# Patient Record
Sex: Female | Born: 1980
Health system: Southern US, Community
[De-identification: ages and names within clinical notes are randomized; demographics above are authoritative.]

## PROBLEM LIST (undated history)

## (undated) ENCOUNTER — Inpatient Hospital Stay (HOSPITAL_COMMUNITY): Payer: Self-pay

## (undated) DIAGNOSIS — J302 Other seasonal allergic rhinitis: Secondary | ICD-10-CM

## (undated) DIAGNOSIS — K219 Gastro-esophageal reflux disease without esophagitis: Secondary | ICD-10-CM

## (undated) HISTORY — PX: FRACTURE SURGERY: SHX138

## (undated) HISTORY — DX: Other seasonal allergic rhinitis: J30.2

---
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
## 2001-02-16 ENCOUNTER — Emergency Department (HOSPITAL_COMMUNITY): Admission: EM | Admit: 2001-02-16 | Discharge: 2001-02-16 | Payer: Self-pay | Admitting: *Deleted

## 2001-07-12 ENCOUNTER — Emergency Department (HOSPITAL_COMMUNITY): Admission: EM | Admit: 2001-07-12 | Discharge: 2001-07-12 | Payer: Self-pay | Admitting: Emergency Medicine

## 2001-09-29 ENCOUNTER — Emergency Department (HOSPITAL_COMMUNITY): Admission: EM | Admit: 2001-09-29 | Discharge: 2001-09-29 | Payer: Self-pay | Admitting: Emergency Medicine

## 2001-10-02 ENCOUNTER — Other Ambulatory Visit: Admission: RE | Admit: 2001-10-02 | Discharge: 2001-10-02 | Payer: Self-pay | Admitting: Family Medicine

## 2001-10-03 ENCOUNTER — Encounter: Admission: RE | Admit: 2001-10-03 | Discharge: 2001-10-03 | Payer: Self-pay | Admitting: Family Medicine

## 2001-11-11 ENCOUNTER — Emergency Department (HOSPITAL_COMMUNITY): Admission: EM | Admit: 2001-11-11 | Discharge: 2001-11-11 | Payer: Self-pay | Admitting: *Deleted

## 2001-11-19 ENCOUNTER — Emergency Department (HOSPITAL_COMMUNITY): Admission: EM | Admit: 2001-11-19 | Discharge: 2001-11-19 | Payer: Self-pay | Admitting: *Deleted

## 2001-11-23 ENCOUNTER — Observation Stay: Admission: EM | Admit: 2001-11-23 | Discharge: 2001-11-24 | Payer: Self-pay | Admitting: Emergency Medicine

## 2001-11-24 ENCOUNTER — Encounter: Payer: Self-pay | Admitting: Emergency Medicine

## 2001-11-27 ENCOUNTER — Encounter: Admission: RE | Admit: 2001-11-27 | Discharge: 2001-11-27 | Payer: Self-pay | Admitting: Family Medicine

## 2002-01-04 ENCOUNTER — Emergency Department (HOSPITAL_COMMUNITY): Admission: EM | Admit: 2002-01-04 | Discharge: 2002-01-04 | Payer: Self-pay | Admitting: Emergency Medicine

## 2002-01-06 ENCOUNTER — Emergency Department (HOSPITAL_COMMUNITY): Admission: EM | Admit: 2002-01-06 | Discharge: 2002-01-06 | Payer: Self-pay | Admitting: Emergency Medicine

## 2002-01-07 ENCOUNTER — Encounter: Payer: Self-pay | Admitting: Emergency Medicine

## 2002-01-07 ENCOUNTER — Observation Stay (HOSPITAL_COMMUNITY): Admission: EM | Admit: 2002-01-07 | Discharge: 2002-01-07 | Payer: Self-pay | Admitting: Emergency Medicine

## 2002-01-29 ENCOUNTER — Encounter: Admission: RE | Admit: 2002-01-29 | Discharge: 2002-01-29 | Payer: Self-pay | Admitting: Family Medicine

## 2002-02-20 ENCOUNTER — Encounter: Admission: RE | Admit: 2002-02-20 | Discharge: 2002-02-20 | Payer: Self-pay | Admitting: Family Medicine

## 2002-04-10 ENCOUNTER — Encounter: Payer: Self-pay | Admitting: Emergency Medicine

## 2002-04-10 ENCOUNTER — Emergency Department (HOSPITAL_COMMUNITY): Admission: EM | Admit: 2002-04-10 | Discharge: 2002-04-11 | Payer: Self-pay | Admitting: Emergency Medicine

## 2002-05-18 ENCOUNTER — Emergency Department (HOSPITAL_COMMUNITY): Admission: EM | Admit: 2002-05-18 | Discharge: 2002-05-18 | Payer: Self-pay | Admitting: Emergency Medicine

## 2002-06-07 ENCOUNTER — Emergency Department (HOSPITAL_COMMUNITY): Admission: EM | Admit: 2002-06-07 | Discharge: 2002-06-08 | Payer: Self-pay | Admitting: Emergency Medicine

## 2002-06-11 ENCOUNTER — Emergency Department (HOSPITAL_COMMUNITY): Admission: EM | Admit: 2002-06-11 | Discharge: 2002-06-11 | Payer: Self-pay | Admitting: Emergency Medicine

## 2002-06-15 ENCOUNTER — Inpatient Hospital Stay (HOSPITAL_COMMUNITY): Admission: EM | Admit: 2002-06-15 | Discharge: 2002-06-16 | Payer: Self-pay | Admitting: Emergency Medicine

## 2002-06-15 ENCOUNTER — Encounter: Payer: Self-pay | Admitting: Emergency Medicine

## 2002-06-22 ENCOUNTER — Encounter: Admission: RE | Admit: 2002-06-22 | Discharge: 2002-06-22 | Payer: Self-pay | Admitting: Sports Medicine

## 2002-07-23 ENCOUNTER — Emergency Department (HOSPITAL_COMMUNITY): Admission: AD | Admit: 2002-07-23 | Discharge: 2002-07-23 | Payer: Self-pay | Admitting: Emergency Medicine

## 2002-08-17 ENCOUNTER — Ambulatory Visit (HOSPITAL_COMMUNITY): Admission: RE | Admit: 2002-08-17 | Discharge: 2002-08-17 | Payer: Self-pay | Admitting: *Deleted

## 2002-08-24 ENCOUNTER — Encounter: Admission: RE | Admit: 2002-08-24 | Discharge: 2002-08-24 | Payer: Self-pay | Admitting: Sports Medicine

## 2002-09-19 ENCOUNTER — Encounter: Payer: Self-pay | Admitting: Emergency Medicine

## 2002-09-19 ENCOUNTER — Inpatient Hospital Stay (HOSPITAL_COMMUNITY): Admission: EM | Admit: 2002-09-19 | Discharge: 2002-09-22 | Payer: Self-pay | Admitting: Emergency Medicine

## 2002-09-20 ENCOUNTER — Encounter: Payer: Self-pay | Admitting: Family Medicine

## 2002-10-06 ENCOUNTER — Encounter: Admission: RE | Admit: 2002-10-06 | Discharge: 2002-10-06 | Payer: Self-pay | Admitting: Family Medicine

## 2002-10-12 ENCOUNTER — Encounter: Admission: RE | Admit: 2002-10-12 | Discharge: 2002-10-12 | Payer: Self-pay | Admitting: Family Medicine

## 2002-10-19 ENCOUNTER — Encounter: Admission: RE | Admit: 2002-10-19 | Discharge: 2002-10-19 | Payer: Self-pay | Admitting: Family Medicine

## 2002-11-07 ENCOUNTER — Inpatient Hospital Stay (HOSPITAL_COMMUNITY): Admission: AD | Admit: 2002-11-07 | Discharge: 2002-11-07 | Payer: Self-pay | Admitting: *Deleted

## 2002-11-16 ENCOUNTER — Encounter (INDEPENDENT_AMBULATORY_CARE_PROVIDER_SITE_OTHER): Payer: Self-pay | Admitting: *Deleted

## 2002-11-16 LAB — CONVERTED CEMR LAB

## 2002-11-18 ENCOUNTER — Encounter: Admission: RE | Admit: 2002-11-18 | Discharge: 2002-11-18 | Payer: Self-pay | Admitting: Family Medicine

## 2002-11-18 ENCOUNTER — Other Ambulatory Visit: Admission: RE | Admit: 2002-11-18 | Discharge: 2002-11-18 | Payer: Self-pay | Admitting: Family Medicine

## 2002-11-20 ENCOUNTER — Encounter: Admission: RE | Admit: 2002-11-20 | Discharge: 2002-11-20 | Payer: Self-pay | Admitting: Sports Medicine

## 2002-12-02 ENCOUNTER — Encounter: Admission: RE | Admit: 2002-12-02 | Discharge: 2002-12-02 | Payer: Self-pay | Admitting: Family Medicine

## 2002-12-16 ENCOUNTER — Encounter: Admission: RE | Admit: 2002-12-16 | Discharge: 2002-12-16 | Payer: Self-pay | Admitting: Family Medicine

## 2002-12-21 ENCOUNTER — Inpatient Hospital Stay (HOSPITAL_COMMUNITY): Admission: AD | Admit: 2002-12-21 | Discharge: 2002-12-22 | Payer: Self-pay | Admitting: Obstetrics and Gynecology

## 2002-12-25 ENCOUNTER — Encounter: Admission: RE | Admit: 2002-12-25 | Discharge: 2002-12-25 | Payer: Self-pay | Admitting: Sports Medicine

## 2003-01-01 ENCOUNTER — Encounter: Admission: RE | Admit: 2003-01-01 | Discharge: 2003-01-01 | Payer: Self-pay | Admitting: Sports Medicine

## 2003-01-05 ENCOUNTER — Encounter: Admission: RE | Admit: 2003-01-05 | Discharge: 2003-01-05 | Payer: Self-pay | Admitting: Sports Medicine

## 2003-01-13 ENCOUNTER — Encounter: Admission: RE | Admit: 2003-01-13 | Discharge: 2003-01-13 | Payer: Self-pay | Admitting: Family Medicine

## 2003-01-14 ENCOUNTER — Inpatient Hospital Stay (HOSPITAL_COMMUNITY): Admission: AD | Admit: 2003-01-14 | Discharge: 2003-01-17 | Payer: Self-pay | Admitting: Obstetrics & Gynecology

## 2003-01-14 ENCOUNTER — Inpatient Hospital Stay (HOSPITAL_COMMUNITY): Admission: AD | Admit: 2003-01-14 | Discharge: 2003-01-14 | Payer: Self-pay | Admitting: Obstetrics and Gynecology

## 2003-01-14 ENCOUNTER — Encounter (INDEPENDENT_AMBULATORY_CARE_PROVIDER_SITE_OTHER): Payer: Self-pay | Admitting: Specialist

## 2003-02-22 ENCOUNTER — Encounter: Admission: RE | Admit: 2003-02-22 | Discharge: 2003-02-22 | Payer: Self-pay | Admitting: Family Medicine

## 2003-04-08 ENCOUNTER — Emergency Department (HOSPITAL_COMMUNITY): Admission: EM | Admit: 2003-04-08 | Discharge: 2003-04-08 | Payer: Self-pay | Admitting: *Deleted

## 2003-05-13 ENCOUNTER — Emergency Department (HOSPITAL_COMMUNITY): Admission: EM | Admit: 2003-05-13 | Discharge: 2003-05-13 | Payer: Self-pay | Admitting: Emergency Medicine

## 2003-05-31 ENCOUNTER — Emergency Department (HOSPITAL_COMMUNITY): Admission: EM | Admit: 2003-05-31 | Discharge: 2003-05-31 | Payer: Self-pay | Admitting: Emergency Medicine

## 2003-06-04 ENCOUNTER — Encounter: Admission: RE | Admit: 2003-06-04 | Discharge: 2003-06-04 | Payer: Self-pay | Admitting: Sports Medicine

## 2003-06-18 ENCOUNTER — Encounter: Admission: RE | Admit: 2003-06-18 | Discharge: 2003-06-18 | Payer: Self-pay | Admitting: Family Medicine

## 2003-08-15 ENCOUNTER — Emergency Department (HOSPITAL_COMMUNITY): Admission: EM | Admit: 2003-08-15 | Discharge: 2003-08-15 | Payer: Self-pay | Admitting: Emergency Medicine

## 2003-08-23 ENCOUNTER — Inpatient Hospital Stay (HOSPITAL_COMMUNITY): Admission: EM | Admit: 2003-08-23 | Discharge: 2003-08-25 | Payer: Self-pay | Admitting: Family Medicine

## 2003-09-06 ENCOUNTER — Emergency Department (HOSPITAL_COMMUNITY): Admission: EM | Admit: 2003-09-06 | Discharge: 2003-09-07 | Payer: Self-pay | Admitting: Emergency Medicine

## 2003-10-11 ENCOUNTER — Ambulatory Visit: Payer: Self-pay | Admitting: Family Medicine

## 2003-10-27 ENCOUNTER — Ambulatory Visit: Payer: Self-pay | Admitting: Family Medicine

## 2003-10-29 ENCOUNTER — Emergency Department (HOSPITAL_COMMUNITY): Admission: EM | Admit: 2003-10-29 | Discharge: 2003-10-30 | Payer: Self-pay

## 2003-10-30 ENCOUNTER — Emergency Department (HOSPITAL_COMMUNITY): Admission: EM | Admit: 2003-10-30 | Discharge: 2003-10-30 | Payer: Self-pay | Admitting: Emergency Medicine

## 2003-12-21 ENCOUNTER — Emergency Department (HOSPITAL_COMMUNITY): Admission: EM | Admit: 2003-12-21 | Discharge: 2003-12-21 | Payer: Self-pay | Admitting: Emergency Medicine

## 2003-12-21 ENCOUNTER — Ambulatory Visit: Payer: Self-pay | Admitting: Family Medicine

## 2003-12-24 ENCOUNTER — Encounter: Payer: Self-pay | Admitting: Emergency Medicine

## 2003-12-25 ENCOUNTER — Inpatient Hospital Stay (HOSPITAL_COMMUNITY): Admission: EM | Admit: 2003-12-25 | Discharge: 2003-12-26 | Payer: Self-pay | Admitting: Family Medicine

## 2004-01-27 ENCOUNTER — Emergency Department (HOSPITAL_COMMUNITY): Admission: EM | Admit: 2004-01-27 | Discharge: 2004-01-27 | Payer: Self-pay | Admitting: Emergency Medicine

## 2004-01-28 ENCOUNTER — Ambulatory Visit: Payer: Self-pay | Admitting: Sports Medicine

## 2004-01-28 ENCOUNTER — Inpatient Hospital Stay (HOSPITAL_COMMUNITY): Admission: AD | Admit: 2004-01-28 | Discharge: 2004-01-30 | Payer: Self-pay | Admitting: Sports Medicine

## 2004-01-28 ENCOUNTER — Ambulatory Visit: Payer: Self-pay | Admitting: Family Medicine

## 2004-02-18 ENCOUNTER — Ambulatory Visit: Payer: Self-pay | Admitting: Sports Medicine

## 2004-03-07 ENCOUNTER — Emergency Department (HOSPITAL_COMMUNITY): Admission: EM | Admit: 2004-03-07 | Discharge: 2004-03-07 | Payer: Self-pay | Admitting: Emergency Medicine

## 2004-03-12 ENCOUNTER — Inpatient Hospital Stay (HOSPITAL_COMMUNITY): Admission: EM | Admit: 2004-03-12 | Discharge: 2004-03-13 | Payer: Self-pay | Admitting: Emergency Medicine

## 2004-04-15 ENCOUNTER — Emergency Department (HOSPITAL_COMMUNITY): Admission: EM | Admit: 2004-04-15 | Discharge: 2004-04-16 | Payer: Self-pay | Admitting: Emergency Medicine

## 2004-05-05 ENCOUNTER — Inpatient Hospital Stay (HOSPITAL_COMMUNITY): Admission: EM | Admit: 2004-05-05 | Discharge: 2004-05-08 | Payer: Self-pay | Admitting: *Deleted

## 2004-05-05 ENCOUNTER — Ambulatory Visit: Payer: Self-pay | Admitting: Family Medicine

## 2004-06-05 ENCOUNTER — Inpatient Hospital Stay (HOSPITAL_COMMUNITY): Admission: EM | Admit: 2004-06-05 | Discharge: 2004-06-06 | Payer: Self-pay | Admitting: Emergency Medicine

## 2004-07-05 ENCOUNTER — Emergency Department (HOSPITAL_COMMUNITY): Admission: EM | Admit: 2004-07-05 | Discharge: 2004-07-05 | Payer: Self-pay | Admitting: Emergency Medicine

## 2004-07-09 ENCOUNTER — Emergency Department (HOSPITAL_COMMUNITY): Admission: EM | Admit: 2004-07-09 | Discharge: 2004-07-09 | Payer: Self-pay | Admitting: Emergency Medicine

## 2004-07-13 ENCOUNTER — Inpatient Hospital Stay (HOSPITAL_COMMUNITY): Admission: EM | Admit: 2004-07-13 | Discharge: 2004-07-15 | Payer: Self-pay | Admitting: Emergency Medicine

## 2004-07-13 ENCOUNTER — Ambulatory Visit: Payer: Self-pay | Admitting: Family Medicine

## 2004-07-28 ENCOUNTER — Ambulatory Visit: Payer: Self-pay | Admitting: Family Medicine

## 2004-08-22 ENCOUNTER — Emergency Department (HOSPITAL_COMMUNITY): Admission: EM | Admit: 2004-08-22 | Discharge: 2004-08-22 | Payer: Self-pay | Admitting: Emergency Medicine

## 2004-08-29 ENCOUNTER — Emergency Department (HOSPITAL_COMMUNITY): Admission: EM | Admit: 2004-08-29 | Discharge: 2004-08-29 | Payer: Self-pay | Admitting: Emergency Medicine

## 2004-08-31 ENCOUNTER — Ambulatory Visit: Payer: Self-pay | Admitting: Family Medicine

## 2004-09-20 ENCOUNTER — Emergency Department (HOSPITAL_COMMUNITY): Admission: EM | Admit: 2004-09-20 | Discharge: 2004-09-20 | Payer: Self-pay | Admitting: Emergency Medicine

## 2004-10-20 ENCOUNTER — Emergency Department (HOSPITAL_COMMUNITY): Admission: EM | Admit: 2004-10-20 | Discharge: 2004-10-21 | Payer: Self-pay | Admitting: Emergency Medicine

## 2004-11-08 ENCOUNTER — Inpatient Hospital Stay (HOSPITAL_COMMUNITY): Admission: EM | Admit: 2004-11-08 | Discharge: 2004-11-11 | Payer: Self-pay | Admitting: Emergency Medicine

## 2005-01-25 ENCOUNTER — Ambulatory Visit: Payer: Self-pay | Admitting: Family Medicine

## 2005-02-04 ENCOUNTER — Emergency Department (HOSPITAL_COMMUNITY): Admission: EM | Admit: 2005-02-04 | Discharge: 2005-02-04 | Payer: Self-pay | Admitting: Emergency Medicine

## 2005-03-21 ENCOUNTER — Emergency Department (HOSPITAL_COMMUNITY): Admission: EM | Admit: 2005-03-21 | Discharge: 2005-03-21 | Payer: Self-pay | Admitting: Emergency Medicine

## 2005-03-27 ENCOUNTER — Emergency Department (HOSPITAL_COMMUNITY): Admission: EM | Admit: 2005-03-27 | Discharge: 2005-03-27 | Payer: Self-pay | Admitting: Emergency Medicine

## 2005-04-28 ENCOUNTER — Inpatient Hospital Stay (HOSPITAL_COMMUNITY): Admission: EM | Admit: 2005-04-28 | Discharge: 2005-04-30 | Payer: Self-pay | Admitting: Internal Medicine

## 2005-04-28 ENCOUNTER — Ambulatory Visit: Payer: Self-pay | Admitting: Family Medicine

## 2005-05-09 ENCOUNTER — Ambulatory Visit: Payer: Self-pay | Admitting: Family Medicine

## 2005-11-02 ENCOUNTER — Ambulatory Visit: Payer: Self-pay | Admitting: Family Medicine

## 2005-12-16 ENCOUNTER — Emergency Department (HOSPITAL_COMMUNITY): Admission: EM | Admit: 2005-12-16 | Discharge: 2005-12-16 | Payer: Self-pay | Admitting: Family Medicine

## 2006-03-14 DIAGNOSIS — K219 Gastro-esophageal reflux disease without esophagitis: Secondary | ICD-10-CM | POA: Insufficient documentation

## 2006-03-14 DIAGNOSIS — J309 Allergic rhinitis, unspecified: Secondary | ICD-10-CM | POA: Insufficient documentation

## 2006-03-15 ENCOUNTER — Encounter (INDEPENDENT_AMBULATORY_CARE_PROVIDER_SITE_OTHER): Payer: Self-pay | Admitting: *Deleted

## 2006-03-17 ENCOUNTER — Emergency Department (HOSPITAL_COMMUNITY): Admission: EM | Admit: 2006-03-17 | Discharge: 2006-03-17 | Payer: Self-pay | Admitting: Emergency Medicine

## 2006-03-19 ENCOUNTER — Encounter (INDEPENDENT_AMBULATORY_CARE_PROVIDER_SITE_OTHER): Payer: Self-pay | Admitting: Family Medicine

## 2006-04-30 ENCOUNTER — Emergency Department (HOSPITAL_COMMUNITY): Admission: EM | Admit: 2006-04-30 | Discharge: 2006-04-30 | Payer: Self-pay | Admitting: Emergency Medicine

## 2006-05-24 ENCOUNTER — Ambulatory Visit: Payer: Self-pay | Admitting: Family Medicine

## 2006-05-24 ENCOUNTER — Encounter: Payer: Self-pay | Admitting: Family Medicine

## 2006-05-24 ENCOUNTER — Other Ambulatory Visit: Admission: RE | Admit: 2006-05-24 | Discharge: 2006-05-24 | Payer: Self-pay | Admitting: Family Medicine

## 2006-05-24 LAB — CONVERTED CEMR LAB: GC Probe Amp, Genital: NEGATIVE

## 2006-06-14 ENCOUNTER — Telehealth: Payer: Self-pay | Admitting: *Deleted

## 2006-10-01 ENCOUNTER — Telehealth: Payer: Self-pay | Admitting: *Deleted

## 2006-10-07 ENCOUNTER — Ambulatory Visit: Payer: Self-pay | Admitting: Family Medicine

## 2006-10-07 ENCOUNTER — Encounter (INDEPENDENT_AMBULATORY_CARE_PROVIDER_SITE_OTHER): Payer: Self-pay | Admitting: Family Medicine

## 2006-10-07 LAB — CONVERTED CEMR LAB
BUN: 9 mg/dL (ref 6–23)
CO2: 22 meq/L (ref 19–32)
Calcium: 9.6 mg/dL (ref 8.4–10.5)
Chloride: 104 meq/L (ref 96–112)
Creatinine, Ser: 0.84 mg/dL (ref 0.40–1.20)
Glucose, Bld: 87 mg/dL (ref 70–99)
HCT: 39 % (ref 36.0–46.0)
Hemoglobin: 12.6 g/dL (ref 12.0–15.0)
RBC: 4.71 M/uL (ref 3.87–5.11)
RDW: 13.4 % (ref 11.5–14.0)
TSH: 1.325 microintl units/mL (ref 0.350–5.50)
WBC: 8.1 10*3/uL (ref 4.0–10.5)

## 2007-01-31 ENCOUNTER — Emergency Department (HOSPITAL_COMMUNITY): Admission: EM | Admit: 2007-01-31 | Discharge: 2007-01-31 | Payer: Self-pay | Admitting: Emergency Medicine

## 2007-02-05 ENCOUNTER — Telehealth: Payer: Self-pay | Admitting: *Deleted

## 2007-02-10 ENCOUNTER — Ambulatory Visit: Payer: Self-pay | Admitting: Family Medicine

## 2007-02-10 LAB — CONVERTED CEMR LAB
Cholesterol, target level: 200 mg/dL
HDL goal, serum: 40 mg/dL
LDL Goal: 160 mg/dL

## 2007-02-25 ENCOUNTER — Encounter (INDEPENDENT_AMBULATORY_CARE_PROVIDER_SITE_OTHER): Payer: Self-pay | Admitting: Family Medicine

## 2007-02-25 ENCOUNTER — Ambulatory Visit: Payer: Self-pay | Admitting: Family Medicine

## 2007-03-20 ENCOUNTER — Ambulatory Visit: Payer: Self-pay | Admitting: Family Medicine

## 2007-03-20 DIAGNOSIS — G43009 Migraine without aura, not intractable, without status migrainosus: Secondary | ICD-10-CM | POA: Insufficient documentation

## 2007-04-21 ENCOUNTER — Ambulatory Visit: Payer: Self-pay | Admitting: Family Medicine

## 2007-05-07 ENCOUNTER — Ambulatory Visit: Payer: Self-pay | Admitting: Internal Medicine

## 2007-05-16 ENCOUNTER — Emergency Department (HOSPITAL_COMMUNITY): Admission: EM | Admit: 2007-05-16 | Discharge: 2007-05-16 | Payer: Self-pay | Admitting: Emergency Medicine

## 2007-05-16 ENCOUNTER — Encounter (INDEPENDENT_AMBULATORY_CARE_PROVIDER_SITE_OTHER): Payer: Self-pay | Admitting: *Deleted

## 2007-05-17 ENCOUNTER — Emergency Department (HOSPITAL_COMMUNITY): Admission: EM | Admit: 2007-05-17 | Discharge: 2007-05-17 | Payer: Self-pay | Admitting: Family Medicine

## 2007-05-28 ENCOUNTER — Ambulatory Visit: Payer: Self-pay | Admitting: Internal Medicine

## 2007-06-03 ENCOUNTER — Ambulatory Visit: Payer: Self-pay | Admitting: Family Medicine

## 2007-07-19 ENCOUNTER — Emergency Department (HOSPITAL_COMMUNITY): Admission: EM | Admit: 2007-07-19 | Discharge: 2007-07-19 | Payer: Self-pay | Admitting: Emergency Medicine

## 2007-07-27 ENCOUNTER — Encounter: Payer: Self-pay | Admitting: Family Medicine

## 2007-07-27 ENCOUNTER — Inpatient Hospital Stay (HOSPITAL_COMMUNITY): Admission: EM | Admit: 2007-07-27 | Discharge: 2007-07-28 | Payer: Self-pay | Admitting: Family Medicine

## 2007-07-27 ENCOUNTER — Ambulatory Visit: Payer: Self-pay | Admitting: Family Medicine

## 2007-08-01 ENCOUNTER — Encounter: Payer: Self-pay | Admitting: *Deleted

## 2007-08-01 ENCOUNTER — Ambulatory Visit: Payer: Self-pay | Admitting: Family Medicine

## 2007-08-27 ENCOUNTER — Encounter: Payer: Self-pay | Admitting: Family Medicine

## 2007-08-27 ENCOUNTER — Encounter: Payer: Self-pay | Admitting: *Deleted

## 2007-08-27 ENCOUNTER — Inpatient Hospital Stay (HOSPITAL_COMMUNITY): Admission: EM | Admit: 2007-08-27 | Discharge: 2007-08-28 | Payer: Self-pay | Admitting: Family Medicine

## 2007-08-27 ENCOUNTER — Ambulatory Visit: Payer: Self-pay | Admitting: Family Medicine

## 2007-08-29 ENCOUNTER — Telehealth: Payer: Self-pay | Admitting: *Deleted

## 2007-09-10 ENCOUNTER — Encounter: Payer: Self-pay | Admitting: Family Medicine

## 2007-09-10 ENCOUNTER — Ambulatory Visit: Payer: Self-pay | Admitting: Family Medicine

## 2007-09-10 LAB — CONVERTED CEMR LAB

## 2007-09-29 ENCOUNTER — Encounter: Payer: Self-pay | Admitting: *Deleted

## 2007-09-29 ENCOUNTER — Ambulatory Visit: Payer: Self-pay | Admitting: Family Medicine

## 2007-10-08 ENCOUNTER — Ambulatory Visit: Payer: Self-pay | Admitting: Family Medicine

## 2007-10-08 ENCOUNTER — Encounter: Admission: RE | Admit: 2007-10-08 | Discharge: 2007-10-08 | Payer: Self-pay | Admitting: Family Medicine

## 2007-10-10 ENCOUNTER — Encounter: Payer: Self-pay | Admitting: *Deleted

## 2008-01-21 ENCOUNTER — Ambulatory Visit: Payer: Self-pay | Admitting: Family Medicine

## 2008-03-08 ENCOUNTER — Telehealth: Payer: Self-pay | Admitting: Family Medicine

## 2008-03-12 ENCOUNTER — Ambulatory Visit: Payer: Self-pay | Admitting: Family Medicine

## 2008-03-12 ENCOUNTER — Encounter: Payer: Self-pay | Admitting: Family Medicine

## 2008-03-12 LAB — CONVERTED CEMR LAB
Antibody Screen: NEGATIVE
Eosinophils Absolute: 0.3 10*3/uL (ref 0.0–0.7)
Eosinophils Relative: 3 % (ref 0–5)
HCT: 36.8 % (ref 36.0–46.0)
Lymphocytes Relative: 27 % (ref 12–46)
Lymphs Abs: 2.4 10*3/uL (ref 0.7–4.0)
MCV: 83.4 fL (ref 78.0–100.0)
Monocytes Relative: 9 % (ref 3–12)
Neutrophils Relative %: 61 % (ref 43–77)
RBC: 4.41 M/uL (ref 3.87–5.11)
Rh Type: POSITIVE
WBC: 9.1 10*3/uL (ref 4.0–10.5)

## 2008-03-13 ENCOUNTER — Encounter: Payer: Self-pay | Admitting: Family Medicine

## 2008-03-19 ENCOUNTER — Encounter: Payer: Self-pay | Admitting: Family Medicine

## 2008-03-19 ENCOUNTER — Ambulatory Visit: Payer: Self-pay | Admitting: Family Medicine

## 2008-03-19 LAB — CONVERTED CEMR LAB

## 2008-03-23 ENCOUNTER — Telehealth: Payer: Self-pay | Admitting: Family Medicine

## 2008-05-04 ENCOUNTER — Telehealth: Payer: Self-pay | Admitting: Family Medicine

## 2008-05-05 ENCOUNTER — Ambulatory Visit: Payer: Self-pay | Admitting: Family Medicine

## 2008-05-06 ENCOUNTER — Telehealth: Payer: Self-pay | Admitting: *Deleted

## 2008-07-21 ENCOUNTER — Encounter: Payer: Self-pay | Admitting: *Deleted

## 2008-07-21 ENCOUNTER — Emergency Department (HOSPITAL_COMMUNITY): Admission: EM | Admit: 2008-07-21 | Discharge: 2008-07-21 | Payer: Self-pay | Admitting: Family Medicine

## 2008-09-09 ENCOUNTER — Telehealth: Payer: Self-pay | Admitting: *Deleted

## 2008-09-10 ENCOUNTER — Ambulatory Visit: Payer: Self-pay | Admitting: Family Medicine

## 2008-09-10 DIAGNOSIS — K5909 Other constipation: Secondary | ICD-10-CM | POA: Insufficient documentation

## 2008-10-15 ENCOUNTER — Emergency Department (HOSPITAL_COMMUNITY): Admission: EM | Admit: 2008-10-15 | Discharge: 2008-10-15 | Payer: Self-pay | Admitting: Emergency Medicine

## 2008-12-07 ENCOUNTER — Encounter: Payer: Self-pay | Admitting: Family Medicine

## 2008-12-07 ENCOUNTER — Ambulatory Visit: Payer: Self-pay | Admitting: Family Medicine

## 2008-12-07 LAB — CONVERTED CEMR LAB
GC Probe Amp, Genital: NEGATIVE
Whiff Test: POSITIVE

## 2008-12-08 ENCOUNTER — Encounter: Payer: Self-pay | Admitting: Family Medicine

## 2009-02-16 ENCOUNTER — Ambulatory Visit: Payer: Self-pay | Admitting: Family Medicine

## 2009-04-21 ENCOUNTER — Encounter: Payer: Self-pay | Admitting: Family Medicine

## 2009-04-25 ENCOUNTER — Telehealth: Payer: Self-pay | Admitting: *Deleted

## 2009-05-24 ENCOUNTER — Ambulatory Visit: Payer: Self-pay | Admitting: Family Medicine

## 2009-05-24 LAB — CONVERTED CEMR LAB
Chlamydia, DNA Probe: POSITIVE — AB
Whiff Test: POSITIVE

## 2009-05-25 ENCOUNTER — Telehealth: Payer: Self-pay | Admitting: Family Medicine

## 2009-05-26 ENCOUNTER — Ambulatory Visit: Payer: Self-pay | Admitting: Family Medicine

## 2009-05-26 LAB — CONVERTED CEMR LAB

## 2009-05-27 ENCOUNTER — Telehealth: Payer: Self-pay | Admitting: Family Medicine

## 2009-06-15 ENCOUNTER — Telehealth: Payer: Self-pay | Admitting: *Deleted

## 2009-08-22 ENCOUNTER — Telehealth: Payer: Self-pay | Admitting: *Deleted

## 2009-08-22 ENCOUNTER — Encounter: Payer: Self-pay | Admitting: Family Medicine

## 2009-08-22 ENCOUNTER — Ambulatory Visit: Payer: Self-pay | Admitting: Family Medicine

## 2009-08-22 LAB — CONVERTED CEMR LAB
Chlamydia, DNA Probe: NEGATIVE
GC Probe Amp, Genital: NEGATIVE

## 2009-08-23 ENCOUNTER — Telehealth: Payer: Self-pay | Admitting: *Deleted

## 2009-10-26 ENCOUNTER — Encounter: Payer: Self-pay | Admitting: Family Medicine

## 2009-11-21 ENCOUNTER — Ambulatory Visit: Payer: Self-pay | Admitting: Family Medicine

## 2009-12-19 ENCOUNTER — Telehealth: Payer: Self-pay | Admitting: *Deleted

## 2009-12-28 ENCOUNTER — Ambulatory Visit: Payer: Self-pay | Admitting: Family Medicine

## 2009-12-28 DIAGNOSIS — J4540 Moderate persistent asthma, uncomplicated: Secondary | ICD-10-CM | POA: Insufficient documentation

## 2010-01-05 ENCOUNTER — Encounter: Payer: Self-pay | Admitting: Family Medicine

## 2010-01-27 ENCOUNTER — Ambulatory Visit: Admission: RE | Admit: 2010-01-27 | Discharge: 2010-01-27 | Payer: Self-pay | Source: Home / Self Care

## 2010-01-27 DIAGNOSIS — B370 Candidal stomatitis: Secondary | ICD-10-CM | POA: Insufficient documentation

## 2010-02-14 NOTE — Progress Notes (Signed)
   Pt got cut off with Dr. Mauricio Po earlier and she wants to let him know that she wants to come in tomorrow to be tested for HIV and RPR. She also wants to go ahead and get the antibiotic injection tomorrow. I told her that would be fine. I will pass this info along to [facility] staff and lab staff. Also would suggest working her into nurse clinic so she can get her injection. Pt relieved.

## 2010-02-14 NOTE — Assessment & Plan Note (Signed)
Summary: numbness in left hand,tcb   Vital Signs:  Patient profile:   30 year old female Height:      65.5 inches Weight:      219 pounds BMI:     36.02 Temp:     97.7 degrees F oral Pulse rate:   60 / minute BP sitting:   109 / 74  (right arm) Cuff size:   large  Vitals Entered By: Tessie Fass CMA (February 16, 2009 11:17 AM) CC: numbness in left wrist and hand x 2 days Is Patient Diabetic? No Pain Assessment Patient in pain? yes     Location: shoulder Intensity: 7   Primary Care Provider:  Eustaquio Boyden  MD  CC:  numbness in left wrist and hand x 2 days.  History of Present Illness: CC: left arm and shoulder/neck pain  5d history of left neck burning pain, worse with movement.  No inciting injury or trauma.  Then 2d ago started having left hand weakness and numbness/ paresthesias.  Tried percocet which helped but numbness remained.  Shoulder pain worse with activity and at night.  never had neck issues in past.  numbness described as dorsal hand from 1st MCP to mid forearm  Denies h/o cancer, fever, chills.  Habits & Providers  Alcohol-Tobacco-Diet     Tobacco Status: never  Current Medications (verified): 1)  Pulmicort 1 Mg/88ml Susp (Budesonide) .... One Neb Two Times A Day 2)  Proair Hfa 108 (90 Base) Mcg/act  Aers (Albuterol Sulfate) .Marland Kitchen.. 1-2 Puff Q 4 With Spacer As Needed For Wheeze 3)  Loestrin 24 Fe 1-20 Mg-Mcg Tabs (Norethin Ace-Eth Estrad-Fe) .... Take One Daily As Directed 4)  Miralax  Powd (Polyethylene Glycol 3350) .Marland KitchenMarland KitchenMarland Kitchen 17 G Dissolved in 8 Ozs of Water.  Take 4 Times A Day Until Large Bm; Then Start Taking Two Times A Day 5)  Metronidazole 500 Mg Tabs (Metronidazole) .... 4 Pills At One Time 6)  Prednisone 50 Mg Tabs (Prednisone) .... One By Mouth Daily X 7 Days 7)  Naprosyn 500 Mg Tabs (Naproxen) .... Take One By Mouth Two Times A Day X 10 Days Then As Needed Pain  Allergies (verified): 1)  ! * Avelox 2)  * Shellfish 3)  * Cat Dog Dustmiite  Cockroach  Past History:  Past medical, surgical, family and social histories (including risk factors) reviewed for relevance to current acute and chronic problems.  Past Medical History: Reviewed history from 05/05/2008 and no changes required. Asthma - Has had > 10 exacerbations with some hospital admissions. Allergic rhinitis GERD [id number] (EAB March 2010) hx abn pap, repeat wnl  Past Surgical History: Reviewed history from 08/01/2007 and no changes required. LTCS for NRFHT at term - 12/16/2002,  norplant placed in [place]--4/99 - 07/27/1999, norplant removal 10/02 - 10/29/2000,  Peak flow--best 420  Family History: Reviewed history from 08/27/2007 and no changes required. Asthma in her father, aunt, and grandmother.  Eczema in her son.  Multiple relatives with hypertension and diabetes mellitus. Family history is negative for coronary artery disease, but positive for gynecologic cancer.   Social History: Reviewed history from 01/21/2008 and no changes required. Unemployed.  Lives with 55 year old son.  No tobacco, etoh, drug use. Dad rarely involved No smoking in house--central air and electric heat--carpet.;  only smoked 2 years, 2002-2004 occasional passive smoke exposure  Physical Exam  General:  alert and overweight-appearing.   Neck:  + spurling bilaterally Msk:  Neck - tender to palpation midline cervical  neck. Left arm - decreased ROM at shoulder, to 90 degrees flexion and abduction, about 70 degrees extension posteriorly.  + pain with forced movement.  pain reproduced characterized as burning pain that starts in mid neck.  Sensation diminished to cold, sharp/dull perception, and pain left hand from 2nd MCP dorsally to mid forearm laterally.  + brisk L brachioradialis DTR, o/w normal reflexes.  + slight decrease in strenght (4+/5) forced extension of wrist and triceps strength (C6-7) Right arm - WNL. Pulses:  R and L carotid,radial,femoral,dorsalis pedis and posterior  tibial pulses are full and equal bilaterally   Impression & Recommendations:  Problem # 1:  CERVICALGIA (ICD-723.1)  neurological exam consistent with mild cervical impingement.  Treat as such with course of steroids and then NSAIDs.  Disucssed red flags to return, but how most of these issues are transient and improve with time.  RTC 2 wks for recheck.  Precepted with Dr. Deirdre Priest.  Pt asthmatic, has taken steroids in past.  Also gave cervical collar script to fill for further neck support while acute exac improves.  Her updated medication list for this problem includes:    Naprosyn 500 Mg Tabs (Naproxen) .Marland Kitchen... Take one by mouth two times a day x 10 days then as needed pain  Orders: [facility]- Est Level  3 ([address]109)  Complete Medication List: 1)  Pulmicort 1 Mg/68ml Susp (Budesonide) .... One neb two times a day 2)  Proair Hfa 108 (90 Base) Mcg/act Aers (Albuterol sulfate) .Marland Kitchen.. 1-2 puff q 4 with spacer as needed for wheeze 3)  Loestrin 24 Fe 1-20 Mg-mcg Tabs (Norethin ace-eth estrad-fe) .... Take one daily as directed 4)  Miralax Powd (Polyethylene glycol 3350) .Marland KitchenMarland KitchenMarland Kitchen 17 g dissolved in 8 ozs of water.  take 4 times a day until large bm; then start taking two times a day 5)  Metronidazole 500 Mg Tabs (Metronidazole) .... 4 pills at one time 6)  Prednisone 50 Mg Tabs (Prednisone) .... One by mouth daily x 7 days 7)  Naprosyn 500 Mg Tabs (Naproxen) .... Take one by mouth two times a day x 10 days then as needed pain  Patient Instructions: 1)  Please return in 7-10 days. 2)  Sounds like you have some cervical nerve root impingement - this usually gets better with time.  We will treat you with steroids and anit inflammatory meds and a cervical collar. 3)  Return sooner if you have fevers or loss of function of one arm or spreading pain/numbness. Prescriptions: NAPROSYN 500 MG TABS (NAPROXEN) take one by mouth two times a day x 10 days then as needed pain  #30 x 0   Entered and Authorized by:   Eustaquio Boyden  MD   Signed by:   Eustaquio Boyden  MD on 02/16/2009   Method used:   Electronically to        [facility] [address] (retail)       [phone]       [place], [place]  [address]       Ph: [phone]       Fax: [phone]   RxID:   [id number] PREDNISONE 50 MG TABS (PREDNISONE) one by mouth daily x 7 days  #7 x 0   Entered and Authorized by:   Eustaquio Boyden  MD   Signed by:   Eustaquio Boyden  MD on 02/16/2009   Method used:   Electronically to  [facility] [address] (retail)       [phone]       [place], [place]  [address]       Ph: [phone]       Fax: [phone]   RxID:   [id number]
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
## 2010-02-14 NOTE — Miscellaneous (Signed)
Summary: budesonide  Clinical Lists Changes [facility] [phone]) called to tell us that the budesonide is not on their formulary. pt got it from [place]. They will be getting it 0.25 in the future. fyi to [facility] RN  April 21, 2009 12:29 PM  [facility] to give scripts. Eustaquio Boyden  MD  April 21, 2009 5:00 PM   Medications: Changed medication from PULMICORT 1 MG/2ML SUSP (BUDESONIDE) one neb two times a day to PULMICORT 0.25 MG/2ML SUSP (BUDESONIDE) one inh two times a day - Signed Rx of PULMICORT 0.25 MG/2ML SUSP (BUDESONIDE) one inh two times a day;  #60 x 3;  Signed;  Entered by: Eustaquio Boyden  MD;  Authorized by: Eustaquio Boyden  MD;  Method used: Historical    Prescriptions: PULMICORT 0.25 MG/2ML SUSP (BUDESONIDE) one inh two times a day  #60 x 3   Entered and Authorized by:   Eustaquio Boyden  MD   Signed by:   Eustaquio Boyden  MD on 04/21/2009   Method used:   Historical   RxID:   [id number]

## 2010-02-14 NOTE — Progress Notes (Signed)
Summary: Form  Phone Note Call from Patient Call back at Home Phone [phone]   Reason for Call: Talk to Nurse Summary of Call: pt has a form for [place] to fill out for school, it just needs MD signature stating that she is disabled, need to know if pt needs appt before dropping off.  Initial call taken by: Knox Royalty,  December 19, 2009 9:21 AM  Follow-up for Phone Call        called pt told her to schedule appt to have form filled out. Follow-up by: Tessie Fass CMA,  December 19, 2009 2:25 PM

## 2010-02-14 NOTE — Progress Notes (Signed)
Summary: test results  Phone Note Call from Patient Call back at Home Phone [phone]   Caller: Patient Summary of Call: wants to know results of test from yesterday Initial call taken by: De Nurse,  August 23, 2009 11:08 AM  Follow-up for Phone Call        told her pap is not back yet. to call back on friday Follow-up by: Golden Circle RN,  August 23, 2009 11:13 AM

## 2010-02-14 NOTE — Progress Notes (Signed)
Summary: meds prb  Phone Note Call from Patient Call back at [phone]   Caller: Patient Summary of Call: having trouble getting her Pulmacort meds and needs to talk to nurse Initial call taken by: De Nurse,  April 25, 2009 10:32 AM  Follow-up for Phone Call        [facility]. she had been getting it free from them. went to [facility] & it was almost $200. could not pay for it. states she is out & has been sick all weekend. asked if there is something less costly that she can be prescribed Follow-up by: Golden Circle RN,  April 25, 2009 10:36 AM  Additional Follow-up for Phone Call Additional follow up Details #1::        no pulmicort 1mg  nebs anymore, but i filled out authorization to give pulmicort 0.25mg  nebs, faxed to [facility] 04/21/2009 (see below document).  [facility] said they did have this available.   Additional Follow-up by: Eustaquio Boyden  MD,  April 25, 2009 11:15 AM    Additional Follow-up for Phone Call Additional follow up Details #2::    she last spoke with them Saturday. she will call them now & see if the rx went thru  Follow-up by: Golden Circle RN,  April 25, 2009 11:22 AM  Additional Follow-up for Phone Call Additional follow up Details #3:: Details for Additional Follow-up Action Taken: Pt called today saying she was able to get medication from [facility]. Additional Follow-up by: Clydell Hakim,  April 25, 2009 11:35 AM

## 2010-02-14 NOTE — Progress Notes (Signed)
  Phone Note Outgoing Call   Call placed by: Paula Compton MD,  May 25, 2009 5:03 PM Call placed to: Patient Summary of Call: Called patient to discuss cervical culture positive for Chlamydia, negative for gonorrhea.  She is counseled to come into [facility] for an observed-administration of azithromycin 1gram by mouth x1.  She is counseled that her partner needs to be treated as well.  She agrees to come in tomorrow for the azithromycin 1g.  Additionally, I recommended to her to have HIV and RPR screening performed.  Upon delivering this recommendation, the phone line went dead and return call was not connected.  Initial call taken by: Paula Compton MD,  May 25, 2009 5:05 PM  New Problems: CHLAMYDTRACHOMATIS INFECTION LOWER GU SITES (ICD-099.53)   New Problems: CHLAMYDTRACHOMATIS INFECTION LOWER GU SITES (ICD-099.53)
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
## 2010-02-14 NOTE — Progress Notes (Signed)
  Phone Note Outgoing Call   Call placed by: Paula Compton MD,  May 27, 2009 8:32 AM Summary of Call: Called at [phone] to report negative HIV and RPR results.  Left voice message to call for lab results.  I wish to inform her that both the HIV and RPR are negative. Initial call taken by: Paula Compton MD,  May 27, 2009 8:32 AM  Follow-up for Phone Call        pt returned call Follow-up by: De Nurse,  May 27, 2009 8:55 AM  Additional Follow-up for Phone Call Additional follow up Details #1::        gave her the results & urged consistant condom use Additional Follow-up by: Golden Circle RN,  May 27, 2009 8:56 AM

## 2010-02-14 NOTE — Assessment & Plan Note (Signed)
Summary: asthma,df   Vital Signs:  Patient profile:   30 year old female Weight:      228.8 pounds O2 Sat:      98 % on Room air Temp:     98.8 degrees F oral Pulse rate:   74 / minute BP sitting:   118 / 68  (right arm)  Vitals Entered By: Renato Battles slade,cma  O2 Flow:  Room air CC: asthma flare up x 1 week.  Is Patient Diabetic? No Pain Assessment Patient in pain? no        Primary Care Provider:  . [facility]  CC:  asthma flare up x 1 week. Marland Kitchen  History of Present Illness: 4 YOF w/ PMHx/o asthma here for evalaution of recent hospitalization for asthma flare. Pt was hospitalized in [place] for reported asthma flare. Was in hospitalized 2-3 days per pt. Was given rx for albuterol and prednisone. Pt has not filled prednisone rx. Pt reports persistent cough. Intermittently productive per pt.  No fevers, increased WOB. Still with persistent cough and intermittent dyspnea.   Habits & Providers  Alcohol-Tobacco-Diet     Tobacco Status: never  Allergies: 1)  ! * Avelox 2)  * Shellfish 3)  * Cat Dog Dustmiite Cockroach  Social History: Smoking Status:  never  Physical Exam  General:  alert and overweight-appearing.  NAD Head:  normocephalic and atraumatic.   Nose:  no external deformity, no rhinorrhea  Mouth:  good dentition.   Neck:  supple and full ROM.   Lungs:  Diffuse end expiratory wheezes; most prominent in upper apices Heart:  RRR, no rubs, gallops, murmurs Abdomen:  S/NT/ND/+ bowel sounds    Impression & Recommendations:  Problem # 1:  ASTHMA, PERSISTENT (ICD-493.90) Pt with likley partially treated asthma flare. Will prescribe 5 day course of prednisone as well as albutrol HFA. Will also obtian CXR to rule out PNA as pt reports not recieving CXR while hospitialized? Pt instructed to followup at end of week for improvement in sxs. Also rx'd advair for long term management. Instructed to followup with pulmonologist in next 3-6 months as she hasnt been seen in >2  years.  Resp red flags reviiewed. Pt agreeable to plan.  Her updated medication list for this problem includes:    Pulmicort 0.25 Mg/43ml Susp (Budesonide) ..... One inh two times a day, qs    Proair Hfa 108 (90 Base) Mcg/act Aers (Albuterol sulfate) .Marland Kitchen... 1-2 puff q 4 with spacer as needed for wheeze    Proventil Hfa 108 (90 Base) Mcg/act Aers (Albuterol sulfate) .Marland Kitchen... 1-2 puffs inhaled every 4-6 hours as needed for shortness of breath    Prednisone 50 Mg Tabs (Prednisone) .Marland Kitchen... 1 tab daily x 5 days    Advair Diskus 250-50 Mcg/dose Aepb (Fluticasone-salmeterol) .Marland Kitchen... 1 puff twice daily  Orders: CXR- 2view (CXR)  Complete Medication List: 1)  Pulmicort 0.25 Mg/78ml Susp (Budesonide) .... One inh two times a day, qs 2)  Proair Hfa 108 (90 Base) Mcg/act Aers (Albuterol sulfate) .Marland Kitchen.. 1-2 puff q 4 with spacer as needed for wheeze 3)  Loestrin 24 Fe 1-20 Mg-mcg Tabs (Norethin ace-eth estrad-fe) .... Take one daily as directed 4)  Miralax Powd (Polyethylene glycol 3350) .Marland KitchenMarland KitchenMarland Kitchen 17 g dissolved in 8 ozs of water.  take 4 times a day until large bm; then start taking two times a day 5)  Plan B One-step 1.5 Mg Tabs (Levonorgestrel) .... Disp 1 pk and sig as directed 6)  Proventil Hfa 108 (90  Base) Mcg/act Aers (Albuterol sulfate) .Marland Kitchen.. 1-2 puffs inhaled every 4-6 hours as needed for shortness of breath 7)  Prednisone 50 Mg Tabs (Prednisone) .Marland Kitchen.. 1 tab daily x 5 days 8)  Advair Diskus 250-50 Mcg/dose Aepb (Fluticasone-salmeterol) .Marland Kitchen.. 1 puff twice daily  Patient Instructions: 1)  It was good to meet you today 2)  I will prescibe you albuterol and prednisone for your asthma 3)  I will also send you for a chest x-ray to rule out pneumonia 4)  Use the medication as prescribed  5)  Come back on 11/25/09 for reevlauation of your symptoms 6)  Otherwise call for any questions, 7)  God Bless,  8)  Doree Albee MD  Prescriptions: ADVAIR DISKUS 250-50 MCG/DOSE AEPB (FLUTICASONE-SALMETEROL) 1 puff twice daily   #1 diskus x 6   Entered and Authorized by:   Doree Albee MD   Signed by:   Doree Albee MD on 11/21/2009   Method used:   Electronically to        [facility] [address] (retail)       [phone]       [place], [place]  [address]       Ph: [phone]       Fax: [phone]   RxID:   [id number] PREDNISONE 50 MG TABS (PREDNISONE) 1 tab daily x 5 days  #5 x 0   Entered and Authorized by:   Doree Albee MD   Signed by:   Doree Albee MD on 11/21/2009   Method used:   Electronically to        [facility] [address] (retail)       [phone]       [place], [place]  [address]       Ph: [phone]       Fax: [phone]   RxID:   [id number] PROVENTIL HFA 108 (90 BASE) MCG/ACT AERS (ALBUTEROL SULFATE) 1-2 puffs inhaled every 4-6 hours as needed for shortness of breath  #1 inhaler x 6   Entered and Authorized by:   Doree Albee MD   Signed by:   Doree Albee MD on 11/21/2009   Method used:   Electronically to        [facility] [address] (retail)       [phone]       [place], [place]  [address]       Ph: [phone]       Fax: [phone]   RxID:   [id number]    Orders Added: 1)  CXR- 2view [CXR]  Appended Document: asthma,df    Clinical Lists Changes  Medications: Rx of PROAIR HFA 108 (90 BASE) MCG/ACT  AERS (ALBUTEROL SULFATE) 1-2 puff q 4 with spacer as needed for wheeze;  #1 x 1;  Signed;  Entered by: Doree Albee MD;  Authorized by: Doree Albee MD;  Method used: Electronically to [facility] [phone] [place], [place], [place]  [address], Ph: [phone], Fax: [phone] Orders: Added new Test order of [facility]- Est Level  3 ([address]213) - Signed    Prescriptions: PROAIR HFA 108 (90 BASE) MCG/ACT  AERS (ALBUTEROL SULFATE) 1-2 puff q 4 with spacer as needed for wheeze  #1 x 1   Entered and Authorized by:    Doree Albee MD   Signed by:  Doree Albee MD on 11/24/2009   Method used:   Electronically to        [facility] [address] (retail)       [phone]       [place], [place]  [address]       Ph: [phone]       Fax: [phone]   RxID:   [id number]

## 2010-02-14 NOTE — Miscellaneous (Signed)
  Clinical Lists Changes  Problems: Changed problem from ASTHMA, PERSISTENT, SEVERE (ICD-493.90) to ASTHMA, PERSISTENT (ICD-493.90) 

## 2010-02-14 NOTE — Progress Notes (Signed)
Summary: phn msg  Phone Note Call from Patient Call back at Home Phone [phone]   Caller: Patient Summary of Call: wants to know if she needs to come in for OV for the morning after pills Initial call taken by: De Nurse,  June 15, 2009 10:21 AM  Follow-up for Phone Call        uses [facility] [address]. wants A script sent so medicaid will pay for it. will call her when done Follow-up by: Golden Circle RN,  June 15, 2009 10:54 AM  Additional Follow-up for Phone Call Additional follow up Details #1::        DEAR [facility] it has been called in Thanks!  Denny Levy MD  June 15, 2009 4:28 PM     Additional Follow-up for Phone Call Additional follow up Details #2::    lm that rx was at pharmacy Follow-up by: Golden Circle RN,  June 15, 2009 4:41 PM  New/Updated Medications: PLAN B ONE-STEP 1.5 MG TABS (LEVONORGESTREL) disp 1 pk and sig as directed Prescriptions: PLAN B ONE-STEP 1.5 MG TABS (LEVONORGESTREL) disp 1 pk and sig as directed  #1pk x 0   Entered and Authorized by:   Denny Levy MD   Signed by:   Denny Levy MD on 06/15/2009   Method used:   Electronically to        [facility] [address] (retail)       [phone]       [place], [place]  [address]       Ph: [phone]       Fax: [phone]   RxID:   [id number]

## 2010-02-14 NOTE — Assessment & Plan Note (Signed)
Summary: female problem,tcb   Vital Signs:  Patient profile:   30 year old female Height:      65.5 inches Weight:      225 pounds BMI:     37.01 O2 Sat:      98 % on Room air Temp:     98.6 degrees F oral Pulse rate:   83 / minute Resp:     16 per minute BP sitting:   110 / 80  (left arm)  Vitals Entered By: Arlyss Repress CMA, (May 24, 2009 11:13 AM)  O2 Flow:  Room air CC: mid chest pain with breathing x 2-3d. sore throat x 2 day.vag d/c x 1 week. no itching or smell to it. LMP 05-03-09 Is Patient Diabetic? Yes Pain Assessment Patient in pain? no        Primary Care Provider:  Eustaquio Boyden  MD  CC:  mid chest pain with breathing x 2-3d. sore throat x 2 day.vag d/c x 1 week. no itching or smell to it. LMP 05-03-09.  History of Present Illness: Patient presents for acute visit, to discuss two items: 1) Development of vaginal discharge which is not accompanied by pruritus, pain, or foul odor.  Similar in character to prior epsiode in Nov 2010, when she was diagnosed with trich and BV and treated wtih one-time flagyl and resolved clinically.  She reports taht she has resumed sexual activity with a former boyfriend, who was also her partner at the time of the prior episode.  She initially declines recommendation for cervical cultlure for other STIs, then acquiesces to this recommendation today.   LMP May 03, 2009.  2) Reports that she has had a flare of her asthma, for which she uses Pulmicort via nebulizer, and rescue inhaler Albuterol HFA.  Usually she does not need her HFA, but has been using 2 to 3 times daily for the past week.  Denies fevers or chills.  Has had rhinorrhea and cold sxs, which frequently trigger her asthma.  Also, took on a cleaning project that exposed her to dog and cat dander, dust, and noxious cleaning supplies, all of which trigger her asthma.  In the past she has been hospitalized for her asthma but never intubated.    Habits &  Providers  Alcohol-Tobacco-Diet     Tobacco Status: quit  Current Medications (verified): 1)  Pulmicort 0.25 Mg/70ml Susp (Budesonide) .... One Inh Two Times A Day 2)  Proair Hfa 108 (90 Base) Mcg/act  Aers (Albuterol Sulfate) .Marland Kitchen.. 1-2 Puff Q 4 With Spacer As Needed For Wheeze 3)  Loestrin 24 Fe 1-20 Mg-Mcg Tabs (Norethin Ace-Eth Estrad-Fe) .... Take One Daily As Directed 4)  Miralax  Powd (Polyethylene Glycol 3350) .Marland KitchenMarland KitchenMarland Kitchen 17 G Dissolved in 8 Ozs of Water.  Take 4 Times A Day Until Large Bm; Then Start Taking Two Times A Day 5)  Metronidazole 500 Mg Tabs (Metronidazole) .... 4 Pills At One Time 6)  Prednisone 50 Mg Tabs (Prednisone) .... One By Mouth Daily X 7 Days 7)  Naprosyn 500 Mg Tabs (Naproxen) .... Take One By Mouth Two Times A Day X 10 Days Then As Needed Pain 8)  Prednisone 20 Mg Tabs (Prednisone) .... 2 Tabs By Mouth Once Daily For 10 Days 9)  Mucinex 600 Mg Xr12h-Tab (Guaifenesin) .Marland Kitchen.. 1 By Mouth Two Times A Day  Allergies (verified): 1)  ! * Avelox 2)  * Shellfish 3)  * Cat Dog Dustmiite Cockroach  Social History: Smoking Status:  quit  Physical Exam  General:  Generally well appeaing, no visible respiratory distress Mouth:  moist mucus  membranes, clear oropharynx Neck:  Neck supple. No anterior cervical adenopathy. Lungs:  No rales audible.  Diffuse scattered wheezes throughout lung fields, heard mostly on exhalation Heart:  Regular S1S2 Abdomen:  Soft, nontender, nondistended.  Genitalia:  Vaginal mucosa pink and moist, no visible lesions.  Cervical os closed, with copious wihite discharge. No punctate hemorrhagic lesions, not friable.  No adnexal masses or tenderness on bimanual exam; no CMT.   Impression & Recommendations:  Problem # 1:  TRICHOMONAL VAGINITIS (ICD-131.01)  Patient with another case of trichomonas.  Is with a sexual partner, whom she had been with formerly, then was separated from, and now has resumed sex with him in the past three weeks.  She had  a previous case of trichomonas and was treated and cured clinically.  She does not know if her current sex partner has a doctor, she is counseled that he must be treated and she agrees to tell him the importance of this.  He may come to our office for care, or to [facility] for treatment at STI clinic.  She is to refrain from sexual activity with ANYONE until this is cleared up.  Other STI screening (such as HIV and RPR) discussed.  Cervical cultures sent today. Risk of unintended pregnancy addressed.  Orders: [facility]- Est  Level 4 (84132)  Problem # 2:  VAGINITIS, BACTERIAL (ICD-616.10)  Found on wet mount.  Treatment same as for trich (above). Her updated medication list for this problem includes:    Metronidazole 500 Mg Tabs (Metronidazole) .Marland KitchenMarland KitchenMarland KitchenMarland Kitchen 4 pills at one time  Orders: [facility]- Est  Level 4 (44010)  Problem # 3:  ASTHMA, PERSISTENT, SEVERE (ICD-493.90) Has reported some increase in need for rescue albulterol HFA to 2-3x/day for the past week, recent exposure to cleaning supplies and animal dander, smoke, and has recently had a URI as well.  Is not in visible distress but does have some wheeze on exam.  She expects to return to her former (higher) dose of Pulmicort via neb later this month.  I am loathe to change this at this time.  Will instead give a short-course of oral prednisone (last dose systemic steroid in February).   Discussed reasons to seek emergent attention.  Her updated medication list for this problem includes:    Pulmicort 0.25 Mg/71ml Susp (Budesonide) ..... One inh two times a day    Proair Hfa 108 (90 Base) Mcg/act Aers (Albuterol sulfate) .Marland Kitchen... 1-2 puff q 4 with spacer as needed for wheeze    Prednisone 50 Mg Tabs (Prednisone) ..... One by mouth daily x 7 days    Prednisone 20 Mg Tabs (Prednisone) .Marland Kitchen... 2 tabs by mouth once daily for 10 days  Orders: Pulse Oximetry- [facility] (94760) [facility]- Est  Level 4 (99214)  Complete Medication List: 1)  Pulmicort 0.25 Mg/76ml  Susp (Budesonide) .... One inh two times a day 2)  Proair Hfa 108 (90 Base) Mcg/act Aers (Albuterol sulfate) .Marland Kitchen.. 1-2 puff q 4 with spacer as needed for wheeze 3)  Loestrin 24 Fe 1-20 Mg-mcg Tabs (Norethin ace-eth estrad-fe) .... Take one daily as directed 4)  Miralax Powd (Polyethylene glycol 3350) .Marland KitchenMarland KitchenMarland Kitchen 17 g dissolved in 8 ozs of water.  take 4 times a day until large bm; then start taking two times a day 5)  Metronidazole 500 Mg Tabs (Metronidazole) .... 4 pills at one time 6)  Prednisone  50 Mg Tabs (Prednisone) .... One by mouth daily x 7 days 7)  Naprosyn 500 Mg Tabs (Naproxen) .... Take one by mouth two times a day x 10 days then as needed pain 8)  Prednisone 20 Mg Tabs (Prednisone) .... 2 tabs by mouth once daily for 10 days 9)  Mucinex 600 Mg Xr12h-tab (Guaifenesin) .Marland Kitchen.. 1 by mouth two times a day  [id number] GC/Chlamydia-[facility] (87591/87491)  Patient Instructions: 1)  It was a pleasure to see you today.  Your tests in the office show that you have bacterial vaginosis as well as trichomonas.  They are both treated with the same antibiotic.  2)  IT IS VERY IMPORTANT THAT ANY SEXUAL CONTACTS OF YOURS BE TREATED FOR TRICHOMONAS>  IF A PATIENT HERE, I WOULD BE HAPPY TO TREAT.  IF NOT, MAY SEE PERSONAL PHYSICIAN OR GO TO THE HEALTH DEPARTMENT STD CLINIC. 3)  As with other STDs, it is important to screen or others such as HIV and syphilis, which are blood tests.  4)  I will call you at [phone] with the rsults of the culture. Prescriptions: MUCINEX 600 MG XR12H-TAB (GUAIFENESIN) 1 by mouth two times a day  #20 x 1   Entered and Authorized by:   Paula Compton MD   Signed by:   Paula Compton MD on 05/24/2009   Method used:   Electronically to        [facility] [address] (retail)       [phone]       [place], [place]  [address]       Ph: [phone]       Fax: [phone]   RxID:   [id number] PREDNISONE 20 MG TABS  (PREDNISONE) 2 tabs by mouth once daily for 10 days  #20 x 0   Entered and Authorized by:   Paula Compton MD   Signed by:   Paula Compton MD on 05/24/2009   Method used:   Electronically to        [facility] [address] (retail)       [phone]       [place], [place]  [address]       Ph: [phone]       Fax: [phone]   RxID:   [id number] METRONIDAZOLE 500 MG TABS (METRONIDAZOLE) 4 pills at one time  #4 x 1   Entered and Authorized by:   Paula Compton MD   Signed by:   Paula Compton MD on 05/24/2009   Method used:   Electronically to        [facility] [address] (retail)       [phone]       [place], [place]  [address]       Ph: [phone]       Fax: [phone]   RxID:   [id number]   Laboratory Results  Date/Time Received: May 24, 2009 11:23 AM  Date/Time Reported: May 24, 2009 11:59 AM   [employer] Source: vag WBC/hpf: >20 Bacteria/hpf: 3+ Clue cells/hpf: moderate  Positive whiff Yeast/hpf: none Trichomonas/hpf: many Comments: ...............test performed by......Marland KitchenBonnie A. [place], MLS (ASCP)cm

## 2010-02-16 NOTE — Assessment & Plan Note (Signed)
Summary: Asthma Followup    Vital Signs:  Patient profile:   30 year old female Height:      65.5 inches Weight:      239 pounds BMI:     39.31 Temp:     98.2 degrees F oral Pulse rate:   72 / minute BP sitting:   105 / 71  (right arm) Cuff size:   large  Vitals Entered By: Tessie Fass CMA (January 27, 2010 10:42 AM)  Serial Vital Signs/Assessments:                                PEF    PreRx  PostRx Time      O2 Sat  O2 Type     L/min  L/min  L/min   By 11:11 AM                      380                   Doree Albee MD 11:11 AM                      370                   Doree Albee MD 11:11 AM                      350                   Doree Albee MD  CC: F/U Asthma Is Patient Diabetic? No Pain Assessment Patient in pain? no        CC:  F/U Asthma.  Asthma History    Asthma Control Assessment:    Age range: 12+ years    Symptoms: 0-2 days/week    Nighttime Awakenings: 0-2/month    Interferes w/ normal activity: some limitations    SABA use (not for EIB): 0-2 days/week    FEV1: 1.67 liters (today)    FEV1 Pred: 3.45 liters (today)    PEF: 380 liters/minute (today)    Asthma Control Assessment: Well Controlled    Reason for alternate asthma control assessment: Pt now on stable regimen with significant improvement in resp sxs since starting advair 500/50 two times a day, singuair,  and zyrtec. Only using albuterol once per week.    Habits & Providers  Alcohol-Tobacco-Diet     Tobacco Status: never  Allergies: 1)  ! * Avelox 2)  * Shellfish 3)  * Cat Dog Dustmiite Cockroach  Social History: Smoking Status:  never  Physical Exam  General:  alert and overweight-appearing.   Mouth:  good dentition.  oral white plaques, non removable on post tongue.  Lungs:  diffuse expiratory wheeze most prominent in apices-->: improved from previous exam Heart:  RRR, no rubs, gallops, murmurs    Impression & Recommendations:  Problem # 1:  ASTHMA, PERSISTENT,  SEVERE (ICD-493.90) Assessment Improved Overall much improved control from previous clinical assessment. Will continue with advair 550-50 two times a day, singulair, and zyrtec. Now using albuterol 1qweek vs. twice per day from previous visit 12/28/09. Will refer to AI as pt reports remote hx/o allergy shots as a child. This may help overll respiratoy status if environmental allergies are playing a major role? Will f/u resp status in 1-2 months. Will contineu current regimen. Discussed resp red flags.  Pt agreeable to plan.  Her updated medication list for this problem includes:    Proair Hfa 108 (90 Base) Mcg/act Aers (Albuterol sulfate) .Marland Kitchen... 1-2 puff q 4 with spacer as needed for wheeze    Advair Diskus 500-50 Mcg/dose Aepb (Fluticasone-salmeterol) .Marland Kitchen... 1 puff two times a day    Singulair 10 Mg Tabs (Montelukast sodium) .Marland Kitchen... 1 tablet daily  Orders: Allergy Referral  (Allergy)  Problem # 2:  CANDIDIASIS, ORAL (ICD-112.0) Likley secondary to high dose ICS. Asymptomatic.  Will treat with oral nystatin. Plan to followup in 1-2 months at next visit.   Complete Medication List: 1)  Proair Hfa 108 (90 Base) Mcg/act Aers (Albuterol sulfate) .Marland Kitchen.. 1-2 puff q 4 with spacer as needed for wheeze 2)  Loestrin 24 Fe 1-20 Mg-mcg Tabs (Norethin ace-eth estrad-fe) .... Take one daily as directed 3)  Miralax Powd (Polyethylene glycol 3350) .Marland KitchenMarland KitchenMarland Kitchen 17 g dissolved in 8 ozs of water.  take 4 times a day until large bm; then start taking two times a day 4)  Plan B One-step 1.5 Mg Tabs (Levonorgestrel) .... Disp 1 pk and sig as directed 5)  Advair Diskus 500-50 Mcg/dose Aepb (Fluticasone-salmeterol) .Marland Kitchen.. 1 puff two times a day 6)  Singulair 10 Mg Tabs (Montelukast sodium) .Marland Kitchen.. 1 tablet daily 7)  Zyrtec Allergy 10 Mg Tabs (Cetirizine hcl) .Marland Kitchen.. 1 tablet daily 8)  Nystatin 100000 Unit/ml Susp (Nystatin) .... 4-6 mls by mouth 4 times a day for 2 days (retain in mouth as long as possible)  Other Orders: Pap Smear-[facility]  ([id number]) Flu Vaccine 61yrs + (09811) Admin 1st Vaccine (91478)  Patient Instructions: 1)  It was good to see you today 2)  Your asthma is doing much better  3)  I am referring you to an allergist for allergy testing 4)  I am also giving you a prescription for oral nystatin for your mouth-use as directed 5)  If you develop any worsening in your breathing or any other concerns, please give [place] a call 6)  Otherwise come back to see me in march for your pap smear 7)  God Bless, 8)  Doree Albee MD  Prescriptions: NYSTATIN 100000 UNIT/ML SUSP (NYSTATIN) 4-6 mLs by mouth 4 times a day for 2 days (retain in mouth as long as possible)  #1 bottle qs x 0   Entered and Authorized by:   Doree Albee MD   Signed by:   Doree Albee MD on 01/27/2010   Method used:   Electronically to        [facility] [address] (retail)       [phone]       [place], [place]  [address]       Ph: [phone]       Fax: [phone]   RxID:   [id number]    Orders Added: 1)  Pap Smear-[facility] [[id number]] 2)  Flu Vaccine 12yrs + [43329] 3)  Admin 1st Vaccine [51884] 4)  Allergy Referral  [Allergy]   Flu Vaccine Consent Questions:    Do you have a history of severe allergic reactions to this vaccine? no    Any prior history of allergic reactions to egg and/or gelatin? no    Do you have a sensitivity to the preservative Thimersol? no    Do you have a past history of Guillan-Barre Syndrome? no    Do you currently have an acute febrile illness? no    Have you ever  had a severe reaction to latex? no    Vaccine information given and explained to patient? yes    Are you currently pregnant? no   Influenza Vaccine (to be given today)   Prevention & Chronic Care Immunizations   Influenza vaccine: Fluvax 3+  (01/27/2010)    Tetanus booster: Not documented    Pneumococcal vaccine: Done.  (03/10/1998)   Pneumococcal vaccine due: None  Other Screening   Pap  smear: NEGATIVE FOR INTRAEPITHELIAL LESIONS OR MALIGNANCY.  (03/19/2008)   Pap smear action/deferral: Ordered  (01/27/2010)   Pap smear due: 03/19/2009   Smoking status: never  (01/27/2010)   Nursing Instructions: Give Flu vaccine today No Pap smear today    Appended Document: Asthma Followup     Clinical Lists Changes  Orders: Added new Test order of [facility]- Est Level  3 ([address]40) - Signed

## 2010-02-16 NOTE — Assessment & Plan Note (Signed)
Summary: Form and Asthma Followup    Vital Signs:  Patient profile:   30 year old female Height:      65.5 inches Weight:      233 pounds BMI:     38.32 O2 Sat:      97 % on Room air Temp:     98.2 degrees F oral Pulse rate:   69 / minute BP sitting:   108 / 71  (right arm)  Vitals Entered By: Terese Door (December 28, 2009 1:59 PM)  O2 Flow:  Room air CC: Form to be completed Is Patient Diabetic? No Pain Assessment Patient in pain? no        Primary Care Provider:  . [facility]  CC:  Form to be completed.  History of Present Illness: Pt reports improvement in resp sxs since last clinical visit. Did not get CXR because she forgot per pt. Still having nighttime dyspnea 3-5 times/wk-responsive to 1 puff of albuterol. Compliant w/ two times a day advair. Still using two times a day pulmicort. No fevers. increased sputum production.  Form: Pt was given form by Leafy Half about her student loans  to detail her disability given her severe asthma. Has to be filled out by MD.   Asthma History    Initial Asthma Severity Rating:    Age range: 12+ years    Nighttime Awakenings: >1/week but not nightly    Interferes w/ normal activity: no limitations    SABA use (not for EIB): >2 days/week but not >1X/day    Asthma Severity Assessment: Moderate Persistent   Habits & Providers  Alcohol-Tobacco-Diet     Tobacco Status: quit > 6 months  Allergies: 1)  ! * Avelox 2)  * Shellfish 3)  * Cat Dog Dustmiite Cockroach  Social History: Smoking Status:  quit > 6 months  Physical Exam  General:  alert and overweight-appearing.   Head:  normocephalic and atraumatic.   Eyes:  vision grossly intact.   Mouth:  good dentition.   Neck:  supple and full ROM.   Lungs:  diffuse end expiratory wheezes most prominent in apices bilaterally  Heart:  RRR, no rubs, gallops, murmurs    Impression & Recommendations:  Problem # 1:  ASTHMA, PERSISTENT, SEVERE (ICD-493.90) Assessment  Improved Pt still with nighttime dyspnea with advair 250/50 though overall respiratory status improved. Pt still using pulmicort two times a day. Will increase advair to 500/50 and d/c pulmicort. Plan to also start pt on zyrtec and singulair. . May consider re-referral to allergy/pulmonology given pt's hx/o alergic disease is if sxs still persistent on current regimen vs. addition of spiriva. Plan for peak flows at next visit. Respiratory red flags discussed with pt. Will reassess sxs in 4 weeks. Form: Filled out. 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  The following medications were removed from the medication list:    Pulmicort 0.25 Mg/83ml Susp (Budesonide) ..... One inh two times a day, qs Her updated medication list for this problem includes:    Proair Hfa 108 (90 Base) Mcg/act Aers (Albuterol sulfate) .Marland Kitchen... 1-2 puff q 4 with spacer as needed for wheeze    Advair Diskus 500-50 Mcg/dose Aepb (Fluticasone-salmeterol) .Marland Kitchen... 1 puff two times a day    Singulair 10 Mg Tabs (Montelukast sodium) .Marland Kitchen... 1 tablet daily  Orders: Pulse Oximetry- [facility] (94760)  Complete Medication List: 1)  Proair Hfa 108 (  90 Base) Mcg/act Aers (Albuterol sulfate) .Marland Kitchen.. 1-2 puff q 4 with spacer as needed for wheeze 2)  Loestrin 24 Fe 1-20 Mg-mcg Tabs (Norethin ace-eth estrad-fe) .... Take one daily as directed 3)  Miralax Powd (Polyethylene glycol 3350) .Marland KitchenMarland KitchenMarland Kitchen 17 g dissolved in 8 ozs of water.  take 4 times a day until large bm; then start taking two times a day 4)  Plan B One-step 1.5 Mg Tabs (Levonorgestrel) .... Disp 1 pk and sig as directed 5)  Advair Diskus 500-50 Mcg/dose Aepb (Fluticasone-salmeterol) .Marland Kitchen.. 1 puff two times a day 6)  Singulair 10 Mg Tabs (Montelukast sodium) .Marland Kitchen.. 1 tablet daily 7)  Zyrtec Allergy 10 Mg Tabs (Cetirizine hcl) .Marland Kitchen.. 1 tablet daily  Other Orders: [facility]- Est Level  3 (16109)  Patient  Instructions: 1)  It was good to see you today 2)  STOP taking the pulmicort  3)  I will consolidate the pulmicort into the advair at a higher dose  4)  I will also place you on singulair and zyrtec  5)  Come back in 1 month so that your breathing can be reevaluated  6)  If you develop any severe shortness of breath, or other concerning symptoms, please give [place] a call 7)  Otherwise, call with any questions 8)  God Bless and Merry Christmas 9)  Doree Albee MD  Prescriptions: Harless Nakayama ALLERGY 10 MG TABS (CETIRIZINE HCL) 1 tablet daily  #30 x 6   Entered and Authorized by:   Doree Albee MD   Signed by:   Doree Albee MD on 12/28/2009   Method used:   Electronically to        [facility] [address] (retail)       [phone]       [place], [place]  [address]       Ph: [phone]       Fax: [phone]   RxID:   [id number] SINGULAIR 10 MG TABS (MONTELUKAST SODIUM) 1 tablet daily  #30 x 6   Entered and Authorized by:   Doree Albee MD   Signed by:   Doree Albee MD on 12/28/2009   Method used:   Electronically to        [facility] [address] (retail)       [phone]       [place], [place]  [address]       Ph: [phone]       Fax: [phone]   RxID:   [id number] ADVAIR DISKUS 500-50 MCG/DOSE AEPB (FLUTICASONE-SALMETEROL) 1 puff two times a day  #1 diskus x 6   Entered and Authorized by:   Doree Albee MD   Signed by:   Doree Albee MD on 12/28/2009   Method used:   Electronically to        [facility] [address] (retail)       [phone]       [place], [place]  [address]       Ph: [phone]       Fax: [phone]   RxID:   [id number]    Orders Added: 1)  Pulse Oximetry- [facility] [94760] 2)  [facility]- Est Level  3 [85462]

## 2010-02-16 NOTE — Miscellaneous (Signed)
  Clinical Lists Changes  Problems: Removed problem of VAGINITIS (ICD-616.10) Removed problem of CHLAMYDIAL INFECTION (ICD-099.41) Removed problem of CHLAMYDTRACHOMATIS INFECTION LOWER GU SITES (ICD-099.53) Removed problem of VAGINAL DISCHARGE (ICD-623.5) Removed problem of CERVICALGIA (ICD-723.1) Removed problem of VAGINITIS, BACTERIAL (ICD-616.10) Removed problem of TRICHOMONAL VAGINITIS (ICD-131.01) Removed problem of CONTACT OR EXPOSURE TO OTHER VIRAL DISEASES (ICD-V01.79) Removed problem of CANDIDIASIS, ORAL (ICD-112.0) Removed problem of PREV C/S DELIV DELIV W/WO MENTION ANTPRTM COND (ZOX-096.04) Removed problem of PREGNANCY, NORMAL (ICD-V22.2) Removed problem of DIABETES MELLITUS, TYPE II, FAMILY HX (ICD-V18.0) Removed problem of ASTHMA, PERSISTENT (ICD-493.90) Removed problem of Question of  DYSMETABOLIC SYNDROME X (ICD-277.7) Removed problem of ORAL CONTRACEPTION (ICD-V25.41) Removed problem of SCREENING FOR MALIGNANT NEOPLASM, CERVIX (ICD-V76.2)

## 2010-03-24 ENCOUNTER — Ambulatory Visit (INDEPENDENT_AMBULATORY_CARE_PROVIDER_SITE_OTHER): Payer: Medicaid Other

## 2010-03-24 ENCOUNTER — Inpatient Hospital Stay (INDEPENDENT_AMBULATORY_CARE_PROVIDER_SITE_OTHER)
Admission: RE | Admit: 2010-03-24 | Discharge: 2010-03-24 | Disposition: A | Discharge status: Home / Self Care | Payer: Medicaid Other | Source: Ambulatory Visit | Attending: Family Medicine | Admitting: Family Medicine

## 2010-03-24 ENCOUNTER — Encounter (HOSPITAL_COMMUNITY): Payer: Self-pay

## 2010-03-24 DIAGNOSIS — J45909 Unspecified asthma, uncomplicated: Secondary | ICD-10-CM

## 2010-03-31 ENCOUNTER — Ambulatory Visit: Payer: Self-pay | Admitting: Family Medicine

## 2010-04-24 LAB — URINE CULTURE: Colony Count: 35000

## 2010-04-24 LAB — POCT URINALYSIS DIP (DEVICE)
Glucose, UA: NEGATIVE mg/dL
Ketones, ur: NEGATIVE mg/dL
Protein, ur: 30 mg/dL — AB
Specific Gravity, Urine: >=1.03 (ref 1.005–1.030)

## 2010-04-24 LAB — POCT PREGNANCY, URINE: Preg Test, Ur: NEGATIVE

## 2010-05-08 ENCOUNTER — Ambulatory Visit (INDEPENDENT_AMBULATORY_CARE_PROVIDER_SITE_OTHER): Payer: Medicaid Other | Admitting: Family Medicine

## 2010-05-08 ENCOUNTER — Encounter: Payer: Self-pay | Admitting: Family Medicine

## 2010-05-08 VITALS — BP 124/82 | HR 62 | Temp 99.4°F | Ht 65.5 in | Wt 240.0 lb

## 2010-05-08 DIAGNOSIS — J45909 Unspecified asthma, uncomplicated: Secondary | ICD-10-CM

## 2010-05-08 DIAGNOSIS — Z23 Encounter for immunization: Secondary | ICD-10-CM

## 2010-05-08 MED ORDER — MONTELUKAST SODIUM 10 MG PO TABS: 10.0000 mg | ORAL_TABLET | Freq: Every day | ORAL | Status: DC

## 2010-05-08 MED ORDER — CETIRIZINE HCL 10 MG PO TABS: 10.0000 mg | ORAL_TABLET | Freq: Every day | ORAL | Status: DC

## 2010-05-08 MED ORDER — ALBUTEROL SULFATE HFA 108 (90 BASE) MCG/ACT IN AERS: 2.0000 | INHALATION_SPRAY | Freq: Four times a day (QID) | RESPIRATORY_TRACT | Status: DC | PRN

## 2010-05-08 MED ORDER — BECLOMETHASONE DIPROPIONATE 80 MCG/ACT IN AERS: 2.0000 | INHALATION_SPRAY | RESPIRATORY_TRACT | Status: DC | PRN

## 2010-05-08 NOTE — Patient Instructions (Signed)
Asthma, Adult Asthma is caused by narrowing of the air passages in the lungs. It may be triggered by pollen, dust, animal dander, molds, some foods, respiratory infections, exposure to smoke, exercise, emotional stress or other allergens (things that cause allergic reactions or allergies). Repeat attacks are common. HOME CARE INSTRUCTIONS  Use prescription medications as ordered by your caregiver.   Avoid pollen, dust, animal dander, molds, smoke and other things that cause attacks at home and at work.   You may have fewer attacks if you decrease dust in your home. Electrostatic air cleaners may help.   It may help to replace your pillows or mattress with materials less likely to cause allergies.   Talk to your caregiver about an action plan for managing asthma attacks at home, including, the use of a peak flow meter which measures the severity of your asthma attack. An action plan can help minimize or stop the attack without having to seek medical care.   If you are not on a fluid restriction, drink 8 to 10 glasses of water each day.   Always have a plan prepared for seeking medical attention, including, calling your physician, accessing local emergency care, and calling 911 (in the [place] for a severe attack.   Discuss possible exercise routines with your caregiver.   If animal dander is the cause of asthma, you may need to get rid of pets.  SEEK MEDICAL CARE IF:  You have wheezing and shortness of breath even if taking medicine to prevent attacks.   An oral temperature above 100.5 develops.   You have muscle aches, chest pain or thickening of sputum.   Your sputum changes from clear or white to yellow, green, gray or bloody.   You have any problems that may be related to the medicine you are taking (such as a rash, itching, swelling or trouble breathing).  SEEK IMMEDIATE MEDICAL CARE IF:  Your usual medicines do not stop your wheezing or there is increased coughing and/or  shortness of breath.   You have increased difficulty breathing.   You have an oral temperature above 100.5, not controlled by medicine.  MAKE SURE YOU:  Understand these instructions.   Will watch your condition.   Will get help right away if you are not doing well or get worse.  Document Released: 01/01/2005 Document Re-Released: 01/23/2009 Exit[employer] Patient Information 2011 [employer]. 

## 2010-05-09 NOTE — Progress Notes (Signed)
  Subjective:    Patient ID: [name], female    DOB: 03-21-1980, 30 y.o.   MRN: [id number]  HPI Pt here for follow up asthma- Asthma- Overall stable per pt. Had follow up with allergy/asthma specialist. Placed on qvar, zyrtec, singulair, prn albuterol. Resp status overall much improved per pt. Currently using albuterol 1-2 times per week. Otherwise functioning/daily activity improved since improvement in pulmonary symptoms. No recent URIs.    Review of Systems See HPI     Objective:   Physical Exam Gen- up in bed, NAD, obese CV- RRR PULM- Overall good air movement. Faint end expiratory wheezes       Assessment & Plan:  Asthma- Overall well controlled on current regimen establish by asthma/allergist. Will defer management to asthma/allergy mangement. Pt unsure of name of allergist. Will follow up prn. Will otherwise follow up in 1 month for preventative care.

## 2010-05-11 NOTE — Assessment & Plan Note (Signed)
Overall well controlled on current regimen established by asthma/allergist. Will defer management to asthma/allergy mangement. Pt unsure of name/practice of allergist. Will follow up prn. Will otherwise follow up in 1 month for preventative care.

## 2010-05-30 NOTE — Discharge Summary (Signed)
[name], [name] NO.:  [id number]   MEDICAL RECORD NO.:  [id number]          PATIENT TYPE:  INP   LOCATION:  3310                         FACILITY:  [facility]   PHYSICIAN:  Leighton Roach McDiarmid, M.D.DATE OF BIRTH:  July 09, 1980   DATE OF ADMISSION:  07/26/2007  DATE OF DISCHARGE:  07/28/2007                               DISCHARGE SUMMARY   PRIMARY PHYSICIAN:  Eustaquio Boyden, MD, at [facility].   DISCHARGE DIAGNOSES:  1. Asthma exacerbation.  2. Seasonal allergies.  3. Gastroesophageal reflux disease.   DISCHARGE MEDICATIONS:  1. Symbicort 160/4.5 two puffs every 12 hours.  2. Accolate 20 mg 1 tablet twice a day.  3. Albuterol HFA 2 puffs every 4-6 hours as needed.  4. Cetirizine 10 mg 1 tablet p.o. daily.  5. Omeprazole 20 mg 1 tablet p.o. daily.  6. Prednisone 60 mg daily x5 days for a total 7-day course.   IMAGING:  Chest x-ray on July 26, 2007, shows no signs of pneumonia,  findings compatible with bronchitis and mild pulmonary hyperaeration.   LABORATORIES:  CBC on admission showed white blood count 17.1,  hemoglobin 12.9, hematocrit 38.5, platelets 324, and neutrophils 92.  Blood gas on admission:  pH 7.408, pCO2 32.5, PO2 68, bicarbonate 20.6.  Repeat ABG on July 27, 2007, at 3 a.m. showed blood gas pH 7.397, CO2 of  29, O2 of 102, bicarbonate 17.6.  CBC, July 27, 2007:  WBCs 11.7,  hemoglobin 12.2, hematocrit 37.0, and platelets 314. Sodium 138,  potassium 3.6, chloride 110, glucose 192, BUN 5, creatinine 0.89.  ABG  on July 27, 2007, at 7 a.m., pH 7.403, pCO2 of 31.8, pO2 of 94.9, and  bicarbonate 19.5.   BRIEF HOSPITAL COURSE:  Please see dictated H&P for full admission  details.  In brief, this is a 30 year old female with past medical  history significant for asthma presenting with asthma exacerbation.  1. Asthma exacerbation.  The patient was admitted to the step-down      unit with continuous albuterol nebulizers and O2 to keep  sats      greater than 92%.  By the next morning, the patient was tolerating      albuterol treatments q.4 h.  The patient was also treated with      prednisone 60 mg.  The patient's white count decreased by the time      of discharge.  No signs and symptoms of infection, WBC likely      demargination.  The patient stated inability to afford medications      as a contributing factor to her admission.  The patient was allowed      to take home leftover Symbicort and albuterol MDIs from her      hospital stay.  Social Work was contacted to speak to her regarding      obtaining assistance and Clare Gandy was also contacted.  At the time      of discharge, the patient's SpO2 was 96% on room air and was      tolerating Albuterol q4 hours..  2. Seasonal allergies.  The patient is stable on cetirizine 10 mg      daily.  3. Gastroesophageal reflux disease, asymptomatic, patient discharged      on omeprazole.   FOLLOW-UP APPOINTMENTS:  Dr. Eustaquio Boyden, August 03, 2007, at 9:30  a.m.   ISSUES FOR FOLLOWUP:  Medication compliance.      Delbert Harness, MD  Electronically Signed      Leighton Roach McDiarmid, M.D.  Electronically Signed    KB/MEDQ  D:  07/28/2007  T:  07/29/2007  Job:  [id number]   cc:   Eustaquio Boyden, MD

## 2010-05-30 NOTE — H&P (Signed)
NAMELACONDA, BASICH               ACCOUNT NO.:  [id number]   MEDICAL RECORD NO.:  [id number]          PATIENT TYPE:  INP   LOCATION:  5118                         FACILITY:  [facility]   PHYSICIAN:  Santiago Bumpers. Hensel, M.D.DATE OF BIRTH:  Jul 07, 1980   DATE OF ADMISSION:  08/27/2007  DATE OF DISCHARGE:  08/28/2007                              HISTORY & PHYSICAL   PRIMARY CARE Shandi Godfrey:  Eustaquio Boyden, MD, at [facility].   CHIEF COMPLAINT:  Asthma exacerbation.   HISTORY OF PRESENT ILLNESS:  The patient is a 30 year old female with  wheezing and shortness of breath.  The patient states that she has been  out of and unable to afford her asthma medication, so she has not been  taking them for the past week.  Yesterday, she noticed she was getting  more short of breath, worse with activity.  Was having wheezing as well.  She went to the [facility].  There, she was given  a continuous nebulizer, steroids, and was feeling better.  However, when  she was walking around the unit, her shortness of breath suddenly  returned.  She denies any fevers, she has had a dry cough over the past  day.  Has had some pleuritic chest pain, worse with cough.  Denies any  known triggers but was outside over the past couple of days and visiting  her mother in [place].  She is also in the process of moving.   MEDICATIONS:  1. Symbicort 160-4.5 two puffs every 12 hours.  2. Prilosec OTC 20 mg 1 tab 30-60 minutes before meal.  3. ProAir HFA 108 mcg-act 1-2 puffs every 4 hours with spacer as      needed for wheezing.  4. Accolate 20 mg take 1 tab by mouth 2 times a day.   Of note, the patient has not been taking her Symbicort and Accolate  because she has been unable to afford them.   ALLERGIES:  1. AVELOX.  2. SHELLFISH.  3. CAT, DOG, DUST MITE, and COCKROACH.   PAST MEDICAL HISTORY:  1. Asthma, last exacerbation 1 month ago.  Has a greater than 10  exacerbations.  2. Allergic rhinitis.  3. GERD.  4. G1 P1.  5. History of abnormal Pap with a repeat within normal limits.   FAMILY HISTORY:  Asthma in her father and grandmother.  Eczema in her  son.  Multiple relatives with hypertension and diabetes mellitus.  Family history is negative for coronary artery disease, but positive for  gynecologic cancer.   SOCIAL HISTORY:  Unemployed.  Lives with 13-year-old son.  No tobacco,  alcohol, or drug use.  Dad rarely involved.  No smoking in house,  central air and electric heat, and carpet.  Only smoked 2 years in 2002  and 2004.  Occasional passive smoke exposure.   REVIEW OF SYSTEMS:  See HPI for details, otherwise negative.   PHYSICAL EXAMINATION:  VITAL SIGNS:  Afebrile.  Vital signs stable.  The  patient is saturating greater than 90% on 2 L nasal cannula.  GENERAL:  A well  developed, well nourished, in no acute distress, alert,  appropriate, and cooperative throughout examination.  Able to speak in  full sentences.  HEAD:  Normocephalic and atraumatic without obvious abnormalities.  No  apparent alopecia or balding.  NOSE:  No nasal discharge.  MOUTH:  Mucous membranes moist.  NECK:  No deformities, masses, or tenderness noted.  No lymphadenopathy.  LUNGS:  Inspiratory and expiratory wheezes, decreased air movement  throughout.  No rales or rhonchi.  Able to speak in full sentences.  Last one was approximately 3 to 4 hours ago.  HEART:  Normal rate and regular rhythm.  S1 and S2 normal without  gallop, murmur, click, rub, or other extra sounds.  ABDOMEN:  Obese, soft, nontender, and normal bowel sounds.  EXTREMITIES:  No clubbing, cyanosis, edema, or deformity noted.   ASSESSMENT AND PLAN:  The patient is a 30 year old female with known  asthma and multiple asthma exacerbations who comes in with a repeat  asthma exacerbation.   PROBLEM:  1. Asthma with acute exacerbation.  This is likely due to her      nonadherence with her  medications.  She is now saturating well on      room air, though she has been on 2 L nasal cannula.  We will      discontinue her oxygen and see how her saturations remain.      Continue albuterol nebulizers q.4 h. scheduled q.2 h. as needed.      Start prednisone 40 mg by mouth daily x5-7 days.  Asthma teaching.      We will order social work consult to evaluate options for the      patient's meds and if she cannot afford them and cannot tolerate      alternatives to Symbicort.  She may need to be established via      [facility] or [facility] for medications.  Chest x-ray without      infiltrate and the patient afebrile.  CT angio negative for PE.  2. Allergic rhinitis, unchanged.  Will continue home Zyrtec.  3. Gastroesophageal reflux disease.  This is unchanged.  Will continue      her home medication of Prilosec.      Angelena Sole, MD  Electronically Signed      Santiago Bumpers. Leveda Anna, M.D.  Electronically Signed    WS/MEDQ  D:  08/27/2007  T:  08/29/2007  Job:  [id number]

## 2010-05-30 NOTE — Discharge Summary (Signed)
NAMEJACQUE, [name]               ACCOUNT NO.:  [id number]   MEDICAL RECORD NO.:  [id number]          PATIENT TYPE:  INP   LOCATION:  5118                         FACILITY:  [facility]   PHYSICIAN:  Santiago Bumpers. Hensel, M.D.DATE OF BIRTH:  1980/11/30   DATE OF ADMISSION:  08/27/2007  DATE OF DISCHARGE:  08/28/2007                               DISCHARGE SUMMARY   PRIMARY CARE Devone Bonilla:  Dr. Sharen Hones at [facility].   DISCHARGE DIAGNOSES:  1. Asthma.  2. Allergic rhinitis.  3. Gastroesophageal reflux disease.   DISCHARGE MEDICATIONS:  1. Albuterol 2.5 mg nebulized every 4 hours as needed for wheezing.  2. Pulmicort 1 mcg/2 mL nebulized 2 times a day.  3. Nebulizer machine with tubing.   PROCEDURES:  1. CT angiography of the chest.  Impression, negative for pulmonary      embolus or acute cardiopulmonary disease.  2. Chest x-ray.  Impression, no acute infiltrate or pleural effusion.   LABORATORY DATA:  1. Cardiac enzymes, negative x2.  2. D-dimer 0.50.   BRIEF HOSPITAL COURSE:  The patient is a 30 year old female with asthma,  allergic rhinitis, and GERD who has been hospitalized many times in the  past for asthma exacerbations who was admitted again for treatment of an  asthma exacerbation.   Asthma exacerbation.  This episode is brought on just like prior  hospitalizations because the patient was not taking her medications.  The patient states that she is unable afford her medication, and that is  why she was not taking them.  The patient lost her Medicare Part D and  her insurance [employer]ived[employer] Mirabella because she was not paying her premium.  On the  day of admission, the patient was complaining of increased wheezing and  shortness of breath.  The patient went to [facility] Emergency  Department where she was given continuous nebulizers, steroids, and was  feeling better, but when the patient attempted to ambulate and walk  around the room, her shortness of breath  quickly returned.   The patient had a slightly elevated D-dimer.  So, a CT of the chest was  obtained, and this was negative for PE or any acute pulmonary process.  The patient was admitted for observation.  She was placed on q.4 h.  albuterol nebulizers, prednisone, and 2 L of O2 nasal cannula.  The  patient's shortness of breath clearly improved while in the hospital.  On the day of discharge, the patient's breathing had returned to  baseline.  The patient was seen and evaluated by the [facility] Service Team, and was deemed stable, and ready for discharge.  The patient will be sent home on albuterol and Pulmicort nebulizers  because Medicare will cover those.  Medicare is supposed to contact the  patient and come to the patient's home and set up the nebulizer machine  and bring the prescribed medications.   DISCHARGE INSTRUCTIONS:  The patient instructed to resume regular  activity.  The patient is to return to hospital for any increased  wheezing or shortness of breath.   FOLLOWUP APPOINTMENTS:  The patient is to return to Dr. Sharen Hones at  [facility], phone number [phone] on September 29, 2007, at 9 o'clock a.m.   DISCHARGE CONDITION:  The patient was discharged home in stable medical  condition.      Angelena Sole, MD  Electronically Signed      Santiago Bumpers. Leveda Anna, M.D.  Electronically Signed    WS/MEDQ  D:  08/29/2007  T:  08/30/2007  Job:  [id number]

## 2010-05-30 NOTE — H&P (Signed)
[name], [name] NO.:  [id number]   MEDICAL RECORD NO.:  [id number]          PATIENT TYPE:  INP   LOCATION:  3310                         FACILITY:  [facility]   PHYSICIAN:  Nestor Ramp, MD        DATE OF BIRTH:  [birth year]   DATE OF ADMISSION:  07/26/2007  DATE OF DISCHARGE:                              HISTORY & PHYSICAL   PRIMARY CARE PHYSICIAN:  Eustaquio Boyden, MD at the [facility].   CHIEF COMPLAINT:  Asthma exacerbation.   HISTORY OF PRESENT ILLNESS:  This is a 30 year old female with past  medical history significant for asthma presenting with asthma  exacerbation.  The patient has had increasing cough especially at night  for the past week.  The day prior to admission, she presented to Urgent  Care with cough and wheeze and was given a prescription for  corticosteroids and albuterol nebs, but was unable to fill prescription  secondary to finances.  The patient has also run out of her other asthma  medications including Accolate and Symbicort.  She has been relying  solely on her albuterol HFA, which she has been using approximately  every 2 hours for the past week.  She woke this morning with increasing  wheezing and shortness of breath and was out of her albuterol HFA as  well.  Late this afternoon, she again presented to Urgent Care, where  she was given epinephrine IM, corticosteroids IM, and albuterol nebs x2.  The patient did not respond to these treatments, so she was sent to the  [facility] ED.   PAST MEDICAL HISTORY:  1. Asthma.  Last exacerbation approximately 6 months ago.  She has had      in excess of 10 hospitalizations for asthma exacerbations.  Her      triggers are hot weather, cigarette smoke, and pets.  2. Gastroesophageal reflux disease.  3. Seasonal allergies.  4. History of urinary tract infection approximately 4 years ago.   FAMILY HISTORY:  Asthma in her father, aunt, and grandmother.  Eczema in  her  son.  Multiple relatives with hypertension and diabetes mellitus.  Family history is negative for coronary artery disease, but positive for  gynecologic cancer.   SOCIAL HISTORY:  The patient is unemployed and lives with her 49-year-old  son in [place].  She denies tobacco, alcohol, or illicit drug use.  She does have occasional passive smoke exposure.   REVIEW OF SYSTEMS:  As per HPI, otherwise negative.   ALLERGIES:  SHELLFISH, MOXIFLOXACIN, DOGS, and PETS.   MEDICATIONS:  1. Symbicort 160/4.5 two puffs q.12 h.  2. Zafirlukast (Accolate) 20 mg p.o. b.i.d.  3. Albuterol HFA as needed.  4. Cetirizine 10 mg p.o. daily.  5. Omeprazole 20 mg p.o. daily.   PHYSICAL EXAMINATION:  VITAL SIGNS:  Temperature 98.0, heart rate 106-  119, respirations 20-28, blood pressure 113-153/77-95, and saturations  92-100% on O2 face mask.  GENERAL:  Alert and oriented, in respiratory distress, and cooperative.  CARDIAC:  Tachycardiac.  Regular rate and rhythm without murmurs, rubs,  or gallops.  Normal precordium.  PULMONARY:  Bilateral wheezes.  Increased work of breathing.  No  tripoding.  No retractions.  ABDOMEN:  Obese, soft, and nontender.  Normoactive bowel sounds with no  masses or hepatosplenomegaly.  EXTREMITIES:  No edema or tenderness in bilateral lower extremities.   LABS/RADIOLOGY:  ABG showed a pH of 7.408, PCO2 of 32.5, PO2 of 68.0,  and bicarb of 20.6.  White blood count is 17.1.  Creatinine is 0.9.  Chest x-ray shows bronchitis with mild hyperaeration.   ASSESSMENT AND PLAN:  This is a 30 year old female with:  1. Asthma exacerbation.  ABG shows mild respiratory alkalosis, and the      patient is requiring continuous albuterol nebulizers in the ED      after epinephrine; albuterol nebs and steroids at Urgent Care.  We      will admit the patient to step-down unit with oxygen to maintain      saturations greater than 92% as well as continuous albuterol      nebulizers.  Recheck  her ABG in the morning.  We will also start a      prednisone burst at this time.  No signs or symptoms of infection      at this time, the elevated white blood count is likely margination      from corticosteroids.  2. Left flank pain.  The patient has a distant history of urinary      tract infection, but is afebrile at this point.  We will check      urinalysis.  This is related to her lower back strain, and we will      treat with acetaminophen for now.  We will initiate antibiotics,if      urinalysis show signs of infection.  3. Seasonal allergies.  Continue the patient's home cetirizine 10 mg      p.o. daily.  4. Gastroesophageal reflux disease.  The patient is asymptomatic.  We      will treat with pantoprazole during this hospitalization.  5. Fluids, electrolytes, nutrition/gastrointestinal.  The patient      appears well hydrated, so we will Hep-Lock IV fluids and provide a      regular adult diet.  Prophylaxis, Lovenox.   DISPOSITION:  Pending resolution of asthma exacerbation.   DISCHARGE REQUIREMENTS:  Saturations greater than 92% on room air,  nebulizer treatments no more frequently than every 4 hours, and  tolerating p.o. intake.      Romero Belling, MD  Electronically Signed      Nestor Ramp, MD  Electronically Signed    MO/MEDQ  D:  07/27/2007  T:  07/27/2007  Job:  [id number]

## 2010-06-02 NOTE — H&P (Signed)
NAMEKHAMIL, [name]                         ACCOUNT NO.:  [id number]   MEDICAL RECORD NO.:  [id number]                   PATIENT TYPE:  INP   LOCATION:  1845                                 FACILITY:  [facility]   PHYSICIAN:  Lorne Skeens, D.O.                   DATE OF BIRTH:  06-24-1980   DATE OF ADMISSION:  06/15/2002  DATE OF DISCHARGE:                                HISTORY & PHYSICAL   CHIEF COMPLAINT:  Shortness of breath.   HISTORY OF PRESENT ILLNESS:  This is a 30 year old, obese, African-American  female with past medical history of asthma who presented for her third time  in one week for an asthma exacerbation.  She apparently stopped taking her  Advair about three weeks ago when she ran out and could not afford any more.  She also ran out of her Zyrtec one week ago and has been using increased  albuterol MDIs.  She had increased shortness of breath over the last two to  three weeks, especially with exertion.  Could not catch my breath today.  Increased dry cough.  She has not been to the [facility] since 12/03.  Transportation has been a problem.  She is approximately three months  pregnant, currently going to [facility].  She also believes that  pregnancy was a contraindication to her current asthma medications because  it would hurt the baby.  She was in the emergency department approximately  four days ago and sent home with prescription for a Prednisone taper,  however she did not fill this due to financial problems.   REVIEW OF SYSTEMS:  CONSTITUTIONAL: She is feeling hot and cold, increased  fatigue and generalized weakness. CARDIOVASCULAR: Midsternal chest pain that  is pleuritic.  GI: Positive for nausea, no vomiting or diarrhea, positive  for constipation, no abdominal pain.  NEURO: She is tremulous.  ENT:  Productive cough with clear sputum.  RESPIRATORY: Increased shortness of  breath with exertion, decreased exercise tolerance, positive wheezing.  SKIN: Without  rash.   PAST MEDICAL HISTORY:  1. Allergic rhinitis.  2. Asthma.  3. Paresthesias.  4. She was hospitalized several months ago for asthma exacerbation and no     history of intubation.   GYN HISTORY:  She is G1, P0, approximately three months pregnant with an  last menstrual period of 04/12/02, currently going to [facility] for  prenatal care.   PAST SURGICAL HISTORY:  Negative.   MEDICATIONS:  1. Advair Diskus 500 mg two puffs b.i.d., ran out three weeks ago.  2. Proventil MDI using p.r.n.  3. Singulair 10 mg p.o. daily, continues to take.  4. Zyrtec 10 mg p.o. daily, ran out one week ago.   ALLERGIES:  No known drug allergies.   SOCIAL HISTORY:  She lives with her Mom and two sisters. Her dad is rarely  involved.  There is no smoking in the  house.  She denies smoking or alcohol  use, however her boyfriend does smoke.  She has applied for Medicaid.  Denies any sick contacts.   FAMILY HISTORY:  A maternal grandmother and an aunt have asthma.   PHYSICAL EXAMINATION:  VITAL SIGNS: Pulse of 124, temperature currently  pending, respirations 28, blood pressure 127/81, SAO2 is 98% on eight  liters.  GENERAL: She is alert and oriented x3, appearing in no acute distress at the  time of exam.  HEENT: Head is normocephalic, atraumatic.  Pupils are equal, round and  reactive to light and accommodation.  Extraocular muscles are intact  bilaterally.  Nose is without rhinorrhea or epistaxis.  Oropharynx is pink  and moist, mildly injected without exudate.  There are moist mucous  membranes.  NECK: Without lymphadenopathy, neck is supple without thyromegaly.  LUNGS: Bilateral diffuse loud expiratory wheezing without crackles or  rhonchi, no accessory muscle use.  Respiratory rate is currently 22 and is  non labored.  CARDIOVASCULAR: Regular rate and rhythm without murmurs.  EXTREMITIES: No clubbing, cyanosis or edema.  ABDOMEN: Soft, obese, nontender, nondistended, normal active  bowel sounds,  no rebound, guarding or rigidity.  Hepatosplenomegaly not able to be  palpated secondary to body habitus.  SKIN: Warm and dry without rashes.  NEURO EXAM: She is not tremulous.  Cranial nerves II-XII are grossly intact  bilaterally.   LABORATORY DATA:  Blood gas of 7.30, PCO2 46.1 and a bicarb of 23.  Her  sodium was 137, potassium 3.9, chloride 104, BUN 4, bicarb 23, BUN 4,  creatinine 0.7, glucose 112.  Her CBC shows a white count of 15,900 with 63%  neutrophils, and an elevated ANC of 10 and 22% lymphocytes, her hemoglobin  is 13.1 with an MCV of 82.2, hematocrit is 39.5, platelet count 336,000.  Her chest x-ray shows mild hyperinflation, otherwise no acute distress.   ASSESSMENT AND PLAN:  30 year old African-American female with asthma  exacerbation secondary to abruptly stopping her Advair three weeks ago.  1. Asthma exacerbation status post continuous albuterol nebulizations in the     ED, change to one an hour p.r.n., Atrovent nebulization x2, Brethine     nebulization with saline times one and subcutaneous Brethine times one,     Solu-Medrol 125 mg IV times one, Mag-sulfate 2 grams IV times one, all     performed in the emergency department and during EMS.  Exacerbation     secondary to discontinuation of chronic asthma medications including     Advair due to 1) cost; 2) misunderstanding, thinking that pregnancy is a     contraindication to her asthma medicines; and 3) transportation problems.     Multiple emergency department visits without follow up with primary     doctor is also a big problem.  Admit to the hospital for asthma     exacerbation with supplemental O2, cover with __________ 500 mg p.o. x3     days.  Given her symptoms of cough with an elevated ANC, albuterol     nebulizations as above, IV Solu-Medrol, change to Prednisone on 06/16/02.     She needs asthma education and teaching of peak flows.  I advised her to    use peak flows to monitor the  status of her asthma.  Monitor serial lung     exams and look for improvement in her wheezing.  She will need outpatient     follow up and financial assistance in getting her asthma medications.  2.  Pregnancy currently followed at [facility] for prenatal care.  Would     like her to be changed to [facility]     prenatal care.  3. Social.  Social work consult re financial need.  Medicaid application in     process.  Medical  problems are centered around her social problems.                                               Lorne Skeens, D.O.    KL/MEDQ  D:  06/15/2002  T:  06/15/2002  Job:  [id number]   cc:   Jeoffrey Massed, M.D.  [facility]  [place], [place] [address]  Fax: [phone]

## 2010-06-02 NOTE — H&P (Signed)
[name], [name]               ACCOUNT NO.:  [id number]   MEDICAL RECORD NO.:  [id number]          PATIENT TYPE:  INP   LOCATION:  0344                         FACILITY:  [facility]   PHYSICIAN:  Deirdre Peer. Polite, M.D. DATE OF BIRTH:  April 08, 1980   DATE OF ADMISSION:  03/11/2004  DATE OF DISCHARGE:                                HISTORY & PHYSICAL   CHIEF COMPLAINT:  Coughing spells.   HISTORY OF PRESENT ILLNESS:  A 30 year old female with frequent  exacerbations of asthma, who presents to [facility] with complaints of productive  cough, shortness of breath, and expiratory wheeze.  The patient denies any  fever, chills, nausea, vomiting, however, does admit to above chief  complaint.  The patient thinks that the trigger for her asthma exacerbation  this time is her son getting a cold and thinks that she is picking up what  he has had.  The patient stated that she is compliant with all her  medicines.  There is no smoke.  Typical triggers for her asthma are a change  in weather.  Again, the patient stated that she is compliant with her  medications and cannot identify any other trigger other than her son getting  sick.  She stated that she is up to date with her flu shot and her pneumonia  vaccine.  In the ED, the patient was evaluated.  She continued wheezing  despite multiple nebulizer treatments.  The patient is saturating 91% in the  ED.  Chest x-ray without infiltrate, however, ABG shows significant hypoxia  with pO2 of 60 on room air.  Admission is deemed necessary for further  evaluation and treatment.   PAST MEDICAL HISTORY:  1.  As stated above.  2.  Significant for moderate to severe asthma.  3.  Questionable allergic rhinitis.  4.  Questionable GERD.  5.  Obesity.   MEDICATIONS ON ADMISSION:  Advair, Singulair, Zyrtec, albuterol, and  Prilosec.   SOCIAL HISTORY:  Negative for tobacco, alcohol, or drugs.   PAST SURGICAL HISTORY:  The patient denies.   ALLERGIES:   None.   FAMILY HISTORY:  Noncontributory.   REVIEW OF SYSTEMS:  As stated in the HPI, otherwise negative.   PHYSICAL EXAMINATION:  GENERAL:  The patient is alert and oriented x 3.  No  apparent distress.  VITAL SIGNS:  Temp 98.2, BP 132/92, pulse 98, respiratory rate was 20,  saturating 91%.  HEENT:  Within normal limits.  CHEST:  Diffuse expiratory wheeze bilaterally, noted accessory muscle use.  CARDIOVASCULAR:  Regular.  No S3.  ABDOMEN:  Soft, nontender, no hepatosplenomegaly.  EXTREMITIES:  No cyanosis, clubbing, or edema.  NEUROLOGIC:  Nonfocal.   DATA:  Chest x-ray without infiltrate.  ABG on room air:  PH 7.4, pCO2 35,  pO2 of 60.  BMET:  Sodium 141, potassium 3.4, chloride 110, carbon dioxide  27, BUN 9, creatinine 0.8, calcium 9.3, total protein 6.9.  AST and ALT 15  and 14, respectively.   ASSESSMENT:  1.  Asthma exacerbation.  2.  History of gastroesophageal reflux disease.  3.  Obesity.   Recommend  the patient be admitted to a medicine floor bed.  The patient will  be treated in a customary fashion with oxygen, nebulizers, antibiotics, and  steroids.  We will check peak flows pre and post nebulizer treatments.  We  will obtain sputum cultures.  Make further recommendations after review of  the above.      RDP/MEDQ  D:  03/12/2004  T:  03/12/2004  Job:  [id number]

## 2010-06-02 NOTE — H&P (Signed)
NAMEMARIBELL, Cynthia               ACCOUNT NO.:  [id number]   MEDICAL RECORD NO.:  [id number]          PATIENT TYPE:  INP   LOCATION:  3707                         FACILITY:  [facility]   PHYSICIAN:  Wayne A. Sheffield Slider, M.D.    DATE OF BIRTH:  13-Dec-1980   DATE OF ADMISSION:  12/25/2003  DATE OF DISCHARGE:  12/26/2003                                HISTORY & PHYSICAL   HISTORY OF PRESENT ILLNESS:  The patient is a 30 year old female with a past  medical history of asthma that presented to [facility]  two to three days ago with shortness of breath and weakness, given albuterol  nebulizers x2 and then albuterol Atrovent nebulizer treatment.  The patient  stabilized and was sent home.  The patient returned to [facility] emergency department today with repeat of shortness of breath,  weakness worsening over the last two days.  The patient has developed chest  pressure and dyspnea on exertion.  She has also had associated rhinorrhea x3  days and cough productive of clear phlegm.  She denies fevers, chills,  abdominal pain, acute diarrhea, or constipation.  The patient was last  admitted for asthma exacerbation in August of 2005.  She reports that she is  compliant with her medications.  She denies sick contacts, no pets, or new  aggravating environmental irritants.  The patient has received two albuterol  5 mg nebulizer treatments, methylprednisolone 125 mg, Xopenex nebulizer x3,  and has remained tachypneic in the mid-20's with an O2 saturation in the low  90's.   PAST MEDICAL HISTORY:  Asthma, allergic rhinitis, paresthesias.   MEDICATIONS:  1.  Zyrtec 10 mg p.o. daily.  2.  Singulair 10 mg p.o. daily.  3.  Advair 500 two puffs b.i.d.   ALLERGIES:  No known drug allergies.   SOCIAL HISTORY:  The patient lives with mother, two sisters, and son.  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  She denies tobacco, alcohol, and illicit drug  use.  She does not report  any smoking in the home, no pets in the home.  The  patient has central air and electric heat.  Denies sick contacts.  She is  sexually active.   FAMILY HISTORY:  Mother obesity, sister obesity, grandmother coronary artery  disease, son ADHD.   REVIEW OF SYSTEMS:  As stated in the HPI, positive for shortness of breath,  chest pressure, increased shortness of breath with exertion or conversation,  occasional constipation/diarrhea, positive urinary frequency and urgency.  She denies burning or itching at urethra or vaginal area.   PHYSICAL EXAMINATION:  VITAL SIGNS:  Temperature 99.1, heart rate 98,  respiratory rate 24, blood pressure 120/76, O2 saturation 93% on 2.5 liters  of oxygen.  GENERAL:  Alert and oriented, pleasant, shortness of breath with  conversation.  HEENT:  Normocephalic and atraumatic.  PERRL.  EOMI.  Oropharynx clear.  Nasal passages patent, wet, nonerythematous.  No lymphadenopathy.  No  thyromegaly.  Trachea midline.  HEART:  Mild tachycardia, no murmurs, rubs, or gallops.  LUNGS:  Diffuse bilateral wheezing,  poor aeration, use of accessory muscles  to breathe.  ABDOMEN:  Soft, nontender, and nondistended.  No hepatosplenomegaly.  EXTREMITIES:  No extremity edema.  Brachial and pedal pulses 2+ bilaterally.  No cyanosis.  2+ capillary refill.  NEUROLOGY:  Cranial nerves II-XII grossly intact. 2+ reflexes.   LABORATORY DATA:  Chest x-ray taken at [facility] shows  minimal peribronchial thickening without infiltrate or pneumothorax.   ASSESSMENT:  A 30 year old female with acute asthma exacerbation.   Problem 1.  Exacerbation, unknown trigger.  Frequent admissions this year,  possibly secondary to URI.  This admission will continue nebulizer treatment  with Atrovent q.6h, albuterol q.4h and two hours p.r.n.  Will add prednisone  60 mg p.o. daily.  May want to repeat chest x-ray in one to two days to  reevaluate for pneumonia.  Will continue to  monitor O2 saturations  overnight.   Problem 2.  UTI.  Will order urinalysis and culture for signs and symptoms  of UTI and treat appropriately.       VRE/MEDQ  D:  12/26/2003  T:  12/27/2003  Job:  [id number]

## 2010-06-02 NOTE — Discharge Summary (Signed)
[name], [name]                         ACCOUNT NO.:  [id number]   MEDICAL RECORD NO.:  [id number]                   PATIENT TYPE:  INP   LOCATION:  5039                                 FACILITY:  [facility]   PHYSICIAN:  Asencion Partridge, M.D.                  DATE OF BIRTH:  1981-01-12   DATE OF ADMISSION:  09/19/2002  DATE OF DISCHARGE:  09/22/2002                                 DISCHARGE SUMMARY   PRIMARY CARE PHYSICIAN:  Douglass Rivers, M.D.   DISCHARGE DIAGNOSES:  1. Acute asthma exacerbation.  2. Intrauterine pregnancy at 24 weeks.  3. Infiltrate versus atelectasis on chest x-ray.   DISCHARGE MEDICATIONS:  1. Zithromax 250 mg 1 p.o. daily x3 days to finish a 5-day course.  2. Prednisone 60 mg daily x3 days to finish a 5-day course.  3. Albuterol metered dose inhaler 2 puffs q.6-8h. x 2 days, then 2 puffs     p.r.n.  4. Singulair 10 mg 1 p.o. daily.  5. Advair 500/50, 1 puff b.i.d.  6. Zyrtec 10 mg 1 p.o. daily.   ACTIVITY:  Patient is encouraged to increase activity and increase time out  of sitting position or out of bed.   FOLLOW UP:  With regular scheduled appointment on October 06, 2002, with  the [facility].   BRIEF HISTORY:  [name] is a 30 year old black female, at 24 weeks  intrauterine pregnancy, who presented to the ER with sudden onset chest  tightness and shortness of breath similar to previous asthma exacerbations  and had not been taking her asthma medications in the past few days.  Her  chest x-ray showed mild right lower lobe atelectasis versus infiltrate, and  she is admitted for asthma exacerbation.   HOSPITAL COURSE:  1. Asthma exacerbation:  The patient was placed on p.o. steroids and     albuterol nebulizer treatments q.4h., then spaced to q.6h.  She was     placed on her regular home asthma medications as well as empiric     Zithromax for community-acquired pneumonia.  Upon discharge, patient's     signs and symptoms had  improved as well as her peak flow had increased     from average of 280 to average of 400.   1. Intrauterine pregnancy:  Patient experienced no complications during this     hospital admission.  No vaginal-bleeding.  No abdominal pain or     contractions.  Fetal heart tones remained detectable and reassuring, and     Mom reported positive fetal movement.   1. Right lower lobe atelectasis versus infiltrate:  Patient placed on     empiric treatment of Zithromax and encouraged to increase ambulation for     possible atelectasis.    PROCEDURE:  Spiral CT ruled out pulmonary embolus.   CONSULTS:  None.        Ace Gins, MD  Asencion Partridge, M.D.    JS/MEDQ  D:  09/22/2002  T:  09/23/2002  Job:  [id number]   cc:   Douglass Rivers, M.D.  [facility].  [facility] Resident  [place], [place] [address]  Fax: [phone]

## 2010-06-02 NOTE — H&P (Signed)
[name], [name]                         ACCOUNT NO.:  [id number]   MEDICAL RECORD NO.:  [id number]                   PATIENT TYPE:  INP   LOCATION:  5010                                 FACILITY:  [facility]   PHYSICIAN:  Wayne A. Sheffield [name], M.D.                 DATE OF BIRTH:  [birth year]   DATE OF ADMISSION:  08/23/2003  DATE OF DISCHARGE:                                HISTORY & PHYSICAL   PRIMARY CARE PHYSICIAN:  Dr. Douglass Rivers at [facility].   CHIEF COMPLAINT:  Asthma exacerbation.   HISTORY OF PRESENT ILLNESS:  The patient is a 30 year old female with a past  medical history of multiple admissions and emergency department visits for  asthma exacerbation who presented to the [facility] ED with worsening of  her asthma exacerbation.  She was seen last week and treated with Prednisone  and Tussionex in the emergency department and today got considerably worse.  She does complain of a cough productive of yellow-green sputum.  She used  her albuterol MDI inhaler today approximately x5 without much improvement.  Her shortness of breath worsens with movement and is better with sitting  still.   REVIEW OF SYSTEMS:  She denies fevers, chills, or sick contacts.  Complains  of pleuritic chest pain, productive cough, shortness of breath, no wheezing.  Denies nausea, vomiting, constipation, diarrhea, abdominal pain.  Denies  rash.  Denies focal weakness, numbness or tingling.  She does complain of right upper extremity pain, stabbing in nature.  She  complains of rhinorrhea but no sore throat, no dysuria, no urinary  frequency.  Her last menstrual period was August 16, 2003.   PAST MEDICAL HISTORY:  1. Asthma.  2. Rhinitis.  3. Paresthesias.   GYN HISTORY:  She is G1, P1, 0, 0, 1, status post C-section.   PAST SURGICAL HISTORY:  Low transverse cesarean section in December, 2004.   Of note:  Her best peak flow is 420.   HOME MEDICATIONS:  1. Advair Diskus 500 mg 2  puffs b.i.d.  2. Proventil MDI p.r.n.  3. Singulair 10 mg p.o. daily.  4. Zyrtec 10 mg p.o. daily.   ALLERGIES:  She has no known drug allergies.   FAMILY HISTORY:  Significant for allergic rhinitis and asthma in her sister  and father.  Coronary artery disease in grandmother.  Obesity in mother and  sister.   SOCIAL HISTORY:  She lives with her mother and 2 sisters.  She had a son who  was born in September of 2004.  Her father is rarely involved.  There is no  smoking in the home; she has central air and electric heat, there is  carpeting in the home.  Denies smoking or alcohol and is sexually active.   PHYSICAL EXAMINATION:  VITAL SIGNS:  Temperature 97.8, blood pressure  115/60, heart rate 96, saturating 95% on 2 liters nasal cannula.  GENERAL:  She is in no acute distress and does appear fairly comfortable,  speaking in full sentences.  HEENT:  Pupils are equal, round and reactive to light.  Extraocular motions  intact. Oropharynx is without erythema or exudate.  She has nasal cannula  oxygen in place.  NECK:  She has no thyromegaly, no lymphadenopathy.  RESPIRATORY:  She has scattered wheezing throughout both lung fields and  anteriorly, and fair air movement.  CARDIOVASCULAR:  She is mildly tachycardiac with a regular rhythm, 2+ distal  pulses, she has no extremity edema.  EXTREMITIES:  No clubbing, cyanosis or edema.  ABDOMEN:  Soft, nontender, nondistended with normal active bowel sounds.  NEUROLOGIC:  She has no focal signs of weakness.  Cranial nerves II-XII  grossly intact.  She has 1+ and symmetrically equal deep tendon reflexes.   No labs were ordered in the emergency department.  She has a CBC with  differential, BMET and a chest x-ray pending.   ASSESSMENT/PLAN:  For this 30 year old female with asthma exacerbation.  She  received continuous nebulizations and Solu-Medrol in the emergency  department without much improvement so she was transferred here.  The   patient cannot identify any triggers except possibly the heat, but I will  check a white blood count for evidence of infection although the patient is  afebrile.  It is important to note that she does have a productive cough and  it seems possible or even likely that a viral infectio9n may have triggered  this particular flare up.  Will give albuterol, Atrovent nebulizer  treatments q.4h, q.2h p.r.n. and switch her to p.o. Prednisone.  Will check  a chest x-ray to rule out an infectious trigger such as pneumonia and will  continue her Singular.  She will have a regular diet.      Cynthia Beath, MD                     [name], M.D.    JT/MEDQ  D:  08/23/2003  T:  08/23/2003  Job:  [id number]   cc:   Douglass Rivers, M.D.  [facility].  [address]. Resident  [place], [place] [address]  Fax: [phone]

## 2010-06-02 NOTE — H&P (Signed)
[place]. [facility]  Patient:    [name], [name]                      MRN: [id number] Adm. Date:  [id number] Attending:  Armanda Heritage Dictator:   Guadalupe Dawn, M.D. CC:         Talmage Nap, M.D., [facility]   History and Physical  PROBLEM LIST:  Acute exacerbation of asthma.  HISTORY OF PRESENT ILLNESS:  This 30 year old African-American female presents to the [facility] Emergency Department on the day of admission with three-day history of shortness of breath and increased work of breathing, especially with exertion.  She reports dry cough began approximately five days ago.  She then states that three days ago, she began having shortness of breath and increased work of breathing while in [place] visiting some family members.  She was seen at a hospital in [place] and refused admission, to return home for treatment here at [facility]. She continues to have nonproductive cough and persistent chest tightness.  She has done albuterol inhaler treatments throughout the day without significant relief.  She denies fever, chills, nausea, vomiting or diarrhea.  She states that the change of season can trigger an attack and it was warm and humid in [place] prior to her departure and when she arrived in [place], it was cold and dry over the weekend.  She has not taken a peak flow reading today. She feels that this is a severe asthma exacerbation for her.  She states her last exacerbation was one month ago and required hospital admission to the [facility].  PAST MEDICAL HISTORY 1. Asthma. 2. Allergic rhinitis.  MEDICATIONS 1. Advair Diskus 50/500 one puff b.i.d. 2. Albuterol MDI p.r.n. 3. Allegra 60 mg b.i.d. p.r.n. 4. Nasonex as needed. 5. Singulair 10 mg p.o. q.d.  ALLERGIES:  No known drug allergies.  SOCIAL HISTORY:  She lives with her mother and her two sisters.  She  denies smoking in the house; she does, however, state that while she was visiting her grandmother in [place], her grandmother smoked in another room or outside the house.  She denies tobacco abuse herself; also denies alcohol and drug abuse. As noted in the HPI, her triggers include change of season and cold weather.  FAMILY HISTORY:  Her sister and her father have allergic rhinitis and asthma. History of coronary artery disease in a grandmother.  Mother and sister both suffer from obesity.  REVIEW OF SYSTEMS:  Negative except for HPI.  PHYSICAL EXAMINATION  VITAL SIGNS:  Temperature 98.9, blood pressure 107/56, respiratory rate 24, pulse 124, oxygen saturation 93% on room air.  GENERAL:  She is a well-appearing, obese African-American female in mild respiratory distress, speaking in complete sentences, alert and oriented x 3.  HEENT:  [place]/AT.  PERRL.  EOMI.  Oropharynx normal.  Tympanic membranes normal bilaterally.  Sclerae anicteric.  Conjunctivae not injected.  NECK:  Supple.  No lymphadenopathy.  No JVD.  No thyromegaly.  No accessory muscle use.  CHEST:  Significantly decreased air movement bilaterally with marked diffuse expiratory wheezes.  There is mild increase in work of breathing without retractions.  CARDIOVASCULAR:  Tachycardic with regular rhythm.  No murmur.  ABDOMEN:  Soft, nontender, nondistended.  Normoactive bowel sounds.  No hepatosplenomegaly.  No mass.  EXTREMITIES:  No edema.  No cyanosis.  NEUROLOGIC:  Grossly intact.  RECTAL:  Deferred.  LABORATORY AND X-RAY FINDINGS:  Sodium 142, potassium 3.9, chloride 103, bicarb 30, BUN 12, glucose 91.  Hemoglobin 15.  Chest x-ray shows a questionable right lower lobe pneumonia.  ASSESSMENT AND PLAN:  Acute asthma exacerbation.  This appears to be a moderate-to-severe exacerbation for this otherwise healthy patient.  She has had multiple admissions previously without history of intubation.  We  will continue corticosteroid and beta agonist therapy aggressively overnight, in addition to her home medications.  She has no other clinical signs of pneumonia so we will hold antibiotic therapy for now and reassess in the morning. DD:  12/11/99 TD:  12/12/99 Job: [id number]

## 2010-06-02 NOTE — Discharge Summary (Signed)
[name], [name]               ACCOUNT NO.:  [id number]   MEDICAL RECORD NO.:  [id number]          PATIENT TYPE:  INP   LOCATION:  5715                         FACILITY:  [facility]   PHYSICIAN:  Henri Medal, MDDATE OF BIRTH:  04-23-1980   DATE OF ADMISSION:  05/05/2004  DATE OF DISCHARGE:  05/08/2004                                 DISCHARGE SUMMARY   DISCHARGE DIAGNOSES:  1.  Asthma exacerbation.  2.  Renal stones.   DISCHARGE MEDICATIONS:  1.  Advair Diskus  Dictation ended here.      FIM/MEDQ  D:  05/08/2004  T:  05/08/2004  Job:  [id number]

## 2010-06-02 NOTE — H&P (Signed)
[name], [name]                         ACCOUNT NO.:  [id number]   MEDICAL RECORD NO.:  [id number]                   PATIENT TYPE:  INP   LOCATION:  5027                                 FACILITY:  [facility]   PHYSICIAN:  Douglass Rivers, M.D.                DATE OF BIRTH:  July 02, 1980   DATE OF ADMISSION:  01/06/2002  DATE OF DISCHARGE:                                HISTORY & PHYSICAL   CHIEF COMPLAINT:  Shortness of breath and right arm pain.   HISTORY OF PRESENT ILLNESS:  [name] is a 30 year old female with a  history of asthma since age 52 who presented to the [facility] Emergency  Department with a history of increased shortness of breath the previous  night.  She initially was lying in bed and said that she felt like she was  hot and then began feeling short of breath.  Shortness of breath has  increasingly worsened since then to the point where she is short of breath  when she walks to the bathroom.  She had an emergency room visit earlier  today in which she was seen and evaluated and according to the ED, she was  recommended to stay, but declined, stating that she needed to work.  Since  then, he shortness of breath has gotten worse.  The patient has been using  Albuterol as frequent as every five minutes without any relief.  She has  failed to receive any relief from the nebulizer treatment she has received  this evening in the emergency room.  She said she has not taken any asthma  medicines in the last two days because she ran out.  She has a cough  productive of clear sputum.  She has some exposure to cigarette smoke but it  is outside.  She says that her last peak flow was in the resident of 160 two  days ago.   REVIEW OF SYSTEMS:  General:  Denies fevers.  Respiratory:  See HPI.  Cardiovascular:  Noncontributory.  GI:  No constipation or diarrhea.  Musculoskeletal:  Complains of back and right arm pain that is worse with  movement and touch, says it has occurred  in the past in association with her  asthma exacerbations, but this generally is symmetric in her back.  She  denies any associations, denies any trauma.  HEENT:  Denies any congestion.  GU:  No dysuria or urinary frequency.  Other review of systems is  noncontributory.   PAST MEDICAL HISTORY:  1. Asthma with history of approximately two ER visits a year and has three     hospitalizations in the last few years.  No intubation.  2. Allergic rhinitis.  3. Paresthesias with numbness in her left antecubital fossa secondary to     complication from Norplant removal.   MEDICATIONS:  1. Advair Diskus 500 two puffs b.i.d.  2. Proventil p.r.n.  3.  Singulair 2 mg q.d.  4. Zyrtec 2 mg q.d.  5. Ortho-Novum patch one q. week for three weeks.   ALLERGIES:  No known drug allergies.   SOCIAL HISTORY:  The patient denies smoking or alcohol.  Says there is no  smoking in the house, but smoking does occur outside the house.  She works  at a [employer].   FAMILY HISTORY:  There is allergic rhinitis and asthma in a sister and her  mother.   OBJECTIVE:  VITAL SIGNS:  Afebrile at 99.2, pulse 110, respiratory rate 46,  blood pressure 126/56 and saturating 99% on room air.  GENERAL: She is awake, alert and in some mild distress secondary to arm pain  and shortness of breath.  HEENT:  Normal.  CARDIOVASCULAR:  She is tachycardic with regular rate and rhythm, no  murmurs, rubs or gallops.  She has normal capillary refill.  RESPIRATORY:  She has positive inspiratory and expiratory wheeze with  expiration greater than inspiratory phase for air movement without crackles.  She has some accessory muscle use.  GI:  She is obese, but otherwise benign abdomen.  EXTREMITIES:  There is normal strength, sensation and deep tendon reflexes  in the bilateral upper extremities.  SKIN:  Warm with good color.   LABORATORY DATA:  White blood cell count 15.0 with an ANC of 11.1,  hemoglobin 12.7, electrolytes within  normal limits.  Chest x-ray showed  slightly increased perihilar markings, questions of asthma versus bronchitis  without any significant infiltrate.   This is a 30 year old female with a history of asthma, now with an asthma  exacerbation, not responsive to Albuterol treatment.  No evidence for  pneumonia so will not treat with antibiotics.  No history suggestive of flu.  Arm pain appears musculoskeletal in origin and likely secondary to muscle  use with increased work of breathing.  Plan to admit, continue pulse  oximetry monitoring, will treat with steroids (IV and p.o.).  Continue  Albuterol nebulizers and Atrovent if appears to help, will wean oxygen if  tolerated.  Will measure peak flows. Plan to reiterate asthma medication and  importance of making sure adequate supply of medicines and proper use of  Albuterol.  Suspect that the increased white blood cell count may be  secondary to a viral bronchitis which may have contributed to asthma  exacerbation.  Will hold antibiotics until fever does not improve or if she  has purulent sputum.                                               Douglass Rivers, M.D.    CH/MEDQ  D:  01/07/2002  T:  01/07/2002  Job:  [id number]   cc:   [name], M.D.  [address]  [place]  [place] [address]  Fax: [phone]

## 2010-06-02 NOTE — H&P (Signed)
NAMEEUNICE, WINECOFF NO.:  [id number]   MEDICAL RECORD NO.:  [id number]          PATIENT TYPE:  INP   LOCATION:  5004                         FACILITY:  [facility]   PHYSICIAN:  Melina Fiddler, MD DATE OF BIRTH:  12/04/1980   DATE OF ADMISSION:  01/28/2004  DATE OF DISCHARGE:  01/30/2004                                HISTORY & PHYSICAL   CHIEF COMPLAINT:  Asthma exacerbation.   HISTORY OF PRESENT ILLNESS:  Ms. Steffler is a 30 year old patient with  severe, persistent asthma who presented to the clinic today for follow-up of  asthma.  She was seen the prior night in the emergency room.  At that point  she was given nebulized treatment and a prescription dose of prednisone and  a prescription which she has not filled yet.  In the clinic she was treated  with Solu-Medrol 125 mg IM x1 and serial Atrovent/albuterol nebulized  treatments.  Following these she continued to have significant symptoms of  shortness of breath and wheezing.  She has been taking her medicines as  prescribed.  It was not exactly clear whether or not she actually had an  albuterol MDI with her.  She did admit that her toddler broke her spacer.  Denies any fevers or cough.  She has had URI symptoms the preceding day and  currently.   PAST MEDICAL HISTORY:  1.  Asthma, moderate to severe, persistent.  Best peak flow 420.  2.  Allergic rhinitis.  3.  History of abnormal Pap.  4.  Status post cesarean section for NRFHT at term.   MEDICATIONS:  1.  Albuterol p.r.n.  2.  Zyrtec 10 mg daily.  3.  Singulair 10 mg daily.  4.  Advair Diskus 500/50 two puffs b.i.d.  5.  Currently on prednisone, but did not know the dose.   ALLERGIES:  No known drug allergies.   FAMILY HISTORY:  Significant for allergic rhinitis and asthma in her father  and sister.   SOCIAL HISTORY:  She is not smoking.  Does not drink alcohol.   REVIEW OF SYSTEMS:  As per HPI, otherwise normal or noncontributory.   PHYSICAL EXAMINATION:  VITAL SIGNS:  Temperature 98.3, pulse 130, oxygen  saturation 92-93% following multiple albuterol treatments.  GENERAL:  She was well-appearing, though moderately short of breath at rest.  Was able to speak in short sentences.  CARDIOVASCULAR:  Tachycardic.  Heart sounds obscured by wheezing.  HEENT:  Conjunctivae were clear.  Extraocular movements are intact.  Oropharynx without exudate or erythema.  NECK:  Supple.  RESPIRATORY:  There were diffuse inspiratory and expiratory wheezes  throughout.  No crackles or rhonchi by my examination.  Dr. Cleophas Dunker notes  she felt there was a slightly decreased amount of breath sounds in the right  middle lobe.  There is poor air movement.  EXTREMITIES:  No clubbing, cyanosis, edema.   ASSESSMENT/PLAN:  1.  Asthma exacerbation with failed outpatient treatment.  Trigger likely      upper respiratory infection.  Will admit for steroids, nebulizer      treatment, and oxygen as  needed.  Will check a chest x-ray due to      abnormality on examination.  Will not treat with antibiotics unless      there is concern for pneumonia.  Patient will need further maximization      of her outpatient management for asthma as for the last few months she      has had a severe exacerbation requiring ER visits or hospitalizations      approximately once a month.  One thing she is not on which may be      considered is a PPI as a possible reflux-causing trigger symptom.       CH/MEDQ  D:  01/31/2004  T:  01/31/2004  Job:  [id number]

## 2010-06-02 NOTE — Discharge Summary (Signed)
[place]. [facility]  Patient:    [name], [name]                      MRN: [id number] Adm. Date:  [id number] Disc. Date: [id number] Attending:  Cecil Cranker Dictator:   Maryelizabeth Rowan, M.D.                           Discharge Summary  DATE OF BIRTH:  [birth year]  PRIMARY DIAGNOSIS:  Asthma exacerbation.  SECONDARY DIAGNOSIS:  Allergic rhinitis.  HISTORY OF PRESENT ILLNESS:  This 30 year old African-American female with known history of asthma presents with shortness of breath.  She states she had run out of her inhaler medications two days ago and also states that she is unsure of the date of expiration of the albuterol nebulizers she has been trying at home.  She presented to the ER and was admitted.  HOSPITAL COURSE:  The patient was admitted to the pediatric unit after she was given albuterol nebulizers continuously x 1 hour in the ER.  She received the nebulizers q.2h. for the first six hours of her hospitalization and was then spread to q.4h.  In the late afternoon on the date of discharge the patient was doing well, not requiring albuterol treatment q.4h. and on physical exam had mild inspiratory wheezes at the bases only.  DISPOSITION:  We discharged the patient home.  CONDITION ON DISCHARGE:  Stable.  DISCHARGE MEDICATIONS: 1. Albuterol nebulizer every six hours to be continued this evening and    tomorrow at home.  Given prescription for these albuterol nebulizers. 2. Changed her regimen to include Advair viscus 500/50 mg 1 puff b.i.d. 3. Nasonex b.i.d. 4. Allegra 60 mg q.d. 5. Singulair 10 mg q.d. 6. Prednisone taper 60 mg x 2 days, 50 x 2 days, 40 x 1, 30 x 1, 20 x 1, and    10 x 1 day.  FOLLOW-UP:  Appointment made with Dr. Jolinda Croak on November 14, 1999, at 3 p.m. and also a regularly scheduled appointment on November 20, 1999.  The patient will also receive the flu shot before she is discharged from the hospital.  LABORATORY  DATA:  No significant laboratories obtained.  CONSULTATIONS:  No significant consults obtained. DD:  11/08/99 TD:  11/09/99 Job: [id number]

## 2010-06-02 NOTE — Discharge Summary (Signed)
. [facility]  Patient:    [name], [name]                      MRN: [id number] Adm. Date:  [id number] Disc. Date: 12/12/99 Attending:  Doneta Public Dictator:   Melba Coon, M.D. CC:         Talmage Nap, M.D.   Discharge Summary  PRIMARY PHYSICIAN:  Talmage Nap, M.D.  DISCHARGE DIAGNOSIS:  Asthma exacerbation.  DISCHARGE MEDICATIONS: 1. Advair Diskus 500 mg one puff b.i.d. 2. Albuterol inhaler two puffs q.4h. p.r.n. 3. Allegra 180 mg one tablet p.o. q.d. 4. Nasonex two sprays to each nostril q.d. 5. Singulair 10 mg one tablet p.o. q.d. 6. Prednisone 60 mg one tablet p.o. q.d. x 5 days. 7. Motrin 800 mg one tablet p.o. q.6-8h. p.r.n. arm pain.  SUMMARY:  Please refer to the admission H&P for a more detailed history and physical.  Briefly, the patient is an 30 year old African-American female who reported having a dry cough beginning approximately five days prior to admission.  Three days ago, she started having shortness of breath with increased work of breathing while she was in [place] visiting her family. She was seen at a hospital in [place] and refused admission at that time and wanted to return home for treatment at [facility]. [facility].  The patient denied fever, chills, nausea, vomiting, and diarrhea.  She states that the change in season can often trigger an attack and every time she travels to [place] she feels that the seems to happen.  She felt that this was a severe exacerbation for her and she had not done peak flows in several days.  LABORATORY DATA:  An ABG on admission showed a pH of 7.42, a pCO2 of 45.7, a and bicarbonate of 30.0.  CBC with white blood cell count of 10.1, hemoglobin 12.2, hematocrit 37.5, and platelets 380.  Sodium 142, potassium 3.9, chloride 103, BUN 12, glucose 91.  The chest x-ray revealed the heart size to be normal.  Markings were accentuated in the  lung fields, particularly in the right lower lung field. There was a questionable area of patchy infiltrate.  HOSPITAL COURSE: #1 - ASTHMA EXACERBATION:  On admission, the patient was saturating 93% on room air.  The patient was started on low levels of O2, as well as Solu-Medrol 125 mg IV q.8h.  She was continued on her albuterol and Atrovent nebulizers, Singulair, and Advair Diskus.  She was also continued on her Nasonex and Allegra.  The morning after admission, the patient was found to have a pretreatment peak flow of 100 and a post treatment peak flow of 160.  These greatly improved throughout the course of the day.  She was able to be discontinued from her O2 and was saturating 96-97% on room air.  On discharge, her peak flows were 290.  She stated that she felt remarkably better and felt that her breathing was essentially effortless now compared to on admission. She was switched to p.o. prednisone on the morning of December 12, 1999.  She has been able to tolerate that without problems.  She will be discharged home with prednisone 60 mg x 5 days.  She will also be continued on her other asthma and allergy medications.  Of note, the Allegra was increased to 180 mg one tablet p.o. q.d.  DISCHARGE CONDITION:  Good.  DISPOSITION:  Discharged home with family.  ACTIVITY:  The patient was told to limit her physical activity until her peak flows return to normal.  DIET:  No restrictions.  SPECIAL INSTRUCTIONS:  She is to continue following her peak flows twice a day.  We established an action plan with her normal peak flow being around 400.  This will be considered her green zone and she is to continue her current medicines.  The yellow zone is less than 320 and she is to be started on oral prednisone at that time.  The red zone is less than 200 and she is go to the emergency department.  FOLLOW-UP:  The patient will be followed up by Talmage Nap, M.D., on Thursday,  December 14, 1999, as previously scheduled.  The patient voiced agreement and understanding of the above discharge plans and had no further questions. DD:  12/12/99 TD:  12/12/99 Job: [id number]

## 2010-06-02 NOTE — H&P (Signed)
[place]. [facility]  Patient:    [name], [name]                        MRN: [id number] Dictator:   Lyndee Leo. Janey Greaser, M.D.                         History and Physical  HISTORY OF PRESENT ILLNESS:  This is a 30 year old black female who comes in today on February 21, 1999 with a chief complaint of wheezing and shortness of breath 2 days.  Patient gives a history of cough and runny nose with progressive shortness of breath over the last two days.  Denies any fever.  No nausea, vomiting or diarrhea.  Patient states that she has asthma attacks about once to twice a month and was hospitalized last year for asthma exacerbation.  States triggers are change in season and cold temperatures.  REVIEW OF SYSTEMS:  Negative for fever.  No muscle aches.  PROBLEM LIST:  Allergic rhinitis and asthma.  MEDICATIONS: 1. Albuterol MDI p.r.n. 2. Allegra 60 mg b.i.d. p.r.n. 3. Flovent 220 mcg two puffs b.i.d. 4. Nasonex one puff to each nostril b.i.d.  ALLERGIES:  No known drug allergies.  SOCIAL HISTORY:  Lives in [place] with her mother and two sisters. Patient does not smoke.  No smokers in the house.  Has carpet.  PHYSICAL EXAMINATION:  VITAL SIGNS:  O2 saturations 96% on room air.  Vitals reviewed.  HEENT:  TMs are clear bilaterally.  Extraocular movements are intact.  Pupils are equal, round and reactive to light.  Oropharynx is clear with no erythema or exudate.  NECK:  Supple with no lymphadenopathy.  CARDIOVASCULAR:  Regular rate and rhythm.  No murmurs, rubs, or gallops.  LUNGS:  She has bilateral wheezes, expiratory greater than inspiratory.  She has increased work of breathing and is using accessory muscles.  Has coarse breath sounds bilaterally.  Air movement is heard in all lung bases.  Patient does not  have any nasal flaring.  Capillary refill is less than 2 seconds.  ABDOMEN:  Soft, nontender, nondistended, with positive bowel  sounds.  EXTREMITIES:  No clubbing, cyanosis, or edema.  LABORATORY AND X-RAY FINDINGS:  ABG on room air revealed a pH of 7.43, CO2 of 38, PO2 of 87, bicarb of 25.  ASSESSMENT:  [age]-year-old black female with asthma exacerbation triggered by viral upper respiratory tract infection.  No history of intubation.  Patient will be placed on albuterol nebulizers q.2h. schedule with q.1h. p.r.n., started on steroids as well as Atrovent nebulizers.  Will admit for 23-hour observation at  step-down unit.  Will monitor ABG and give oxygen to keep saturations greater than 92%.  Chest x-ray is pending as well as a CBC also pending. DD:  02/21/99 TD:  02/21/99 Job: [id number]

## 2010-06-02 NOTE — H&P (Signed)
[name], [name]               ACCOUNT NO.:  [id number]   MEDICAL RECORD NO.:  [id number]          PATIENT TYPE:  INP   LOCATION:  1827                         FACILITY:  [facility]   PHYSICIAN:  Penni Bombard, MD       DATE OF BIRTH:  February 21, 1980   DATE OF ADMISSION:  07/13/2004  DATE OF DISCHARGE:                                HISTORY & PHYSICAL   CHIEF COMPLAINT:  Asthma exacerbation/shortness of breath.   HISTORY OF PRESENT ILLNESS:  This is a 30 year old African-American female  who has had worsening of her asthma over this past week with acute worsening  today. She endorses having an upper respiratory tract infections and URI  symptoms recently. She is noncompliant with her medications. She went to her  friend's house today and her friend has a dog. She states that this has  caused her asthma to become much worse. The patient has never been intubated  in the past for asthma excerebrations. Further history was difficult to  obtain secondary to the patient's excessive increased work of breathing.   PAST MEDICAL HISTORY:  1. Significant for moderate persistent asthma.  2. Allergic rhinitis.  3. History of abnormal Pap.   MEDICATIONS:  1. Advair Diskus 500 mg 2 puffs b.i.d.  2. Singulair 10 mg p.o. daily.  3. Zyrtec 10 mg p.o. daily.  4. Prilosec 20 mg p.o. daily.  5. Albuterol MDI p.r.n.   ALLERGIES:  No known drug allergies.   SOCIAL HISTORY:  She lives with her Mom, has 2 sisters. Denies smoking.  Denies alcohol. She has one son.   FAMILY HISTORY:  Family history of allergic rhinitis and asthma in her  sister and her Dad. Coronary artery disease in her grandmother, and obesity  in her Mom and her sister.   PHYSICAL EXAMINATION:  VITAL SIGNS:  Heart rate 120's, O2's were in the  80's, blood pressure 160/81, temperature had not yet been obtained.  GENERAL:  Increased work of breathing and fatigue.  MENTAL STATUS:  She was alert and oriented x3.  HEENT:  Head was  atraumatic and normal in appearance. Eyes were PERRLA and  EOMI. Mouth and throat non erythematous with no exudates and mucous  membranes were slightly dry in appearance.  CHEST EXAM:  Reveals diffuse wheezes with scattered rhonchi and a very short  inspiratory phase and long expiratory phase.  CARDIOVASCULAR: Tachycardia, no murmurs, rubs, or gallops.  ABDOMEN:  Positive bowel sounds, nontender, nondistended and soft.  LOWER EXTREMITIES:  There is no clubbing, cyanosis or edema. There are 2+  pulses in all extremities.  RECTAL:  Exam was deferred.  NEUROLOGIC:  Within normal limits. Cranial nerves II-XII grossly intact. The  patient is alert and oriented x3. Deep tendon reflexes are 2+ in all  extremities. Balance and gait not evaluated secondary to patient's increased  work of breathing.  SKIN:  There are no rashes, adenopathy. There is no submandibular or  supraclavicular lymphadenopathy.   LABORATORY VALUES:  ABG: 7.1284/69.9/111/26.3/97%. Chest x-ray reveals  hyperexpansion but otherwise clear.   ASSESSMENT/AND PLAN:  1. Asthma exacerbation. I will  start the patient on BiPap and continue IV      steroids. The patient has already received magnesium 2 mg IV. I will      continue albuterol nebulizations q.2h with Atrovent nebulizations q.6h.      May need albuterol nebulizations q.1h p.r.n. I will continue Protonix      IV. I will recheck an ABG 1 hours post starting BiPAP. Given the      patient's decreased pH, it suggests that she is getting very fatigued      and she may need intubation. However, the patient is doing much better      now on BiPAP. I will continue to follow her closely. I will notify her      family members of her current situation. Her chest x-ray is clear so I      will not start antibiotics as I doubt a bacterial process is playing a      role here. This is likely secondary to environmental exposures and      viral etiology.   1. Deep venous thrombosis  prophylaxis. Will give STDs to lower      extremities.   1. Diet: n.p.o. for now until work of breathing decreases.   1. Tachycardia secondary to 1 hour continuous nebulizations and increased      work of breathing. Will follow.       SJ/MEDQ  D:  07/13/2004  T:  07/13/2004  Job:  [id number]

## 2010-06-02 NOTE — Discharge Summary (Signed)
[name], [name]               ACCOUNT NO.:  [id number]   MEDICAL RECORD NO.:  [id number]          PATIENT TYPE:  INP   LOCATION:  3033                         FACILITY:  [facility]   PHYSICIAN:  Wayne A. Sheffield Slider, M.D.    DATE OF BIRTH:  July 29, 1980   DATE OF ADMISSION:  04/28/2005  DATE OF DISCHARGE:  04/30/2005                                 DISCHARGE SUMMARY   DISCHARGE DIAGNOSES:  1.  Asthma exacerbation.  2.  Acute bronchitis.  3.  Right sternal pain.  4.  Obesity.  5.  Gastroesophageal reflux disease.  6.  Seasonal allergic rhinitis.   DISCHARGE MEDICATIONS:  1.  Advair 50/500 one puff twice daily.  2.  Albuterol nebulizers 2.5 mg to 5 mg every 6 hours x3 days, then as      needed.  3.  Spiriva inhaler, inhale one capsule daily.  4.  Doxycycline 100 mg twice a day x6 days.  5.  Guaifenesin 600 mg twice daily to loosen mucus.  6.  Prednisone 40 mg take once daily x8 days.  7.  Percocet 5/325 mg take one every 6 hours for pain.  8.  Prilosec 20 mg p.o. daily.  9.  Singulair 10 mg p.o. daily.  10. Zyrtec 10 mg p.o. daily.   FOLLOWUP:  The patient is to follow up with Dr. Dellis Anes at [facility], [phone], on Thursday, May 03, 2005, at 10:30 a.m.   BRIEF HISTORY OF PRESENT ILLNESS:  The patient is a 30 year old female with  known asthma, who states she was feeling quite well until April 26, 2005,  when she went to visit a friend who smoked.  She spent all day at her  friend's house and by the evening of the same day, started feeling chest  tightness.  She went home the next morning with increasing chest tightness  and mild shortness of breath.  When she got to her home, she smells natural  gas and it appears she did have a gas leak in her home.  She has had this  taken care of, but later decided to shower, and then came into the shower  and she became very short of breath and wheezing, therefore called EMS.  On  further questioning, she had a cough  productive of greenish-brown phlegm for  the past 2 days, but denied any fever or chest pain.   Please see the dictated H&P on April 27, 2005.   HOSPITAL COURSE:  The patient was admitted and given albuterol nebulizers,  steroids, as well as antihistamines.  The patient was also treated with  Avelox x1, but developed itching, and was then switched to doxycycline for  treatment of acute bronchitis.  The patient did have an elevated white blood  cell count and increase in sputum production.  The patient was changed to 5-  mg nebulizers q.4 h. to q.2 h. p.r.n. and did well.  The patient was also  started on Spiriva during this hospitalization.  The patient was having peak  flows of 300, which are best at home at 450.  On date of discharge, the  patient's SATS were 97% on room air.  The patient states she was breathing  better.  She did have positive wheezing, however, good air movement on date  of discharge.  The patient was afebrile.  The patient was treated with  doxycycline 100 mg b.i.d. and will complete a course for a total of 7 days.  The patient will be sent home with albuterol nebulizers 5 mg every 6 hours  for the next 3 days, then as needed.  The patient will be continued on her  Advair 50/500 as well as Spiriva.  The patient will also be sent home with  guaifenesin and prednisone 40 mg to take once a day to complete a 10-day  course.  The patient did complain of some mild right sternal pain.  It was  reproducible and especially when she coughs; she says this happens every  time she has an asthma exacerbation, so we will send her home with some  Percocet 5/325 mg, 10 tablets to continue.  If her pain continues in her  sternum, she should go back to [facility] and have this  further evaluated with a chest x-ray.  The patient had received maximum  benefit of this hospitalization and is now ready for discharge home.  The  patient was given an asthma albuterol treatment  prior to discharge.  The  patient will be sent home with her nebulizer treatments as well as be given  prescriptions for all of her medicines at discharge.   LABORATORY DATA:  Chest x-ray on admission showed small right pleural  effusion versus pleural thickening.   BMET on day of discharge shows sodium 139, potassium 3.4, chloride 103,  bicarb of 27, glucose of 97, BUN of 13, creatinine of 0.8 and calcium 8.7.  The patient was supplemented with potassium prior to being discharged.  White blood cell count was 13.9, hemoglobin 12.8, hematocrit 38.2, platelet  count of 362,000 with 92% neutrophils.      Barth Kirks, M.D.    ______________________________  Arnette Norris. Sheffield Slider, M.D.    MB/MEDQ  D:  04/30/2005  T:  05/01/2005  Job:  [id number]

## 2010-06-02 NOTE — Discharge Summary (Signed)
[name], [name]               ACCOUNT NO.:  [id number]   MEDICAL RECORD NO.:  [id number]          PATIENT TYPE:  INP   LOCATION:  1428                         FACILITY:  [facility]   PHYSICIAN:  Hillery Aldo, M.D.   DATE OF BIRTH:  25-Dec-1980   DATE OF ADMISSION:  11/08/2004  DATE OF DISCHARGE:  11/11/2004                                 DISCHARGE SUMMARY   PRIMARY CARE PHYSICIAN:  The patient is unassigned.   DISCHARGE DIAGNOSES:  1.  Acute asthma exacerbation.  2.  Gastroesophageal reflux disease.  3.  Seasonal allergic rhinitis.  4.  Leukocytosis.  5.  Hypokalemia.  6.  Constipation.   DISCHARGE MEDICATIONS:  1.  Advair 500/50 1 puff b.i.d.  2.  Singulair 10 mg daily.  3.  Zyrtec 10 mg daily.  4.  Prilosec 20 mg daily.  5.  Prednisone taper 60 mg to off over 6 days.   CONSULTATIONS:  None.   PROCEDURES AND DIAGNOSTIC STUDIES:  Chest x-ray on November 08, 2004 showed  mild peribronchial thickening without focal air space disease.   DISCHARGE LABORATORY VALUES:  White blood cell count was 12.0, hemoglobin  12.9, hematocrit 38, platelets 309. Sodium was 141, potassium 4.5, chloride  103, bicarb 28, glucose 145, BUN 10, creatinine 0.9.   HISTORY OF PRESENT ILLNESS:  The patient is a 30 year old female with a  known history of moderate persistent bronchial asthma who presented to the  emergency department with acute onset of tightness in her chest, progressive  dyspnea and increased wheezing. She attempted to use her home nebulizer  treatments to reverse this and after several hours, presented to the  emergency department with ongoing complaints. The patient also reports  reported that the trigger for the acute onset of the tightness in her chest  and wheezing was being exposed to excessive heat in a laundromat where  people were smoking.   HOSPITAL COURSE BY PROBLEM.:  1.  ACUTE ASTHMA EXACERBATION:  The patient was admitted after a trial of      continuous  bronchodilator therapy in the emergency department failed to      reverse her bronchospasm. She was empirically treated with steroids,      broad-spectrum antibiotics, bronchodilator therapy, Singulair and      Zyrtec. She began to improve on hospital day #3. Her steroids were      gradually tapered and she was discharged on a further p.o. taper.  2.  GASTROESOPHAGEAL REFLUX DISEASE:  Remained stable with proton pump      inhibitor therapy.  3.  HYPOKALEMIA:  The patient was slightly hypokalemic on admission and this      was repleted.  4.  CONSTIPATION:  The patient responded to mild stool softener therapy.   DISPOSITION:  The patient is discharged home with home health nursing  services to help her care for her small children. She is to return to Dr.  Andrey Campanile in 1-2 weeks for further medical follow-up. She was advised to get a  referral for allergy testing as well. She was also advised to check her peak  flow  several times a day and to return to the emergency department for any  new onset of shortness of breath, increased wheezing, increased sputum  production or decrease in peak flows.   CONDITION ON DISCHARGE:  Improved.           ______________________________  Hillery Aldo, M.D.     CR/MEDQ  D:  12/21/2004  T:  12/22/2004  Job:  [id number]

## 2010-06-02 NOTE — Discharge Summary (Signed)
[name], [name]               ACCOUNT NO.:  [id number]   MEDICAL RECORD NO.:  [id number]          PATIENT TYPE:  INP   LOCATION:  5715                         FACILITY:  [facility]   PHYSICIAN:  Leighton Roach McDiarmid, M.D.DATE OF BIRTH:  10/07/1980   DATE OF ADMISSION:  05/05/2004  DATE OF DISCHARGE:  05/08/2004                                 DISCHARGE SUMMARY   DISCHARGE DIAGNOSES:  1.  Asthma exacerbation.  2.  Kidney stones.   DISCHARGE MEDICATIONS:  1.  Advair Diskus, 500/50 mcg) two puffs b.i.d.  2.  Singulair (10 mg) one tab p.o. daily.  3.  Zyrtec (10 mg)  one tab p.o. daily.  4.  Albuterol p.r.n.  5.  Prednisone (50 mg) one tab p.o. on April 25 to complete a course of five      days.  6.  Percocet (5/325) one tab p.o. q.4-6h. p.r.n. pain.   HISTORY/HOSPITAL COURSE:  This is a 30 year old African-American female with  history of moderate persistent asthma and multiple ED admissions for asthma  exacerbation secondary to URI, presented [facility] ER [facility] with two-  day history of runny nose and unproductive cough.  She denied fever.  On the  morning of admission the patient developed shortness of breath and dyspnea.  The patient had twice albuterol nebulizers at home.  The patient received by  EMS team IV Solu-Medrol and two more treatments with albuterol nebulizers.  At the emergency room the patient received Atrovent and albuterol/Xopenex  nebulizers, followed by continuous albuterol nebulizations.   PHYSICAL EXAMINATION:  VITAL SIGNS:  On admission temperature 98.6, pulse  112, respiratory rate 20, O2 saturations 99% on 2 L of oxygen.  Blood  pressure was 122/67.  GENERAL:  In general the patient was awake, not speaking in full sentences,  in moderate respiratory distress.  RESPIRATORY:  Exam showed increased work of breathing with prolonged  expiratory phase.  Decreased air entry with diffuse wheezes.  No crackles.   PROBLEM LIST:  1.  Asthma exacerbation.  The  patient was admitted to telemetry bed.  The      patient received prednisone 50 mg p.o. daily.  Xopenex nebulizations      every six hours and albuterol nebulizers q.4h., scheduled q.2h. p.r.n.      The patient's response to the treatment was favorable.  O2 saturations      went up to 91% on room air.  Work of breathing and shortness of breath      improved.  Nebulizations were changed to q.6h. of albuterol and q.2h.      p.r.n.  On day of discharge the patient was stable.  O2 saturations were      94% on room air.  The patient by date of discharge completed a four-day      course of prednisone.  The patient is discharged home on home medication      regimen and one more day of prednisone p.o.  The patient has a follow-up      with Dr. Michae Kava in one week at [facility].  2.  Kidney stones.  On April 23 the patient complained of left back pain      that irradiated to the pelvic and left labia minora.  Urinalysis showed      oxylate calcium crystals.  An abdominal pelvic CT scan showed one or two      tiny renal stones on the left kidney.  No ureteral stones or other acute      findings.  The patient is advised to increase fluid intake.  This issue      is to be followed up by primary physician.  The patient is sent home on      Percocet (5/225) one tab p.o. q.4-6h. p.r.n. pain.   CONDITION ON DISCHARGE:  Stable.   FOLLOW UP:  The patient has a follow-up appointment with Dr. Michae Kava at  [facility] on May 2 at 8:30 a.m. for follow-up  asthma and kidney stone.   PROCEDURES:  Abdominal CT scan that showed 1-2 tiny renal stones on the left  kidney.  No ureteral stones or other acute findings.      FIM/MEDQ  D:  05/08/2004  T:  05/08/2004  Job:  [id number]

## 2010-06-02 NOTE — H&P (Signed)
[place]  Patient:    [name], [name]                      MRN: [id number] Adm. Date:  [id number] Attending:  Cecil Cranker Dictator:   Solon Palm, M.D.                         History and Physical  CHIEF COMPLAINT:  Shortness of breath.  HISTORY OF PRESENT ILLNESS:  The patient is an 30 year old African-American female who presents with an acute asthma exacerbation.  The patient has a long history of asthma beginning when she was 30 years old.  She states that over the past several days has had an increasing need for her inhalers and she ran out yesterday and has been using Primatene Mist without improvement.  The patient states she has multiple ED visits in the past, her last admission for an asthma exacerbation was back in February.  Upon arrival to the ER, Dr. Colon Branch evaluated the patient.  She was given a dose of p.o. prednisone, several albuterol nebulizer treatments without improvement.  O2 saturations on arrival were 90%.  After continuous nebulizer treatment over the course of an hour, her oxygen saturations only rose to 92% and per Dr. Colon Branch still wheezing, therefore, call to assess the patient for admission.  The patient states she does feel better.  She still feels wheezy, relates the above story. States that she has had increasing allergy symptoms over the past week or so. Also relates that she has had increasing physical activity lateley with beginning a weight training class and she has been running which has worsened her asthma and she has had an increased requirement for her albuterol MDI inhaler.  Otherwise, the patient has no other medical complaints.  REVIEW OF SYSTEMS:  CONSTITUTION: No recent weight change.  CARDIOVASCULAR: No chest pain.  RESPIRATORY: As above.  SKIN: No rashes.  GASTROINTESTINAL: Normal BMs.  No problems.  MUSCULOSKELETAL: Normal.  Occasional back pain associated with asthma exacerbations.   GENITOURINARY: Positive nocturia which is a new finding for her over the two weeks.  No dysuria.  Positive frequency increasing over the course of the night.  HEENT: Positive rhinitis which is chronic, increased recently.  PAST MEDICAL HISTORY:  Allergic rhinitis and asthma.  MEDICATIONS: 1. Albuterol MDI p.r.n. 2. Allegra 60 mg b.i.d. p.r.n. 3. Flovent 20 mcg two puffs b.i.d. 4. Nasonex p.r.n. 5. Serevent two puffs b.i.d. 6. Singulair 10 mg q.d.  ALLERGIES:  No known drug allergies.  SOCIAL HISTORY:  Lives with mother and two sisters.  Dad rarely involved.  No smoking in house.  Central air, electric heat, and carpet.  Denies smoking, alcohol, or sexual activity.  FAMILY HISTORY:  Allergic rhinitis in sister and dad and grandmother.  Obesity in mother and sister.  Diabetes in both grandparents.  PAST SURGICAL HISTORY:  Peak flow was 420.  Also had Norplant placed in [place] in April of 1999.  PHYSICAL EXAMINATION:  VITAL SIGNS:  O2 saturations 94%.  GENERAL:  The patient appears comfortable, is pleasant, in no acute distress. Insight and judgment are intact.  HEENT:  Sclerae are nonicteric.  Pupils are equal, round and reactive to light.  Nares shows some mild erythema of the nares with clear rhinorrhea appreciated.  Oropharynx; dentition is well.  Mucous membranes are moist.  NECK:  Shows no masses and no thyromegaly.  LUNGS: Bilateral inspiratory expiratory  wheezes with fair air movement diffusely.  HEART:  Regular rate and rhythm with no murmurs appreciated.  MUSCULOSKELETAL:  No joint pain on palpation.  Full range of motion.  No cyanosis and no edema.  ABDOMEN:  Soft and nontender and nondistended.  SKIN:  Shows no rash.  LYMPH:  No lymphadenopathy.  NEUROLOGICAL:  Cranial nerves II-XII intact.  LABORATORY DATA:  BMET is pending and CBC is pending.  ASSESSMENT: 1. An 30 year old African-American female with asthma exacerbation. [name] admit    to a  regular floor for continued observation and respiratory treatments.    Anticipate this to be a 24-hour admission.  [name] continue the patients    prednisone.  [name] also adjust the patients medications to increase    compliance.  [name] discontinue Flovent and Serevent and add Advair 500/50.    [name] also check a chest x-ray for possible etiology for this exacerbation.    [name] also encourage the patients compliance go over some asthma education    while inpatient here. 2. Nocturia - urinary frequency.  [name] check a UA and also follow a BMET    for abnormal glucose.  DISPOSITION:  Anticipate discharge home tomorrow. DD:  11/08/99 TD:  11/08/99 Job: [id number]

## 2010-06-02 NOTE — Discharge Summary (Signed)
[name], WORTH                         ACCOUNT NO.:  [id number]   MEDICAL RECORD NO.:  [id number]                   PATIENT TYPE:  INP   LOCATION:  5027                                 FACILITY:  [facility]   PHYSICIAN:  Dalbert Mayotte, M.D.                 DATE OF BIRTH:  09-16-1980   DATE OF ADMISSION:  01/06/2002  DATE OF DISCHARGE:  01/07/2002                                 DISCHARGE SUMMARY   DISCHARGE DIAGNOSES:  1. Asthma exacerbation.  2. Medical non-compliance secondary to financial constraints and poor social     situation.  3. Allergic rhinitis.   DISCHARGE MEDICATIONS:  1. Advair Diskus 500/50 one puff p.o. b.i.d.  2. Singulair 10 mg one p.o. q.d.  3. Proventil MDI one to two puffs q.4-6h. p.r.n. shortness of breath.  4. Zyrtec 10 mg p.o. q.d.  5. Prednisone 60 mg p.o. q.d. x 2 days, then 40 mg q.d. x 2 days, then 20 mg     q.d. x 2 days, then 10 mg q.d. x 2 days, then none.   HISTORY OF PRESENT ILLNESS:  Please see the admission history and physical  for complete details.  In brief, this is a 30 year old African-American  female with a known history of asthma who presented to [facility] emergency  room with history of increasing shortness of breath.  She had been seen  initially in the emergency room the night prior but felt as though she was  feeling a little bit better, left the emergency room and then had evidently  several exacerbations back-to-back and was taken to the emergency room by  her co-workers.  She had been using the albuterol as frequently as every  five minutes without any relief and did not get much relief in the emergency  room after a nebulizer treatment so was therefore asked to be admitted.  Her  last peak flow was 160 two days ago.   LABORATORY DATA ON ADMISSION:  White count 15 with ANC of 11.1, hemoglobin  12.7.  Chest x-ray showed slightly increased perihilar markings,  questionable asthma versus bronchitis without any significant  infiltrate.   HOSPITAL COURSE:  1. Asthma exacerbation.  Given she was not very responsive to the albuterol,     we did admit her, placed her on continuous pulse oximetry, did start her     on IV Decadron, continued her on albuterol nebulizers q.2h. and q.1h.     p.r.n. along with one treatment of Atrovent.  She was placed on oxygen     and monitored her peak flows.  She continued to do very well with much     improvement in her shortness of breath.  She maintained her oxygen     saturation in the mid 90's on 2 L and then on the day of admission was     weaned off onto room air and was saturating anywhere  from 95% to 97% on     room air.  She did report she was feeling much less short of breath.  Her     steroids were changed over to p.o. on the day of discharge, and she was     felt stable for discharge home.  Given her financial constraints and     recently being taken off of Medicaid, she was given samples of Advair     Diskus, Singulair and Zyrtec.  She did have her Proventil inhaler with     her and was instructed on the importance of taking these medications.  2. Allergic rhinitis.  As mentioned above she was sent out on Zyrtec 10 mg a     day and Singulair samples were given.  3. Medical noncompliance secondary to financial constraints.  We did talk at     length about the importance of keeping her appointment with the [facility]     reappointment office as she is still looking into doing this but     evidently failed an appointment.  We will pass this along to her primary     care physician, Dr. Milinda Cave, so that he can assure she does get hooked     into some type of medication assistance program if Medicaid is denied     again.   DISCHARGE INSTRUCTIONS:  1. Activity:  She is to increase her activity as tolerated.  2. Follow-up:  She is scheduled for follow-up at [facility]     with Dr. Santiago Bumpers on January 5 at 3 o'clock p.m.  She is to return or     call our  office sooner should she develop any increasing shortness of     breath or wheezing.                                               Dalbert Mayotte, M.D.    AS/MEDQ  D:  01/07/2002  T:  01/09/2002  Job:  [id number]   cc:   Jeoffrey Massed, M.D.  [facility]  [place], [place] [address]  Fax: [phone]

## 2010-06-02 NOTE — Discharge Summary (Signed)
NAMEJILLEEN, [name]                         ACCOUNT NO.:  [id number]   MEDICAL RECORD NO.:  [id number]                   PATIENT TYPE:  INP   LOCATION:  5010                                 FACILITY:  [facility]   PHYSICIAN:  Wayne A. Sheffield Slider, M.D.                 DATE OF BIRTH:  [birth year]   DATE OF ADMISSION:  08/23/2003  DATE OF DISCHARGE:  08/25/2003                                 DISCHARGE SUMMARY   ADMISSION DIAGNOSIS:  Asthma exacerbation.   DISCHARGE DIAGNOSES:  1. Asthma.  2. Rhinitis.  3. Paresthesias.   DISCHARGE MEDICATIONS:  1. Advair 500 mcg 2 puffs 2 times a day.  2. Proventil MDI p.r.n. as needed for wheezing.  3. Singulair 10 mg p.o. daily.  4. Zyrtec 10 mg p.o. daily.  5. Prednisone taper with the following regimen:  Day 1 through 3 -- 60 mg     p.o.; the following 3 days, August 26, 2003 through August 28, 2003 -- 40     mg p.o. daily; the next 3 days, August 29, 2003 through August 31, 2003 -     - 20 mg p.o. daily.   HISTORY OF PRESENT ILLNESS:  The patient is a 30 year old African American  female with history of multiple admissions for asthma exacerbations, unknown  triggers, possibly heat.  She was seen last week at [facility] ED and was  treated with prednisone and Tussionex, but had gotten worse.  She does have  a productive cough and yellowish-green sputum.  She was using her albuterol  MDI 5 times without improvement.  She denies sick contacts or upper  respiratory symptoms, except for a cough which was productive of yellowish-  green and positive shortness of breath, and positive wheezing, but denies  fevers, chills or sick contacts.  She does complain of pleuritic chest pain,  but denies focal weakness, numbness or tingling, constipation, diarrhea,  nausea or vomiting.   HOSPITAL COURSE:  She was admitted and put on continuous nebulizers with  Solu-Medrol in emergency department without improvement and was transferred  to the hospital floor, where  she was given Atrovent and albuterol nebulizers  q.4 h. with q.2 h. p.r.n. and was put on p.o. prednisone.  A chest x-ray was  ordered which ruled out infectious cause.  Respiratory therapy continued to  treat the patient and measure peak flows, which initially on  August 23, 2003, pre peak flow of 130, post treatment 180, that was at 7:30; at 11:30,  pre was 140, post was 160; and at 1530, pre was 190, post was 200.  The  patient continued to have wheezes, though she improved; respiratory-wise,  she had decreased work of breathing and saturations improved.  August 24, 2003, pre-treatment was 250, post-treatment was 300, and on August 25, 2003,  pre-treatment was 250, post-treatment was 310 and 300 peak flow is her  baseline, thus the  patient was discharged with followup.  Discharge  medications as above.   DISCHARGE CONDITION:  Stable.   DISCHARGE INSTRUCTIONS:  Take medications regularly and finish prednisone  taper.   FOLLOWUP:  Follow up with Dr. Douglass Rivers at [facility],  Tuesday, August 31, 2003, at 9:15 a.m.      Tawnya Crook Erenest Rasher, M.D.               Wayne A. Sheffield Slider, M.D.    MBV/MEDQ  D:  08/25/2003  T:  08/26/2003  Job:  [id number]   cc:   Douglass Rivers, M.D.  [facility].  Family [facility] Resident  [place], [place] [address]  Fax: [phone]

## 2010-06-02 NOTE — H&P (Signed)
[name], [name]               ACCOUNT NO.:  [id number]   MEDICAL RECORD NO.:  [id number]          PATIENT TYPE:  INP   LOCATION:  0101                         FACILITY:  [facility]   PHYSICIAN:  Melissa L. Ladona Ridgel, MD  DATE OF BIRTH:  [birth year]   DATE OF ADMISSION:  06/04/2004  DATE OF DISCHARGE:                                HISTORY & PHYSICAL   CHIEF COMPLAINT:  Shortness of breath.   PRIMARY CARE PHYSICIAN:  Douglass Rivers, M.D., at the [facility]  at [facility].   HISTORY OF PRESENT ILLNESS:  The patient is a 30 year old African-American  female with a known history of asthma. She states that she went to the park  today with her son, and when she came home, she noticed that she was short  of breath. The patient states that she used her albuterol nebulizer which  made her feel better for a little while, but then she started to feel sick  to her stomach and short of breath again. The patient states that she felt  the same way she felt last month when she needed to be in the hospital, and  so she came to the emergency room for further evaluation. In the ED, the  patient received magnesium sulfate, nebulizers, and prednisone, but she  continued to wheeze. It was therefore deemed necessary that she be admitted  to the hospital for further treatment.   REVIEW OF SYSTEMS:  She denies fevers, chills, vomiting, or diarrhea but had  some nausea. She denies any dysuria. She states her last menstrual period  was completed yesterday. She denies any sick contact including her child.   PAST MEDICAL HISTORY:  1.  Asthma.  2.  Acid reflux.   She was last admitted sometime last month. She has never been intubated for  her asthma.   PAST SURGICAL HISTORY:  Initially, she stated none; however, she is gravida  1, para 1 with a Cesarean section.   ALLERGIES:  No known drug allergies.   MEDICATIONS:  1.  Albuterol nebulizers.  2.  Albuterol metered dose inhaler.  3.   Singulair.  4.  Prilosec unknown amount once daily.  5.  Advair 500/50 b.i.d.   SOCIAL HISTORY:  She denies tobacco, ethanol, or illicit drug use.   FAMILY HISTORY:  Mom and dad are both living with no health problems that  she is aware of.   PHYSICAL EXAMINATION:  VITAL SIGNS:  Temperature 98.7, blood pressure  128/66, pulse 92, respirations 18, saturation 96%.  GENERAL:  She is no acute distress and is resting comfortably.  HEENT:  She is normocephalic, atraumatic. Pupils are equal, round, and  reactive to light. Extraocular muscles are intact. Mucous membranes are  moist. She has no pharyngitis.  NECK:  Supple. There is JVD. No lymph nodes and no carotid bruits.  CHEST:  Her chest reveals inspiratory and expiratory wheezes. Decreased air  entry at the bases.  CARDIOVASCULAR:  Regular rate and rhythm. Positive S1 and S2. No S3 or S4.  No murmurs, rubs, or gallops.  ABDOMEN:  Soft, nontender, nondistended  with positive bowel sounds.  EXTREMITIES:  Show no clubbing, cyanosis, or edema.  NEUROLOGICAL:  She is awake, alert, oriented x3. Cranial nerves II-XII are  intact. Power is 5/5.   LABORATORY DATA:  Current laboratories reveal white count of 11.7,  hemoglobin of 12.4, hematocrit 37.6, platelets are 347, neutrophils are 95%.  Her blood gas reveals a pH of 7.36, pCO2 of 38.4, pO2 of 92.1, bicarb of  21.5. Chest x-ray reveals no acute disease. She does have 95% neutrophils.  Her BMET is pending.   ASSESSMENT:  This is a 30 year old African-American female with asthma  exacerbation, minimal responsive to nebulizers, magnesium sulfate, and  prednisone. She does have an elevated white count and increased neutrophils.   1.  Pulmonary. Asthma exacerbation, moderate. The patient is stable but slow      to respond to treatment. We will therefore admit her to telemetry for      q.2h. nebulizers. Steroids can remain at oral prednisone at this time,      and I would like to start her  ceftriaxone azithromycin as well as      treatment with flutter valve.  2.  Cardiovascular. Stable heart rate. No past medical history of concern.  3.  Gastrointestinal. Gastroesophageal reflux disease. Will start on      Protonix b.i.d. p.o.  4.  Genitourinary. Will check a UA, C&S, and check a urine pregnancy even      though her last menstrual period was reported as being yesterday.  5.  Infectious disease. As stated, start her on ceftriaxone azithromycin.      Will reevaluate her in the morning and consider whether or not she needs      to have IV steroids versus oral steroids at this time. She is stable.      MLT/MEDQ  D:  06/05/2004  T:  06/05/2004  Job:  [id number]

## 2010-06-02 NOTE — Discharge Summary (Signed)
[name], [name]                         ACCOUNT NO.:  [id number]   MEDICAL RECORD NO.:  [id number]                   PATIENT TYPE:  INP   LOCATION:  5022                                 FACILITY:  [facility]   PHYSICIAN:  Leighton Roach McDiarmid, M.D.             DATE OF BIRTH:  [birth year]   DATE OF ADMISSION:  11/24/2001  DATE OF DISCHARGE:  11/24/2001                                 DISCHARGE SUMMARY   DISCHARGE DIAGNOSES:  1. Asthma exacerbation.  2. Tobacco abuse.  3. Allergies.   DISCHARGE MEDICATIONS:  1. Albuterol inhaler with spacer two puffs q.4h. p.r.n. for shortness of     breath.  2. Advair Diskus 100/50 mcg inhaled b.i.d.  3. Singulair 10 mg one p.o. every day.  4. Claritin one p.o. every day.   SPECIAL DISCHARGE INSTRUCTIONS:  The patient was sent home with a peak flow  meter and instructed to use this daily to determine her best peak flow and  she is to record this for Dr. Jeoffrey Massed, who she will follow up with  on Thursday at 11:40 to discuss financial concerns with obtaining her asthma  medications.   HOSPITAL COURSE:  The patient is a 30 year old African American with a  history of asthma since the age of 5, who says that she has been out of her  medications for approximately two weeks.  The patient says that she did have  Medicaid at one time which covered her medicines but no longer has Medicaid.  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  The  patient said that her asthma has been getting worse over the past couple of  weeks and she has been to the emergency department three times in the past  two and a half weeks.  During this admission, the patient was given a one-  hour albuterol nebulizer with minimal improvement and thus, the patient was  admitted.  The patient was also started on an Atrovent nebulizer x3 and  given a steroid dose of 60 mg p.o. every day.  Social work was asked to see  the patient regarding financial  concerns.  The patient was also given a peak  flow meter and reinstructed on how to use the meter to determine her best  peak flow record.  We also discussed her green, yellow and red zones.  This  should be further discussed and her ranges determined by Dr. Milinda Cave on  Thursday after determining her best peak flow after she improves from this  acute exacerbation.  It was also discussed the importance of changing her  lifestyle if necessary to be able to afford her asthma medication, since  asthma can be extremely life-threatening in an acute event.  The patient  agreed about the importance of making these changes to be able to afford her  medications.  The patient will follow up with Dr. Milinda Cave  for further  evaluation and assistance.  The patient was also given samples of the  Singulair and Claritin.     [name], M.D.                  [name], M.D.    CM/MEDQ  D:  11/24/2001  T:  11/25/2001  Job:  [id number]

## 2010-06-02 NOTE — Discharge Summary (Signed)
[name], [name]                         ACCOUNT NO.:  [id number]   MEDICAL RECORD NO.:  [id number]                   PATIENT TYPE:  INP   LOCATION:  9102                                 FACILITY:  [facility]   PHYSICIAN:  Emmit Alexanders, M.D.                      DATE OF BIRTH:  [birth year]   DATE OF ADMISSION:  01/14/2003  DATE OF DISCHARGE:  01/17/2003                                 DISCHARGE SUMMARY   DISCHARGE DIAGNOSES:  A 30 year old gravida 1, now para 1-0-0-1.   PROCEDURE:  Primary low transverse cesarean section secondary to  nonreassuring fetal heart tracings.   HOSPITAL COURSE:  This is a 30 year old G1, now P1 who presented to [facility] in active labor.  She was found on vaginal  exam to  have 4 cm dilation with contractions every three to four minutes.  She did  have bloody show by history.  However, she was found to have nonreassuring  fetal heart tracings during her labor.  There was weight component  variables.  An IUPC and fetal scalp electrode was placed.  Her membranes  were ruptured with clear fluid.  However, the patient progressed to have  continued late decelerations to fetal heart tracings of 60s.  She was  therefore taken to the operating room for primary low transverse cesarean  section. This was performed by Dr. Penne Lash and assisted by Dr. Okey Dupre.  There  were no complications.  Estimated blood loss was 800 mL.  Placenta was sent  for pathology.  This was productive for a viable female infant with Apgars of  8 and 8 at one and five minutes respectively.  The patient had normal  uterus, fallopian tubes, ovaries.  The NICU Team was at delivery.  The  patient did have a tight nuchal cord x1.  Postoperatively, the patient did  quite well.  Her vitals remained stable and she was afebrile.  She  progressed to tolerating a regular diet without vomiting.  She was passing  gas at time of discharge.  She had not had a bowel movement  yet.  She was  ambulating.  Her incision was clean, dry and intact.  Staples were removed  at discharge.  These were replaced with Steri-Strips.  The patient had  minimal lochia at discharge.  Her physical exam remained normal.  The  patient is bottle feeding her baby.  Circumcision was done on the baby prior  to discharge.   Discharge CBC showed a white blood cell count of 13.3, hemoglobin 10.5,  hematocrit 31.2, antiplatelet count of 183.  The patient was RPR negative.  The patient was GBS positive and treated with antibiotics during delivery.   MEDICATIONS:  1. Percocet 5/325 mg one or two tablets every six hours as needed for pain.     #20, no refills.  2. Ortho Evra patch to use as  directed.  3. Ibuprofen 600 mg every six hours as needed.   WOUND CARE:  Per instructions booklet.   DIET:  Regular diet as tolerated.   ACTIVITY:  Per instruction booklet.  This includes sexual activity with  nothing in vagina for the next six weeks.   FOLLOW UP:  The patient is to call the [facility] to  set up an appointment for her baby to see Dr. Michae Kava in one week as well as  to make an appointment for herself in six weeks.  She is to call sooner if  any problems arise.                                               Emmit Alexanders, M.D.    DV/MEDQ  D:  01/17/2003  T:  01/18/2003  Job:  [id number]   cc:   Douglass Rivers, M.D.  [facility].  Family Prac. Resident  [place], [place] [address]  Fax: [phone]

## 2010-06-02 NOTE — H&P (Signed)
[name], [name] NO.:  [id number]   MEDICAL RECORD NO.:  [id number]          PATIENT TYPE:  INP   LOCATION:  0102                         FACILITY:  [facility]   PHYSICIAN:  Isidor Holts, M.D.  DATE OF BIRTH:  [birth year]   DATE OF ADMISSION:  04/27/2005  DATE OF DISCHARGE:                                HISTORY & PHYSICAL   PMD:  Dr. Dellis Anes of [facility].   CHIEF COMPLAINT:  Shortness of breath for two days.  Also wheeze and cough  productive of greenish phlegm.   HISTORY OF PRESENT ILLNESS:  This is a 30 year old female, known asthmatic,  who states that she was feeling quite well until April 26, 2005, when she  went to visit a friend who smokes.  She spent all day at her friend's house  and by the evening of the same day, had started feeling chest tightness.  She went home the next morning with increasing chest tightness and mild  shortness of breath.  When she got to her home, she smelled natural gas, and  it appears that she did have a gas leak in her home.  She has had this taken  care of, but later in the day, she had a shower, and when she came out of  the shower, she had become very short of breath and wheezy.  She therefore  called EMS.   On further questioning, it appears that the patient has had a cough  productive of greenish-brownish phlegm for the past two days but denies  fever or chest pain.   PAST MEDICAL HISTORY:  1.  Moderately persistent bronchial asthma.  2.  Seasonal allergic rhinitis.  3.  GERD.   MEDICATIONS:  1.  Advair discus (500/50) 1 puff b.i.d.  2.  Albuterol MDI/Atrovent MDI p.r.n.  3.  Albuterol neb p.r.n.  4.  Singulair 10 mg p.o. daily.  5.  Prilosec 20 mg p.o. daily.  6.  Zyrtec 10 mg p.o. daily.   ALLERGIES:  No known drug allergies.   REVIEW OF SYSTEMS:  Essentially as per HPI and chief complaint, otherwise  negative.   FAMILY HISTORY/SOCIAL HISTORY:  Patient is currently on  disability.  She is  single.  Has one son who has eczema.  She is a nonsmoker.  Drinks alcohol  only occasionally.  Denies drug abuse.  She has two sisters who are alive  and well.  Both parents are alive and well.   PHYSICAL EXAMINATION:  VITALS:  Temperature 98.9, pulse 96 per minute and  regular, respiratory rate 23, BP 134/71 mmHg.  Pulse oximetry 96% on room  air.  GENERAL:  Patient appears in no obvious acute respiratory distress.  Is able  to communicate in complete sentences.  Alert.  Coughs intermittently.  HEENT:  No pallor.  No jaundice.  No conjunctival injection.  Throat is  clear.  NECK:  Supple.  JVP not seen.  No palpable lymphadenopathy.  No palpable  goiter.  CHEST:  Bilateral polyphonic expiratory wheezes.  No crackles.  HEART:  Heart sounds are heard, normal,  regular, no murmurs.  ABDOMEN:  Moderately obese.  Soft and nontender.  No palpable organomegaly.  No palpable masses.  Normal bowel sounds.  EXTREMITIES:  Lower extremity examination:  No pitting edema.  Palpable  peripheral pulses.  MUSCULOSKELETAL:  Quite unremarkable.  CENTRAL NERVOUS SYSTEM:  No focal neurologic deficits on gross examination.  SKIN:  Unremarkable.   INVESTIGATIONS:  CBC:  WBC 13.9, neutrophils 92%, hemoglobin 12.8,  hematocrit 38.2, platelets 262.  Electrolytes:  Sodium 139, potassium 3.4,  chloride 107, CO2 27, BUN 7, creatinine 0.7, glucose 120.   Chest x-ray dated April 27, 2005 is normal, apart from very small pleural  effusion in the lateral view.   ASSESSMENT/PLAN:  1.  Flare of bronchial asthma:  We shall admit patient.  Treat with      bronchodilator nebulizers, oxygen supplementation, parenteral steroids,      antihistamines, and continue Singulair.   1.  Acute bronchitis:  Likely contributory to #1 above.  This is supported      by history of a cough productive of greenish phlegm as well as      leukocytosis.  We shall therefore add Avelox treatment and also Mucinex.    Further management will depend on clinical course.      Isidor Holts, M.D.  Electronically Signed     CO/MEDQ  D:  04/27/2005  T:  04/27/2005  Job:  [id number]   cc:   [facility]

## 2010-06-02 NOTE — Discharge Summary (Signed)
[name], [name] NO.:  [id number]   MEDICAL RECORD NO.:  [id number]          PATIENT TYPE:  INP   LOCATION:  0344                         FACILITY:  [facility]   PHYSICIAN:  Sherin Quarry, MD      DATE OF BIRTH:  [birth year]   DATE OF ADMISSION:  03/11/2004  DATE OF DISCHARGE:  03/13/2004                                 DISCHARGE SUMMARY   HISTORY OF PRESENT ILLNESS:  [name] is a 30 year old woman who  initially presented to [facility] on February 25 with complaints  of productive cough, shortness of breath, and expiratory wheezing.  She had  a past history of asthma.  In the emergency room the patient continued to  wheeze in spite of receiving multiple nebulizer treatments.  She was noted  to have an O2 saturation of 91%.  Chest x-ray was normal but the patient's  arterial blood gases showed a PO2 of 60 on room air, and therefore it was  felt prudent to admit her to the hospital.   PHYSICAL EXAMINATION:  VITAL SIGNS:  Her temperature was 98.2, blood  pressure was 132/92, pulse 98, respirations 20, O2 saturation was 91%.  HEENT:  Within normal limits.  CHEST:  Examination of the chest revealed diffuse expiratory wheezing.  CARDIOVASCULAR:  Revealed normal S1 and S2.  There were no rubs, murmurs, or  gallops.  ABDOMEN:  Benign.  There were normal bowel sounds without masses or  tenderness.  NEUROLOGIC/EXTREMITIES:  Neurologic testing and examination of extremities  was normal.   LABORATORY DATA:  Relevant studies obtained included a CBC which revealed a  white count of 10,700, hemoglobin 12.6.  Sodium was 141, potassium 3.4,  chloride 110, CO2 27.  Creatinine was 0.8, BUN was 9, glucose was 131.   HOSPITAL COURSE:  On admission Dr. Nehemiah Settle placed the patient on azithromycin  500 mg IV daily, Solu-Medrol 60 mg IV every eight hours, oxygen at 2 L by  nasal prongs, Singulair 10 mg daily, and incentive spirometry.  She also  received albuterol  nebulization 2.5 mg every six hours.  A Lovenox  prophylaxis was instituted.  On February 26 I subsequently saw the patient.  At that time she was feeling considerably better.  My plan was going to be  to monitor her in the hospital; however, on February 27 at 8 p.m. the  patient signed out of the hospital against medical advice.  Because of this  the patient did not receive any discharge instructions.   MEDICATIONS:  1.  She did not receive any discharge medications and I presume that      therefore she will continue her usual medications which consist of an      Advair inhaler one puff b.i.d.  2.  Singulair 10 mg daily.  3.  Albuterol metered dose inhaler three puffs q.i.d.  4.  Zyrtec 10 mg daily.  5.  Prilosec p.r.n.   DISCHARGE DIAGNOSES:  1.  Acute asthma exacerbation.  2.  Chronic asthma.  3.  History of gastroesophageal reflux.  4.  Obesity.   CONDITION ON  DISCHARGE:  Fair.      SY/MEDQ  D:  04/05/2004  T:  04/05/2004  Job:  [id number]

## 2010-06-02 NOTE — Discharge Summary (Signed)
[name], [name]                         ACCOUNT NO.:  [id number]   MEDICAL RECORD NO.:  [id number]                   PATIENT TYPE:  INP   LOCATION:  5017                                 FACILITY:  [facility]   PHYSICIAN:  Barney Drain, M.D.                 DATE OF BIRTH:  October 25, 1980   DATE OF ADMISSION:  06/15/2002  DATE OF DISCHARGE:  06/16/2002                                 DISCHARGE SUMMARY   DISCHARGE DIAGNOSES:  1. Acute asthma exacerbation.  2. Intrauterine pregnancy at 9-1/7 weeks dated by last menstrual period.   DISCHARGE MEDICATIONS:  1. Advair 500/50 one puff b.i.d., rinse mouth after use.  2. Albuterol MDI with spacer 1-2 puffs q.2-4h. p.r.n.  3. Prednisone 20 mg 1 tablet p.o. daily x3 more days.  4. Prenatal vitamins 1 tablet p.o. daily with food.   SPECIAL INSTRUCTIONS:  The patient is to avoid alcohol and smoking.  Check  peak flows before and after each albuterol use.  Check peak flows each  morning.   DISPOSITION:  The patient was discharged home in stable condition.   FOLLOW UP:  1. June 22, 2002, at 1:55 p.m. with Dr. Milinda Cave for asthma checkup.  2. [facility] will call the patient with an appointment for her     prenatal care.   HISTORY AND PHYSICAL:  [name] is a 30 year old obese female with history  of asthma who presented for the third time in the past week for an asthma  exacerbation.  She reportedly stopped taking her Advair three weeks prior to  presentation when she ran out of medication, could not afford more, and was  concerned about the medication's affect on her pregnancy.  She has noted  increasing use of her albuterol, increasing shortness of breath with  exertion, and increasing dry cough.  She is often followed by Dr. Milinda Cave,  her primary care physician, since December 2003.   PHYSICAL EXAMINATION:  Pertinent findings on physical examination.  VITAL SIGNS:  Temperature of 100.0, heart rate of 124, respiratory rate  28,  blood pressure 127/81.  O2 saturations 98% on 8 L of O2.  LUNGS:  Showed bilateral diffuse loud expiratory wheeze, no crackles or  rhonchi after emergency room treatment.   LABORATORY DATA:  Pertinent laboratory findings included an I-stat ABG  showing pH of 7.3, PaO2 of 46.1, bicarb of 23, white count 15.9, hemoglobin  13.1, platelets 336.  Her ANC was 10.  Sodium 137, potassium 3.9, chloride  104, bicarb 23, BUN 4, creatinine 0.7, glucose 112.  Chest x-ray shows mild  hyperinfiltration.  Otherwise, no apparent disease.   HOSPITAL COURSE:  1. Acute asthma exacerbation.  The patient was given continuous albuterol     nebulizer treatments in the emergency department, was slowly weaned back     to q.1h. p.r.n., albuterol nebulizer given x2, Brethine nebulizer with     saline  x1, and subcutaneous injection of Brethine x1.  Solu-Medrol course     was begun.  The patient also received 2 g of magnesium sulfate IV x1.     Supplemental oxygen was provided until the patient's symptoms had     resolved and she was able to maintain adequate saturation on room air.     At the time of discharge, she was saturating 98% on room air.  The     patient was given a two-day course of azithromycin.  As the patient's     respiratory status improved, her nebulizer treatments were spaced out and     we were able to successfully wean her oxygen.  She is to be discharged     home back on her Advair, albuterol with spacer as needed; completion of     five-day prednisone first.  The patient was instructed once again on the     use of peak flow monitoring.  2. Intrauterine pregnancy at 9-1/7 weeks.  The patient had previously been     seen at [facility].  Would like to transfer care back to [facility].  Were unable to secure an appointment for the patient     prior to discharge.  [facility] will contact the patient     with that time.  3. Medical noncompliance.  The social  worker was consulted for financial     need and medicaid application.                                               Barney Drain, M.D.    SS/MEDQ  D:  06/16/2002  T:  06/16/2002  Job:  [id number]   cc:   Jeoffrey Massed, M.D.  [facility]  [place], [place] [address]  Fax: [phone]

## 2010-06-02 NOTE — Op Note (Signed)
[name], [name]                         ACCOUNT NO.:  [id number]   MEDICAL RECORD NO.:  [id number]                   PATIENT TYPE:  INP   LOCATION:  9160                                 FACILITY:  [facility]   PHYSICIAN:  Lesly Dukes, M.D.              DATE OF BIRTH:  August 21, 1980   DATE OF PROCEDURE:  01/14/2003  DATE OF DISCHARGE:                                 OPERATIVE REPORT   PREOPERATIVE DIAGNOSIS:  This is a 30 year old G1, P0, with a nonreassuring  fetal heart tracing, remote from delivery.   POSTOPERATIVE DIAGNOSES:  1. This is a 30 year old G1, P0, with a nonreassuring fetal heart tracing,     remote from delivery.  2. Tight nuchal cord.   PROCEDURE:  Primary low flap transverse cesarean section.   SURGEON:  Lesly Dukes, M.D.   ASSISTANTMichele Mcalpine D. Okey Dupre, M.D.   ESTIMATED BLOOD LOSS:  800.   COMPLICATIONS:  None.   PATHOLOGY:  Placenta.   FINDINGS:  A viable female infant, Apgars 8 at one and 8 at five.  Clear  fluid.  Vertex presentation.  Pediatrics at delivery.  Normal uterus,  fallopian tubes, and ovaries.   DESCRIPTION OF PROCEDURE:  After informed consent was obtained, the patient  was taken to the operating room, where epidural anesthesia was found to be  adequate.  The patient had a Foley already in place, and the patient prepare  and draped in a sterile fashion.  A Pfannenstiel skin incision was made with  the scalpel and carried down to the underlying layer of fascia with the  Bovie.  The Bovie was used to incise the fascia in the midline.  This  incision was extended bilaterally.  The superior and inferior aspect of the  fascial incision were grasped with Kocher clamps and tented up and dissected  off both sharply and bluntly from the underlying layers of rectus muscle.  The rectus muscles were separated in the midline, and the peritoneum was  identified and entered bluntly.  This incision was extended with good  visualization of the bladder.   The bladder blade was inserted and the  bladder flap was created using the Metzenbaum scissors, and the bladder  blade was reinserted.  The uterine incision was incised in a transverse  fashion in the lower uterine segment.  This incision was extended  bilaterally bluntly.  The head was delivered without difficulty and the nose  and mouth were suctioned.  The rest of the body of the baby delivered  easily, and the cord was clamped and cut and handed off to the awaiting  pediatrician.  The cord blood was sent for type and screen and a cord gas  was also sent.  The placenta delivered spontaneously with three-vessel cord  and the uterus was cleared of all clots and debris.  The uterine incision  was closed with 0 Vicryl in a running locked  fashion.  Excellent hemostasis  was noted.  The gutters were cleared of all clots and debris and the uterine  incision was noted to be hemostatic one last time.  The fascia was then  closed with 0 Vicryl in a running fashion and good hemostasis  was noted.  The subcutaneous tissue was copiously irrigated and found to be  hemostatic, and the skin was closed with staples.  The patient tolerated the  procedure well.  The sponge, lap, instrument, and needle counts were correct  x2.  The patient went to the recovery room in stable condition.                                               Lesly Dukes, M.D.    Lora Paula  D:  01/14/2003  T:  01/14/2003  Job:  [id number]

## 2010-06-02 NOTE — Discharge Summary (Signed)
NAMESAMIHA, DENAPOLI NO.:  [id number]   MEDICAL RECORD NO.:  [id number]          PATIENT TYPE:  INP   LOCATION:  5004                         FACILITY:  [facility]   PHYSICIAN:  Ace Gins, MD     DATE OF BIRTH:  [birth year]   DATE OF ADMISSION:  01/28/2004  DATE OF DISCHARGE:  01/30/2004                                 DISCHARGE SUMMARY   PRIMARY CARE PHYSICIAN:  Dr. Douglass Rivers   DISCHARGE DIAGNOSES:  1.  Acute asthma exacerbation.  2.  Hypoxia.  3.  Gastroesophageal reflux disease.  4.  Obesity.  5.  Allergic rhinitis.   DISCHARGE INSTRUCTIONS:   MEDICATIONS:  1.  Prilosec OTC 20 mg daily 30 minutes before first meal.  Trial of three      to four weeks.  Question if it will improve asthma exacerbations and      reflux symptoms.  2.  Advair 500/50 one puff b.i.d.  3.  Singulair 10 mg p.o. daily.  4.  Zyrtec 10 mg p.o. daily.  5.  Albuterol two puffs q.4h. x2-3 days, then q.6h. until feeling well, and      then p.r.n.  Note, question whether this patient may need a nebulizer      machine at home for her extended numbers of exacerbations.  This was      discussed by patient and Dr. Ellen Henri.  She is to discuss it in her next      visit with Dr. Michae Kava.   FOLLOWUP:  As scheduled in two weeks with Dr. Michae Kava.   HOSPITAL COURSE:  In brief, Ms. Fano is a 30 year old African-American  female with history of severe asthma with multiple exacerbations as well as  allergic rhinitis, obesity, and reflux.  She was seen in clinic on January 28, 2004 with cough, wheezing, and an O2 saturation of 92% on room air.  She  was admitted to the hospital for two days of steroid injections and  breathing treatments with oxygen.  She improved and on discharge January 15  she was with an O2 saturation of 95-96% on room air, feeling well, much more  active.  Continued to have wheezes  diffusely in bilateral lungs but with her knowledge of her disease and  compliance the  team felt comfortable discharging her to home with close  follow-up.  There were no consults nor studies during hospitalization except  chest x-ray which showed no pneumonia.       JS/MEDQ  D:  01/31/2004  T:  01/31/2004  Job:  [id number]   cc:   Douglass Rivers, M.D.  [facility].  Family Prac[facility] Resident  [place], [place] [address]  Fax: [phone]

## 2010-06-02 NOTE — H&P (Signed)
NAMEJULIAUNA, STUEVE NO.:  [id number]   MEDICAL RECORD NO.:  [id number]          PATIENT TYPE:  INP   LOCATION:  5715                         FACILITY:  [facility]   PHYSICIAN:  Leighton Roach McDiarmid, M.D.DATE OF BIRTH:  January 12, 1981   DATE OF ADMISSION:  05/05/2004  DATE OF DISCHARGE:                                HISTORY & PHYSICAL   HISTORY OF PRESENT ILLNESS:  This is a 30 year old female with history of  asthma with multiple admissions and ED physicians for asthma exacerbation  who presents with a 2-D history of runny nose and unproductive cough. No  history of fever. On the morning of admission, she developed shortness of  breath, dyspnea. She had albuterol nebulizations twice at home. EMS was  called, and she was given IV Solu-Medrol and two more albuterol  nebulizations. She was then brought to the emergency room where she was  given Atrovent and albuterol nebulizers and Xopenex nebulizers followed by  continuous albuterol nebulizations.   PAST MEDICAL HISTORY:  1.  Asthma, moderate, persistent, multiple ED visits approximately four      times a year, and multiple admissions four times a day for asthma      exacerbations. She has also had multiple steroid courses but no history      of intubation.  2.  Allergic rhinitis.   MEDICATIONS:  1.  Advair Diskus 500 mg 2 puffs b.i.d.  2.  Proventil p.r.n.  3.  Singulair 10 mg p.o. daily.  4.  Zyrtec 10 mg p.o. daily.   ALLERGIES:  No known drug allergies.   FAMILY HISTORY:  Asthma, allergic rhinitis in father and sister. CAD in a  grandmother.   PERSONAL SOCIAL HISTORY:  The patient has one child. No smoking at home.  House has central air and electric heating with carpet. She does not smoke  or drink alcohol.   REVIEW OF SYSTEMS:  Twelve-system review negative except for shortness of  breath, dyspnea, runny nose, and unproductive cough.   PHYSICAL EXAMINATION:  VITAL SIGNS:  Blood pressure 148/76 becoming  122/67,  heart rate of 112 becoming 85, respiratory rate of 20 becoming 16,  temperature of 98.6, O2 saturation 99% on 2 liters of oxygen.  GENERAL:  The patient is awake, coherent, not speaking in full sentences, in  moderate respiratory distress.  HEENT:  Pupils equally round and reactive to light bilaterally. Anicteric  sclerae. Tympanic membranes with normal landmarks. Positive malar flare. No  tonsillar or pharyngeal congestion.  NECK:  No cervical lymphadenopathy. No thyromegaly.  CARDIOVASCULAR:  Adynamic precordium, tachycardic, regular rhythm, no  murmurs.  RESPIRATORY:  Increased work of breathing with prolonged expiratory phase.  There are note of retractions. _________________ air entry with diffuse  wheezes. No crackles.  ABDOMEN:  Normal bowel sounds, soft, no masses, no tenderness.  EXTREMITIES:  No cyanosis. No edema. Peripheral pulses 2+.   LABORATORY DATA:  Chest x-ray normal.   ASSESSMENT:  A 30 year old female admitted for asthma exacerbation.   1.  Asthma exacerbation. Will admit to telemetry bed. Will start prednisone      60  mg p.o. daily. Start Xopenex nebulizations every six hours and      albuterol nebulizers every two to four hours as necessary. Will check      peak flows, pre and post nebulizations, maintain oxygen by nasal cannula      to keep O2 saturations about 92%. Possible precipitating factor includes      a viral upper respiratory tract infection. The patient is compliant with      present asthma medications. Will need to modify asthma medications prior      to discharge so achieve better control.  2.  Fluids, electrolytes, and nutrition. IV fluids 75 cc per hour. Clear      liquids. Advance diet as tolerated.  3.  Social. Discussed with patient and family regarding patient's condition      and need for admission. Addressed her questions and concerns.      MC/MEDQ  D:  05/05/2004  T:  05/06/2004  Job:  [id number]   cc:   Douglass Rivers, M.D.   [facility].  Family Pr[facility]. Resident  [place], [place] [address]  Fax: [phone]

## 2010-06-02 NOTE — H&P (Signed)
[name], [name]               ACCOUNT NO.:  [id number]   MEDICAL RECORD NO.:  [id number]          PATIENT TYPE:  INP   LOCATION:  0103                         FACILITY:  [facility]   PHYSICIAN:  Isidor Holts, M.D.  DATE OF BIRTH:  [birth year]   DATE OF ADMISSION:  11/08/2004  DATE OF DISCHARGE:                                HISTORY & PHYSICAL   PRIMARY MEDICAL DOCTOR:  Unassigned.   CHIEF COMPLAINT:  Increased shortness of breath and wheeze.   HISTORY OF PRESENT ILLNESS:  This is a 30 year old female, known asthmatic.  She was doing quite well until November 07, 2004, when she went to the  [place] and there were several people there. The heating was turned up,  and people were smoking.  Her chest became tight last night and by this  a.m. she had become very short of breath and wheezy.  She utilized home  nebulizers without much improvement.  She, therefore, called EMS.  Denies  antecedent upper respiratory tract infection, had a dry cough, and also mild  retrosternal and back pain.  Denies fever.   PAST MEDICAL HISTORY:  1.  Moderate persistent bronchial asthma.  2.  Seasonal allergic rhinitis.  3.  GERD.   MEDICATIONS:  1.  Albuterol MDI/nebulizers.  2.  Advair Diskus (500/50) one puff b.i.d.  3.  Singulair 10 mg p.o. every day.  4.  Zyrtec 10 mg p.o. every day.  5.  Prilosec 20 mg p.o. every day.   ALLERGIES:  No known drug allergies.   REVIEW OF SYSTEMS:  As per HPI and chief complaint, otherwise negative.   FAMILY/SOCIAL HISTORY:  The patient is unemployed, single, lives with her  mother, has one son who has eczema.  She is a nonsmoker.  Drinks alcohol  only occasionally.  Denies drug abuse.  She has two sisters both of whom are  alive and well.  Both parents are alive and well.   PHYSICAL EXAMINATION:  VITAL SIGNS:  Temperature 97.9, pulse 98/min,  regular, respiratory rate 20, BP 131/72-mmHg, rechecked 145/70-mmHg.  Pulse  oximetry 100% on 4 liters of  oxygen.  GENERAL:  The patient appears comfortable at this time and not short of  breath at rest, after nebulizer treatment, in the emergency department.  She  is alert, oriented, able to talk in complete sentences.  HEENT:  No clinical pallor.  No jaundice.  No conjunctival injection.  Throat appears quite clear.  NECK:  Supple.  JVP not seen.  No palpable lymphadenopathy.  No palpable  goiter.  CHEST:  The patient has bilateral polyphonic expiratory wheeze.  No  crackles.  HEART:  Sounds 1 and 2 heard.  Normal regular.  No murmurs.  ABDOMEN:  Full, soft, and nontender.  There is no palpable organomegaly, no  palpable masses.  Normal bowel sounds.  LOWER EXTREMITIES:  No pitting edema.  Palpable peripheral pulses.  MUSCULOSKELETAL:  Unremarkable.  CENTRAL NERVOUS SYSTEM:  No focal neurologic deficit on gross examination.   INVESTIGATIONS:  Labs pending.   ASSESSMENT/PLAN:  1.  Noninfective exacerbation of bronchial asthma, in this patient with  a      history of known moderate persistent bronchial asthma.  We shall admit      the patient to the telemetry floor for close observation.  Treat with      oxygen supplementation, bronchodilator nebulizers, parenteral steroids,      H2RA, and antihistamines.  For completeness we will do a chest x-ray and      CBC.  As the patient is going to be on albuterol nebulizers it will also      be relevant to check electrolytes.  This has been arranged accordingly.   1.  History of gastroesophageal reflux disease.  This should be adequately      covered by H2 receptor antagonist.  We shall institute deep vein thrombosis prophylaxis with Lovenox.   Further management will depend on clinical course.      Isidor Holts, M.D.  Electronically Signed     CO/MEDQ  D:  11/08/2004  T:  11/08/2004  Job:  [id number]

## 2010-06-02 NOTE — Discharge Summary (Signed)
Cynthia, [name]               ACCOUNT NO.:  [id number]   MEDICAL RECORD NO.:  [id number]          PATIENT TYPE:  INP   LOCATION:  3707                         FACILITY:  [facility]   PHYSICIAN:  Billey Gosling, M.D.    DATE OF BIRTH:  Jun 10, 1980   DATE OF ADMISSION:  12/25/2003  DATE OF DISCHARGE:  12/26/2003                                 DISCHARGE SUMMARY   PRIMARY CARE PHYSICIAN:  Douglass Rivers, M.D.   DISCHARGE DIAGNOSES:  1.  Asthma exacerbation.  2.  Hypoxia.  3.  Urinary tract infection.  4.  Allergic rhinitis.   DISCHARGE MEDICATIONS:  1.  Zyrtec 10 mg daily.  2.  Singulair 2 mg daily.  3.  Advair 500/50 one puff b.i.d.  4.  Albuterol 2 puffs every 4 to 6 hours p.r.n. wheeze.  5.  Prednisone 20 mg 3 tablets daily for 3 days.  6.  Bactrim DS 1 tablet b.i.d. for 2 days.   FOLLOW UP:  The patient stable upon discharge and will follow up tomorrow,  December 12, with her primary, Dr. Michae Kava, at the family practice center.   HOSPITAL COURSE:  #1.  ASTHMA EXACERBATION:  The patient is a 30 year old African-American  female with a history of asthma and recent admissions for asthma  exacerbations in August 2005 who presented with symptoms of upper  respiratory infection and was given albuterol nebulizers and  methylprednisolone in the emergency department, but her oxygen saturations  were in the low 90s and she was tachypneic.  Therefore, she was admitted.  Initially the patient's peak flows were in the low 200s, and her normal is  450 to 500, she stated.  The patient did well and on day of discharge, she  reported a peak flow of 400 with minimal shortness of breath and wheezing.  On exam, had improved.  She will continue albuterol inhaler at home as well  as continue a 5-day course of prednisone.   #2.  URINARY TRACT INFECTION:  The patient had symptoms of urinary urgency  and frequency but no dysuria.  Therefore, urinalysis was obtained which  showed few leukocyte  esterase, no nitrite, and 7 to 10 white blood cells.  Culture grew out Proteus mirabilis; sensitivities were not back.  She was  treated with Bactrim DS twice a day for a total of three days.   #3.  ALLERGIC RHINITIS:  The patient continues Zyrtec 10 mg at home.   #4.  HEALTH MAINTENANCE:  The patient was ordered to receive influenza  vaccine prior to discharge, and hopefully she will get that.       AS/MEDQ  D:  12/26/2003  T:  12/27/2003  Job:  [id number]   cc:   Douglass Rivers, M.D.  [facility].  Family Prac. Resident  [place], [place] [address]  Fax: [phone]

## 2010-06-02 NOTE — Discharge Summary (Signed)
NAMECINCERE, DEPREY               ACCOUNT NO.:  [id number]   MEDICAL RECORD NO.:  [id number]          PATIENT TYPE:  INP   LOCATION:  5013                         FACILITY:  [facility]   PHYSICIAN:  Broadus John T. Pickard II, MDDATE OF BIRTH:  [birth year]   DATE OF ADMISSION:  07/13/2004  DATE OF DISCHARGE:  07/15/2004                                 DISCHARGE SUMMARY   PRIMARY CARE PHYSICIAN:  Douglass Rivers, M.D.   DISCHARGE DIAGNOSES:  1.  Asthma exacerbation.  2.  Acute bronchitis.  3.  Seasonal allergies.  4.  Elevated white blood cell count, leukocytosis thought secondary to      steroids versus bronchitis.  5.  Elevated fasting CBGs in the 120s to 150s thought to be secondary to      steroids.   PROCEDURES:  Chest x-ray on the day of admission shows pulmonary  hyperexpansion without a focal process.   LABORATORY DATA:  Blood gas on admission showed pH of 7.184 with a PCO2 of  69.9, PO2 of 111.  A repeat ABG at that time showed a pH of 7.368 with a  PCO2 of 36 and PO2 of 102, bicarb of 20.6, oxygen saturation of 97.7 on 40%  oxygen.  Urinalysis at the time of admission was negative.  Urine  microscopic at the time of admission was negative.  CBC on the day of  discharge shows a white count of 22.6, hemoglobin of 12.2, hematocrit of  36.2, platelet count of 357.  BMP on the day of admission shows a sodium of  137, potassium 3.7, chloride of 107, bicarb 25, BUN of 10, creatinine 0.7,  and glucose of 100.   HOSPITAL COURSE:  The patient is a 30 year old African-American female who  presented in status asthmaticus after having URI and exposure to a known  irritant (dog).  She was initially admitted to the ICU for her acute care  needs.   1.  ASTHMA.  The patient initially required BiPAP to maintain O2      saturations.  The patient was __________with a pH of 7.184 on admission      and a bicarb of 26.3.  She received Solu-Medrol as well as albuterol      nebulizers.  The patient  responded quickly and was changed to p.o.      steroids after day #1.  Her nebulizers were gradually weaned throughout      the hospital course and she was weaned to q.6h. albuterol nebulizers at      the day of discharge.  She was restarted on her home Advair prior to the      day of discharge which she tolerated without difficulty.  At the time of      discharge the patient is on  prednisone 60 mg p.o. daily to complete a      total 10 day course.  At the time of discharge the patient was      instructed to take her Zyrtec 10 mg p.o. daily as directed, Singulair 10      mg as directed daily, Advair 50/250 two  puffs b.i.d. as directed, as      well as albuterol nebulizers 2.5mg  inhaled q.6h., standing for two days,      and then p.r.n. thereafter.  She is also being discharged on doxycycline      to cover for possible bacterial bronchitis.  On the day of discharge the      patient's peak flow was 380.  Her personal best is 450.  The patient is      currently 84% of her full functional capacity.  She is talking in full      sentences and is in no respiratory distress.  She was requiring no      oxygen and is 96% on room air on the day of discharge.  There is no use      of accessory muscles.  She has no IV fluid requirements and is      tolerating p.o. without difficulty.   1.  BACTERIAL BRONCHITIS:  The patient was started on doxycycline and will      complete a 10-day course of doxycycline 100 mg p.o. b.i.d.  White count      was elevated on admission at 21.7, on the day of discharge it remains      elevated at 22.6.  This is likely secondary to either an infection      versus steroid response.  Currently the patient is afebrile and no      obvious evidence of an infection.  She is discharged home and instructed      to follow up closely with her primary care physician and return if any      symptoms of shortness of breath or work of breathing return.   1.  ALLERGIES:  The patient is  maintained on her home medicines of Singulair      and Zyrtec.  We will allow her primary physician to determine whether or      not she feels referral to allergy specialist is warranted, given the      severe nature of this exacerbation secondary to a known allergen.   1.  ELEVATED FASTING CBGs:  The patient's CBGs in the hospital ranged from      120-150 fasting in the morning.  This is likely secondary to steroid      response.  However, we would appreciate it if her primary care physician      would follow these as an outpatient once the patient is off steroids and      assess for possible signs of insulin resistance or early diabetes      mellitus.   DISCHARGE MEDICATIONS:  1.  Albuterol nebulizer 2.5 mg inhaled q.6h., scheduled for two days, and      then p.r.n. thereafter.  2.  Doxycycline 100 mg twice daily until out.  Complete a 10-day course.  3.  Prednisone 60 mg once daily until out.  Complete a 10-day course.  4.  Zyrtec 10 mg p.o. daily.  5.  Singulair 10 mg p.o. daily.   FOLLOW UP:  The patient is instructed to follow up as previously scheduled  with her primary care physician on July 14.  She is instructed to call [phone] and confirm that appointment.       WTP/MEDQ  D:  07/15/2004  T:  07/15/2004  Job:  [id number]   cc:   Redge Gainer [facility]

## 2010-06-21 ENCOUNTER — Ambulatory Visit: Payer: Medicaid Other | Admitting: Family Medicine

## 2010-06-22 ENCOUNTER — Ambulatory Visit (INDEPENDENT_AMBULATORY_CARE_PROVIDER_SITE_OTHER): Payer: Medicare Other | Admitting: Family Medicine

## 2010-06-22 ENCOUNTER — Encounter: Payer: Self-pay | Admitting: Family Medicine

## 2010-06-22 VITALS — BP 120/85 | HR 73 | Temp 98.1°F | Wt 236.0 lb

## 2010-06-22 DIAGNOSIS — J45909 Unspecified asthma, uncomplicated: Secondary | ICD-10-CM | POA: Insufficient documentation

## 2010-06-22 DIAGNOSIS — J45901 Unspecified asthma with (acute) exacerbation: Secondary | ICD-10-CM

## 2010-06-22 DIAGNOSIS — J309 Allergic rhinitis, unspecified: Secondary | ICD-10-CM

## 2010-06-22 MED ORDER — FLUTICASONE FUROATE 27.5 MCG/SPRAY NA SUSP: 2.0000 | Freq: Every day | NASAL | Status: DC

## 2010-06-22 MED ORDER — PREDNISONE 50 MG PO TABS: 50.0000 mg | ORAL_TABLET | Freq: Every day | ORAL | Status: DC

## 2010-06-22 NOTE — Assessment & Plan Note (Signed)
Mold/allergen induced. Will place on short course of prednisone. No current infectious red flags. Will follow up in 1-2 weeks if sxs not improved. Pt agreeable to plan.

## 2010-06-22 NOTE — Patient Instructions (Signed)
It was good to see you today  I am starting you on a course of prednisone for your asthma The presence of mold in your apartment is the likely source of your asthma flare Moving out of your apartment is likely the best option to avoid this trigger Follow up with the allergist about possible allergy shots Follow up with me in 1-2 weeks if your symptoms are not better Call with any questions,  God Bless, Doree Albee MD

## 2010-06-22 NOTE — Assessment & Plan Note (Signed)
Restarted on flonase given swollen turbinates.

## 2010-06-22 NOTE — Progress Notes (Signed)
  Subjective:    Patient ID: [name], female    DOB: 1980-05-27, 30 y.o.   MRN: [id number]  HPI + Increased WOB and wheezing over last 1-2 weeks per pt. Pt with noted significant mold burden in her apartment secondary to leaking roof that she found 1-2 weeks ago per pt. Has been using QVAR consistently.  Also on zyrtec and singulair. Has had to use albuterol 2-3 times daily for symptomatic relief. Has seen allergist/pulmonologist in the past with noted mold allergy on testing. Is planning to move in the next 1-2 weeks. Was previously offered allergy shots in the past but never followed up. Is non smoker currently. No active smoke exposure per pt. No fevers. + dry cough.    Review of Systems See HPI     Objective:   Physical Exam Gen: up in bed, NAD HEENT: NCAT, EOMI, + swollen turbinates bilaterally  CV: RRR, no rubs, gallops, murmurs PULM: + wheezes in apices, faint coarse breath sounds in bases    Assessment & Plan:  Asthma exacerbation: Mold/allergen induced. Will place on short course of prednisone. No current infectious red flags. Will follow up in 1-2 weeks if sxs not improved. Pt agreeable to plan.

## 2010-06-23 ENCOUNTER — Telehealth: Payer: Self-pay | Admitting: *Deleted

## 2010-06-23 NOTE — Telephone Encounter (Addendum)
PA required for veramyst. Form placed in MD box. 

## 2010-06-26 ENCOUNTER — Other Ambulatory Visit: Payer: Self-pay | Admitting: Family Medicine

## 2010-06-26 MED ORDER — FLUTICASONE PROPIONATE 50 MCG/ACT NA SUSP: 2.0000 | Freq: Every day | NASAL | Status: DC

## 2010-06-26 NOTE — Telephone Encounter (Signed)
Order for generic fluticasone placed in system.

## 2010-07-10 ENCOUNTER — Other Ambulatory Visit: Payer: Self-pay | Admitting: Family Medicine

## 2010-07-10 MED ORDER — ALBUTEROL SULFATE HFA 108 (90 BASE) MCG/ACT IN AERS: 2.0000 | INHALATION_SPRAY | Freq: Four times a day (QID) | RESPIRATORY_TRACT | Status: DC | PRN

## 2010-07-10 MED ORDER — BECLOMETHASONE DIPROPIONATE 80 MCG/ACT IN AERS: 2.0000 | INHALATION_SPRAY | RESPIRATORY_TRACT | Status: DC | PRN

## 2010-07-10 NOTE — Telephone Encounter (Signed)
Forward to [place] for review, this is a red team patient who's last two office visits were with you.

## 2010-07-10 NOTE — Telephone Encounter (Signed)
Waiting for refill on Inhaler and [facility] on [address] says the request has been sent.  She is out of her inhaler and needs a new prescription as soon as possible.

## 2010-09-13 ENCOUNTER — Encounter: Payer: Self-pay | Admitting: Family Medicine

## 2010-09-13 ENCOUNTER — Ambulatory Visit (INDEPENDENT_AMBULATORY_CARE_PROVIDER_SITE_OTHER): Payer: Medicaid Other | Admitting: Family Medicine

## 2010-09-13 DIAGNOSIS — J45901 Unspecified asthma with (acute) exacerbation: Secondary | ICD-10-CM

## 2010-09-13 DIAGNOSIS — R071 Chest pain on breathing: Secondary | ICD-10-CM

## 2010-09-13 DIAGNOSIS — J45909 Unspecified asthma, uncomplicated: Secondary | ICD-10-CM

## 2010-09-13 MED ORDER — IPRATROPIUM BROMIDE 0.02 % IN SOLN
0.5000 mg | Freq: Once | RESPIRATORY_TRACT | Status: AC
  Administered 2010-09-13: 0.5 mg via RESPIRATORY_TRACT

## 2010-09-13 MED ORDER — ALBUTEROL SULFATE (2.5 MG/3ML) 0.083% IN NEBU
2.5000 mg | INHALATION_SOLUTION | Freq: Once | RESPIRATORY_TRACT | Status: AC
  Administered 2010-09-13: 2.5 mg via RESPIRATORY_TRACT

## 2010-09-13 MED ORDER — PREDNISONE 50 MG PO TABS: 50.0000 mg | ORAL_TABLET | Freq: Every day | ORAL | Status: AC

## 2010-09-13 MED ORDER — IBUPROFEN 800 MG PO TABS: 800.0000 mg | ORAL_TABLET | Freq: Three times a day (TID) | ORAL | Status: AC | PRN

## 2010-09-13 MED ORDER — ALBUTEROL SULFATE HFA 108 (90 BASE) MCG/ACT IN AERS: 2.0000 | INHALATION_SPRAY | Freq: Four times a day (QID) | RESPIRATORY_TRACT | Status: DC | PRN

## 2010-09-13 NOTE — Assessment & Plan Note (Signed)
Albuterol/Atrovent nebulized treatment given in clinic. Exam s/p treatment revealed much improved aeration.  Still with some wheezing lung bases.   Treat with 5 day course of Prednisone, refilled Albuterol inhaler.  FU if no improvement in 1-2 weeks

## 2010-09-13 NOTE — Assessment & Plan Note (Signed)
Treat with Ibuprofen 800 mg.  Not cardiac chest pain.

## 2010-09-13 NOTE — Progress Notes (Signed)
  Subjective:    Patient ID: [name], female    DOB: 1980-09-28, 30 y.o.   MRN: [id number]  HPI 1.  Asthma exacerbation:  Present x 3 days.  Went to visit aunt who has several dogs, patient with known allergy to dogs.  Noticed "scratchy" throat on Sunday afternoon, worsening shortness of breath started Sunday as well.  Did nebulizer treatment which helped for several hours on Sunday.  Started coughing at night, some throughout day as well, worsens her shortness of breath.  Has been using nebulizer every 4 hours since Sunday.  Coughing worse at night.  No fevers, chills, runny nose.  Describe non-productive, dry cough.     Review of Systems ROS See HPI above for review of systems.       Objective:   Physical Exam Gen:  Alert, cooperative patient who appears stated age in no acute distress.  Vital signs reviewed. HEENT:  [place]/AT.  EOMI, PERRL.  MMM, tonsils non-erythematous, non-edematous.  External ears WNL, Bilateral TM's normal without retraction, redness or bulging.  Neck: No masses or thyromegaly or limitation in range of motion.  No cervical lymphadenopathy. Cardiac:  Regular rate and rhythm without murmur auscultated.  Good S1/S2. Lungs:  Prolonged expiration.  Diffuse wheezing in all lung fields Ext:  No clubbing/cyanosis/erythema.  No edema noted bilateral lower extremities.  Chest:  Reproducible chest tenderness, midsternal, on palpation         Assessment & Plan:

## 2010-10-12 LAB — POCT I-STAT, CHEM 8
Calcium, Ion: 1.06 — ABNORMAL LOW
Chloride: 108
Creatinine, Ser: 0.9
Glucose, Bld: 118 — ABNORMAL HIGH
HCT: 41

## 2010-10-12 LAB — DIFFERENTIAL
Basophils Absolute: 0.1
Basophils Relative: 0
Eosinophils Relative: 0
Monocytes Absolute: 0.2
Monocytes Relative: 1 — ABNORMAL LOW
Neutro Abs: 15.8 — ABNORMAL HIGH

## 2010-10-12 LAB — POCT I-STAT 3, ART BLOOD GAS (G3+)
Acid-base deficit: 3 — ABNORMAL HIGH
O2 Saturation: 94
TCO2: 22
pO2, Arterial: 68 — ABNORMAL LOW

## 2010-10-12 LAB — BASIC METABOLIC PANEL
CO2: 19
Chloride: 110
Glucose, Bld: 192 — ABNORMAL HIGH
Potassium: 3.6
Sodium: 138

## 2010-10-12 LAB — BLOOD GAS, ARTERIAL
Acid-base deficit: 6.4 — ABNORMAL HIGH
Bicarbonate: 17.6 — ABNORMAL LOW
Bicarbonate: 19.5 — ABNORMAL LOW
Drawn by: 252031
O2 Saturation: 97.5
O2 Saturation: 98.5
Patient temperature: 97.8
TCO2: 18.5
TCO2: 20.5
pO2, Arterial: 102 — ABNORMAL HIGH
pO2, Arterial: 94.9

## 2010-10-12 LAB — CBC
HCT: 37
HCT: 38.5
Hemoglobin: 12.2
Hemoglobin: 12.9
MCHC: 33.1
MCHC: 33.6
MCV: 82.3
RBC: 4.49
RBC: 4.71
RDW: 14.6
RDW: 15

## 2010-10-13 LAB — URINALYSIS, ROUTINE W REFLEX MICROSCOPIC
Bilirubin Urine: NEGATIVE
Glucose, UA: 250 — AB
Ketones, ur: NEGATIVE
Specific Gravity, Urine: 1.013
pH: 5.5

## 2010-10-13 LAB — DIFFERENTIAL
Basophils Absolute: 0
Eosinophils Relative: 3
Lymphocytes Relative: 11 — ABNORMAL LOW
Monocytes Absolute: 0.8
Monocytes Relative: 5
Neutro Abs: 12.5 — ABNORMAL HIGH

## 2010-10-13 LAB — CARDIAC PANEL(CRET KIN+CKTOT+MB+TROPI)
CK, MB: 5.6 — ABNORMAL HIGH
Relative Index: 3.6 — ABNORMAL HIGH
Relative Index: 3.8 — ABNORMAL HIGH
Troponin I: 0.01

## 2010-10-13 LAB — CBC
HCT: 39.6
Hemoglobin: 13.1
MCHC: 33
RBC: 4.75
RDW: 15

## 2010-10-13 LAB — POCT I-STAT, CHEM 8
Calcium, Ion: 1.2
Chloride: 105
Creatinine, Ser: 1
Glucose, Bld: 122 — ABNORMAL HIGH
HCT: 44
Potassium: 3.7

## 2010-10-13 LAB — URINE MICROSCOPIC-ADD ON

## 2010-10-13 LAB — D-DIMER, QUANTITATIVE: D-Dimer, Quant: 0.5 — ABNORMAL HIGH

## 2010-10-31 ENCOUNTER — Inpatient Hospital Stay (INDEPENDENT_AMBULATORY_CARE_PROVIDER_SITE_OTHER)
Admission: RE | Admit: 2010-10-31 | Discharge: 2010-10-31 | Disposition: A | Discharge status: Home / Self Care | Payer: Medicaid Other | Source: Ambulatory Visit | Attending: Emergency Medicine | Admitting: Emergency Medicine

## 2010-10-31 DIAGNOSIS — J45909 Unspecified asthma, uncomplicated: Secondary | ICD-10-CM

## 2010-11-01 ENCOUNTER — Ambulatory Visit: Payer: Medicaid Other | Admitting: Family Medicine

## 2010-11-03 ENCOUNTER — Ambulatory Visit: Payer: Medicaid Other | Admitting: Family Medicine

## 2010-11-08 ENCOUNTER — Ambulatory Visit: Payer: Medicaid Other | Admitting: Family Medicine

## 2010-11-09 ENCOUNTER — Ambulatory Visit (INDEPENDENT_AMBULATORY_CARE_PROVIDER_SITE_OTHER): Payer: Medicaid Other | Admitting: Family Medicine

## 2010-11-09 ENCOUNTER — Encounter: Payer: Self-pay | Admitting: Family Medicine

## 2010-11-09 VITALS — BP 115/76 | HR 81 | Temp 97.8°F | Ht 66.0 in | Wt 228.0 lb

## 2010-11-09 DIAGNOSIS — J45909 Unspecified asthma, uncomplicated: Secondary | ICD-10-CM

## 2010-11-09 MED ORDER — AEROCHAMBER MV MISC: Status: DC

## 2010-11-09 NOTE — Assessment & Plan Note (Signed)
Doing okay today. Recommend continuing all therapies. We'll add a spacer to make Qvar more effective. Plan to followup in 3 months or sooner as needed

## 2010-11-09 NOTE — Patient Instructions (Signed)
Thank you for coming in today. Keep taking all your asthma medications Continue QVAR 2 puffs twice a day.  Come on back in 3 months or so to check up your asthma.  Flu shot today.  Good luck and take care.

## 2010-11-09 NOTE — Progress Notes (Signed)
Ms. Pryer presents to clinic today to followup her asthma. She was seen in urgent care last week and given nebulizer treatments and prednisone. Present ran out a few days ago. She feels well. She denies significant wheezing currently. She's using albuterol Advair Zyrtec and Singulair all as directed. She does not have a spacer.  No fevers chills.  PMH reviewed.  ROS as above otherwise neg Medications reviewed.  Exam:  BP 115/76  Pulse 81  Temp(Src) 97.8 F (36.6 C) (Oral)  Ht 5\' 6"  (1.676 m)  Wt 228 lb (103.42 kg)  BMI 36.80 kg/m2  SpO2 97%  LMP 11/02/2010 Gen: Well NAD HEENT: EOMI,  MMM Lungs:  WOB, slight expiratory wheezing right lung fields. Heart: RRR no MRG

## 2010-12-05 ENCOUNTER — Other Ambulatory Visit (HOSPITAL_COMMUNITY)
Admission: RE | Admit: 2010-12-05 | Discharge: 2010-12-05 | Disposition: A | Discharge status: Home / Self Care | Payer: Medicare Other | Source: Ambulatory Visit | Attending: Obstetrics and Gynecology | Admitting: Obstetrics and Gynecology

## 2010-12-05 DIAGNOSIS — Z01419 Encounter for gynecological examination (general) (routine) without abnormal findings: Secondary | ICD-10-CM | POA: Insufficient documentation

## 2010-12-16 ENCOUNTER — Emergency Department (INDEPENDENT_AMBULATORY_CARE_PROVIDER_SITE_OTHER): Payer: Medicare Other

## 2010-12-16 ENCOUNTER — Observation Stay (HOSPITAL_BASED_OUTPATIENT_CLINIC_OR_DEPARTMENT_OTHER)
Admission: EM | Admit: 2010-12-16 | Discharge: 2010-12-18 | Disposition: A | Discharge status: Home / Self Care | Payer: Medicare Other | Source: Ambulatory Visit | Attending: Family Medicine | Admitting: Family Medicine

## 2010-12-16 ENCOUNTER — Encounter (HOSPITAL_BASED_OUTPATIENT_CLINIC_OR_DEPARTMENT_OTHER): Payer: Self-pay

## 2010-12-16 DIAGNOSIS — E876 Hypokalemia: Secondary | ICD-10-CM

## 2010-12-16 DIAGNOSIS — K219 Gastro-esophageal reflux disease without esophagitis: Secondary | ICD-10-CM | POA: Diagnosis present

## 2010-12-16 DIAGNOSIS — Z23 Encounter for immunization: Secondary | ICD-10-CM | POA: Insufficient documentation

## 2010-12-16 DIAGNOSIS — E669 Obesity, unspecified: Secondary | ICD-10-CM | POA: Insufficient documentation

## 2010-12-16 DIAGNOSIS — J45901 Unspecified asthma with (acute) exacerbation: Principal | ICD-10-CM | POA: Insufficient documentation

## 2010-12-16 DIAGNOSIS — J45909 Unspecified asthma, uncomplicated: Secondary | ICD-10-CM

## 2010-12-16 LAB — BASIC METABOLIC PANEL
Chloride: 105 mEq/L (ref 96–112)
GFR calc non Af Amer: >90 mL/min (ref >90)
Glucose, Bld: 119 mg/dL — ABNORMAL HIGH (ref 70–99)
Potassium: 3.2 mEq/L — ABNORMAL LOW (ref 3.5–5.1)
Sodium: 141 mEq/L (ref 135–145)

## 2010-12-16 LAB — CBC
HCT: 38.1 % (ref 36.0–46.0)
Hemoglobin: 12.5 g/dL (ref 12.0–15.0)
MCHC: 32.8 g/dL (ref 30.0–36.0)
RBC: 4.73 MIL/uL (ref 3.87–5.11)
WBC: 10.4 10*3/uL (ref 4.0–10.5)

## 2010-12-16 MED ORDER — ALBUTEROL (5 MG/ML) CONTINUOUS INHALATION SOLN
10.0000 mg/h | INHALATION_SOLUTION | RESPIRATORY_TRACT | Status: AC
  Administered 2010-12-16: 10 mg/h via RESPIRATORY_TRACT
  Filled 2010-12-16: qty 20

## 2010-12-16 MED ORDER — HYDROCODONE-ACETAMINOPHEN 5-325 MG PO TABS: 1.0000 | ORAL_TABLET | Freq: Once | ORAL | Status: DC

## 2010-12-16 MED ORDER — IPRATROPIUM BROMIDE 0.02 % IN SOLN
RESPIRATORY_TRACT | Status: AC
  Administered 2010-12-16: 0.5 mg via RESPIRATORY_TRACT
  Filled 2010-12-16: qty 2.5

## 2010-12-16 MED ORDER — ALBUTEROL SULFATE (5 MG/ML) 0.5% IN NEBU
5.0000 mg | INHALATION_SOLUTION | Freq: Once | RESPIRATORY_TRACT | Status: AC
  Administered 2010-12-16: 5 mg via RESPIRATORY_TRACT

## 2010-12-16 MED ORDER — METHYLPREDNISOLONE SODIUM SUCC 125 MG IJ SOLR
125.0000 mg | Freq: Once | INTRAMUSCULAR | Status: AC
  Administered 2010-12-16: 125 mg via INTRAVENOUS
  Filled 2010-12-16: qty 2

## 2010-12-16 MED ORDER — HYDROCODONE-ACETAMINOPHEN 5-325 MG PO TABS
1.0000 | ORAL_TABLET | Freq: Once | ORAL | Status: AC
  Administered 2010-12-16: 1 via ORAL
  Filled 2010-12-16: qty 1

## 2010-12-16 MED ORDER — ALBUTEROL SULFATE (5 MG/ML) 0.5% IN NEBU
INHALATION_SOLUTION | RESPIRATORY_TRACT | Status: AC
  Administered 2010-12-16: 5 mg via RESPIRATORY_TRACT
  Filled 2010-12-16: qty 1

## 2010-12-16 MED ORDER — IPRATROPIUM BROMIDE 0.02 % IN SOLN
0.5000 mg | Freq: Once | RESPIRATORY_TRACT | Status: AC
  Administered 2010-12-16: 0.5 mg via RESPIRATORY_TRACT

## 2010-12-16 MED ORDER — ALBUTEROL SULFATE (5 MG/ML) 0.5% IN NEBU
5.0000 mg | INHALATION_SOLUTION | Freq: Once | RESPIRATORY_TRACT | Status: AC
  Administered 2010-12-16: 5 mg via RESPIRATORY_TRACT
  Filled 2010-12-16: qty 1

## 2010-12-16 MED ORDER — ALBUTEROL SULFATE (5 MG/ML) 0.5% IN NEBU
INHALATION_SOLUTION | RESPIRATORY_TRACT | Status: AC
  Filled 2010-12-16: qty 0.5

## 2010-12-16 NOTE — H&P (Signed)
[name] is an 30 y.o. female.   Chief Complaint: Cough and wheezing HPI: 30 year old female with a known history of asthma presents as a direct admit from [facility] ED with shortness of breath since Saturday morning. Patient with acute onset of nonproductive cough and wheezing on exposure to a new bug spray. She tried using her albuterol, Flonase, Singulair, Zyrtec. She reports that her  albuterol inhaler did not improve her wheezing and shortness of breath, so she went to the ED.  She's been out of her Qvar for the past 2 days. She denies associated fever, runny nose, and sore throat. She also denied sick contacts. She admits to nonradiating chest pain with coughing. She also admits to right shoulder and arm pain with coughing. She admits to not receiving her flu vaccine this season.   Past Medical History  Diagnosis Date  . Asthma, presents of exacerbations up to 3 times a year requiring ED visits or hospitalizations. He did out of 5 she is admitted to ICU and intubated. She has not been intubated during the observation period.    . Acid reflux     Past Surgical History  Procedure Date  . Cesarean section     Family History  Problem Relation Age of Onset  . Hypertension Mother   . Asthma Father   . Diabetes Father    Social History:  reports that she has never smoked. She has never used smokeless tobacco. She reports that she drinks about .5 ounces of alcohol per week. She reports that she does not use illicit drugs.  Allergies:  Allergies  Allergen Reactions  . Iohexol Hives       . Moxifloxacin Other (See Comments)    unknown    Medications Prior to Admission  Medication Dose Route Frequency Provider Last Rate Last Dose  . albuterol (PROVENTIL) (5 MG/ML) 0.5% nebulizer solution 5 mg  5 mg Nebulization Once Dione Booze, MD   5 mg at 12/16/10 1853  . albuterol (PROVENTIL,VENTOLIN) solution continuous neb  10 mg/hr Nebulization Continuous Teressa Lower, NP 2 mL/hr at  12/16/10 1921 10 mg/hr at 12/16/10 1921  . ipratropium (ATROVENT) nebulizer solution 0.5 mg  0.5 mg Nebulization Once Dione Booze, MD   0.5 mg at 12/16/10 1854  . methylPREDNISolone sodium succinate (SOLU-MEDROL) 125 MG injection 125 mg  125 mg Intravenous Once Teressa Lower, NP   125 mg at 12/16/10 2008   Medications Prior to Admission  Medication Sig Dispense Refill  . albuterol (PROVENTIL HFA;VENTOLIN HFA) 108 (90 BASE) MCG/ACT inhaler Inhale 2 puffs into the lungs every 4 (four) hours as needed. For shortness of breath or wheezing       . beclomethasone (QVAR) 80 MCG/ACT inhaler Inhale 2 puffs into the lungs 2 (two) times daily.        . cetirizine (ZYRTEC ALLERGY) 10 MG tablet Take 1 tablet (10 mg total) by mouth daily.  30 tablet  11  . montelukast (SINGULAIR) 10 MG tablet Take 1 tablet (10 mg total) by mouth at bedtime.  30 tablet  2    Results for orders placed during the hospital encounter of 12/16/10 (from the past 48 hour(s))  CBC     Status: Normal   Collection Time   12/16/10  8:37 PM      Component Value Range Comment   WBC 10.4  4.0 - 10.5 (K/uL)    RBC 4.73  3.87 - 5.11 (MIL/uL)    Hemoglobin 12.5  12.0 - 15.0 (  g/dL)    HCT 16.1  09.6 - 04.5 (%)    MCV 80.5  78.0 - 100.0 (fL)    MCH 26.4  26.0 - 34.0 (pg)    MCHC 32.8  30.0 - 36.0 (g/dL)    RDW 40.9  81.1 - 91.4 (%)    Platelets 361  150 - 400 (K/uL)   BASIC METABOLIC PANEL     Status: Abnormal   Collection Time   12/16/10  8:37 PM      Component Value Range Comment   Sodium 141  135 - 145 (mEq/L)    Potassium 3.2 (*) 3.5 - 5.1 (mEq/L)    Chloride 105  96 - 112 (mEq/L)    CO2 25  19 - 32 (mEq/L)    Glucose, Bld 119 (*) 70 - 99 (mg/dL)    BUN 10  6 - 23 (mg/dL)    Creatinine, Ser 7.82  0.50 - 1.10 (mg/dL)    Calcium 9.2  8.4 - 10.5 (mg/dL)    GFR calc non Af Amer >90  >90 (mL/min)    GFR calc Af Amer >90  >90 (mL/min)     CXR 12/16/10:  The lungs are well-aerated.  Mild peribronchial thickening may reflect the  patient's history of asthma.  There is no evidence of focal opacification, pleural effusion or pneumothorax.    ROS Pertinent review of systems as per history of present illness.  GI: Patient denies nausea vomiting diarrhea. GU: patient denies dysuria Muscle skeletal: Patient denies body aches and pains other than right shoulder and right arm.  Blood pressure 124/66, pulse 106, temperature 97.5 F (36.4 C), temperature source Oral, resp. rate 18, height 5\' 6"  (1.676 m), weight 220 lb (99.791 kg), last menstrual period 12/08/2010, SpO2 96.00%. Physical Exam  BP 134/81  Pulse 112  Temp(Src) 98.2 F (36.8 C) (Oral)  Resp 20  Ht 5' 7.2" (1.707 m)  Wt 231 lb 4.2 oz (104.9 kg)  BMI 36.01 kg/m2  SpO2 94%  LMP 11/30/2010 General appearance: alert, cooperative and no distress Head: Normocephalic, without obvious abnormality, atraumatic Throat: lips, mucosa, and tongue normal; teeth and gums normal Neck: no adenopathy, supple, symmetrical, trachea midline and thyroid not enlarged, symmetric, no tenderness/mass/nodules Lungs: mild retractions and wheezes diffuseduring expiration and inspiration. BS even but limited at bilateral bases.  Heart: tachy rate, regular rhythm, S1, S2 normal, no murmur, click, rub or gallop Abdomen: soft, non-tender; bowel sounds normal; no masses,  no organomegaly Extremities: extremities normal, atraumatic, no cyanosis or edema Pulses: 2+ and symmetric Skin: Skin color, texture, turgor normal. No rashes or lesions Neuro: CN II-XII wnl, grossly intact.    Assessment/Plan 30 yo female with asthma and GERD presents with acute asthma exacerbation most likely secondary to aerosol exposure.  1. Asthma exacerbation:  A: Moderate asthma exacerbation requiring every 2- 4 -hour albuterol nebulizer treatments. Patient with mild increased work of breathing. Patient is not hypoxic. P:  -Admit for observation. Will monitor on telemetry.  -Continue albuterol and Atrovent  nebulizer treatments every 4 hours scheduled every 2 when necessary. -Plan to continue steroids for 5 day course. -Repeat chest x-ray in a.m. to rule out pulmonary infiltrate. -Administer flu vaccine.   2. Tachycardia.  Secondary to albuterol we'll monitor with telemetry.  3. Hypokalemia. Secondary to albuterol. - add potassium to IV fluids -Recheck potassium in a.m.  4. DVT prophylaxis: Heparin 5000 units subcutaneous 3 times a day  5. GI prophylaxis: Protonix.  6. Disposition: To home any clinical  improvement. Anticipate short hospital stay.   Lalaine Overstreet 12/16/2010, 10:36 PM

## 2010-12-16 NOTE — ED Notes (Signed)
Pt c/o asthma attach which started approximately yesterday.

## 2010-12-16 NOTE — ED Provider Notes (Signed)
Evaluation and management procedures were performed by the PA/NP under my supervision/collaboration.   Dione Booze, MD 12/16/10 [phone]

## 2010-12-16 NOTE — ED Provider Notes (Signed)
History     CSN: [id number] Arrival date & time: 12/16/2010  7:09 PM   First MD Initiated Contact with Patient 12/16/10 1953      Chief Complaint  Patient presents with  . Shortness of Breath    (Consider location/radiation/quality/duration/timing/severity/associated sxs/prior treatment) HPI Comments: Pt state that her inhaler is not working at this time  Patient is a 30 y.o. female presenting with shortness of breath. The history is provided by the patient. No language interpreter was used.  Shortness of Breath  The current episode started yesterday. The onset was sudden. The problem occurs continuously. The problem has been gradually worsening. The problem is moderate. The symptoms are relieved by nothing. The symptoms are aggravated by nothing. Associated symptoms include cough, shortness of breath and wheezing. Pertinent negatives include no fever and no sore throat. She has not inhaled smoke recently. She has had prior hospitalizations. She has had prior ICU admissions. She has had no prior intubations. Her past medical history is significant for asthma.    Past Medical History  Diagnosis Date  . Asthma   . Acid reflux     Past Surgical History  Procedure Date  . Cesarean section     History reviewed. No pertinent family history.  History  Substance Use Topics  . Smoking status: Never Smoker   . Smokeless tobacco: Never Used  . Alcohol Use: Yes     occasionaly    OB History    Grav Para Term Preterm Abortions TAB SAB Ect Mult Living                  Review of Systems  Constitutional: Negative for fever.  HENT: Negative for sore throat.   Respiratory: Positive for cough, shortness of breath and wheezing.     Allergies  Iohexol and Moxifloxacin  Home Medications   Current Outpatient Rx  Name Route Sig Dispense Refill  . ALBUTEROL SULFATE HFA 108 (90 BASE) MCG/ACT IN AERS Inhalation Inhale 2 puffs into the lungs every 4 (four) hours as needed. For  shortness of breath or wheezing     . ALBUTEROL SULFATE (2.5 MG/3ML) 0.083% IN NEBU Nebulization Take 2.5 mg by nebulization every 4 (four) hours as needed. For shortness of breath or wheezing     . BECLOMETHASONE DIPROPIONATE 80 MCG/ACT IN AERS Inhalation Inhale 2 puffs into the lungs 2 (two) times daily.      Marland Kitchen CETIRIZINE HCL 10 MG PO TABS Oral Take 1 tablet (10 mg total) by mouth daily. 30 tablet 11  . IBUPROFEN 200 MG PO TABS Oral Take 200 mg by mouth every 6 (six) hours as needed. For pain     . MONTELUKAST SODIUM 10 MG PO TABS Oral Take 1 tablet (10 mg total) by mouth at bedtime. 30 tablet 2  . OMEPRAZOLE MAGNESIUM 20 MG PO TBEC Oral Take 20 mg by mouth daily as needed. For reflux        BP 124/66  Pulse 106  Temp(Src) 97.5 F (36.4 C) (Oral)  Resp 18  Ht 5\' 6"  (1.676 m)  Wt 220 lb (99.791 kg)  BMI 35.51 kg/m2  SpO2 96%  LMP 12/08/2010  Physical Exam  Nursing note and vitals reviewed. Constitutional: She is oriented to person, place, and time. She appears well-developed and well-nourished.  HENT:  Head: Normocephalic and atraumatic.  Right Ear: External ear normal.  Left Ear: External ear normal.  Neck: Normal range of motion. Neck supple.  Cardiovascular: Normal rate.  Pulmonary/Chest: She is in respiratory distress. She has wheezes.       Pt using accessory muscle  Musculoskeletal: Normal range of motion.  Neurological: She is alert and oriented to person, place, and time.  Skin: Skin is warm and dry.  Psychiatric: She has a normal mood and affect.    ED Course  Procedures (including critical care time)  Labs Reviewed  BASIC METABOLIC PANEL - Abnormal; Notable for the following:    Potassium 3.2 (*)    Glucose, Bld 119 (*)    All other components within normal limits  CBC   Dg Chest 2 View  12/16/2010  *RADIOLOGY REPORT*  Clinical Data: Wheezing; history of asthma.  CHEST - 2 VIEW  Comparison: Chest radiograph performed 03/24/2010  Findings: The lungs are  well-aerated.  Mild peribronchial thickening may reflect the patient's history of asthma.  There is no evidence of focal opacification, pleural effusion or pneumothorax.  The heart is normal in size; the mediastinal contour is within normal limits.  No acute osseous abnormalities are seen.  IMPRESSION: Mild peribronchial thickening may reflect the patient's history of asthma; no evidence of focal consolidation.  Original Report Authenticated By: Tonia Ghent, M.D.     1. Asthma exacerbation       MDM  Pt continuing to have symptoms and think that pt needs to admitted        Teressa Lower, NP 12/16/10 2312

## 2010-12-16 NOTE — ED Notes (Signed)
Pt has had 3 tx HHN (one A/A HHN, one CAT HHN and an Albuterol HHN tx); pt is still wheezing and states that she is in pain.

## 2010-12-17 ENCOUNTER — Observation Stay (HOSPITAL_COMMUNITY): Payer: Medicare Other

## 2010-12-17 ENCOUNTER — Encounter (HOSPITAL_COMMUNITY): Payer: Self-pay | Admitting: *Deleted

## 2010-12-17 LAB — CBC
HCT: 36.3 % (ref 36.0–46.0)
HCT: 36.3 % (ref 36.0–46.0)
MCHC: 32.8 g/dL (ref 30.0–36.0)
MCHC: 32.8 g/dL (ref 30.0–36.0)
MCV: 81.4 fL (ref 78.0–100.0)
MCV: 81.6 fL (ref 78.0–100.0)
Platelets: 351 10*3/uL (ref 150–400)
Platelets: 361 10*3/uL (ref 150–400)
RDW: 14.2 % (ref 11.5–15.5)
RDW: 14.2 % (ref 11.5–15.5)
WBC: 14.1 10*3/uL — ABNORMAL HIGH (ref 4.0–10.5)

## 2010-12-17 LAB — BASIC METABOLIC PANEL
BUN: 8 mg/dL (ref 6–23)
CO2: 21 mEq/L (ref 19–32)
Calcium: 9.1 mg/dL (ref 8.4–10.5)
Chloride: 103 mEq/L (ref 96–112)
Creatinine, Ser: 0.52 mg/dL (ref 0.50–1.10)

## 2010-12-17 LAB — CREATININE, SERUM: GFR calc non Af Amer: >90 mL/min (ref >90)

## 2010-12-17 MED ORDER — POTASSIUM CHLORIDE IN NACL 20-0.45 MEQ/L-% IV SOLN
INTRAVENOUS | Status: DC
  Administered 2010-12-17 (×3): via INTRAVENOUS
  Filled 2010-12-17 (×3): qty 1000

## 2010-12-17 MED ORDER — IPRATROPIUM BROMIDE 0.02 % IN SOLN: 0.5000 mg | RESPIRATORY_TRACT | Status: DC

## 2010-12-17 MED ORDER — PANTOPRAZOLE SODIUM 40 MG PO TBEC
40.0000 mg | DELAYED_RELEASE_TABLET | Freq: Every day | ORAL | Status: DC
  Administered 2010-12-17 – 2010-12-18 (×2): 40 mg via ORAL
  Filled 2010-12-17 (×2): qty 1

## 2010-12-17 MED ORDER — METHYLPREDNISOLONE SODIUM SUCC 125 MG IJ SOLR
125.0000 mg | Freq: Every day | INTRAMUSCULAR | Status: DC
  Administered 2010-12-17: 125 mg via INTRAVENOUS
  Filled 2010-12-17: qty 2

## 2010-12-17 MED ORDER — ALBUTEROL SULFATE (5 MG/ML) 0.5% IN NEBU: 2.5000 mg | INHALATION_SOLUTION | RESPIRATORY_TRACT | Status: DC | PRN

## 2010-12-17 MED ORDER — ONDANSETRON HCL 4 MG PO TABS: 4.0000 mg | ORAL_TABLET | Freq: Four times a day (QID) | ORAL | Status: DC | PRN

## 2010-12-17 MED ORDER — IPRATROPIUM BROMIDE 0.02 % IN SOLN
0.5000 mg | RESPIRATORY_TRACT | Status: DC
  Administered 2010-12-17 (×2): 0.5 mg via RESPIRATORY_TRACT
  Filled 2010-12-17 (×2): qty 2.5

## 2010-12-17 MED ORDER — MORPHINE SULFATE 2 MG/ML IJ SOLN
1.0000 mg | INTRAMUSCULAR | Status: DC | PRN
  Administered 2010-12-17 (×2): 1 mg via INTRAVENOUS
  Filled 2010-12-17 (×2): qty 1

## 2010-12-17 MED ORDER — ACETAMINOPHEN 325 MG PO TABS: 650.0000 mg | ORAL_TABLET | Freq: Four times a day (QID) | ORAL | Status: DC | PRN

## 2010-12-17 MED ORDER — INFLUENZA VIRUS VACC SPLIT PF IM SUSP
0.5000 mL | INTRAMUSCULAR | Status: AC
  Administered 2010-12-18: 0.5 mL via INTRAMUSCULAR
  Filled 2010-12-17: qty 0.5

## 2010-12-17 MED ORDER — IPRATROPIUM BROMIDE 0.02 % IN SOLN: 0.5000 mg | RESPIRATORY_TRACT | Status: DC | PRN

## 2010-12-17 MED ORDER — PREDNISONE 50 MG PO TABS
50.0000 mg | ORAL_TABLET | Freq: Every day | ORAL | Status: DC
  Administered 2010-12-17 – 2010-12-18 (×2): 50 mg via ORAL
  Filled 2010-12-17 (×3): qty 1

## 2010-12-17 MED ORDER — POTASSIUM CHLORIDE CRYS ER 20 MEQ PO TBCR
40.0000 meq | EXTENDED_RELEASE_TABLET | ORAL | Status: AC
  Administered 2010-12-17 (×2): 40 meq via ORAL
  Filled 2010-12-17 (×2): qty 2

## 2010-12-17 MED ORDER — ONDANSETRON HCL 4 MG/2ML IJ SOLN: 4.0000 mg | Freq: Four times a day (QID) | INTRAMUSCULAR | Status: DC | PRN

## 2010-12-17 MED ORDER — DOCUSATE SODIUM 100 MG PO CAPS
100.0000 mg | ORAL_CAPSULE | Freq: Two times a day (BID) | ORAL | Status: DC
  Administered 2010-12-17 – 2010-12-18 (×3): 100 mg via ORAL
  Filled 2010-12-17 (×4): qty 1

## 2010-12-17 MED ORDER — ALBUTEROL SULFATE (5 MG/ML) 0.5% IN NEBU: 2.5000 mg | INHALATION_SOLUTION | RESPIRATORY_TRACT | Status: DC

## 2010-12-17 MED ORDER — ALBUTEROL SULFATE (5 MG/ML) 0.5% IN NEBU
2.5000 mg | INHALATION_SOLUTION | RESPIRATORY_TRACT | Status: DC
  Administered 2010-12-17 – 2010-12-18 (×9): 2.5 mg via RESPIRATORY_TRACT
  Filled 2010-12-17 (×9): qty 0.5

## 2010-12-17 MED ORDER — HYDROCODONE-ACETAMINOPHEN 5-325 MG PO TABS
1.0000 | ORAL_TABLET | ORAL | Status: DC | PRN
  Administered 2010-12-17: 1 via ORAL
  Administered 2010-12-17: 2 via ORAL
  Administered 2010-12-17: 1 via ORAL
  Filled 2010-12-17: qty 1
  Filled 2010-12-17: qty 2
  Filled 2010-12-17: qty 1

## 2010-12-17 MED ORDER — SENNA 8.6 MG PO TABS
1.0000 | ORAL_TABLET | Freq: Two times a day (BID) | ORAL | Status: DC
  Administered 2010-12-17 – 2010-12-18 (×3): 8.6 mg via ORAL
  Filled 2010-12-17 (×4): qty 1

## 2010-12-17 MED ORDER — ACETAMINOPHEN 650 MG RE SUPP: 650.0000 mg | Freq: Four times a day (QID) | RECTAL | Status: DC | PRN

## 2010-12-17 MED ORDER — HEPARIN SODIUM (PORCINE) 5000 UNIT/ML IJ SOLN
5000.0000 [IU] | Freq: Three times a day (TID) | INTRAMUSCULAR | Status: DC
  Administered 2010-12-17 – 2010-12-18 (×3): 5000 [IU] via SUBCUTANEOUS
  Filled 2010-12-17 (×6): qty 1

## 2010-12-17 NOTE — Plan of Care (Signed)
Problem: ICU Phase Progression Outcomes Goal: Initial discharge plan identified Outcome: Not Applicable Date Met:  12/17/10 Pt to return home when medically cleared

## 2010-12-17 NOTE — H&P (Signed)
FMTS Attending Daily Note: Damani Kelemen MD [phone] pager office [phone] I  have seen and examined this patient, reviewed their chart. I have discussed this patient with the resident. I agree with the resident's findings, assessment and care plan. 

## 2010-12-17 NOTE — Plan of Care (Signed)
Problem: Phase II Progression Outcomes Goal: Activity at appropriate level-compared to baseline (UP IN CHAIR FOR HEMODIALYSIS)  Outcome: Completed/Met Date Met:  12/17/10 Pt ambulating indepently

## 2010-12-17 NOTE — Progress Notes (Signed)
MD please address VTE prophylaxis. Thanks.  Yulian Gosney, Joan Mayans, RN

## 2010-12-18 MED ORDER — PREDNISONE 50 MG PO TABS: 50.0000 mg | ORAL_TABLET | Freq: Every day | ORAL | Status: AC

## 2010-12-18 MED ORDER — BECLOMETHASONE DIPROPIONATE 80 MCG/ACT IN AERS: 2.0000 | INHALATION_SPRAY | Freq: Two times a day (BID) | RESPIRATORY_TRACT | Status: DC

## 2010-12-18 MED ORDER — BECLOMETHASONE DIPROPIONATE 80 MCG/ACT IN AERS
2.0000 | INHALATION_SPRAY | Freq: Two times a day (BID) | RESPIRATORY_TRACT | Status: DC
  Administered 2010-12-18: 2 via RESPIRATORY_TRACT
  Filled 2010-12-18: qty 8.7

## 2010-12-18 MED ORDER — ALBUTEROL SULFATE HFA 108 (90 BASE) MCG/ACT IN AERS: 2.0000 | INHALATION_SPRAY | Freq: Four times a day (QID) | RESPIRATORY_TRACT | Status: DC

## 2010-12-18 MED ORDER — ALBUTEROL SULFATE HFA 108 (90 BASE) MCG/ACT IN AERS
2.0000 | INHALATION_SPRAY | Freq: Four times a day (QID) | RESPIRATORY_TRACT | Status: DC
  Administered 2010-12-18: 2 via RESPIRATORY_TRACT
  Filled 2010-12-18: qty 6.7

## 2010-12-18 NOTE — Progress Notes (Signed)
Nsg Discharge Note  Admit Date:  12/16/2010 Discharge date: 12/18/2010   Varney Biles Causer to be D/C'd Home per MD order.   All belongings sent with the patient and all questions fully answered.  Discharge Medication:  Rayvn, [place]  Home Medication Instructions [id number]   Printed on:12/18/10 1538  Medication Information                    cetirizine (ZYRTEC ALLERGY) 10 MG tablet Take 1 tablet (10 mg total) by mouth daily.           montelukast (SINGULAIR) 10 MG tablet Take 1 tablet (10 mg total) by mouth at bedtime.           beclomethasone (QVAR) 80 MCG/ACT inhaler Inhale 2 puffs into the lungs 2 (two) times daily.             omeprazole (PRILOSEC OTC) 20 MG tablet Take 20 mg by mouth daily as needed. For reflux             albuterol (PROVENTIL) (2.5 MG/3ML) 0.083% nebulizer solution Take 2.5 mg by nebulization every 4 (four) hours as needed. For shortness of breath or wheezing            ibuprofen (ADVIL,MOTRIN) 200 MG tablet Take 200 mg by mouth every 6 (six) hours as needed. For pain            albuterol (PROVENTIL HFA;VENTOLIN HFA) 108 (90 BASE) MCG/ACT inhaler Inhale 2 puffs into the lungs every 6 (six) hours. After 3 days, use every 4 hours as needed for shortness of breath or wheezing.           beclomethasone (QVAR) 80 MCG/ACT inhaler Inhale 2 puffs into the lungs 2 (two) times daily.           predniSONE (DELTASONE) 50 MG tablet Take 1 tablet (50 mg total) by mouth daily.             Discharge Assessment: Filed Vitals:   12/18/10 1407  BP: 128/76  Pulse: 78  Temp: 98.5 F (36.9 C)  Resp: 18   Skin clean, dry and intact without evidence of skin break down, no evidence of skin tears noted. IV catheter discontinued intact. Site without signs and symptoms of complications. Dressing and pressure applied.  D/c Instructions-Education: Discharge instructions given to patient/family with verbalized understanding. D/c education completed with patient/family  including follow up instructions, medication list, d/c activities limitations if indicated, with other d/c instructions as indicated by MD - patient able to verbalize understanding, all questions fully answered. Patient instructed to return to ED, call 911, or call MD for any changes in condition.  Patient escorted by tech, and D/C home via private auto with sister.   Gwendalyn Ege, RN 12/18/2010 3:38 PM

## 2010-12-18 NOTE — Discharge Summary (Signed)
Family Medicine Teaching Service Attending Note  I discussed patient [name]  with Dr. Williamson and reviewed their note for today.  I agree with their assessment and plan.      

## 2010-12-18 NOTE — Progress Notes (Signed)
Family Medicine Teaching Service Attending Note  I discussed patient [name]  with Dr. Clinton Sawyer and reviewed their note for today.  I agree with their assessment and plan.

## 2010-12-18 NOTE — Progress Notes (Signed)
Daily Progress Note Si Raider. Clinton Sawyer, M.D., M.B.A  Family Medicine PGY-1 Pager [phone]  Subjective: Patient states that she is comfortable and much improved from yesterday, she thinks that she is near her baseline respiratory status and denies SOB at this time  Objective: Vital signs in last 24 hours: Temp:  [97.7 F (36.5 C)-98.2 F (36.8 C)] 98 F (36.7 C) (12/03 0533) Pulse Rate:  [71-87] 71  (12/03 0533) Resp:  [17-19] 17  (12/03 0533) BP: (108-123)/(66-72) 108/72 mmHg (12/03 0533) SpO2:  [94 %-97 %] 97 % (12/03 0533) Weight change:  Last BM Date: 12/16/10  Intake/Output from previous day: 12/02 0701 - 12/03 0700 In: 1989.2 [P.O.:840; I.V.:1149.2] Out: 900 [Urine:900] Intake/Output this shift:   Gen: alert, oriented, pleasant, non-distressed  HEENT: NCAT Resp: no accessory muscle use, wheezes in all lungs fields, good air movement Cardiac: RRR, no murmurs Extremities:   Lab Results:  Basename 12/17/10 0700 12/16/10 2037  WBC 14.1*14.1* 10.4  HGB 11.9*11.9* 12.5  HCT 36.336.3 38.1  PLT [id number] 361   BMET  Basename 12/17/10 0700 12/16/10 2037  NA 135 141  K 4.2 3.2*  CL 103 105  CO2 21 25  GLUCOSE 137* 119*  BUN 8 10  CREATININE 0.510.52 0.60  CALCIUM 9.1 9.2    Studies/Results: X-ray Chest Pa And Lateral   12/17/2010  *RADIOLOGY REPORT*  Clinical Data: Shortness of breath, back pain.  CHEST - 2 VIEW  Comparison: 12/16/2010  Findings: Heart and mediastinal contours are within normal limits. No focal opacities or effusions.  No acute bony abnormality.  IMPRESSION: No active cardiopulmonary disease.  Original Report Authenticated By: Cyndie Chime, M.D.   Dg Chest 2 View  12/16/2010  *RADIOLOGY REPORT*  Clinical Data: Wheezing; history of asthma.  CHEST - 2 VIEW  Comparison: Chest radiograph performed 03/24/2010  Findings: The lungs are well-aerated.  Mild peribronchial thickening may reflect the patient's history of asthma.  There is no evidence  of focal opacification, pleural effusion or pneumothorax.  The heart is normal in size; the mediastinal contour is within normal limits.  No acute osseous abnormalities are seen.  IMPRESSION: Mild peribronchial thickening may reflect the patient's history of asthma; no evidence of focal consolidation.  Original Report Authenticated By: Tonia Ghent, M.D.    Medications:  I have reviewed the patient's current medications. Scheduled:   . albuterol  2 puff Inhalation Q6H  . beclomethasone  2 puff Inhalation BID  . docusate sodium  100 mg Oral BID  . heparin  5,000 Units Subcutaneous Q8H  . influenza  inactive virus vaccine  0.5 mL Intramuscular Tomorrow-1000  . pantoprazole  40 mg Oral Q1200  . predniSONE  50 mg Oral QAC breakfast  . senna  1 tablet Oral BID  . DISCONTD: albuterol  2.5 mg Nebulization Q4H   JXB:JYNWGNFAOZHYQ, acetaminophen, HYDROcodone-acetaminophen, ondansetron, DISCONTD: albuterol, DISCONTD: ondansetron (ZOFRAN) IV  Assessment/Plan: 30 yo female with asthma and GERD presents with acute asthma exacerbation most likely secondary to aerosol exposure.  1. Asthma exacerbation:  A: Moderate asthma exacerbation requiring every 2- 4 -hour albuterol nebulizer treatments. Patient improving, switch to Albuterol q 4, QVAR q 6, and plan for d/c with 5 day course of prednisone 2. Tachycardia. Resolved 12/3 3. Hypokalemia. Secondary to albuterol. Resolved w/ K+ repletion  12/2 4. DVT prophylaxis: Heparin 5000 units subcutaneous 3 times a day  5. GI prophylaxis: Protonix.  6. Disposition: To home this afternoon    LOS: 2 days   Mat Carne 12/18/2010,  8:29 AM

## 2010-12-18 NOTE — Discharge Summary (Signed)
Physician Discharge Summary  Patient ID: [name] [name] MRN: [id number] DOB/AGE: 29/29/1982 29 y.o. PCP: Clementeen Graham, M.D. @ [facility]    Admit date: 12/16/2010 Discharge date: 12/18/2010  Admission Diagnoses: Asthma Exacerbation   Discharge Diagnoses:  Principal Problem:  *Asthma with acute exacerbation Active Problems:  OBESITY NOS  GASTROESOPHAGEAL REFLUX, NO ESOPHAGITIS  Hypokalemia   Discharged Condition: good  Hospital Course: [name] [name] is a 30 year old woman with asthma who presented with an exacerbation on 12/16/10 after inhaling an aerosolized insect repellent. She tried albuterol, advair, singulair and Zyrtec in the outpatient setting. She is normally on beclamethasone, but had been without the prescription for 2 day, which likely made her more susceptible to an exacerbation.   1. Asthma Exacerbation - She was started in albuterol/ipratropium nebulizers q 4 hours scheduled and q 2 hours PRN. She was also given solumedrol IV. On hospital Day 2, the patient is appropriate for transition to albuterol and beclomethasone inhalers. Additionally, she will be discharged with a 5 day course of prednisone.   2. Hypokalemia - Her potassium level of 3.2 at admission was most likely from albuterol. It was repleted and was 4.2 on the day of discharge.    Consults: Respiratory Therapy    Significant Diagnostic Studies:  CHEST - 2 VIEW  Comparison: 12/16/2010  Findings: Heart and mediastinal contours are within normal limits.  No focal opacities or effusions. No acute bony abnormality.  IMPRESSION:  No active cardiopulmonary disease.   Treatments: noted in hospital course   Discharge Exam: Blood pressure 128/76, pulse 78, temperature 98.5 F (36.9 C), temperature source Oral, resp. rate 18, height 5' 7.2" (1.707 m), weight 231 lb 4.2 oz (104.9 kg), last menstrual period 11/30/2010, SpO2 97.00%. HEENT: NCAT  Resp: no accessory muscle use, wheezes in all lungs fields, good air movement    Cardiac: RRR, no murmurs  Extremities: no edema or cyanosis, 2+ peripheral pulses    Disposition:   Discharge Orders    Future Appointments: Provider: Department: Dept Phone: Center:   12/25/2010 1:45 PM Clementeen Graham [facility] Res[facility] [phone] [facility]     Current Discharge Medication List    CONTINUE these medications which have NOT CHANGED   Details  albuterol (PROVENTIL HFA;VENTOLIN HFA) 108 (90 BASE) MCG/ACT inhaler Inhale 2 puffs into the lungs every 4 (four) hours as needed. For shortness of breath or wheezing     albuterol (PROVENTIL) (2.5 MG/3ML) 0.083% nebulizer solution Take 2.5 mg by nebulization every 4 (four) hours as needed. For shortness of breath or wheezing     beclomethasone (QVAR) 80 MCG/ACT inhaler Inhale 2 puffs into the lungs 2 (two) times daily.      cetirizine (ZYRTEC ALLERGY) 10 MG tablet Take 1 tablet (10 mg total) by mouth daily. Qty: 30 tablet, Refills: 11    ibuprofen (ADVIL,MOTRIN) 200 MG tablet Take 200 mg by mouth every 6 (six) hours as needed. For pain     montelukast (SINGULAIR) 10 MG tablet Take 1 tablet (10 mg total) by mouth at bedtime. Qty: 30 tablet, Refills: 2    omeprazole (PRILOSEC OTC) 20 MG tablet Take 20 mg by mouth daily as needed. For reflux          Follow - Up Issues: Management of Asthma    Follow-up Information    Follow up with COREY,EVAN on 12/25/2010. (Appt @ 1:45 PM; call  at least 24 hours in advance if you need to change it!)    Contact information:   [address] [place] [address] [phone]          Signed: Mat Carne 12/18/2010, 2:09 PM

## 2010-12-25 ENCOUNTER — Inpatient Hospital Stay: Payer: Medicaid Other | Admitting: Family Medicine

## 2010-12-25 ENCOUNTER — Ambulatory Visit (INDEPENDENT_AMBULATORY_CARE_PROVIDER_SITE_OTHER): Payer: Medicaid Other | Admitting: Family Medicine

## 2010-12-25 ENCOUNTER — Encounter: Payer: Self-pay | Admitting: Family Medicine

## 2010-12-25 VITALS — BP 114/75 | HR 60 | Temp 98.7°F | Ht 67.2 in | Wt 237.0 lb

## 2010-12-25 DIAGNOSIS — J45909 Unspecified asthma, uncomplicated: Secondary | ICD-10-CM

## 2010-12-25 NOTE — Progress Notes (Signed)
[name] presents to clinic today to followup her asthma exacerbation requiring hospitalization. She has completed her course of prednisone and feels well. She notes her breathing is normal. She is taking albuterol Qvar and Singulair. She denies any dyspnea or wheeze. She is not smoking.   PMH reviewed.  ROS as above otherwise neg Medications reviewed. Current Outpatient Prescriptions  Medication Sig Dispense Refill  . albuterol (PROVENTIL HFA;VENTOLIN HFA) 108 (90 BASE) MCG/ACT inhaler Inhale 2 puffs into the lungs every 6 (six) hours. After 3 days, use every 4 hours as needed for shortness of breath or wheezing.  1 Inhaler  0  . albuterol (PROVENTIL) (2.5 MG/3ML) 0.083% nebulizer solution Take 2.5 mg by nebulization every 4 (four) hours as needed. For shortness of breath or wheezing       . beclomethasone (QVAR) 80 MCG/ACT inhaler Inhale 2 puffs into the lungs 2 (two) times daily.  1 Inhaler  2  . ibuprofen (ADVIL,MOTRIN) 200 MG tablet Take 200 mg by mouth every 6 (six) hours as needed. For pain       . montelukast (SINGULAIR) 10 MG tablet Take 1 tablet (10 mg total) by mouth at bedtime.  30 tablet  2  . omeprazole (PRILOSEC OTC) 20 MG tablet Take 20 mg by mouth daily as needed. For reflux        . cetirizine (ZYRTEC ALLERGY) 10 MG tablet Take 1 tablet (10 mg total) by mouth daily.  30 tablet  11    Exam:  BP 114/75  Pulse 60  Temp(Src) 98.7 F (37.1 C) (Oral)  Ht 5' 7.2" (1.707 m)  Wt 237 lb (107.502 kg)  BMI 36.90 kg/m2  SpO2 100%  LMP 11/30/2010 Gen: Well NAD Lungs: CTABL Nl WOB Heart: RRR no MRG Abd: NABS, NT, ND

## 2010-12-25 NOTE — Patient Instructions (Signed)
Thank you for coming in today. Please take QVAR daily.  Use albuterol as needed.  See me in 3 months.  Call Dr. Thurston Hole office and see you can get an appointment as you an established patient.

## 2010-12-25 NOTE — Assessment & Plan Note (Signed)
Currently doing well with Qvar 80 mcg 2 puffs twice a day albuterol and Singulair. Plan to continue current therapy. Also recommend that she followup with her pulmonologist sometime in the next few months. Follow up with me in 3 months or sooner if needed. No changes plan today.

## 2010-12-28 ENCOUNTER — Telehealth: Payer: Self-pay | Admitting: Family Medicine

## 2010-12-28 MED ORDER — ALBUTEROL SULFATE HFA 108 (90 BASE) MCG/ACT IN AERS: INHALATION_SPRAY | RESPIRATORY_TRACT | Status: DC

## 2011-01-10 ENCOUNTER — Ambulatory Visit: Payer: Medicaid Other | Admitting: Family Medicine

## 2011-01-12 ENCOUNTER — Ambulatory Visit (INDEPENDENT_AMBULATORY_CARE_PROVIDER_SITE_OTHER): Payer: Medicaid Other | Admitting: Family Medicine

## 2011-01-12 ENCOUNTER — Other Ambulatory Visit (HOSPITAL_COMMUNITY)
Admission: RE | Admit: 2011-01-12 | Discharge: 2011-01-12 | Disposition: A | Discharge status: Home / Self Care | Payer: Medicare Other | Source: Ambulatory Visit | Attending: Family Medicine | Admitting: Family Medicine

## 2011-01-12 ENCOUNTER — Encounter: Payer: Self-pay | Admitting: Family Medicine

## 2011-01-12 VITALS — BP 120/75 | HR 72 | Ht 67.0 in | Wt 235.0 lb

## 2011-01-12 DIAGNOSIS — Z124 Encounter for screening for malignant neoplasm of cervix: Secondary | ICD-10-CM | POA: Insufficient documentation

## 2011-01-12 DIAGNOSIS — N739 Female pelvic inflammatory disease, unspecified: Secondary | ICD-10-CM

## 2011-01-12 DIAGNOSIS — Z9189 Other specified personal risk factors, not elsewhere classified: Secondary | ICD-10-CM

## 2011-01-12 DIAGNOSIS — Z202 Contact with and (suspected) exposure to infections with a predominantly sexual mode of transmission: Secondary | ICD-10-CM

## 2011-01-12 DIAGNOSIS — N898 Other specified noninflammatory disorders of vagina: Secondary | ICD-10-CM

## 2011-01-12 LAB — POCT WET PREP (WET MOUNT)
Trichomonas Wet Prep HPF POC: NEGATIVE
Yeast Wet Prep HPF POC: NEGATIVE

## 2011-01-12 LAB — HIV ANTIBODY (ROUTINE TESTING W REFLEX): HIV: NONREACTIVE

## 2011-01-12 MED ORDER — METRONIDAZOLE 500 MG PO TABS: 500.0000 mg | ORAL_TABLET | Freq: Two times a day (BID) | ORAL | Status: DC

## 2011-01-12 NOTE — Progress Notes (Signed)
  Subjective:    Patient ID: [name], female    DOB: 1980-01-24, 30 y.o.   MRN: [id number]  HPI Pap smear/ STD screening: Patient states that she is here for STD screening. Has had only protected sex with one partner except for one time when condom busted. States she is concerned for STDs because of this incident and like to be tested. Also has noticed a vaginal discharge x1 week. No vaginal bleeding. No vaginal itching. No abdominal pain. No fever. States she would also like to get up-to-date on Pap smear   Review of Systems As per above.    Objective:   Physical Exam  Constitutional: She is oriented to person, place, and time. She appears well-developed and well-nourished.  HENT:  Head: Normocephalic and atraumatic.  Cardiovascular: Normal rate.   Pulmonary/Chest: Effort normal. No respiratory distress.  Abdominal: Soft. She exhibits no distension. There is no tenderness. There is no rebound.  Genitourinary: Vagina normal and uterus normal. There is no rash, tenderness, lesion or injury on the right labia. There is no rash, tenderness, lesion or injury on the left labia. Cervix exhibits no motion tenderness, no discharge and no friability. Right adnexum displays no mass, no tenderness and no fullness. Left adnexum displays no mass, no tenderness and no fullness.  Musculoskeletal: Normal range of motion. She exhibits no edema.  Neurological: She is alert and oriented to person, place, and time.  Skin: No rash noted.  Psychiatric: She has a normal mood and affect. Her behavior is normal.          Assessment & Plan:

## 2011-01-15 LAB — GC/CHLAMYDIA PROBE AMP, GENITAL
Chlamydia, DNA Probe: NEGATIVE
GC Probe Amp, Genital: NEGATIVE

## 2011-01-17 ENCOUNTER — Telehealth: Payer: Self-pay | Admitting: Family Medicine

## 2011-01-17 ENCOUNTER — Encounter: Payer: Self-pay | Admitting: Family Medicine

## 2011-01-17 NOTE — Telephone Encounter (Signed)
2ND attempt to call pt and let her know that I have called in RX for flagyl for BV. Left message again.   Left message before on same day of visit.  Will send letter with results and with notification that rx is at pharmacy.  On message, asked pt to please call if any concerns or questions.

## 2011-01-18 ENCOUNTER — Encounter: Payer: Self-pay | Admitting: Family Medicine

## 2011-01-20 ENCOUNTER — Inpatient Hospital Stay (HOSPITAL_BASED_OUTPATIENT_CLINIC_OR_DEPARTMENT_OTHER)
Admission: EM | Admit: 2011-01-20 | Discharge: 2011-01-22 | DRG: 203 | Disposition: A | Discharge status: Home / Self Care | Payer: Medicare Other | Attending: Family Medicine | Admitting: Family Medicine

## 2011-01-20 ENCOUNTER — Encounter (HOSPITAL_BASED_OUTPATIENT_CLINIC_OR_DEPARTMENT_OTHER): Payer: Self-pay | Admitting: Emergency Medicine

## 2011-01-20 ENCOUNTER — Emergency Department (INDEPENDENT_AMBULATORY_CARE_PROVIDER_SITE_OTHER): Payer: Medicare Other

## 2011-01-20 DIAGNOSIS — J45901 Unspecified asthma with (acute) exacerbation: Secondary | ICD-10-CM

## 2011-01-20 DIAGNOSIS — J45909 Unspecified asthma, uncomplicated: Secondary | ICD-10-CM | POA: Diagnosis not present

## 2011-01-20 DIAGNOSIS — Z01812 Encounter for preprocedural laboratory examination: Secondary | ICD-10-CM | POA: Diagnosis not present

## 2011-01-20 DIAGNOSIS — F1210 Cannabis abuse, uncomplicated: Secondary | ICD-10-CM | POA: Diagnosis present

## 2011-01-20 DIAGNOSIS — Z79899 Other long term (current) drug therapy: Secondary | ICD-10-CM | POA: Diagnosis not present

## 2011-01-20 DIAGNOSIS — J4540 Moderate persistent asthma, uncomplicated: Secondary | ICD-10-CM | POA: Diagnosis present

## 2011-01-20 DIAGNOSIS — K219 Gastro-esophageal reflux disease without esophagitis: Secondary | ICD-10-CM | POA: Diagnosis present

## 2011-01-20 DIAGNOSIS — T380X5A Adverse effect of glucocorticoids and synthetic analogues, initial encounter: Secondary | ICD-10-CM | POA: Diagnosis present

## 2011-01-20 DIAGNOSIS — J45902 Unspecified asthma with status asthmaticus: Secondary | ICD-10-CM | POA: Diagnosis present

## 2011-01-20 DIAGNOSIS — R0602 Shortness of breath: Secondary | ICD-10-CM

## 2011-01-20 DIAGNOSIS — R062 Wheezing: Secondary | ICD-10-CM | POA: Diagnosis not present

## 2011-01-20 DIAGNOSIS — D72829 Elevated white blood cell count, unspecified: Secondary | ICD-10-CM | POA: Diagnosis present

## 2011-01-20 DIAGNOSIS — F1910 Other psychoactive substance abuse, uncomplicated: Secondary | ICD-10-CM

## 2011-01-20 DIAGNOSIS — R079 Chest pain, unspecified: Secondary | ICD-10-CM

## 2011-01-20 HISTORY — DX: Gastro-esophageal reflux disease without esophagitis: K21.9

## 2011-01-20 MED ORDER — MORPHINE SULFATE 2 MG/ML IJ SOLN
2.0000 mg | INTRAMUSCULAR | Status: DC | PRN
  Administered 2011-01-20: 2 mg via INTRAVENOUS
  Filled 2011-01-20: qty 1

## 2011-01-20 MED ORDER — FLUTICASONE PROPIONATE HFA 44 MCG/ACT IN AERO
2.0000 | INHALATION_SPRAY | Freq: Two times a day (BID) | RESPIRATORY_TRACT | Status: DC
  Administered 2011-01-21 (×3): 2 via RESPIRATORY_TRACT
  Filled 2011-01-20: qty 10.6

## 2011-01-20 MED ORDER — ALBUTEROL (5 MG/ML) CONTINUOUS INHALATION SOLN
10.0000 mg/h | INHALATION_SOLUTION | RESPIRATORY_TRACT | Status: DC
  Filled 2011-01-20: qty 20

## 2011-01-20 MED ORDER — ALUM & MAG HYDROXIDE-SIMETH 200-200-20 MG/5ML PO SUSP: 30.0000 mL | Freq: Four times a day (QID) | ORAL | Status: DC | PRN

## 2011-01-20 MED ORDER — RACEPINEPHRINE HCL 2.25 % IN NEBU
0.5000 mL | INHALATION_SOLUTION | Freq: Once | RESPIRATORY_TRACT | Status: AC
  Administered 2011-01-20: 0.5 mL via RESPIRATORY_TRACT
  Filled 2011-01-20: qty 0.5

## 2011-01-20 MED ORDER — SODIUM CHLORIDE 0.9 % IV SOLN: INTRAVENOUS | Status: DC

## 2011-01-20 MED ORDER — ALBUTEROL SULFATE (5 MG/ML) 0.5% IN NEBU
INHALATION_SOLUTION | RESPIRATORY_TRACT | Status: AC
  Filled 2011-01-20: qty 2

## 2011-01-20 MED ORDER — LORATADINE 10 MG PO TABS
10.0000 mg | ORAL_TABLET | Freq: Every day | ORAL | Status: DC
  Administered 2011-01-21 – 2011-01-22 (×2): 10 mg via ORAL
  Filled 2011-01-20 (×2): qty 1

## 2011-01-20 MED ORDER — IBUPROFEN 600 MG PO TABS
600.0000 mg | ORAL_TABLET | Freq: Four times a day (QID) | ORAL | Status: DC | PRN
  Administered 2011-01-21: 600 mg via ORAL
  Filled 2011-01-20: qty 1

## 2011-01-20 MED ORDER — SODIUM CHLORIDE 0.9 % IV SOLN
INTRAVENOUS | Status: DC
  Administered 2011-01-20: via INTRAVENOUS

## 2011-01-20 MED ORDER — HYDROCODONE-ACETAMINOPHEN 5-325 MG PO TABS
1.0000 | ORAL_TABLET | ORAL | Status: DC | PRN
  Administered 2011-01-21 (×2): 2 via ORAL
  Filled 2011-01-20 (×2): qty 2

## 2011-01-20 MED ORDER — ALBUTEROL SULFATE (5 MG/ML) 0.5% IN NEBU
INHALATION_SOLUTION | RESPIRATORY_TRACT | Status: AC
  Administered 2011-01-20: 5 mg
  Filled 2011-01-20: qty 2

## 2011-01-20 MED ORDER — HEPARIN SODIUM (PORCINE) 5000 UNIT/ML IJ SOLN
5000.0000 [IU] | Freq: Three times a day (TID) | INTRAMUSCULAR | Status: DC
  Administered 2011-01-21 – 2011-01-22 (×4): 5000 [IU] via SUBCUTANEOUS
  Filled 2011-01-20 (×7): qty 1

## 2011-01-20 MED ORDER — PREDNISONE 50 MG PO TABS
50.0000 mg | ORAL_TABLET | Freq: Every day | ORAL | Status: DC
  Administered 2011-01-21 – 2011-01-22 (×2): 50 mg via ORAL
  Filled 2011-01-20 (×3): qty 1

## 2011-01-20 MED ORDER — ONDANSETRON HCL 4 MG/2ML IJ SOLN: 4.0000 mg | Freq: Four times a day (QID) | INTRAMUSCULAR | Status: DC | PRN

## 2011-01-20 MED ORDER — ONDANSETRON HCL 4 MG PO TABS: 4.0000 mg | ORAL_TABLET | Freq: Four times a day (QID) | ORAL | Status: DC | PRN

## 2011-01-20 MED ORDER — ALBUTEROL SULFATE (5 MG/ML) 0.5% IN NEBU
INHALATION_SOLUTION | RESPIRATORY_TRACT | Status: AC
  Filled 2011-01-20: qty 1

## 2011-01-20 MED ORDER — PREDNISONE 50 MG PO TABS
60.0000 mg | ORAL_TABLET | Freq: Once | ORAL | Status: AC
  Administered 2011-01-20: 60 mg via ORAL
  Filled 2011-01-20: qty 1

## 2011-01-20 MED ORDER — MONTELUKAST SODIUM 10 MG PO TABS
10.0000 mg | ORAL_TABLET | Freq: Every day | ORAL | Status: DC
  Administered 2011-01-21: 10 mg via ORAL
  Filled 2011-01-20 (×2): qty 1

## 2011-01-20 MED ORDER — ALBUTEROL SULFATE (5 MG/ML) 0.5% IN NEBU: 5.0000 mg | INHALATION_SOLUTION | RESPIRATORY_TRACT | Status: DC

## 2011-01-20 MED ORDER — PANTOPRAZOLE SODIUM 40 MG PO TBEC
40.0000 mg | DELAYED_RELEASE_TABLET | Freq: Every day | ORAL | Status: DC
  Administered 2011-01-20 – 2011-01-22 (×3): 40 mg via ORAL
  Filled 2011-01-20 (×3): qty 1

## 2011-01-20 MED ORDER — RACEPINEPHRINE HCL 2.25 % IN NEBU
INHALATION_SOLUTION | RESPIRATORY_TRACT | Status: AC
  Administered 2011-01-20: 0.5 mL via RESPIRATORY_TRACT
  Filled 2011-01-20: qty 0.5

## 2011-01-20 MED ORDER — ALBUTEROL SULFATE (5 MG/ML) 0.5% IN NEBU: 5.0000 mg | INHALATION_SOLUTION | RESPIRATORY_TRACT | Status: DC | PRN

## 2011-01-20 MED ORDER — OXYCODONE-ACETAMINOPHEN 5-325 MG PO TABS
1.0000 | ORAL_TABLET | Freq: Once | ORAL | Status: AC
  Administered 2011-01-20: 1 via ORAL
  Filled 2011-01-20: qty 1

## 2011-01-20 MED ORDER — ALBUTEROL SULFATE (5 MG/ML) 0.5% IN NEBU
5.0000 mg | INHALATION_SOLUTION | RESPIRATORY_TRACT | Status: DC
  Administered 2011-01-20 – 2011-01-21 (×7): 5 mg via RESPIRATORY_TRACT
  Filled 2011-01-20 (×3): qty 1
  Filled 2011-01-20: qty 0.5
  Filled 2011-01-20 (×2): qty 1
  Filled 2011-01-20: qty 0.5
  Filled 2011-01-20: qty 1

## 2011-01-20 MED ORDER — OMEPRAZOLE MAGNESIUM 20 MG PO TBEC: 20.0000 mg | DELAYED_RELEASE_TABLET | ORAL | Status: DC

## 2011-01-20 MED ORDER — DOCUSATE SODIUM 100 MG PO CAPS
100.0000 mg | ORAL_CAPSULE | Freq: Two times a day (BID) | ORAL | Status: DC | PRN
  Administered 2011-01-21: 100 mg via ORAL
  Filled 2011-01-20: qty 1

## 2011-01-20 MED ORDER — RACEPINEPHRINE HCL 2.25 % IN NEBU
0.5000 mL | INHALATION_SOLUTION | Freq: Once | RESPIRATORY_TRACT | Status: AC
  Administered 2011-01-20: 0.5 mL via RESPIRATORY_TRACT

## 2011-01-20 NOTE — ED Notes (Signed)
Shortness of breath, cough, congestion.

## 2011-01-20 NOTE — H&P (Signed)
[name] is an 31 y.o. female.   Chief Complaint: Wheezing HPI: The patient is a 31 year old female with severe persistent asthma who was last hospitalized for asthma at the beginning of December 2012. She presents today with one day of shortness of breath and wheezing that was refractory to her home albuterol treatments. She reports she was in her normal state of health at work yesterday but, beginning yesterday evening she started having some problems with wheezing. She tried her home albuterol treatments which seemed to work yesterday evening. This morning, however, she began to have worsening shortness of breath and wheezing. Her home albuterol did not appear to make this better. Accordingly she went to the emergency department. In the emergency department she was found to be tachypneic with significant wheezes. She was still tachypneic following 2 hours of continuous albuterol with relatively poor air movement per her report. Accordingly she was transferred to [facility] for admission.  The patient reports no symptoms prior to yesterday evening. She cannot point to any distinct triggers. Although the patient does not admit to it, the patient's mother reports that she intermittently smokes marijuana. Otherwise she denies any alcohol, tobacco, or other illicit substances. She is complaining of both chest and back pain that she says is related to her increased work of breathing and to the coughing she has been doing over the last 24 hours. She says that she typically gets this when she has a bad asthma attack.  The patient does report that, at one point in time, she did see an asthma and allergy specialist. She has not seen one in some time. She also does have a pulmonologist who she has not seen in a long time.  Past Medical History  Diagnosis Date  . Asthma      reservations to 2-3 timmes  a year that required ED visits/hospitalizations. Never intubated  . GERD (gastroesophageal reflux disease)      Past Surgical History  Procedure Date  . Cesarean section     Family History  Problem Relation Age of Onset  . Hypertension Mother   . Asthma Father   . Diabetes Father    Social History:  reports that she has quit smoking. She has never used smokeless tobacco. She reports that she drinks about .5 ounces of alcohol per week. She reports that she does not use illicit drugs., patient's mother reports THC use  Allergies:  Allergies  Allergen Reactions  . Iohexol Hives       . Moxifloxacin Other (See Comments)    unknown    Medications Prior to Admission  Medication Dose Route Frequency Provider Last Rate Last Dose  . 0.9 %  sodium chloride infusion   Intravenous Continuous Majel Homer, MD      . albuterol (PROVENTIL) (5 MG/ML) 0.5% nebulizer solution 5 mg  5 mg Nebulization Q1H PRN Majel Homer, MD       And  . albuterol (PROVENTIL) (5 MG/ML) 0.5% nebulizer solution 5 mg  5 mg Nebulization Q2H Majel Homer, MD   5 mg at 01/20/11 2305  . albuterol (PROVENTIL) (5 MG/ML) 0.5% nebulizer solution        5 mg at 01/20/11 1518  . albuterol (PROVENTIL) (5 MG/ML) 0.5% nebulizer solution           . alum & mag hydroxide-simeth (MAALOX/MYLANTA) 200-200-20 MG/5ML suspension 30 mL  30 mL Oral Q6H PRN Majel Homer, MD      . docusate sodium (COLACE) capsule 100 mg  100 mg Oral BID PRN Majel Homer, MD      . fluticasone (FLOVENT HFA) 44 MCG/ACT inhaler 2 puff  2 puff Inhalation BID Majel Homer, MD      . heparin injection 5,000 Units  5,000 Units Subcutaneous Q8H Majel Homer, MD      . HYDROcodone-acetaminophen (NORCO) 5-325 MG per tablet 1-2 tablet  1-2 tablet Oral Q4H PRN Majel Homer, MD      . ibuprofen (ADVIL,MOTRIN) tablet 600 mg  600 mg Oral Q6H PRN Majel Homer, MD      . loratadine (CLARITIN) tablet 10 mg  10 mg Oral Daily Majel Homer, MD      . montelukast (SINGULAIR) tablet 10 mg  10 mg Oral QHS Majel Homer, MD      . morphine 2 MG/ML injection 2 mg  2 mg Intravenous Q2H PRN Majel Homer, MD        . ondansetron [facility]) tablet 4 mg  4 mg Oral Q6H PRN Majel Homer, MD       Or  . ondansetron [facility]) injection 4 mg  4 mg Intravenous Q6H PRN Majel Homer, MD      . oxyCODONE-acetaminophen (PERCOCET) 5-325 MG per tablet 1 tablet  1 tablet Oral Once Nelia Shi, MD   1 tablet at 01/20/11 1952  . pantoprazole (PROTONIX) EC tablet 40 mg  40 mg Oral Q1200 Janice Coffin, [facility]      . predniSONE (DELTASONE) tablet 50 mg  50 mg Oral Q breakfast Majel Homer, MD      . predniSONE (DELTASONE) tablet 60 mg  60 mg Oral Once Nelia Shi, MD   60 mg at 01/20/11 1559  . Racepinephrine HCl 2.25 % nebulizer solution 0.5 mL  0.5 mL Nebulization Once Nelia Shi, MD   0.5 mL at 01/20/11 1711  . Racepinephrine HCl 2.25 % nebulizer solution 0.5 mL  0.5 mL Nebulization Once Nelia Shi, MD   0.5 mL at 01/20/11 1936  . DISCONTD: 0.9 %  sodium chloride infusion   Intravenous STAT Nelia Shi, MD      . DISCONTD: albuterol (PROVENTIL) (5 MG/ML) 0.5% nebulizer solution 5 mg  5 mg Nebulization Q4H Nelia Shi, MD      . DISCONTD: albuterol (PROVENTIL,VENTOLIN) solution continuous neb  10 mg/hr Nebulization Continuous Nelia Shi, MD 2 mL/hr at 01/20/11 1902 10 mg/hr at 01/20/11 1902  . DISCONTD: omeprazole (PRILOSEC OTC) EC tablet 20 mg  20 mg Oral Q0700 Majel Homer, MD       Medications Prior to Admission  Medication Sig Dispense Refill  . albuterol (PROVENTIL) (2.5 MG/3ML) 0.083% nebulizer solution Take 2.5 mg by nebulization every 4 (four) hours as needed. For shortness of breath or wheezing       . beclomethasone (QVAR) 80 MCG/ACT inhaler Inhale 2 puffs into the lungs 2 (two) times daily.        . cetirizine (ZYRTEC ALLERGY) 10 MG tablet Take 1 tablet (10 mg total) by mouth daily.  30 tablet  11  . ibuprofen (ADVIL,MOTRIN) 200 MG tablet Take 200 mg by mouth every 6 (six) hours as needed. For pain       . montelukast (SINGULAIR) 10 MG tablet Take 1 tablet (10 mg total) by mouth at  bedtime.  30 tablet  2  . omeprazole (PRILOSEC OTC) 20 MG tablet Take 20 mg by mouth daily as needed. For reflux          No results  found for this or any previous visit (from the past 48 hour(s)). Dg Chest 2 View  01/20/2011  *RADIOLOGY REPORT*  Clinical Data: Chest pain and shortness of breath.  History of asthma.  CHEST - 2 VIEW  Comparison: Chest x-ray 12/17/2010.  Findings: The cardiac silhouette, mediastinal and hilar contours are within normal limits and stable.  The lungs are clear.  No pleural effusion.  The bony thorax is intact.  IMPRESSION: No acute cardiopulmonary findings.  Original Report Authenticated By: P. Loralie Champagne, M.D.    Review of Systems  Constitutional: Negative for fever and chills.  HENT: Positive for congestion. Negative for sore throat.   Eyes: Negative for blurred vision and double vision.  Respiratory: Positive for cough, shortness of breath and wheezing. Negative for hemoptysis and sputum production.   Cardiovascular: Positive for chest pain. Negative for palpitations and orthopnea.  Gastrointestinal: Negative for heartburn, nausea, vomiting, abdominal pain and diarrhea.  Genitourinary: Negative for dysuria.  Musculoskeletal: Negative for myalgias.  Skin: Negative for rash.  Neurological: Negative for dizziness, loss of consciousness and headaches.  Endo/Heme/Allergies: Does not bruise/bleed easily.    Blood pressure 108/70, pulse 102, temperature 98.9 F (37.2 C), temperature source Oral, resp. rate 24, last menstrual period 01/20/2011, SpO2 98.00%. Physical Exam  Constitutional: She is oriented to person, place, and time. She appears well-developed and well-nourished. No distress.  HENT:  Head: Normocephalic and atraumatic.  Right Ear: External ear normal.  Left Ear: External ear normal.  Nose: Nose normal.  Mouth/Throat: Oropharynx is clear and moist.  Eyes: Conjunctivae and EOM are normal. Pupils are equal, round, and reactive to light.  Neck:  Normal range of motion. Neck supple. No JVD present.  Cardiovascular: Normal rate, regular rhythm, normal heart sounds and intact distal pulses.   Respiratory: Effort normal. She has wheezes. She exhibits no tenderness.       Diffuse wheezes throughout.  No focal findings.  No use of accessory muscles.  GI: Soft. Bowel sounds are normal. She exhibits no distension.  Musculoskeletal: Normal range of motion. She exhibits no edema.  Lymphadenopathy:    She has no cervical adenopathy.  Neurological: She is alert and oriented to person, place, and time. No cranial nerve deficit.  Skin: Skin is warm and dry.  Psychiatric: She has a normal mood and affect.     Assessment/Plan 1: Asthma: The patient presents with status asthmaticus. Her last albuterol with approximately one hour ago. She received oral steroids in the emergency department at [facility]. Currently, she does have diffuse wheezes but still has relatively good air movement and is not using any accessory muscles. She is not in obvious distress. Accordingly I will keep her in step down, continue telemetry, continue albuterol every 2 H. scheduled, Q1H when necessary. As the patient is moving air, I do not feel that further steroids or magnesium sulfate are indicated at this time. I will continue the patient's steroids tomorrow, and have had discussions with her and her family about the importance of outpatient followup.  Norco/morphine for pain control.  As the patient reports that this pain often happens with her asthma I do not feel that cardiac workup is necessary.  No Abx indicated as CXR shows no evidence of PNA.  2: Drug abuse: The patient was not willing to admit to me that she uses marijuana. The patient's mother told me this outside of the room and said that the patient would never admit to this to her physician. I  have not further address this with the patient, however I feel this might very well be contributing to the  patient's recurrent asthma attacks. If the opportunity presents itself, I feel would be appropriate to further address this during this admission and would most certainly be appropriate for her PCP to address.  3: Reflux: Continue the patient's home PPI. This is especially important given her high-dose steroids here.  4: Diet: Regular diet  5: Prophylaxis: Subcutaneous heparin, Protonix  6: Disposition: Pending improvement of the symptoms  Aerion Bagdasarian 01/20/2011, 11:10 PM

## 2011-01-20 NOTE — ED Provider Notes (Signed)
History   This chart was scribed for Nelia Shi, MD by Sofie Rower. The patient was seen in room MH03/MH03 and the patient's care was started at 3:10PM.    CSN: [id number]  Arrival date & time 01/20/11  1354   First MD Initiated Contact with Patient 01/20/11 1502      Chief Complaint  Patient presents with  . Shortness of Breath    (Consider location/radiation/quality/duration/timing/severity/associated sxs/prior treatment) HPI  [name] is a 31 y.o. female who presents to the Emergency Department complaining of moderate, constant shortness of breath onset last night with associated symptoms of loss of sleep, cough. Pt. Denies fever, rhinorrhea, Pt has had her flu shot and has a history of asthma. Pt PCP is Dr. Caesar Chestnut.  Past Medical History  Diagnosis Date  . Asthma      reservations to 2-3 timmes  a year that required ED visits/hospitalizations. Never intubated  . GERD (gastroesophageal reflux disease)     Past Surgical History  Procedure Date  . Cesarean section     Family History  Problem Relation Age of Onset  . Hypertension Mother   . Asthma Father   . Diabetes Father     History  Substance Use Topics  . Smoking status: Former Games developer  . Smokeless tobacco: Never Used  . Alcohol Use: 0.5 oz/week    1 drink(s) per week     occasionaly    OB History    Grav Para Term Preterm Abortions TAB SAB Ect Mult Living                  Review of Systems  10 Systems reviewed and are negative for acute change except as noted in the HPI.   Allergies  Iohexol and Moxifloxacin  Home Medications   Current Outpatient Rx  Name Route Sig Dispense Refill  . ALBUTEROL SULFATE HFA 108 (90 BASE) MCG/ACT IN AERS  Use at least every 6 hours for the first 3 days, and may use use up to every 4 hours if needed. 1 Inhaler 0    Please substitute Pro Air or other approved medici ...  . ALBUTEROL SULFATE (2.5 MG/3ML) 0.083% IN NEBU Nebulization Take 2.5 mg by nebulization  every 4 (four) hours as needed. For shortness of breath or wheezing     . BECLOMETHASONE DIPROPIONATE 80 MCG/ACT IN AERS Inhalation Inhale 2 puffs into the lungs 2 (two) times daily. 1 Inhaler 2  . CETIRIZINE HCL 10 MG PO TABS Oral Take 1 tablet (10 mg total) by mouth daily. 30 tablet 11  . IBUPROFEN 200 MG PO TABS Oral Take 200 mg by mouth every 6 (six) hours as needed. For pain     . METRONIDAZOLE 500 MG PO TABS Oral Take 1 tablet (500 mg total) by mouth 2 (two) times daily. X 7days 14 tablet 0  . MONTELUKAST SODIUM 10 MG PO TABS Oral Take 1 tablet (10 mg total) by mouth at bedtime. 30 tablet 2  . OMEPRAZOLE MAGNESIUM 20 MG PO TBEC Oral Take 20 mg by mouth daily as needed. For reflux        BP 142/95  Pulse 94  Temp(Src) 99.5 F (37.5 C) (Oral)  SpO2 98%  LMP 01/20/2011  Physical Exam  Nursing note and vitals reviewed. Constitutional: She is oriented to person, place, and time. She appears well-developed and well-nourished. No distress.  HENT:  Head: Normocephalic and atraumatic.  Eyes: EOM are normal. Pupils are equal, round, and reactive to  light.  Neck: Normal range of motion. Neck supple. No tracheal deviation present.  Cardiovascular: Normal rate and regular rhythm.   Pulmonary/Chest: Effort normal. No respiratory distress. She has wheezes (posteriorly.).  Abdominal: Soft. She exhibits no distension.  Musculoskeletal: Normal range of motion. She exhibits no edema.  Neurological: She is alert and oriented to person, place, and time. No sensory deficit.  Skin: Skin is warm and dry.  Psychiatric: She has a normal mood and affect. Her behavior is normal.    ED Course  Procedures (including critical care time) After 1 hour of nebulized albuterol and 60 by mouth prednisone patient continued to wheeze said she may be slightly better.  The plan at this time is to try 0.5 cc of racemic epinephrine.  If no improvement after that patient will probably need to be admitted.  Patient  feeling better after her racemic epinephrine.  Does not feel well and left to go home yet.  Plan a repeat racemic epi and reevaluate. DIAGNOSTIC STUDIES: Oxygen Saturation is 98% on room air, normal by my interpretation.    COORDINATION OF CARE:     Results for orders placed in visit on 01/12/11  POCT WET PREP (WET MOUNT)      Component Value Range   Source Wet Prep POC VAG     WBC, Wet Prep HPF POC 1-5     Bacteria Wet Prep HPF POC 3+ COCCI     Clue Cells, Wet Prep MODERATE     Yeast, Wet Prep NEG     Trichomonas Wet Prep HPF POC NEG    GC/CHLAMYDIA PROBE AMP, GENITAL      Component Value Range   Chlamydia, DNA Probe NEGATIVE     GC Probe Amp, Genital NEGATIVE    HIV ANTIBODY (ROUTINE TESTING)      Component Value Range   HIV NON REACTIVE  NON REACTIVE    Dg Chest 2 View  01/20/2011  *RADIOLOGY REPORT*  Clinical Data: Chest pain and shortness of breath.  History of asthma.  CHEST - 2 VIEW  Comparison: Chest x-ray 12/17/2010.  Findings: The cardiac silhouette, mediastinal and hilar contours are within normal limits and stable.  The lungs are clear.  No pleural effusion.  The bony thorax is intact.  IMPRESSION: No acute cardiopulmonary findings.  Original Report Authenticated By: P. Loralie Champagne, M.D.    CRITICAL CARE Performed by: Nelva Nay L   Total critical care time: 30 min.  Critical care time was exclusive of separately billable procedures and treating other patients.  Critical care was necessary to treat or prevent imminent or life-threatening deterioration.  Critical care was time spent personally by me on the following activities: development of treatment plan with patient and/or surrogate as well as nursing, discussions with consultants, evaluation of patient's response to treatment, examination of patient, obtaining history from patient or surrogate, ordering and performing treatments and interventions, ordering and review of laboratory studies, ordering and  review of radiographic studies, pulse oximetry and re-evaluation of patient's condition.    Diagnosis:  Status asthmaticus MDM    I personally performed the services described in this documentation, which was scribed in my presence. The recorded information has been reviewed and considered.    Nelia Shi, MD 01/20/11 2014

## 2011-01-20 NOTE — ED Notes (Signed)
Pt reports pain remains a 9-10

## 2011-01-20 NOTE — Progress Notes (Signed)
CAT stopped. Pt continues to have bilat I/E wheezes. Pt splinting and in pain from breathing. Pt sats 100% via 100FI02/ RR 18-20.

## 2011-01-21 LAB — CBC
MCH: 26.8 pg (ref 26.0–34.0)
MCHC: 33.1 g/dL (ref 30.0–36.0)
Platelets: 381 10*3/uL (ref 150–400)
RDW: 13.9 % (ref 11.5–15.5)

## 2011-01-21 LAB — BASIC METABOLIC PANEL
BUN: 7 mg/dL (ref 6–23)
Calcium: 9.3 mg/dL (ref 8.4–10.5)
Creatinine, Ser: 0.54 mg/dL (ref 0.50–1.10)
GFR calc non Af Amer: >90 mL/min (ref >90)
Glucose, Bld: 119 mg/dL — ABNORMAL HIGH (ref 70–99)
Sodium: 140 mEq/L (ref 135–145)

## 2011-01-21 LAB — MRSA PCR SCREENING: MRSA by PCR: NEGATIVE

## 2011-01-21 MED ORDER — ALBUTEROL SULFATE (5 MG/ML) 0.5% IN NEBU
2.5000 mg | INHALATION_SOLUTION | RESPIRATORY_TRACT | Status: DC | PRN
  Administered 2011-01-21: 2.5 mg via RESPIRATORY_TRACT

## 2011-01-21 MED ORDER — ALBUTEROL SULFATE (5 MG/ML) 0.5% IN NEBU
2.5000 mg | INHALATION_SOLUTION | RESPIRATORY_TRACT | Status: DC
  Administered 2011-01-21 – 2011-01-22 (×5): 2.5 mg via RESPIRATORY_TRACT
  Filled 2011-01-21 (×6): qty 0.5

## 2011-01-21 NOTE — H&P (Signed)
FMTS Attending Admit NOte  Patient seen and examined by me at @0430am .  Discussed with resident team and I agree with assessment and plan as per resident note.  Briefly, a 31yoF with known history of asthma, never previously requiring intubation and cannot name triggers, who presents for acute onset dyspnea, not improved by home albuterol nebulizer and with incomplete response to continuous nebs in the ED at [facility].  This morning she reports feeling much improved, does not feel dyspneic and does not have a cough.  She denies recent fevers/chills, rhinorrhea, or other URI symptoms.  She did receive the flu shot earlier this flu season.  She lives with her 50 yo son who has eczema; has other family members with asthma.  She denies smoking, alcohol or recreational drug use to me when asked.  Social history from resident note reviewed.  Exam: Well appearing, comfortable, speaking fluidly and not using accessory muscles.  No apparent distress. HEENT Neck supple, moist mucus membranes. No cervical adenopathy COR S1S2, no extra sounds or murmurs PULM Patient with only very minimal and occasional wheezes; no rales.  Good air movement noted.   Imp/Plan: Patient with acute asthma exacerbation; appears markedly improved from previous exam.  Would plan to discharge home with short oral prednisone course, as well as scheduled albuterol nebs and close outpatient followup.  I agree with probing about THC as a possible trigger as mentioned in the resident note.Paula Compton, MD

## 2011-01-21 NOTE — Progress Notes (Signed)
Subjective: After sponge bathing this morning, started having difficulty breathing. She feels like her breathing does okay until she starts moving or being more active. Last breathing treatment was about 2 hours ago.   Pain?    Reproducible substernal chest pain with deep inspirations.   Diffuse lumbar back pain.   Other ROS: Denies nausea.  Objective: Vital signs in last 24 hours: Temp:  [97.9 F (36.6 C)-99.6 F (37.6 C)] 99.6 F (37.6 C) (01/06 0748) Pulse Rate:  [91-102] 94  (01/06 0748) Resp:  [17-28] 21  (01/06 0748) BP: (108-142)/(57-95) 118/85 mmHg (01/06 0748) SpO2:  [96 %-100 %] 100 % (01/06 0801) Weight:  [234 lb 5.6 oz (106.3 kg)] 234 lb 5.6 oz (106.3 kg) (01/06 0000) Weight change:  Last BM Date: 01/20/11  Intake/Output from previous day: 01/05 0701 - 01/06 0700 In: 1115 [I.V.:875] Out: 400 [Urine:400] Intake/Output this shift:   Physical exam: Gen: moderate increased WOB Psych: engaged, appropriate, conversant CV: RRR Pulm: Mild suprasternal retractions. Diffuse moderately loud wheezing throughout, worse on right. Decreased air movement bilaterally. No rales or ronchi.  Abd: NT, ND  Lab Results:  Basename 01/21/11 0535  WBC 13.2*  HGB 11.7*  HCT 35.4*  PLT 381   BMET  Basename 01/21/11 0535  NA 140  K 4.0  CL 106  CO2 24  GLUCOSE 119*  BUN 7  CREATININE 0.54  CALCIUM 9.3    Studies/Results: Dg Chest 2 View  01/20/2011  *RADIOLOGY REPORT*  Clinical Data: Chest pain and shortness of breath.  History of asthma.  CHEST - 2 VIEW  Comparison: Chest x-ray 12/17/2010.  Findings: The cardiac silhouette, mediastinal and hilar contours are within normal limits and stable.  The lungs are clear.  No pleural effusion.  The bony thorax is intact.  IMPRESSION: No acute cardiopulmonary findings.  Original Report Authenticated By: P. Loralie Champagne, M.D.    Medications: I have reviewed the patient's current medications.  Assessment/Plan: [name] is a 31  YO African-American female with a history of persistent asthma on QVAR and Singulair at home presenting with status asthmaticus.   1: Asthma She is now sating well on RA and her respiratory status is improved, however, she is still having dyspnea with mild exertion. She has not required any albuterol q1.  History: The patient presented with status asthmaticus. She received oral steroids in the emergency department at [facility]. -Continue albuterol nebs q2/q1 through this morning. Consider transitioning her to albuterol q4/2 in the afternoon if she does better and transferring her to a regular blood bed.  -Continue Flovent and steroids, which will be given as 2-week taper considering recent previous exacerbation.  -Continue Singulair and Claritin.   2: Drug abuse Denies THC or tobacco. History: during admission, the patient's mother expressed concern that the patient is using THC.  -Will check UDS.  3: Reflux  -Continue the patient's home PPI. This is especially important given her high-dose steroids here.   4: FEN/GI  -Regular diet  -IVF now at 125. Will discontinue since she is tolerating PO.   5: Prophylaxis: Subcutaneous heparin, Protonix   6: Disposition: Pending improvement of the symptoms. Possible discharge tomorrow.   7: Leukocytosis  Likely 2/2 steroids. Infection unlikely as her respiratory status is improving and she is afebrile withotu antibiotics. -Will not check CBC tomorrow unless further indication.    LOS: 1 day   [place] PARK, [name] 01/21/2011, 10:45 AM

## 2011-01-22 LAB — RAPID URINE DRUG SCREEN, HOSP PERFORMED: Opiates: POSITIVE — AB

## 2011-01-22 MED ORDER — ALBUTEROL SULFATE HFA 108 (90 BASE) MCG/ACT IN AERS
2.0000 | INHALATION_SPRAY | RESPIRATORY_TRACT | Status: DC
  Administered 2011-01-22 (×2): 2 via RESPIRATORY_TRACT
  Filled 2011-01-22: qty 6.7

## 2011-01-22 MED ORDER — ALBUTEROL SULFATE HFA 108 (90 BASE) MCG/ACT IN AERS
2.0000 | INHALATION_SPRAY | RESPIRATORY_TRACT | Status: DC | PRN
  Filled 2011-01-22: qty 6.7

## 2011-01-22 MED ORDER — PREDNISONE (PAK) 10 MG PO TABS: ORAL_TABLET | ORAL | Status: DC

## 2011-01-22 MED ORDER — POLYETHYLENE GLYCOL 3350 17 G PO PACK
17.0000 g | PACK | Freq: Every day | ORAL | Status: DC
  Administered 2011-01-22: 17 g via ORAL
  Filled 2011-01-22: qty 1

## 2011-01-22 NOTE — Progress Notes (Signed)
I  discussed the care plan with Dr. Mikel Cella and the FPTS team and agree with assessment and plan as documented in the progress note for today.    Vencent Hauschild A. Sheffield Slider, MD Family Medicine Teaching Service Attending  01/22/2011 7:55 PM

## 2011-01-22 NOTE — Progress Notes (Signed)
Patient ID: [name], female   DOB: 02-Jan-1981, 31 y.o.   MRN: [id number] PGY-1 Daily Progress Note Family Medicine Teaching Service Billy Turvey M. Elba Schaber, MD Service Pager: [phone]  Subjective: Patient states she is feeling much better today. She has not been out of bed yet. Last breathing treatment one hour ago.  Other ROS: Denies nausea.  Objective: Vital signs in last 24 hours: Temp:  [98 F (36.7 C)-98.6 F (37 C)] 98.6 F (37 C) (01/07 0704) Pulse Rate:  [75-105] 83  (01/07 0704) Resp:  [19-30] 20  (01/07 0704) BP: (101-155)/(48-95) 155/82 mmHg (01/07 0704) SpO2:  [93 %-100 %] 94 % (01/07 0828) FiO2 (%):  [98 %] 98 % (01/07 0010) Weight:  [234 lb 9.1 oz (106.4 kg)] 234 lb 9.1 oz (106.4 kg) (01/07 0040) Weight change: 3.5 oz (0.1 kg) Last BM Date: 01/20/11  Intake/Output from previous day: 01/06 0701 - 01/07 0700 In: 2700 [P.O.:1200; I.V.:1500] Out: 1250 [Urine:1250] Intake/Output this shift:   Physical exam: Gen: Sitting in bed, talking in full sentences, no distress CV: RRR Pulm: Diffuse wheezing in all lung fields. Good air movement. Some cough with deep inspiration.  Abd: NT, ND Neuro: Grossly intact  Lab Results:  Basename 01/21/11 0535  WBC 13.2*  HGB 11.7*  HCT 35.4*  PLT 381   BMET  Basename 01/21/11 0535  NA 140  K 4.0  CL 106  CO2 24  GLUCOSE 119*  BUN 7  CREATININE 0.54  CALCIUM 9.3    Studies/Results: Dg Chest 2 View  01/20/2011  *RADIOLOGY REPORT*  Clinical Data: Chest pain and shortness of breath.  History of asthma.  CHEST - 2 VIEW  Comparison: Chest x-ray 12/17/2010.  Findings: The cardiac silhouette, mediastinal and hilar contours are within normal limits and stable.  The lungs are clear.  No pleural effusion.  The bony thorax is intact.  IMPRESSION: No acute cardiopulmonary findings.  Original Report Authenticated By: P. Loralie Champagne, M.D.    Medications: I have reviewed the patient's current  medications.  Assessment/Plan: [name] is a 31 YO African-American female with a history of persistent asthma on QVAR and Singulair at home presenting with status asthmaticus.   1: Asthma No O2 requirement, overall improved. History: The patient presented with status asthmaticus. She received CAT, racemic epi and oral steroids in the emergency department at [facility]. -On Albuterol nebs q4/q2 currently. Will switch to MDI today, if needed prior to discharge.  -Continue Flovent and steroids, which will be given as 2-week taper considering recent previous exacerbation.  -Continue Singulair and Claritin.   2: Drug abuse Denies THC or tobacco. UDS + for THC History: during admission, the patient's mother expressed concern that the patient is using THC.  -Unable to discuss drug use with patient this morning given the company in the room. Will talk about triggers, including THC, this afternoon before patient leaves the hospital.   3: Reflux  -Continue the patient's home PPI. This is especially important given her high-dose steroids here.   4: FEN/GI  -Regular diet  -IVF at [facility]  5: Prophylaxis: Subcutaneous heparin, Protonix  6: Disposition: Probable discharge home today. Would like to see patient out of bed and moving around. Encouraged her to ask for Albuterol if needed. Will reassess after rounds, and discharge then if appropriate.    LOS: 2 days   Shelagh Rayman 01/22/2011, 10:14 AM

## 2011-01-22 NOTE — Progress Notes (Signed)
Pt discharged home.  Pt driving self home.  Pt verbalized understanding of all discharge instructions.

## 2011-01-24 DIAGNOSIS — Z124 Encounter for screening for malignant neoplasm of cervix: Secondary | ICD-10-CM | POA: Insufficient documentation

## 2011-01-24 DIAGNOSIS — Z202 Contact with and (suspected) exposure to infections with a predominantly sexual mode of transmission: Secondary | ICD-10-CM | POA: Insufficient documentation

## 2011-01-24 NOTE — Assessment & Plan Note (Signed)
Pap smear obtained and sent to lab. 

## 2011-01-24 NOTE — Discharge Summary (Signed)
Physician Discharge Summary  Patient ID: [name] MRN: [id number] DOB/AGE: Apr 11, 1980 31 y.o.  Admit date: 01/20/2011 Discharge date: 01/22/2011  Admission Diagnoses: Acute Asthma exacerbation  Discharge Diagnoses:  Active Problems:  GASTROESOPHAGEAL REFLUX, NO ESOPHAGITIS  ASTHMA, PERSISTENT, SEVERE  Status asthmaticus  Drug abuse, marijuana   Discharged Condition: stable  Hospital Course: Patient is a 31 yo F with known history of asthma who presented to [facility] with acute asthma exacerbation. In brief, her hospital problems are as follows: 1: Asthma: The patient presented with status asthmaticus. She received CAT x2 hours, racemic epi and oral steroids in the emergency department at [facility] prior to transfer to [facility]. She was admitted to stepdown. She had an O2 requirement and was getting nebs q2/q1. By hospital day 2, patient was transferred to the floor. She had no O2 requirement, no fevers, and was doing well with ambulation on room air.  At discharge, patient was to continue home Albuterol MDI, Flovent and steroids, which will be given as 2-week taper considering recent previous exacerbation. She will also continue Singulair and Claritin, which she states she has at home as well. Patient will have close follow up at [facility] within one week. She is aware of red flags that will prompt her return for evaluation. She states she will be able to have her steroids filled.  2: Drug abuse Denies THC or tobacco. UDS + for THC.  During admission, the patient's mother expressed concern that the patient is using THC. This could be a potential trigger of her asthma. Unable to fully discuss drug use with patient this given the company in the room. Would recommend for PCP to further investigate drug use, and discuss this could be triggering her asthma, especially considering cutting agents and additives. 3: Reflux: Continued the patient's home PPI throughout admission.  This is especially important given her high-dose steroids. She will continue this after discharge as well.   Consults: none  Significant Diagnostic Studies:  Dg Chest 2 View 01/20/2011  *RADIOLOGY REPORT*  Clinical Data: Chest pain and shortness of breath.  History of asthma.  CHEST - 2 VIEW  Comparison: Chest x-ray 12/17/2010.  Findings: The cardiac silhouette, mediastinal and hilar contours are within normal limits and stable.  The lungs are clear.  No pleural effusion.  The bony thorax is intact.  IMPRESSION: No acute cardiopulmonary findings.  Original Report Authenticated By: P. Loralie Champagne, M.D.   Treatments: IV hydration, steroids: prednisone and respiratory therapy: O2 and albuterol/atropine nebulizer  Discharge Exam: Blood pressure 155/82, pulse 83, temperature 98.6 F (37 C), temperature source Oral, resp. rate 20, height 5\' 6"  (1.676 m), weight 234 lb 9.1 oz (106.4 kg), last menstrual period 01/20/2011, SpO2 94.00%.  Recommendations for Follow up: - Make sure patient filled her Rx for steroids and that she is completing long taper - Discuss drug use with patient as well as possible triggers - Encourage her to continue to use all home medications as prescribed Disposition: Home or Self Care  Discharge Orders    Future Orders Please Complete By Expires   Diet - low sodium heart healthy      Increase activity slowly      Call MD for:  temperature >100.4      Call MD for:  persistant nausea and vomiting      Call MD for:  difficulty breathing, headache or visual disturbances      Call MD for:  extreme fatigue  Medication List  As of 01/24/2011 12:49 PM   START taking these medications         predniSONE 10 MG tablet   Commonly known as: STERAPRED UNI-PAK   5 tabs x2 days, 4 tabs x4 days, 2 tabs x 4 days, then 1 tab for 4 days         CONTINUE taking these medications         albuterol (2.5 MG/3ML) 0.083% nebulizer solution   Commonly known as: PROVENTIL       beclomethasone 80 MCG/ACT inhaler   Commonly known as: QVAR      cetirizine 10 MG tablet   Commonly known as: ZYRTEC   Take 1 tablet (10 mg total) by mouth daily.      ibuprofen 200 MG tablet   Commonly known as: ADVIL,MOTRIN      montelukast 10 MG tablet   Commonly known as: SINGULAIR   Take 1 tablet (10 mg total) by mouth at bedtime.      omeprazole 20 MG tablet   Commonly known as: PRILOSEC OTC          Where to get your medications    These are the prescriptions that you need to pick up. We sent them to a specific pharmacy, so you will need to go there to get them.   [facility] - [place], [place] - [address]    [address] [place] [address]    Phone: [phone]        predniSONE 10 MG tablet           Follow-up Information    Follow up with [facility]. Make an appointment in 5 days.   Contact information:   [facility] [phone]         Signed: Rodman Pickle 01/24/2011, 12:49 PM

## 2011-01-24 NOTE — Assessment & Plan Note (Signed)
GC/Chlam/ wet prep/ HIV and RPR obtained.  Will call or mail results to pt.

## 2011-01-31 ENCOUNTER — Encounter: Payer: Self-pay | Admitting: Family Medicine

## 2011-01-31 ENCOUNTER — Ambulatory Visit (INDEPENDENT_AMBULATORY_CARE_PROVIDER_SITE_OTHER): Payer: Medicare Other | Admitting: Family Medicine

## 2011-01-31 DIAGNOSIS — F1210 Cannabis abuse, uncomplicated: Secondary | ICD-10-CM

## 2011-01-31 DIAGNOSIS — J45909 Unspecified asthma, uncomplicated: Secondary | ICD-10-CM

## 2011-01-31 DIAGNOSIS — B370 Candidal stomatitis: Secondary | ICD-10-CM | POA: Insufficient documentation

## 2011-01-31 MED ORDER — NYSTATIN 100000 UNIT/ML MT SUSP: 500000.0000 [IU] | Freq: Four times a day (QID) | OROMUCOSAL | Status: DC

## 2011-01-31 MED ORDER — BUDESONIDE-FORMOTEROL FUMARATE 160-4.5 MCG/ACT IN AERO: 2.0000 | INHALATION_SPRAY | Freq: Two times a day (BID) | RESPIRATORY_TRACT | Status: DC

## 2011-01-31 NOTE — Progress Notes (Signed)
  Subjective:    Patient ID: [name], female    DOB: 1980-06-10, 31 y.o.   MRN: [id number]  HPI Here for hospital follow-up  Has severe asthmas exacerbation requiring CAT, was hospitalized early January.  Is now completing 10 of 14 days of prednisone taper.  Has not have any more dyspnea or coughing.  Has not used marijuana.  Is compliant with qv ar, prednisone,. And Singulair.  Is using albuterol every 4-6 hours but does not feel dyspneic.   Review of Systems See HPI    Objective:   Physical Exam  GEN: Alert & Oriented, No acute distress CV:  Regular Rate & Rhythm, no murmur Respiratory:  Normal work of breathing, scattered wheezes, decreased air movement throughout. Abd:  + BS, soft, no tenderness to palpation Ext: no pre-tibial edema Peak flow decreased 351      Assessment & Plan:

## 2011-01-31 NOTE — Assessment & Plan Note (Signed)
Discussed importance of abstinence given increased risks with severity of asthma.  She is agreeable

## 2011-01-31 NOTE — Patient Instructions (Addendum)
Stop qvar, will start symbicort.  Today your peak flow was 360.  Your expected flow is 491  Finish up steroids  Make follow-up  with Dr. Alvester Morin

## 2011-01-31 NOTE — Assessment & Plan Note (Signed)
Discussed rinsing mouth after inhaled steroid.  rxed nystatin

## 2011-01-31 NOTE — Assessment & Plan Note (Signed)
Given severity of asthma, persistant wheezing with decreased peak flow despite 10 days of prednisone, will escalate therapy from qvar to symbicort.  She has follow-up with pulmonology in 2 weeks.  Discussed red flags for urgent evaluation, discussed avoidance of respiratory irritants, specifically marijuana.

## 2011-02-22 ENCOUNTER — Encounter: Payer: Self-pay | Admitting: Internal Medicine

## 2011-02-22 ENCOUNTER — Ambulatory Visit (INDEPENDENT_AMBULATORY_CARE_PROVIDER_SITE_OTHER): Payer: Medicare Other | Admitting: Internal Medicine

## 2011-02-22 DIAGNOSIS — J45909 Unspecified asthma, uncomplicated: Secondary | ICD-10-CM

## 2011-02-22 MED ORDER — FAMOTIDINE 20 MG PO TABS: ORAL_TABLET | ORAL | Status: DC

## 2011-02-22 NOTE — Patient Instructions (Signed)
Pepcid 20 mg one at bedtime  GERD (REFLUX)  is an extremely common cause of respiratory symptoms(night times hot flashes coughing wheezing) , many times with no significant heartburn at all.    It can be treated with medication, but also with lifestyle changes including avoidance of late meals, excessive alcohol, smoking cessation, and avoid fatty foods, chocolate, peppermint, colas, red wine, and acidic juices such as orange juice.  NO MINT OR MENTHOL PRODUCTS SO NO COUGH DROPS  USE SUGARLESS CANDY INSTEAD (jolley ranchers or Stover's)  NO OIL BASED VITAMINS - use powdered substitutes.    Plan A is to use the symbicort 160 Take 2 puffs first thing in am and then another 2 puffs about 12 hours later and the singulair at night  If you can't catch your breath, go to Plan B= ok to use ventolin hfa if needed up to every 4 hours with the goal of less than twice weekly  If you still catch your breath Plan C = use the nebulizer up to 4 hours and call us right away for appt to see me or Tammy Nurse Practioner.  Work on inhaler technique:  relax and gently blow all the way out then take a nice smooth deep breath back in, triggering the inhaler at same time you start breathing in.  Hold for up to 5 seconds if you can.  Rinse and gargle with water when done   If your mouth or throat starts to bother you,   I suggest you time the inhaler to your dental care and after using the inhaler(s) brush teeth and tongue with a baking soda containing toothpaste and when you rinse this out, gargle with it first to see if this helps your mouth and throat.     Please schedule a follow up office visit in 4 weeks, sooner if needed

## 2011-02-22 NOTE — Progress Notes (Signed)
  Subjective:    Patient ID: [name], female    DOB: [birth year]   MRN: [id number]  HPI  30 yobf minimal smoking hx quit in 2003 eval by by Willa Rough 2011 and some better then back to hosp Dec 2012 and Jan 2013 so referred to [facility] 02/22/2011 by Dr Alvester Morin  02/22/2011 Pulmonary ov/Wert 1st since 2009        Past Medical History:  g1p1,  hx abn pap, repeat wnl,  knee injury 719.46 --- 08/09/2003,  numbness in L. antecubital fossa s/p norplant d/c.   Family History:  allergic rhinitis/asthma--sister,  dad, cad-gm,  obesity--mom and sister   Social History:  Lives with mom and two sisters.  Dad rarely involved.;    02/22/2011 f/u ov/[facility] cc runny nose no sob on symbicort 2 bid and singulair and  not needing albuterol in 3 weeks last prednisone x 3 weeks prior to OV.  Minimal mucoid sputum  Sleeping ok without nocturnal  or early am exacerbation  of respiratory  c/o's or need for noct saba. Also denies any obvious fluctuation of symptoms with weather or environmental changes or other aggravating or alleviating factors except as outlined above     Review of Systems  Constitutional: Negative for fever and unexpected weight change.  HENT: Positive for congestion and sore throat. Negative for ear pain, nosebleeds, rhinorrhea, sneezing, trouble swallowing, dental problem, postnasal drip and sinus pressure.   Eyes: Negative for redness and itching.  Respiratory: Positive for cough and shortness of breath. Negative for chest tightness and wheezing.   Cardiovascular: Positive for chest pain. Negative for palpitations and leg swelling.  Gastrointestinal: Negative for nausea and vomiting.  Genitourinary: Negative for dysuria.  Musculoskeletal: Positive for joint swelling.  Skin: Negative for rash.  Neurological: Positive for headaches.  Hematological: Does not bruise/bleed easily.  Psychiatric/Behavioral: Negative for dysphoric mood. The patient is nervous/anxious.          Objective:   Physical Exam    in general this is an ambulatory pleasant black female in no acute distress with minimal pseudo-wheeze and hoarseness  Wt  211 02/16/2009 >  248 02/22/2011 HEENT mild turbinate edema. Oropharynx no thrush or excess pnd or cobblestoning. No JVD or cervical adenopathy. Mild accessory muscle hypertrophy. Trachea midline, nl thryroid. Chest was hyperinflated by percussion with diminished breath sounds and moderate increased exp time with trace bilateral end exp wheeze. Hoover sign positive at mid inspiration. Regular rate and rhythm without murmur gallop or rub or increase P2. Abd: no hsm, nl excursion. Ext warm without C,C or E.      Assessment & Plan:

## 2011-02-25 NOTE — Assessment & Plan Note (Addendum)
DDX of  difficult airways managment all start with A and  include Adherence, Ace Inhibitors, Acid Reflux, Active Sinus Disease, Alpha 1 Antitripsin deficiency, Anxiety masquerading as Airways dz,  ABPA,  allergy(esp in young), Aspiration (esp in elderly), Adverse effects of DPI,  Active smokers, plus two Bs  = Bronchiectasis and Beta blocker use..and one C= CHF   Adherence is always the initial "prime suspect" and is a multilayered concern that requires a "trust but verify" approach in every patient - starting with knowing how to use medications, especially inhalers, correctly, keeping up with refills and understanding the fundamental difference between maintenance and prns vs those medications only taken for a very short course and then stopped and not refilled. The proper method of use, as well as anticipated side effects, of this metered-dose inhaler are discussed and demonstrated to the patient. Improved to 75% with extensive coaching  Acid reflux > at risk of poor control due to wt  Rx diet/ meds reviewed  Allergies > f/u Dr Willa Rough  ? Active smoking > apparently "just"  THC per records reviewed  See instructions for specific recommendations which were reviewed directly with the patient who was given a copy with highlighter outlining the key components.  Note action plan to avoid ER outlined and discussed in detail

## 2011-03-09 ENCOUNTER — Encounter: Payer: Self-pay | Admitting: Family Medicine

## 2011-03-09 ENCOUNTER — Ambulatory Visit (INDEPENDENT_AMBULATORY_CARE_PROVIDER_SITE_OTHER): Payer: Medicare Other | Admitting: Family Medicine

## 2011-03-09 DIAGNOSIS — J309 Allergic rhinitis, unspecified: Secondary | ICD-10-CM

## 2011-03-09 DIAGNOSIS — H00019 Hordeolum externum unspecified eye, unspecified eyelid: Secondary | ICD-10-CM | POA: Diagnosis not present

## 2011-03-09 NOTE — Patient Instructions (Signed)
Sty A sty (hordeolum) is an infection of a gland in the eyelid located at the base of the eyelash. A sty may develop a white or yellow head of pus. It can be puffy (swollen). Usually, the sty will burst and pus will come out on its own. They do not leave lumps in the eyelid once they drain. A sty is often confused with another form of cyst of the eyelid called a chalazion. Chalazions occur within the eyelid and not on the edge where the bases of the eyelashes are. They often are red, sore and then form firm lumps in the eyelid. CAUSES   Germs (bacteria).   Lasting (chronic) eyelid inflammation.  SYMPTOMS   Tenderness, redness and swelling along the edge of the eyelid at the base of the eyelashes.   Sometimes, there is a white or yellow head of pus. It may or may not drain.  DIAGNOSIS  An ophthalmologist will be able to distinguish between a sty and a chalazion and treat the condition appropriately.  TREATMENT   Styes are typically treated with warm packs (compresses) until drainage occurs.   In rare cases, medicines that kill germs (antibiotics) may be prescribed. These antibiotics may be in the form of drops, cream or pills.   If a hard lump has formed, it is generally necessary to do a small incision and remove the hardened contents of the cyst in a minor surgical procedure done in the office.   In suspicious cases, your caregiver may send the contents of the cyst to the lab to be certain that it is not a rare, but dangerous form of cancer of the glands of the eyelid.  HOME CARE INSTRUCTIONS   Wash your hands often and dry them with a clean towel. Avoid touching your eyelid. This may spread the infection to other parts of the eye.   Apply heat to your eyelid for 10 to 20 minutes, several times a day, to ease pain and help to heal it faster.   Do not squeeze the sty. Allow it to drain on its own. Wash your eyelid carefully 3 to 4 times per day to remove any pus.  SEEK IMMEDIATE  MEDICAL CARE IF:   Your eye becomes painful or puffy (swollen).   Your vision changes.   Your sty does not drain by itself within 3 days.   Your sty comes back within a short period of time, even with treatment.   You have redness (inflammation) around the eye.   You have a fever.  Document Released: 10/11/2004 Document Revised: 09/13/2010 Document Reviewed: 06/15/2008 Exit[employer] Patient Information 2012 [employer]. 

## 2011-03-09 NOTE — Assessment & Plan Note (Signed)
Uncomplicated sty.  No evidence of conjunctivitis or cellulitis of lid.    Advised warm compresses, discusses natural course and red flags for return.

## 2011-03-09 NOTE — Progress Notes (Signed)
  Subjective:    Patient ID: [name], female    DOB: 1980/10/26, 31 y.o.   MRN: [id number]  HPI Work in appt for left eyelid swelling x 2-3 days  Left eye started off ed and tender.  Now redness had improved but notices a lump in her eyelid near her lashes.  No crusting or drainage. No red eye or eye pain or foreign body sensation.  No change in vision. Has not tried anything.  Has not had this before.  Wears mascara.   Review of Systems See HPI    Objective:   Physical Exam  GEN: NAD EYE;  EOMI, PERRLA, no conjunctivitis.  Middle of left eyelid near lash line is 1-2 mm soft induration- likley beginning of stye.  Mildly tender to palpation.  No cellulitis.  Inspected under lid, no pus or pustule.      Assessment & Plan:

## 2011-03-27 ENCOUNTER — Ambulatory Visit (INDEPENDENT_AMBULATORY_CARE_PROVIDER_SITE_OTHER): Payer: Medicare Other | Admitting: Family Medicine

## 2011-03-27 ENCOUNTER — Encounter: Payer: Self-pay | Admitting: Family Medicine

## 2011-03-27 VITALS — BP 137/79 | HR 84 | Ht 66.0 in | Wt 246.0 lb

## 2011-03-27 DIAGNOSIS — R21 Rash and other nonspecific skin eruption: Secondary | ICD-10-CM | POA: Insufficient documentation

## 2011-03-27 DIAGNOSIS — L509 Urticaria, unspecified: Secondary | ICD-10-CM

## 2011-03-27 MED ORDER — PREDNISONE 50 MG PO TABS: ORAL_TABLET | ORAL | Status: AC

## 2011-03-27 NOTE — Assessment & Plan Note (Signed)
Rash consistent generalized pruritic reaction. Likely allergy induced given baseline severe allergic disease. I suspect this may be environmentally induced as major pollens and grasses are blooming along this same timeline. Will place on short course of prednisone for this. General red flags reviewed. Will follow up next week.

## 2011-03-27 NOTE — Progress Notes (Signed)
  Subjective:    Patient ID: [name], female    DOB: [birth year], 31 y.o.   MRN: [id number]  HPI Patient is a today with chief complaint of rash. Patient states she's noticed it generalized erythematous and pruritic rash for the past 3-4 days. Patient denies any new change in bedding, detergent, perfume, carpeting,, housing. Patient has a baseline history of severe allergic disease in maternal multiple medications for this. Patient is also being treated for an allergist. Patient denies any systemic symptoms including fever, nausea, vomiting, dysuria, diarrhea, abdominal pain. Rashes predominantly on forearms as well as Lasix. Distribution is flexor areas. Patient denies any history of eczema in the past.   Review of Systems See HPI, otherwise ROS negative     Objective:   Physical Exam  Skin:          Diffuse 0.5-1 cm blanching erythematous patches on forearms and medial thigh. No scaling.    Gen: in bed, obese         Assessment & Plan:

## 2011-03-27 NOTE — Patient Instructions (Signed)
Your rash is likely coming from allergies I'm placing a short course of prednisone for this For symptoms get worse please call us  Be sure to followup with me next week to followup on this Call if any questions God Bless Doree Albee MD

## 2011-03-28 ENCOUNTER — Encounter: Payer: Medicare Other | Admitting: Internal Medicine

## 2011-03-28 NOTE — Progress Notes (Signed)
 This encounter was created in error - please disregard.

## 2011-04-04 ENCOUNTER — Encounter: Payer: Self-pay | Admitting: Family Medicine

## 2011-04-04 ENCOUNTER — Ambulatory Visit (INDEPENDENT_AMBULATORY_CARE_PROVIDER_SITE_OTHER): Payer: Medicare Other | Admitting: Family Medicine

## 2011-04-04 VITALS — BP 129/87 | HR 88 | Ht 66.0 in | Wt 252.0 lb

## 2011-04-04 DIAGNOSIS — R21 Rash and other nonspecific skin eruption: Secondary | ICD-10-CM | POA: Diagnosis not present

## 2011-04-04 MED ORDER — HYDROXYZINE HCL 50 MG PO TABS: 50.0000 mg | ORAL_TABLET | Freq: Three times a day (TID) | ORAL | Status: AC | PRN

## 2011-04-04 NOTE — Assessment & Plan Note (Addendum)
Overall case reviewed with Dr. Sheffield Slider. Patient does exhibit dermatographia on physical exam which is consistent with a high histamine load. Will add on hydroxyzine to current regimen to decrease general pruritus. Discussed with patient that overall etiology of current urticaria is relatively unclear although she does have a significant allergic history. Gen. red flags were discussed with patient. May consider dermatology referral if this progresses despite maximal antihistaminergic treatment.

## 2011-04-04 NOTE — Patient Instructions (Signed)
Hives Hives (urticaria) are itchy, red, swollen patches on the skin. They may change size, shape, and location quickly and repeatedly. Hives that occur deeper in the skin can cause swelling of the hands, feet, and face. Hives may be an allergic reaction to something you or your child ate, touched, or put on the skin. Hives can also be a reaction to cold, heat, viral infections, medication, insect bites, or emotional stress. Often the cause is hard to find. Hives can come and go for several days to several weeks. Hives are not contagious. HOME CARE INSTRUCTIONS   If the cause of the hives is known, avoid exposure to that source.   To relieve itching and rash:   Apply cold compresses to the skin or take cool water baths. Do not take or give your child hot baths or showers because the warmth will make the itching worse.   The best medicine for hives is an antihistamine. An antihistamine will not cure hives, but it will reduce their severity. You can use an antihistamine available over the counter. This medicine may make your child sleepy. Teenagers should not drive while using this medicine.   Take or give an antihistamine every 6 hours until the hives are completely gone for 24 hours or as directed.   Your child may have other medications prescribed for itching. Give these as directed by your child's caregiver.   You or your child should wear loose fitting clothing, including undergarments. Skin irritations may make hives worse.   Follow-up as directed by your caregiver.  SEEK MEDICAL CARE IF:   You or your child still have considerable itching after taking the medication (prescribed or purchased over the counter).   Joint swelling or pain occurs.  SEEK IMMEDIATE MEDICAL CARE IF:   You have a fever.   Swollen lips or tongue are noticed.   There is difficulty with breathing, swallowing, or tightness in the throat or chest.   Abdominal pain develops.   Your child starts acting very  sick.  These may be the first signs of a life-threatening allergic reaction. THIS IS AN EMERGENCY. Call 911 for medical help. MAKE SURE YOU:   Understand these instructions.   Will watch your condition.   Will get help right away if you are not doing well or get worse.  Document Released: 01/01/2005 Document Revised: 12/21/2010 Document Reviewed: 08/22/2007 [employer] Patient Information 2012 [employer]. 

## 2011-04-04 NOTE — Progress Notes (Signed)
  Subjective:    Patient ID: [name], female    DOB: 1980/01/29, 31 y.o.   MRN: [id number]  HPI Patient presents today for followup. Patient seen for this on March 12 [employer] no soft urticaria. Patient was prescribed prednisone as well as Pepcid in addition to her Zyrtec for secondary antihistamine blockade. Today, patient states that hives are essentially unchanged since last visit. Prednisone had no effect on on itching or rash. Patient states she's noticed new areas of rash after itching since last clinical visit. Overall, patient states that rash has not worsened, however the rash has not improved. Old areas of rash have resolved a new areas rash of taking their place. Patient denies any fevers, new medications, new foods. Patient does report that she has been fasting from meat for the last 3 weeks. No new detergents, no new bedding. Patient does have a baseline history of severe allergies and sees an allergist for this. Rash has progressed to involve the distal lower extremity bilaterally as well as anterior chest and posterior upper back. Patient states she scratches areas and then developed secondary rash.   Review of Systems See HPI, otherwise    Objective:   Physical Exam  Constitutional: She appears well-developed.  Skin:             Assessment & Plan:

## 2011-04-16 ENCOUNTER — Telehealth: Payer: Self-pay | Admitting: Family Medicine

## 2011-04-16 ENCOUNTER — Other Ambulatory Visit: Payer: Self-pay | Admitting: Family Medicine

## 2011-04-16 DIAGNOSIS — L509 Urticaria, unspecified: Secondary | ICD-10-CM

## 2011-04-16 NOTE — Telephone Encounter (Signed)
Ms. Hulon is requesting a referral to a dermatologist because she's is not having any relief from previous methods that she's been on.  Please call her with info

## 2011-04-16 NOTE — Telephone Encounter (Signed)
Derm referral made. Please contact pt. Thank you.

## 2011-04-16 NOTE — Telephone Encounter (Signed)
Called [facility]. No appt available until September. Left message with the triage nurse to receive appt before September. Waiting for call back.  [name], Renato Battles

## 2011-04-17 NOTE — Telephone Encounter (Signed)
Called pt and informed of appointment with Dr.Tafeen 05-15-11 at 11 am. Info given to pt and repeated back to me. She will keep appt. ([facility] has no avail.appt until sept) .Arlyss Repress

## 2011-04-30 ENCOUNTER — Other Ambulatory Visit: Payer: Self-pay | Admitting: Family Medicine

## 2011-05-08 ENCOUNTER — Ambulatory Visit (INDEPENDENT_AMBULATORY_CARE_PROVIDER_SITE_OTHER): Payer: Medicare Other | Admitting: Emergency Medicine

## 2011-05-08 ENCOUNTER — Encounter: Payer: Self-pay | Admitting: Emergency Medicine

## 2011-05-08 VITALS — BP 120/76 | HR 73 | Ht 66.0 in | Wt 257.6 lb

## 2011-05-08 DIAGNOSIS — O039 Complete or unspecified spontaneous abortion without complication: Secondary | ICD-10-CM | POA: Insufficient documentation

## 2011-05-08 DIAGNOSIS — N912 Amenorrhea, unspecified: Secondary | ICD-10-CM | POA: Diagnosis not present

## 2011-05-08 LAB — POCT URINE PREGNANCY: Preg Test, Ur: NEGATIVE

## 2011-05-08 NOTE — Assessment & Plan Note (Signed)
With negative pregnancy test today, she likely had a miscarriage.  Discussed with patient, at this time she is not overly upset.  Encouraged her to call if she needed anything or would like a referral to a counselor.  She is to return to clinic is her periods to not return to normal in the next 2-3 months.

## 2011-05-08 NOTE — Patient Instructions (Signed)
It was nice to meet you.  You likely had a miscarriage as your pregnancy test today is negative.  You can take ibuprofen as needed for cramps.  Your periods should go back to normal in the next 2-3 months.  If they continue to be irregular, please come back.  Also, if you need any help processing, please give [place] a call.

## 2011-05-08 NOTE — Progress Notes (Signed)
  Subjective:    Patient ID: [name], female    DOB: [birth year], 31 y.o.   MRN: [id number]  HPI [name] is here today for a same day appointment for possible pregnancy.  She reports a normal period in February, then had light spotting for a period in March.  Last week she took two home pregnancy tests that were positive.  Yesterday she started her period, but it was heavier than normal with bad cramps.  Has gain some weight in the last month and has felt more fatigued.  Patient states she was not trying to get pregnant, but it would be okay if she was pregnant.   Review of Systems See HPI    Objective:   Physical Exam BP 120/76  Pulse 73  Ht 5\' 6"  (1.676 m)  Wt 257 lb 9.6 oz (116.847 kg)  BMI 41.58 kg/m2  LMP 05/07/2011 Gen: alert, cooperative, NAD CV: RRR, no murmur Pulm: CTAB, no wheezes Abd: soft, NTND      Assessment & Plan:

## 2011-06-19 ENCOUNTER — Other Ambulatory Visit: Payer: Self-pay | Admitting: Family Medicine

## 2011-08-28 ENCOUNTER — Other Ambulatory Visit: Payer: Self-pay | Admitting: *Deleted

## 2011-08-28 MED ORDER — BUDESONIDE-FORMOTEROL FUMARATE 160-4.5 MCG/ACT IN AERO: 2.0000 | INHALATION_SPRAY | Freq: Two times a day (BID) | RESPIRATORY_TRACT | Status: DC

## 2011-10-24 ENCOUNTER — Ambulatory Visit: Payer: Medicare Other | Admitting: Family Medicine

## 2011-10-27 ENCOUNTER — Other Ambulatory Visit: Payer: Self-pay | Admitting: Family Medicine

## 2011-12-17 ENCOUNTER — Other Ambulatory Visit: Payer: Self-pay | Admitting: *Deleted

## 2011-12-17 MED ORDER — BUDESONIDE-FORMOTEROL FUMARATE 160-4.5 MCG/ACT IN AERO: 2.0000 | INHALATION_SPRAY | Freq: Two times a day (BID) | RESPIRATORY_TRACT | Status: DC

## 2011-12-19 ENCOUNTER — Telehealth: Payer: Self-pay | Admitting: Family Medicine

## 2011-12-19 MED ORDER — MONTELUKAST SODIUM 10 MG PO TABS: 10.0000 mg | ORAL_TABLET | Freq: Every day | ORAL | Status: DC

## 2011-12-19 NOTE — Telephone Encounter (Signed)
Called patient and explained to her that she has never been seen by Dr Ashley Royalty and that I will send in one month supply and she will need an office visit for any further refills.Danilo Cappiello, Rodena Medin

## 2011-12-19 NOTE — Telephone Encounter (Signed)
NEEDS REFILL ON HER SINGULAIR  [facility] [address]

## 2012-01-24 ENCOUNTER — Encounter: Payer: Self-pay | Admitting: Family Medicine

## 2012-01-24 ENCOUNTER — Ambulatory Visit (INDEPENDENT_AMBULATORY_CARE_PROVIDER_SITE_OTHER): Payer: Medicare Other | Admitting: Family Medicine

## 2012-01-24 VITALS — BP 134/83 | HR 67 | Ht 66.0 in | Wt 255.0 lb

## 2012-01-24 DIAGNOSIS — O200 Threatened abortion: Secondary | ICD-10-CM | POA: Diagnosis not present

## 2012-01-24 DIAGNOSIS — Z3201 Encounter for pregnancy test, result positive: Secondary | ICD-10-CM | POA: Diagnosis not present

## 2012-01-24 DIAGNOSIS — N912 Amenorrhea, unspecified: Secondary | ICD-10-CM | POA: Diagnosis not present

## 2012-01-27 NOTE — Progress Notes (Signed)
  Subjective:    Patient ID: [name], female    DOB: Apr 08, 1980, 32 y.o.   MRN: [id number]  HPI 1. Amenorrhea:  Here for possible pregnancy.  She missed her menstrual period this month and had a positive pregnancy test at home on 12/2.  Date of LMP was 12/4.  She does endorse some mild nausea and feels tired.  She did have some blood on the toilet paper after going to the restroom this morning.  She denies cramping, heavy bleeding, vaginal discharge or dysuria.    Review of Systems Per HPI    Objective:   Physical Exam  Constitutional:       Obese female, nad   Neck: Neck supple.  Cardiovascular: Normal rate and regular rhythm.   Abdominal: Soft. Bowel sounds are normal. She exhibits no distension. There is no tenderness.  Musculoskeletal: She exhibits no edema.          Assessment & Plan:

## 2012-01-27 NOTE — Assessment & Plan Note (Signed)
Very mildly positive pregnancy test in our clinic.  Given that she had a positive pregnancy test at home a few days ago, a test this mildly positive may indicate a downtrend of HCG.  Given spotting this am will check quantitative HCG

## 2012-01-28 ENCOUNTER — Telehealth: Payer: Self-pay | Admitting: Family Medicine

## 2012-01-28 ENCOUNTER — Other Ambulatory Visit: Payer: Medicare Other

## 2012-01-28 DIAGNOSIS — O200 Threatened abortion: Secondary | ICD-10-CM | POA: Diagnosis not present

## 2012-01-28 NOTE — Telephone Encounter (Signed)
Pt states she will come in this PM for repeat BW.

## 2012-01-28 NOTE — Telephone Encounter (Signed)
Message copied by Barnie Alderman on Mon Jan 28, 2012 11:44 AM ------      Message from: Everrett Coombe      Created: Mon Jan 28, 2012 11:37 AM       Lab work shows very low HCG indicating likely miscarriage.  Please have her come in tomorrow for repeat of this lab.

## 2012-01-28 NOTE — Progress Notes (Signed)
HCG DONE TODAY Jillyn Stacey 

## 2012-02-03 ENCOUNTER — Other Ambulatory Visit: Payer: Self-pay | Admitting: Family Medicine

## 2012-03-13 ENCOUNTER — Ambulatory Visit (INDEPENDENT_AMBULATORY_CARE_PROVIDER_SITE_OTHER): Payer: Medicare Other | Admitting: Family Medicine

## 2012-03-13 ENCOUNTER — Encounter: Payer: Self-pay | Admitting: Family Medicine

## 2012-03-13 VITALS — BP 126/78 | HR 73 | Ht 66.0 in | Wt 257.0 lb

## 2012-03-13 DIAGNOSIS — N912 Amenorrhea, unspecified: Secondary | ICD-10-CM | POA: Diagnosis not present

## 2012-03-13 DIAGNOSIS — Z3201 Encounter for pregnancy test, result positive: Secondary | ICD-10-CM | POA: Diagnosis not present

## 2012-03-13 LAB — POCT URINE PREGNANCY: Preg Test, Ur: POSITIVE

## 2012-03-13 NOTE — Patient Instructions (Addendum)
Thank you for coming in today, it was good to see you Your pregnancy test is positive Start taking a prenatal vitamin daily Be sure to not use omeprazole and ibuprofen  You may use tylenol for pain Plan to make a prenatal appointment soon.

## 2012-03-14 ENCOUNTER — Telehealth: Payer: Self-pay | Admitting: Family Medicine

## 2012-03-14 MED ORDER — PRENATAL 27-0.8 MG PO TABS: 1.0000 | ORAL_TABLET | Freq: Every day | ORAL | Status: DC

## 2012-03-14 NOTE — Telephone Encounter (Signed)
Patient was seen yesterday and was told an Rx for Prenatal Vitamins would be sent to [facility] on [address], but they have not received them.

## 2012-03-14 NOTE — Telephone Encounter (Signed)
completed

## 2012-03-14 NOTE — Telephone Encounter (Signed)
Fwd. To Dr.Matthews for rx. [name], [name]

## 2012-03-17 NOTE — Progress Notes (Signed)
  Subjective:    Patient ID: [name], female    DOB: 1980-02-14, 32 y.o.   MRN: [id number]  HPI  1. ? Pregnancy:  Patient here with possible pregnancy.  Had miscarriage in early January but now has had 2 positive pregnancy tests at home.  No period since passing tissue from previous pregnancy on 01/24/12.  She does endorse some mild nausea and frequent urination but denies dysuria or pain.  She has some clear vaginal discharge without odor or irritation. Denies vaginal bleeding or discharge  Review of Systems Per HPI    Objective:   Physical Exam  Constitutional:  Obese female, nad   HENT:  Head: Normocephalic and atraumatic.  Neck: Neck supple. No thyromegaly present.  Cardiovascular: Normal rate, regular rhythm and normal heart sounds.   Pulmonary/Chest: Effort normal and breath sounds normal.  Abdominal: Soft. She exhibits no distension. There is no tenderness.  Musculoskeletal: She exhibits no edema.  Neurological: She is alert.          Assessment & Plan:

## 2012-03-17 NOTE — Assessment & Plan Note (Addendum)
Positive pregnancy test in our clinic.  History of recent miscarriage but seems to be doing well.  Not taking PNV yet, will send in rx for this.  Other medications she is currently taking appear ok to use during pregnancy other than bronchodilators for her history of severe asthma.  Currently using laba/ics as well as saba.  I think the benefits of her continuing this outweigh potential fetal harm and I discussed this with her.  Plans to have PNC at out clinic, insturcted to make appointment.  Given pregnancy verification letter.

## 2012-03-19 ENCOUNTER — Other Ambulatory Visit: Payer: Medicare Other

## 2012-03-19 ENCOUNTER — Other Ambulatory Visit: Payer: Self-pay | Admitting: Family Medicine

## 2012-03-19 DIAGNOSIS — Z3480 Encounter for supervision of other normal pregnancy, unspecified trimester: Secondary | ICD-10-CM | POA: Diagnosis not present

## 2012-03-19 NOTE — Progress Notes (Signed)
PRENATAL LABS DONE TODAY MARCI HOLDER 

## 2012-03-20 LAB — HIV ANTIBODY (ROUTINE TESTING W REFLEX): HIV: NONREACTIVE

## 2012-03-20 LAB — SICKLE CELL SCREEN: Sickle Cell Screen: NEGATIVE

## 2012-03-20 LAB — OBSTETRIC PANEL
Basophils Absolute: 0 10*3/uL (ref 0.0–0.1)
Basophils Relative: 0 % (ref 0–1)
Eosinophils Relative: 2 % (ref 0–5)
Lymphocytes Relative: 27 % (ref 12–46)
MCV: 78.5 fL (ref 78.0–100.0)
Platelets: 323 10*3/uL (ref 150–400)
RDW: 14.9 % (ref 11.5–15.5)
Rubella: 17.5 Index — ABNORMAL HIGH (ref <0.90)
WBC: 10.7 10*3/uL — ABNORMAL HIGH (ref 4.0–10.5)

## 2012-03-22 LAB — CULTURE, OB URINE: Colony Count: 55000

## 2012-03-26 ENCOUNTER — Other Ambulatory Visit (HOSPITAL_COMMUNITY)
Admission: RE | Admit: 2012-03-26 | Discharge: 2012-03-26 | Disposition: A | Discharge status: Home / Self Care | Payer: Medicare Other | Source: Ambulatory Visit | Attending: Family Medicine | Admitting: Family Medicine

## 2012-03-26 ENCOUNTER — Encounter: Payer: Self-pay | Admitting: Family Medicine

## 2012-03-26 ENCOUNTER — Ambulatory Visit (INDEPENDENT_AMBULATORY_CARE_PROVIDER_SITE_OTHER): Payer: Medicare Other | Admitting: Family Medicine

## 2012-03-26 VITALS — BP 122/67 | Wt 255.0 lb

## 2012-03-26 DIAGNOSIS — Z01419 Encounter for gynecological examination (general) (routine) without abnormal findings: Secondary | ICD-10-CM | POA: Diagnosis not present

## 2012-03-26 DIAGNOSIS — Z113 Encounter for screening for infections with a predominantly sexual mode of transmission: Secondary | ICD-10-CM | POA: Insufficient documentation

## 2012-03-26 DIAGNOSIS — Z3481 Encounter for supervision of other normal pregnancy, first trimester: Secondary | ICD-10-CM

## 2012-03-26 DIAGNOSIS — Z1151 Encounter for screening for human papillomavirus (HPV): Secondary | ICD-10-CM | POA: Insufficient documentation

## 2012-03-26 DIAGNOSIS — Z3480 Encounter for supervision of other normal pregnancy, unspecified trimester: Secondary | ICD-10-CM

## 2012-03-26 LAB — POCT WET PREP (WET MOUNT)

## 2012-03-26 NOTE — Patient Instructions (Signed)
Thank you for coming in today, it was good to see you We will arrange an ultrasound to confirm your dating Make a follow up with your pulmonologist Continue your pre natal vitamin Follow up with me in 4 weeks.

## 2012-03-26 NOTE — Progress Notes (Signed)
32 yo [id number] here for initial OB visit.  Reports that her LMP is 01/24/12, after a miscarriage at that time.  She does not have any complaints.  She has a history of severe persistent asthma that has resulted in multiple hospitalization in the past, however she is well controlled at this time.  She is followed by Dr. Sherene Sires.  She is taking a PNV.  She denies any bleeding, cramping, nausea, or vaginal discharge.  She does not have any first degree relative with diabetes and previous pregnancy was uncomplicated.  She denies smoking, ilicit drug use, or EtOH.  She declines any genetic screening.   [facility] Pregnancy Home Risk Screening Form  1. Thinking back to just before you got pregnant how did you feel about becoming pregnant? []   I wanted to be pregnant sooner. []   I wanted to be pregnant now. []   I wanted to be pregnant later. []   I did not want to be pregnant then or any time in the future [x]   I don't know  2.  Within the last year, have you been hit, slapped, kicked or otherwise physically hurt by someone?  No  3.  Are you in a relationship with a person who threatens or physically hurts you? No  4.  Has anyone forced you to have sexual activities that made you feel uncomfortable? No  5.  In the last 12 months were you ever hungry but didn't east because you couldn't afford enough food?  No  6.  Is your living situation unsafe or unstable?  No  7.  Which statement best describes your smoking status?   [x]   I have never smoked, or have smoked less than 100 cigarettes in my lifetime []   I stopped smoking BEFORE I found out I was pregnant and am not smoking now []   I stopped smoking AFTER I found out I was pregnant and am not smoking now []   I smoke now but have cut down some since I found out I was pregnant []   I smoke about the same amount now as I did before I found out I was pregnant  8.  Did any of your parents have a problem with alcohol or other drug use? No  9.  Do any of your  friends have a problem with alcohol or other drug use? No  10.  Does your partner have a problem with alcohol or other drug use? No  11.  In the past, have you had difficulties in your life due to alcohol or other drugs, including prescription medications? No  12.  Before you knew you were pregnant, how often did you drink any alcohol, including beer or wine, or use other drugs?  []   Not at all []   Rarely [x]   Sometimes []   Frequently  13.  In the past month, how often did you drink any alcohol, including beer or wine, or use another drug? [x]   Not at all []   Rarely []   Sometimes []   Frequently   PHQ-9:  0  O: HEENT: [place]/AT, PERRL, No scleral icterus.  Oropharynx moist. Neck: Supple without thyromegaly Pulm:  CTAB CV:  RRR, no m/r/g Ext: No swelling, tenderness or edema GU:  Pelvic exam chaperoned by Jimmy Footman Moderate amount of gray-white vaginal discharge. Cervix appears normal.  Pap smear/wet prep collected Bimanual without any tenderness or CMT.  A/P Supervision of normal pregnancy Declines genetic screening Obesity is only risk factor for GDM, will not do early  glucola  [place] ordered for dating confirmation Flu Vaccine given and instructed to follow up with pulmonology in case they have any suggestions while she is pregnant Await wet prep results Follow up in 4 weeks

## 2012-03-28 ENCOUNTER — Ambulatory Visit (HOSPITAL_COMMUNITY)
Admission: RE | Admit: 2012-03-28 | Discharge: 2012-03-28 | Disposition: A | Discharge status: Home / Self Care | Payer: Medicare Other | Source: Ambulatory Visit | Attending: Family Medicine | Admitting: Family Medicine

## 2012-03-28 ENCOUNTER — Other Ambulatory Visit: Payer: Self-pay | Admitting: Internal Medicine

## 2012-03-28 DIAGNOSIS — Z3481 Encounter for supervision of other normal pregnancy, first trimester: Secondary | ICD-10-CM

## 2012-03-28 DIAGNOSIS — O34219 Maternal care for unspecified type scar from previous cesarean delivery: Secondary | ICD-10-CM | POA: Insufficient documentation

## 2012-03-28 DIAGNOSIS — E669 Obesity, unspecified: Secondary | ICD-10-CM | POA: Diagnosis not present

## 2012-03-28 DIAGNOSIS — O99210 Obesity complicating pregnancy, unspecified trimester: Secondary | ICD-10-CM | POA: Diagnosis not present

## 2012-03-28 MED ORDER — METRONIDAZOLE 500 MG PO TABS: 500.0000 mg | ORAL_TABLET | Freq: Two times a day (BID) | ORAL | Status: DC

## 2012-03-28 NOTE — Addendum Note (Signed)
Addended by: Everrett Coombe on: 03/28/2012 04:36 PM   Modules accepted: Orders

## 2012-03-31 ENCOUNTER — Other Ambulatory Visit: Payer: Self-pay | Admitting: Family Medicine

## 2012-03-31 ENCOUNTER — Telehealth: Payer: Self-pay | Admitting: *Deleted

## 2012-03-31 MED ORDER — METRONIDAZOLE 0.75 % VA GEL: 1.0000 | Freq: Two times a day (BID) | VAGINAL | Status: DC

## 2012-03-31 MED ORDER — METRONIDAZOLE 1 % EX GEL: Freq: Every day | CUTANEOUS | Status: DC

## 2012-03-31 NOTE — Telephone Encounter (Signed)
Message copied by Jennette Bill on Mon Mar 31, 2012 11:13 AM ------      Message from: Everrett Coombe      Created: Fri Mar 28, 2012  4:36 PM       Patient with BV, will send in course of flagyl.  Please let her know. ------

## 2012-03-31 NOTE — Telephone Encounter (Signed)
Called patient, the flagyl is causing nausea. Informed her that Dr Ashley Royalty sent in a different Rx.Trustin Chapa, Rodena Medin

## 2012-03-31 NOTE — Telephone Encounter (Signed)
Will change to metrogel, rx sent in.

## 2012-04-03 NOTE — Progress Notes (Addendum)
Chart reviewed and I agree with Dr Ashley Royalty' assess/plan with following addition. Patient with obesity and most recent two pregnancies ended as SABs.  Would recommend early 1hrGTT.

## 2012-04-03 NOTE — Progress Notes (Signed)
Need to clarify gest age at the time of the 2 SABs.  If later than first trimester, then to refer to HROB. JB

## 2012-04-15 ENCOUNTER — Encounter: Payer: Self-pay | Admitting: Family Medicine

## 2012-04-15 ENCOUNTER — Ambulatory Visit (INDEPENDENT_AMBULATORY_CARE_PROVIDER_SITE_OTHER): Payer: Medicare Other | Admitting: Family Medicine

## 2012-04-15 VITALS — BP 119/60 | HR 96 | Temp 99.5°F | Ht 66.0 in | Wt 254.0 lb

## 2012-04-15 DIAGNOSIS — N390 Urinary tract infection, site not specified: Secondary | ICD-10-CM | POA: Diagnosis not present

## 2012-04-15 DIAGNOSIS — R300 Dysuria: Secondary | ICD-10-CM

## 2012-04-15 LAB — POCT URINALYSIS DIPSTICK
Bilirubin, UA: NEGATIVE
Glucose, UA: NEGATIVE
Nitrite, UA: POSITIVE
Urobilinogen, UA: 0.2

## 2012-04-15 LAB — POCT UA - MICROSCOPIC ONLY: RBC, urine, microscopic: >20

## 2012-04-15 MED ORDER — NITROFURANTOIN MONOHYD MACRO 100 MG PO CAPS: 100.0000 mg | ORAL_CAPSULE | Freq: Two times a day (BID) | ORAL | Status: DC

## 2012-04-15 NOTE — Patient Instructions (Addendum)
Urinary Tract Infection in Pregnancy  A urinary tract infection (UTI) is a bacterial infection of the urinary tract. Infection of the urinary tract can include the ureters, kidneys (pyelonephritis), bladder (cystitis), and urethra (urethritis). All pregnant women should be screened for bacteria in the urinary tract. Identifying and treating a UTI will decrease the risk of preterm labor and developing more serious infections in both the mother and baby.  CAUSES  Bacteria germs cause almost all UTIs. There are many factors that can increase your chances of getting a UTI during pregnancy. These include:   Having a short urethra.   Poor toilet and hygiene habits.   Sexual intercourse.   Blockage of urine along the urinary tract.   Problems with the pelvic muscles or nerves.   Diabetes.   Obesity.   Bladder problems after having several children.   Previous history of UTI.  SYMPTOMS    Pain, burning, or a stinging feeling when urinating.   Suddenly feeling the need to urinate right away (urgency).   Loss of bladder control (urinary incontinence).   Frequent urination, more than is common with pregnancy.   Lower abdominal or back discomfort.   Bad smelling urine.   Cloudy urine.   Blood in the urine (hematuria).   Fever.  When the kidneys are infected, the symptoms may be:   Back pain.   Flank pain on the right side more so than the left.   Fever.   Chills.   Nausea.   Vomiting.  DIAGNOSIS    Urine tests.   Additional tests and procedures may include:   Ultrasound of the kidneys, ureters, bladder, and urethra.   Looking in the bladder with a lighted tube (cystoscopy).   Certain X-ray studies only when absolutely necessary.  Finding out the results of your test  Ask when your test results will be ready. Make sure you get your test results.  TREATMENT   Antibiotic medicine by mouth.   Antibiotics given through the vein (intravenously), if needed.  HOME CARE INSTRUCTIONS    Take your  antibiotics as directed. Finish them even if you start to feel better. Only take medicine as directed by your caregiver.   Drink enough fluids to keep your urine clear or pale yellow.   Do not have sexual intercourse until the infection is gone and your caregiver says it is okay.   Make sure you are tested for UTIs throughout your pregnancy if you get one. These infections often come back.  Preventing a UTI in the future:   Practice good toilet habits. Always wipe from front to back. Use the tissue only once.   Do not hold your urine. Empty your bladder as soon as possible when the urge comes.   Do not douche or use deodorant sprays.   Wash with soap and warm water around the genital area and the anus.   Empty your bladder before and after sexual intercourse.   Wear underwear with a cotton crotch.   Avoid caffeine and carbonated drinks. They can irritate the bladder.   Drink cranberry juice or take cranberry pills. This may decrease the risk of getting a UTI.   Do not drink alcohol.   Keep all your appointments and tests as scheduled.  SEEK MEDICAL CARE IF:    Your symptoms get worse.   You are still having fevers 2 or more days after treatment begins.   You develop a rash.   You feel that you are having problems with   these instructions.  Will watch your condition.  Will get help right away if you are not doing well or get worse. Document Released: 04/28/2010 Document Revised: 03/26/2011 Document Reviewed: 04/28/2010 [facility] Patient Information 2013 [place], [place]. Heartburn During Pregnancy  Heartburn happens when stomach acid goes up into the  esophagus. The esophagus is the tube between the mouth and the stomach. This acid causes a burning pain in the chest or throat. This happens more often in the later part of pregnancy because the womb (uterus) gets larger. It may also happen because of hormone changes. Heartburn problems often go away after giving birth. HOME CARE  Take all medicine as told by your doctor.  Raise the head of your bed with blocks only as told by your doctor.  Do not exercise right after eating.  Avoid eating 2 or 3 hours before bed. Do not lie down right after eating.  Eat small meals throughout the day instead of 3 large meals.  Avoid foods that give you heartburn. Foods you may want to avoid include:  Peppers.  Chocolate.  High-fat foods, including fried foods.  Spicy foods.  Garlic and onions.  Citrus fruits, including oranges, grapefruit, lemons, and limes.  Food containing tomatoes or tomato products.  Mint.  Bubbly (carbonated) drinks and drinks with caffeine.  Vinegar. GET HELP RIGHT AWAY IF:  You have bad chest pain that goes down your arm or into your jaw or neck.  You feel sweaty, dizzy, or lightheaded.  You have trouble breathing.  You throw up (vomit) blood.  You have trouble or pain when swallowing.  You have bloody or black poop (stool).  You have heartburn more than 3 times a week, for more than 2 weeks. MAKE SURE YOU:  Understand these instructions.  Will watch your condition.  Will get help right away if you are not doing well or get worse. Document Released: 02/03/2010 Document Revised: 03/26/2011 Document Reviewed: 05/21/2010 [facility] Patient Information 2013 [place], [place].

## 2012-04-16 DIAGNOSIS — N390 Urinary tract infection, site not specified: Secondary | ICD-10-CM | POA: Insufficient documentation

## 2012-04-16 NOTE — Progress Notes (Signed)
  Subjective:    Patient ID: [name], female    DOB: [birth year], 32 y.o.   MRN: [id number]  HPIWork in appt for urinary symptoms  For past day, has noted pelvic pressure sensation of incomplete voiding with urination.  Similar to a previous episode of uti.  NO burning with uriantion.  No flanks pain.  No recent abx.  No abdominal pain, vaginal bleeding or discharge.  No fever/chils.    Review of Systems See HPI    Objective:   Physical Exam  GEN: Alert & Oriented, No acute distress CV:  Regular Rate & Rhythm, no murmur Respiratory:  Normal work of breathing, CTAB Abd:  + BS, soft, no tenderness to palpation Ext: no pre-tibial edema FHT: 150;s.        Assessment & Plan:

## 2012-04-16 NOTE — Assessment & Plan Note (Signed)
macrobid prescribed.  Has first OB appt next week.  Ob culture ordered.  Discussed supportive care and red flags for return

## 2012-04-17 ENCOUNTER — Telehealth: Payer: Self-pay | Admitting: Family Medicine

## 2012-04-17 NOTE — Telephone Encounter (Signed)
Had office visit on Tuesday (4/1) and dx'd with UTI.  Is [redacted] weeks pregnant.  "Starting to notice blood in toilet and everytime I wipe after peeing, but not on underwear." Denies burning, but is having dysuria and pressure when urinating.  Taking abx BID since Tuesday.    Finished Metrogel on Monday and then started noticing pain on Tuesday.  Denies any vaginal bleeding or contractions.  Spoke with Dr. Mauricio Po (preceptor).  Patient will need office visit for re-evaluation.  Appt scheduled for tomorrow afternoon with Dr. Fara Boros at 3:00 pm.  Gaylene Brooks, RN

## 2012-04-17 NOTE — Telephone Encounter (Signed)
Pt was here the other day and is still having problems - wants to talk to nurse again

## 2012-04-18 ENCOUNTER — Encounter: Payer: Self-pay | Admitting: Family Medicine

## 2012-04-18 ENCOUNTER — Ambulatory Visit (INDEPENDENT_AMBULATORY_CARE_PROVIDER_SITE_OTHER): Payer: Medicare Other | Admitting: Family Medicine

## 2012-04-18 VITALS — BP 120/69 | HR 83 | Temp 99.4°F | Wt 255.9 lb

## 2012-04-18 DIAGNOSIS — R300 Dysuria: Secondary | ICD-10-CM | POA: Diagnosis not present

## 2012-04-18 DIAGNOSIS — N760 Acute vaginitis: Secondary | ICD-10-CM | POA: Diagnosis not present

## 2012-04-18 DIAGNOSIS — Z3480 Encounter for supervision of other normal pregnancy, unspecified trimester: Secondary | ICD-10-CM

## 2012-04-18 DIAGNOSIS — N390 Urinary tract infection, site not specified: Secondary | ICD-10-CM

## 2012-04-18 DIAGNOSIS — N898 Other specified noninflammatory disorders of vagina: Secondary | ICD-10-CM | POA: Diagnosis not present

## 2012-04-18 DIAGNOSIS — Z3481 Encounter for supervision of other normal pregnancy, first trimester: Secondary | ICD-10-CM

## 2012-04-18 LAB — POCT URINALYSIS DIPSTICK
Nitrite, UA: NEGATIVE
Urobilinogen, UA: 0.2
pH, UA: 6

## 2012-04-18 LAB — CULTURE, OB URINE: Colony Count: >=100000

## 2012-04-18 LAB — POCT WET PREP (WET MOUNT): Clue Cells Wet Prep Whiff POC: NEGATIVE

## 2012-04-18 MED ORDER — NITROFURANTOIN MONOHYD MACRO 100 MG PO CAPS: 100.0000 mg | ORAL_CAPSULE | Freq: Two times a day (BID) | ORAL | Status: DC

## 2012-04-18 NOTE — Assessment & Plan Note (Signed)
Given reports of abdominal pain and reports of vaginal bleeding, pelvic exam was performed.  Cervical os was closed with no evidence of bleeding. Unable to use find fetal heart tones with hand held doppler.  [place] was then performed to confirm fetal heart rate and viability.  Fetus was moving well with normal heart rate. Patient to follow up next week for OB visit.

## 2012-04-18 NOTE — Patient Instructions (Addendum)
Continue taking your antibiotic. I have given you an additional 2 days.  Please follow up on Wed or Monday if you are not improving.  If you worsen over the weekend please go to [facility] hospital.

## 2012-04-18 NOTE — Progress Notes (Signed)
Subjective:     Patient ID: [name], female   DOB: Jul 02, 1980, 32 y.o.   MRN: [id number]  HPI 32 year old [id number] presents for follow up to day regarding recent UTI (diagnosed 03/15/12).  Patient is currently [redacted]w[redacted]d.  1) UTI - Patient reports continued dysuria and suprapubic tenderness.  Also reports blood in urine. - Suprapubic area quite painful - severe at times. - Patient currently on day 4 of treatment with Macrobid (culture was performed and confirmed sensitivity) - Given prior SAB's x 2 and blood in urine, she was concerned and presented for further evaluation today. - ROS: Denies fevers, chills, flank pain. No nausea, vomiting. Denies vaginal bleeding but is not quite sure since blood was noted when wiping.   Social Hx - Former smoker.  Review of Systems See HPI.    Objective:   Physical Exam General: well appearing female in NAD. Abdomen: obese. Suprapubic tenderness noted.  Nondistended. Pelvic Exam:        External: normal female genitalia without lesions or masses        Vagina: normal without lesions or masses.  Moderate amount of thick white discharge noted. No  Blood noted in the vaginal vault.        Cervix: normal. Cervical os closed.  No evidence of bleeding.        Samples for Wet prep obtained.      Assessment:         Plan:

## 2012-04-18 NOTE — Assessment & Plan Note (Signed)
Repeat UA done and revealed large blood.  Symptoms consistent with UTI.  Patient reassured. Additional 2 days of Macrobid sent to pharmacy.   Patient to complete 7 day total course of antibiotics.   Patient to follow up next week (wed) or sooner if needed.  Patient instructed to go to [facility] if she worsens over the weekend.

## 2012-04-18 NOTE — Assessment & Plan Note (Signed)
Thick white vaginal discharge noted on speculum exam. Wet prep obtained - no evidence of yeast, BV, or Trich.

## 2012-04-23 ENCOUNTER — Encounter: Payer: Self-pay | Admitting: Family Medicine

## 2012-04-23 ENCOUNTER — Ambulatory Visit: Payer: Medicare Other | Admitting: Family Medicine

## 2012-04-23 VITALS — BP 110/74 | Wt 253.0 lb

## 2012-04-23 DIAGNOSIS — Z3481 Encounter for supervision of other normal pregnancy, first trimester: Secondary | ICD-10-CM

## 2012-04-23 NOTE — Patient Instructions (Addendum)
Thank you for coming in today, it was good to see you Make an appointment with our lab for a 1 hour glucose test within the next few days Follow up with me or Dr. Clinton Sawyer in the next 4 weeks You can try vitamin b6 (pyridoxine) for 50-100mg  two times per day Please call or go to [facility] hospital for bleeding or cramping.

## 2012-04-24 ENCOUNTER — Other Ambulatory Visit (INDEPENDENT_AMBULATORY_CARE_PROVIDER_SITE_OTHER): Payer: Medicare Other

## 2012-04-24 ENCOUNTER — Other Ambulatory Visit: Payer: Self-pay | Admitting: Family Medicine

## 2012-04-24 DIAGNOSIS — Z3480 Encounter for supervision of other normal pregnancy, unspecified trimester: Secondary | ICD-10-CM | POA: Diagnosis not present

## 2012-04-24 DIAGNOSIS — Z331 Pregnant state, incidental: Secondary | ICD-10-CM

## 2012-04-24 NOTE — Progress Notes (Signed)
1 HR [place] Glucose = 138 mg/dL   Pt scheduled for 3 hr GTT on 04-26-12 to be done at the [facility] drawing lab.  Dewitt Hoes, MLS

## 2012-04-24 NOTE — Progress Notes (Signed)
1 HR GTT DONE TODAY [name] 

## 2012-04-26 ENCOUNTER — Other Ambulatory Visit: Payer: Self-pay | Admitting: Family Medicine

## 2012-04-26 DIAGNOSIS — Z331 Pregnant state, incidental: Secondary | ICD-10-CM | POA: Diagnosis not present

## 2012-04-26 LAB — GLUCOSE TOLERANCE, 3 HOURS
Glucose Tolerance, 2 hour: 132 mg/dL (ref 70–164)
Glucose, GTT - 3 Hour: 64 mg/dL — ABNORMAL LOW (ref 70–144)

## 2012-04-27 ENCOUNTER — Other Ambulatory Visit: Payer: Self-pay | Admitting: Family Medicine

## 2012-04-27 NOTE — Progress Notes (Signed)
Here for routine prenatal followup.  Currently [redacted]w[redacted]d by LMP, confirmed by 1st trimester [place].  Reports mild nausea.  Denies vaginal bleeding, vaginal discharge, cramping or pain.  She does have a history of severe asthma, but has not had to use her albuterol that often lately.  She recently completed a course of macrobid for UTI.   O: Unable to hear heart tones with doppler, bedside US confirms presence of heartbeat, appears normal  A/P Routine prenatal care Will order 1 hr glucola given obesity/ethnicity Discussed bleeding/pain when to go to MAU Follow up in 4 weeks

## 2012-05-14 DIAGNOSIS — Z331 Pregnant state, incidental: Secondary | ICD-10-CM | POA: Diagnosis not present

## 2012-05-14 DIAGNOSIS — N390 Urinary tract infection, site not specified: Secondary | ICD-10-CM | POA: Diagnosis not present

## 2012-06-16 DIAGNOSIS — Z1389 Encounter for screening for other disorder: Secondary | ICD-10-CM | POA: Diagnosis not present

## 2012-07-09 ENCOUNTER — Encounter: Payer: Self-pay | Admitting: Family Medicine

## 2012-07-09 ENCOUNTER — Ambulatory Visit (INDEPENDENT_AMBULATORY_CARE_PROVIDER_SITE_OTHER): Payer: Medicare Other | Admitting: Family Medicine

## 2012-07-09 VITALS — BP 123/82 | HR 85 | Ht 66.0 in | Wt 266.0 lb

## 2012-07-09 DIAGNOSIS — Z3480 Encounter for supervision of other normal pregnancy, unspecified trimester: Secondary | ICD-10-CM | POA: Diagnosis not present

## 2012-07-09 DIAGNOSIS — M722 Plantar fascial fibromatosis: Secondary | ICD-10-CM

## 2012-07-09 DIAGNOSIS — M2140 Flat foot [pes planus] (acquired), unspecified foot: Secondary | ICD-10-CM

## 2012-07-09 DIAGNOSIS — Z3482 Encounter for supervision of other normal pregnancy, second trimester: Secondary | ICD-10-CM

## 2012-07-09 NOTE — Patient Instructions (Addendum)
Thank you for coming in today, it was good to see you I am going to refer your sports medicine for orthotics I am glad your pregnancy is going well.   Plantar Fasciitis Plantar fasciitis is a common condition that causes foot pain. It is soreness (inflammation) of the band of tough fibrous tissue on the bottom of the foot that runs from the heel bone (calcaneus) to the ball of the foot. The cause of this soreness may be from excessive standing, poor fitting shoes, running on hard surfaces, being overweight, having an abnormal walk, or overuse (this is common in runners) of the painful foot or feet. It is also common in aerobic exercise dancers and ballet dancers. SYMPTOMS  Most people with plantar fasciitis complain of:  Severe pain in the morning on the bottom of their foot especially when taking the first steps out of bed. This pain recedes after a few minutes of walking.  Severe pain is experienced also during walking following a long period of inactivity.  Pain is worse when walking barefoot or up stairs DIAGNOSIS   Your caregiver will diagnose this condition by examining and feeling your foot.  Special tests such as X-rays of your foot, are usually not needed. PREVENTION   Consult a sports medicine professional before beginning a new exercise program.  Walking programs offer a good workout. With walking there is a lower chance of overuse injuries common to runners. There is less impact and less jarring of the joints.  Begin all new exercise programs slowly. If problems or pain develop, decrease the amount of time or distance until you are at a comfortable level.  Wear good shoes and replace them regularly.  Stretch your foot and the heel cords at the back of the ankle (Achilles tendon) both before and after exercise.  Run or exercise on even surfaces that are not hard. For example, asphalt is better than pavement.  Do not run barefoot on hard surfaces.  If using a treadmill,  vary the incline.  Do not continue to workout if you have foot or joint problems. Seek professional help if they do not improve. HOME CARE INSTRUCTIONS   Avoid activities that cause you pain until you recover.  Use ice or cold packs on the problem or painful areas after working out.  Only take over-the-counter or prescription medicines for pain, discomfort, or fever as directed by your caregiver.  Soft shoe inserts or athletic shoes with air or gel sole cushions may be helpful.  If problems continue or become more severe, consult a sports medicine caregiver or your own health care provider. Cortisone is a potent anti-inflammatory medication that may be injected into the painful area. You can discuss this treatment with your caregiver. MAKE SURE YOU:   Understand these instructions.  Will watch your condition.  Will get help right away if you are not doing well or get worse. Document Released: 09/26/2000 Document Revised: 03/26/2011 Document Reviewed: 11/26/2007 [facility] Patient Information 2014 [place], [place].

## 2012-07-10 DIAGNOSIS — M722 Plantar fascial fibromatosis: Secondary | ICD-10-CM | POA: Insufficient documentation

## 2012-07-10 DIAGNOSIS — M2140 Flat foot [pes planus] (acquired), unspecified foot: Secondary | ICD-10-CM | POA: Insufficient documentation

## 2012-07-10 NOTE — Assessment & Plan Note (Signed)
Being followed by Dr. Stefano Gaul.

## 2012-07-10 NOTE — Assessment & Plan Note (Signed)
Refer to sports medicine to be fitted for orthotics.

## 2012-07-10 NOTE — Assessment & Plan Note (Signed)
Pes planus, pregnancy and obesity likely all contributing.  Discussed stretches and icing daily.  Referral to [facility] for orthotics.

## 2012-07-10 NOTE — Progress Notes (Signed)
  Subjective:    Patient ID: [name], female    DOB: Jul 16, 1980, 32 y.o.   MRN: [id number]  HPI  1. Foot pain:  Patient with c/o bilateral foot pain for the past few weeks.  Pain is along plantar aspect of foot just distal to the calcaneal area.  She reports that she has pain most of the time but it is worse when she first wakes up and starts walking.  She has tried OTC shoe inserts. Patient is currently pregnant and has noticed that this seems to be contributing.  She denies any swelling in her feet or trauma.    Review of Systems Per HPI    Objective:   Physical Exam  Constitutional:  Obese female, nad   Musculoskeletal:  No calf tenderness or edema.  She has bilateral pes planus with mild loss of transverse arch as well.  She has tenderness along arch and area of plantar fascia insertion.            Assessment & Plan:

## 2012-07-30 ENCOUNTER — Ambulatory Visit (INDEPENDENT_AMBULATORY_CARE_PROVIDER_SITE_OTHER): Payer: Medicare Other | Admitting: Sports Medicine

## 2012-07-30 ENCOUNTER — Ambulatory Visit: Payer: Medicaid Other | Admitting: Sports Medicine

## 2012-07-30 VITALS — BP 119/77 | Ht 66.0 in | Wt 264.0 lb

## 2012-07-30 DIAGNOSIS — M722 Plantar fascial fibromatosis: Secondary | ICD-10-CM

## 2012-07-30 NOTE — Progress Notes (Signed)
  Subjective:    Patient ID: [name], female    DOB: [birth year], 32 y.o.   MRN: [id number]  HPI chief complaint: Bilateral foot pain  Very pleasant 32 year old female comes in today complaining of 4 months of bilateral foot pain. No trauma. Gradual onset of pain that begins in each heel and radiates into the arch of her foot. Worse with prolonged standing as well as with first step in the morning. She saw her primary care physician who referred her to [place] for the possibility of shoe inserts. She is 7 months pregnant and has noticed her pain increasing as she is gaining weight from her pregnancy. She describes an aching discomfort along the bottom of each foot. No numbness or tingling. No significant problems with her feet or ankles in the past. Symptoms improve at rest.  Past medical history and current medications are reviewed Allergies are reviewed    Review of Systems as above     Objective:   Physical Exam Overweight, no acute distress. Awake alert and oriented x3  Examination of each foot shows tenderness to palpation at the calcaneal insertion of the plantar fascia. No pain with calcaneal squeeze. No soft tissue swelling. She has moderate pes planus with standing. Neurovascularly intact distally. Walking without significant limp.       Assessment & Plan:  1. Bilateral heel pain secondary to plantar fasciitis 2. 7 months pregnant 3. Obesity 4. Pes planus  Plantar fascial stretches and daily icing. Green sports insoles with scaphoid pads to help with her pes planus. She may benefit from custom orthotics down the road. I think her symptoms will improve postpartum but I also discussed with her the importance of weight loss with this condition. She will followup postpartum if symptoms persist.

## 2012-08-04 DIAGNOSIS — Z331 Pregnant state, incidental: Secondary | ICD-10-CM | POA: Diagnosis not present

## 2012-08-07 ENCOUNTER — Other Ambulatory Visit: Payer: Self-pay | Admitting: Family Medicine

## 2012-08-08 ENCOUNTER — Other Ambulatory Visit: Payer: Self-pay | Admitting: Family Medicine

## 2012-08-08 ENCOUNTER — Telehealth: Payer: Self-pay | Admitting: Family Medicine

## 2012-08-08 NOTE — Telephone Encounter (Signed)
appt made for 08/27/2012.  Krisanne Lich, Darlyne Russian, CMA

## 2012-08-08 NOTE — Telephone Encounter (Signed)
To [facility] red team - please call pt and let her know I have refilled one month's worth of her singulair but she needs to schedule an appointment to follow up on her asthma within the next 1-2 months, so she can meet her new PCP. Thanks!  [name], MD

## 2012-08-21 DIAGNOSIS — O99810 Abnormal glucose complicating pregnancy: Secondary | ICD-10-CM | POA: Diagnosis not present

## 2012-08-27 ENCOUNTER — Encounter: Payer: Self-pay | Admitting: Family Medicine

## 2012-08-27 ENCOUNTER — Ambulatory Visit (INDEPENDENT_AMBULATORY_CARE_PROVIDER_SITE_OTHER): Payer: Medicare Other | Admitting: Family Medicine

## 2012-08-27 VITALS — BP 109/71 | HR 90 | Ht 66.0 in | Wt 262.0 lb

## 2012-08-27 DIAGNOSIS — K219 Gastro-esophageal reflux disease without esophagitis: Secondary | ICD-10-CM | POA: Diagnosis not present

## 2012-08-27 DIAGNOSIS — J45909 Unspecified asthma, uncomplicated: Secondary | ICD-10-CM | POA: Diagnosis not present

## 2012-08-27 MED ORDER — FAMOTIDINE 20 MG PO TABS: 20.0000 mg | ORAL_TABLET | Freq: Two times a day (BID) | ORAL | Status: DC | PRN

## 2012-08-27 NOTE — Assessment & Plan Note (Signed)
Well controlled, using albuterol 1-2 times per week. Continue current management. Instructed patient to schedule f/u visit with Dr. Sherene Sires (pulmonologist) some time after the baby is born.

## 2012-08-27 NOTE — Assessment & Plan Note (Signed)
Okay to take pepcid during pregnancy, it's category B. Refills ordered. Pt discussed with Dr. Mauricio Po who agrees with this plan.

## 2012-08-27 NOTE — Progress Notes (Signed)
Patient ID: [name], female   DOB: 07-29-1980, 32 y.o.   MRN: [id number]    HPI:  Patient presents today to meet new PCP and discuss asthma. She is currently pregnant and going to [facility] for her prenatal care. Reports no problems thus far during the pregnancy. She is almost [redacted] weeks gestation.  Asthma: Currently using albuterol, symbicort 160-4.5 2 puff BID, and singulair 10mg  daily. Pt reports asthma is well controlled. Does get SOB at night sometimes and uses albuterol about 1-2 x/week, with good relief. Has seen pulmonology in the past previously (over 1 year ago) and was told to stop using her spacer at that visit.   Heartburn: Gets heartburn about every other day. Pepcid helped when she took it. States that someone here at [facility] told ehr not to take pepcid during pregnancy, but that her OB/GYN said it's fine for her to take while pregnant. Requests refill.  ROS: See HPI  PMFSH: hx asthma  PHYSICAL EXAM: BP 109/71  Pulse 90  Ht 5\' 6"  (1.676 m)  Wt 262 lb (118.842 kg)  BMI 42.31 kg/m2  LMP 01/24/2012 Gen: NAD Heart: RRR Lungs: CTAB. No wheezes, rales, or rhonchi present. Normal respiratory effort. Abd: gravid but otherwise soft, nontender, nondistended. FHR obtained at 137bmp. Neuro: grossly nonfocal, speech intact  ASSESSMENT/PLAN:  # see problem based charting -f/u in 6 months for general medical care

## 2012-08-27 NOTE — Patient Instructions (Addendum)
It was nice to meet you today!  The asthma seems well controlled. Let's continue the medicines as they are. Schedule an appointment to see Dr. Sherene Sires sometime after you have the baby.  I sent in a refill on pepcid. Call if you have any problems. Come back to see me in 6 months.  Congratulations on the expected little one!  Be well, Dr. Pollie Meyer

## 2012-09-30 DIAGNOSIS — Z331 Pregnant state, incidental: Secondary | ICD-10-CM | POA: Diagnosis not present

## 2012-09-30 DIAGNOSIS — O26849 Uterine size-date discrepancy, unspecified trimester: Secondary | ICD-10-CM | POA: Diagnosis not present

## 2012-09-30 LAB — OB RESULTS CONSOLE GBS: GBS: NEGATIVE

## 2012-10-23 DIAGNOSIS — R81 Glycosuria: Secondary | ICD-10-CM | POA: Diagnosis not present

## 2012-10-23 DIAGNOSIS — Z331 Pregnant state, incidental: Secondary | ICD-10-CM | POA: Diagnosis not present

## 2012-10-23 DIAGNOSIS — O26849 Uterine size-date discrepancy, unspecified trimester: Secondary | ICD-10-CM | POA: Diagnosis not present

## 2012-10-23 DIAGNOSIS — R82998 Other abnormal findings in urine: Secondary | ICD-10-CM | POA: Diagnosis not present

## 2012-10-25 ENCOUNTER — Encounter (HOSPITAL_COMMUNITY): Payer: Self-pay | Admitting: Obstetrics and Gynecology

## 2012-10-25 ENCOUNTER — Encounter (HOSPITAL_COMMUNITY): Payer: Medicare Other | Admitting: Anesthesiology

## 2012-10-25 ENCOUNTER — Encounter (HOSPITAL_COMMUNITY)
Admission: AD | Disposition: A | Discharge status: Home / Self Care | Payer: Self-pay | Source: Ambulatory Visit | Attending: Obstetrics and Gynecology

## 2012-10-25 ENCOUNTER — Inpatient Hospital Stay (HOSPITAL_COMMUNITY)
Admission: AD | Admit: 2012-10-25 | Discharge: 2012-10-28 | DRG: 766 | Disposition: A | Discharge status: Home / Self Care | Payer: Medicare Other | Source: Ambulatory Visit | Attending: Obstetrics and Gynecology | Admitting: Obstetrics and Gynecology

## 2012-10-25 ENCOUNTER — Inpatient Hospital Stay (HOSPITAL_COMMUNITY): Payer: Medicare Other | Admitting: Anesthesiology

## 2012-10-25 DIAGNOSIS — E669 Obesity, unspecified: Secondary | ICD-10-CM

## 2012-10-25 DIAGNOSIS — Z3483 Encounter for supervision of other normal pregnancy, third trimester: Secondary | ICD-10-CM

## 2012-10-25 DIAGNOSIS — N898 Other specified noninflammatory disorders of vagina: Secondary | ICD-10-CM

## 2012-10-25 DIAGNOSIS — K219 Gastro-esophageal reflux disease without esophagitis: Secondary | ICD-10-CM

## 2012-10-25 DIAGNOSIS — K5909 Other constipation: Secondary | ICD-10-CM

## 2012-10-25 DIAGNOSIS — Z87891 Personal history of nicotine dependence: Secondary | ICD-10-CM

## 2012-10-25 DIAGNOSIS — J309 Allergic rhinitis, unspecified: Secondary | ICD-10-CM

## 2012-10-25 DIAGNOSIS — Z98891 History of uterine scar from previous surgery: Secondary | ICD-10-CM

## 2012-10-25 DIAGNOSIS — O479 False labor, unspecified: Secondary | ICD-10-CM | POA: Diagnosis not present

## 2012-10-25 DIAGNOSIS — O34219 Maternal care for unspecified type scar from previous cesarean delivery: Secondary | ICD-10-CM | POA: Diagnosis not present

## 2012-10-25 DIAGNOSIS — G43009 Migraine without aura, not intractable, without status migrainosus: Secondary | ICD-10-CM

## 2012-10-25 DIAGNOSIS — N390 Urinary tract infection, site not specified: Secondary | ICD-10-CM

## 2012-10-25 DIAGNOSIS — F1210 Cannabis abuse, uncomplicated: Secondary | ICD-10-CM

## 2012-10-25 DIAGNOSIS — M722 Plantar fascial fibromatosis: Secondary | ICD-10-CM

## 2012-10-25 DIAGNOSIS — J45909 Unspecified asthma, uncomplicated: Secondary | ICD-10-CM

## 2012-10-25 DIAGNOSIS — Z3201 Encounter for pregnancy test, result positive: Secondary | ICD-10-CM

## 2012-10-25 LAB — ABO/RH: ABO/RH(D): O POS

## 2012-10-25 LAB — TYPE AND SCREEN: Antibody Screen: NEGATIVE

## 2012-10-25 LAB — CBC
HCT: 34.2 % — ABNORMAL LOW (ref 36.0–46.0)
Hemoglobin: 11.2 g/dL — ABNORMAL LOW (ref 12.0–15.0)
MCH: 25.5 pg — ABNORMAL LOW (ref 26.0–34.0)
MCV: 77.9 fL — ABNORMAL LOW (ref 78.0–100.0)
Platelets: 274 10*3/uL (ref 150–400)
RBC: 4.39 MIL/uL (ref 3.87–5.11)
WBC: 11.7 10*3/uL — ABNORMAL HIGH (ref 4.0–10.5)

## 2012-10-25 SURGERY — Surgical Case
Anesthesia: Spinal | Site: Abdomen | Wound class: Clean Contaminated

## 2012-10-25 MED ORDER — METOCLOPRAMIDE HCL 5 MG/ML IJ SOLN: 10.0000 mg | Freq: Three times a day (TID) | INTRAMUSCULAR | Status: DC | PRN

## 2012-10-25 MED ORDER — INFLUENZA VAC SPLIT QUAD 0.5 ML IM SUSP: 0.5000 mL | INTRAMUSCULAR | Status: AC

## 2012-10-25 MED ORDER — OXYTOCIN 40 UNITS IN LACTATED RINGERS INFUSION - SIMPLE MED: 62.5000 mL/h | INTRAVENOUS | Status: AC

## 2012-10-25 MED ORDER — PRENATAL MULTIVITAMIN CH
1.0000 | ORAL_TABLET | Freq: Every day | ORAL | Status: DC
  Administered 2012-10-26 – 2012-10-27 (×2): 1 via ORAL
  Filled 2012-10-25 (×2): qty 1

## 2012-10-25 MED ORDER — LACTATED RINGERS IV SOLN
INTRAVENOUS | Status: DC
  Administered 2012-10-25: 10:00:00 via INTRAVENOUS

## 2012-10-25 MED ORDER — MORPHINE SULFATE (PF) 0.5 MG/ML IJ SOLN
INTRAMUSCULAR | Status: DC | PRN
  Administered 2012-10-25: .1 mg via INTRATHECAL

## 2012-10-25 MED ORDER — ONDANSETRON HCL 4 MG/2ML IJ SOLN
INTRAMUSCULAR | Status: DC | PRN
  Administered 2012-10-25: 4 mg via INTRAMUSCULAR

## 2012-10-25 MED ORDER — ONDANSETRON HCL 4 MG/2ML IJ SOLN: 4.0000 mg | Freq: Three times a day (TID) | INTRAMUSCULAR | Status: DC | PRN

## 2012-10-25 MED ORDER — NALBUPHINE HCL 10 MG/ML IJ SOLN
5.0000 mg | INTRAMUSCULAR | Status: DC | PRN
  Filled 2012-10-25: qty 1

## 2012-10-25 MED ORDER — FENTANYL CITRATE 0.05 MG/ML IJ SOLN
INTRAMUSCULAR | Status: DC | PRN
  Administered 2012-10-25: 15 ug via INTRATHECAL

## 2012-10-25 MED ORDER — IBUPROFEN 600 MG PO TABS
600.0000 mg | ORAL_TABLET | Freq: Four times a day (QID) | ORAL | Status: DC
  Administered 2012-10-25 – 2012-10-28 (×10): 600 mg via ORAL
  Filled 2012-10-25 (×10): qty 1

## 2012-10-25 MED ORDER — NALOXONE HCL 1 MG/ML IJ SOLN
1.0000 ug/kg/h | INTRAVENOUS | Status: DC | PRN
  Filled 2012-10-25: qty 2

## 2012-10-25 MED ORDER — SCOPOLAMINE 1 MG/3DAYS TD PT72
MEDICATED_PATCH | TRANSDERMAL | Status: AC
  Filled 2012-10-25: qty 1

## 2012-10-25 MED ORDER — SIMETHICONE 80 MG PO CHEW
80.0000 mg | CHEWABLE_TABLET | Freq: Three times a day (TID) | ORAL | Status: DC
  Administered 2012-10-25 – 2012-10-28 (×10): 80 mg via ORAL
  Filled 2012-10-25 (×8): qty 1

## 2012-10-25 MED ORDER — FERROUS SULFATE 325 (65 FE) MG PO TABS
325.0000 mg | ORAL_TABLET | Freq: Two times a day (BID) | ORAL | Status: DC
  Administered 2012-10-26 – 2012-10-28 (×5): 325 mg via ORAL
  Filled 2012-10-25 (×5): qty 1

## 2012-10-25 MED ORDER — NALOXONE HCL 0.4 MG/ML IJ SOLN: 0.4000 mg | INTRAMUSCULAR | Status: DC | PRN

## 2012-10-25 MED ORDER — SCOPOLAMINE 1 MG/3DAYS TD PT72
1.0000 | MEDICATED_PATCH | Freq: Once | TRANSDERMAL | Status: AC
  Administered 2012-10-25: 1.5 mg via TRANSDERMAL

## 2012-10-25 MED ORDER — MENTHOL 3 MG MT LOZG: 1.0000 | LOZENGE | OROMUCOSAL | Status: DC | PRN

## 2012-10-25 MED ORDER — BUPIVACAINE IN DEXTROSE 0.75-8.25 % IT SOLN
INTRATHECAL | Status: DC | PRN
  Administered 2012-10-25: 11.75 mg via INTRATHECAL

## 2012-10-25 MED ORDER — MONTELUKAST SODIUM 10 MG PO TABS
10.0000 mg | ORAL_TABLET | Freq: Every day | ORAL | Status: DC
  Administered 2012-10-25 – 2012-10-27 (×3): 10 mg via ORAL
  Filled 2012-10-25 (×3): qty 1

## 2012-10-25 MED ORDER — FAMOTIDINE 20 MG PO TABS: 20.0000 mg | ORAL_TABLET | Freq: Two times a day (BID) | ORAL | Status: DC | PRN

## 2012-10-25 MED ORDER — DEXTROSE 5 % IV SOLN
3.0000 g | INTRAVENOUS | Status: AC
  Administered 2012-10-25: 3 g via INTRAVENOUS
  Filled 2012-10-25: qty 3000

## 2012-10-25 MED ORDER — SODIUM CHLORIDE 0.9 % IJ SOLN: 3.0000 mL | INTRAMUSCULAR | Status: DC | PRN

## 2012-10-25 MED ORDER — BUDESONIDE-FORMOTEROL FUMARATE 160-4.5 MCG/ACT IN AERO
2.0000 | INHALATION_SPRAY | Freq: Two times a day (BID) | RESPIRATORY_TRACT | Status: DC
  Administered 2012-10-25 – 2012-10-27 (×6): 2 via RESPIRATORY_TRACT
  Filled 2012-10-25: qty 6

## 2012-10-25 MED ORDER — DIPHENHYDRAMINE HCL 50 MG/ML IJ SOLN: 12.5000 mg | INTRAMUSCULAR | Status: DC | PRN

## 2012-10-25 MED ORDER — TETANUS-DIPHTH-ACELL PERTUSSIS 5-2.5-18.5 LF-MCG/0.5 IM SUSP: 0.5000 mL | Freq: Once | INTRAMUSCULAR | Status: DC

## 2012-10-25 MED ORDER — METHYLERGONOVINE MALEATE 0.2 MG PO TABS: 0.2000 mg | ORAL_TABLET | ORAL | Status: DC | PRN

## 2012-10-25 MED ORDER — SIMETHICONE 80 MG PO CHEW
80.0000 mg | CHEWABLE_TABLET | ORAL | Status: DC | PRN
  Filled 2012-10-25: qty 1

## 2012-10-25 MED ORDER — OXYCODONE-ACETAMINOPHEN 5-325 MG PO TABS
1.0000 | ORAL_TABLET | ORAL | Status: DC | PRN
  Administered 2012-10-26 (×6): 1 via ORAL
  Administered 2012-10-27 (×2): 2 via ORAL
  Administered 2012-10-27: 1 via ORAL
  Administered 2012-10-27: 2 via ORAL
  Administered 2012-10-27: 1 via ORAL
  Administered 2012-10-27: 2 via ORAL
  Administered 2012-10-27: 1 via ORAL
  Administered 2012-10-28: 2 via ORAL
  Filled 2012-10-25: qty 2
  Filled 2012-10-25 (×2): qty 1
  Filled 2012-10-25 (×2): qty 2
  Filled 2012-10-25 (×6): qty 1
  Filled 2012-10-25 (×2): qty 2

## 2012-10-25 MED ORDER — EPHEDRINE SULFATE 50 MG/ML IJ SOLN
INTRAMUSCULAR | Status: DC | PRN
  Administered 2012-10-25 (×2): 10 mg via INTRAVENOUS
  Administered 2012-10-25: 5 mg via INTRAVENOUS

## 2012-10-25 MED ORDER — DIPHENHYDRAMINE HCL 25 MG PO CAPS
25.0000 mg | ORAL_CAPSULE | ORAL | Status: DC | PRN
  Administered 2012-10-26: 25 mg via ORAL
  Filled 2012-10-25: qty 1

## 2012-10-25 MED ORDER — MEPERIDINE HCL 25 MG/ML IJ SOLN: 6.2500 mg | INTRAMUSCULAR | Status: DC | PRN

## 2012-10-25 MED ORDER — WITCH HAZEL-GLYCERIN EX PADS: 1.0000 "application " | MEDICATED_PAD | CUTANEOUS | Status: DC | PRN

## 2012-10-25 MED ORDER — KETOROLAC TROMETHAMINE 60 MG/2ML IM SOLN
INTRAMUSCULAR | Status: AC
  Administered 2012-10-25: 60 mg via INTRAMUSCULAR
  Filled 2012-10-25: qty 2

## 2012-10-25 MED ORDER — LACTATED RINGERS IV SOLN
INTRAVENOUS | Status: DC
  Administered 2012-10-25: 22:00:00 via INTRAVENOUS

## 2012-10-25 MED ORDER — FAMOTIDINE IN NACL 20-0.9 MG/50ML-% IV SOLN
20.0000 mg | Freq: Once | INTRAVENOUS | Status: AC
  Administered 2012-10-25: 20 mg via INTRAVENOUS
  Filled 2012-10-25: qty 50

## 2012-10-25 MED ORDER — LACTATED RINGERS IV SOLN
INTRAVENOUS | Status: DC
  Administered 2012-10-25 (×3): via INTRAVENOUS

## 2012-10-25 MED ORDER — ONDANSETRON HCL 4 MG/2ML IJ SOLN: 4.0000 mg | INTRAMUSCULAR | Status: DC | PRN

## 2012-10-25 MED ORDER — BUTORPHANOL TARTRATE 1 MG/ML IJ SOLN
1.0000 mg | Freq: Once | INTRAMUSCULAR | Status: AC
  Administered 2012-10-25: 1 mg via INTRAVENOUS
  Filled 2012-10-25: qty 1

## 2012-10-25 MED ORDER — ZOLPIDEM TARTRATE 5 MG PO TABS: 5.0000 mg | ORAL_TABLET | Freq: Every evening | ORAL | Status: DC | PRN

## 2012-10-25 MED ORDER — ONDANSETRON HCL 4 MG PO TABS: 4.0000 mg | ORAL_TABLET | ORAL | Status: DC | PRN

## 2012-10-25 MED ORDER — LANOLIN HYDROUS EX OINT: 1.0000 "application " | TOPICAL_OINTMENT | CUTANEOUS | Status: DC | PRN

## 2012-10-25 MED ORDER — ALBUTEROL SULFATE HFA 108 (90 BASE) MCG/ACT IN AERS
2.0000 | INHALATION_SPRAY | RESPIRATORY_TRACT | Status: DC | PRN
  Filled 2012-10-25: qty 6.7

## 2012-10-25 MED ORDER — INFLUENZA VAC SPLIT QUAD 0.5 ML IM SUSP: 0.5000 mL | INTRAMUSCULAR | Status: DC

## 2012-10-25 MED ORDER — SIMETHICONE 80 MG PO CHEW
80.0000 mg | CHEWABLE_TABLET | ORAL | Status: DC
  Filled 2012-10-25: qty 1

## 2012-10-25 MED ORDER — DIPHENHYDRAMINE HCL 25 MG PO CAPS: 25.0000 mg | ORAL_CAPSULE | Freq: Four times a day (QID) | ORAL | Status: DC | PRN

## 2012-10-25 MED ORDER — DIPHENHYDRAMINE HCL 50 MG/ML IJ SOLN: 25.0000 mg | INTRAMUSCULAR | Status: DC | PRN

## 2012-10-25 MED ORDER — KETOROLAC TROMETHAMINE 30 MG/ML IJ SOLN: 30.0000 mg | Freq: Four times a day (QID) | INTRAMUSCULAR | Status: AC | PRN

## 2012-10-25 MED ORDER — DIBUCAINE 1 % RE OINT: 1.0000 "application " | TOPICAL_OINTMENT | RECTAL | Status: DC | PRN

## 2012-10-25 MED ORDER — CITRIC ACID-SODIUM CITRATE 334-500 MG/5ML PO SOLN
30.0000 mL | Freq: Once | ORAL | Status: AC
  Administered 2012-10-25: 30 mL via ORAL
  Filled 2012-10-25: qty 15

## 2012-10-25 MED ORDER — SENNOSIDES-DOCUSATE SODIUM 8.6-50 MG PO TABS
2.0000 | ORAL_TABLET | ORAL | Status: DC
  Administered 2012-10-25 – 2012-10-27 (×3): 2 via ORAL
  Filled 2012-10-25 (×3): qty 2

## 2012-10-25 MED ORDER — FENTANYL CITRATE 0.05 MG/ML IJ SOLN
INTRAMUSCULAR | Status: AC
  Filled 2012-10-25: qty 2

## 2012-10-25 MED ORDER — KETOROLAC TROMETHAMINE 60 MG/2ML IM SOLN
60.0000 mg | Freq: Once | INTRAMUSCULAR | Status: AC | PRN
  Administered 2012-10-25: 60 mg via INTRAMUSCULAR

## 2012-10-25 MED ORDER — ACETAMINOPHEN 160 MG/5ML PO SOLN
975.0000 mg | Freq: Four times a day (QID) | ORAL | Status: DC | PRN
  Administered 2012-10-25: 975 mg via ORAL

## 2012-10-25 MED ORDER — FENTANYL CITRATE 0.05 MG/ML IJ SOLN
25.0000 ug | INTRAMUSCULAR | Status: DC | PRN
  Administered 2012-10-25: 50 ug via INTRAVENOUS

## 2012-10-25 MED ORDER — OXYTOCIN 10 UNIT/ML IJ SOLN
40.0000 [IU] | INTRAVENOUS | Status: DC | PRN
  Administered 2012-10-25: 40 [IU] via INTRAVENOUS

## 2012-10-25 MED ORDER — MEDROXYPROGESTERONE ACETATE 150 MG/ML IM SUSP: 150.0000 mg | INTRAMUSCULAR | Status: DC | PRN

## 2012-10-25 MED ORDER — METHYLERGONOVINE MALEATE 0.2 MG/ML IJ SOLN: 0.2000 mg | INTRAMUSCULAR | Status: DC | PRN

## 2012-10-25 MED ORDER — ACETAMINOPHEN 160 MG/5ML PO SOLN
975.0000 mg | Freq: Four times a day (QID) | ORAL | Status: DC | PRN
  Filled 2012-10-25: qty 40.6

## 2012-10-25 MED ORDER — LORATADINE 10 MG PO TABS
10.0000 mg | ORAL_TABLET | Freq: Every day | ORAL | Status: DC
  Administered 2012-10-26 – 2012-10-27 (×2): 10 mg via ORAL
  Filled 2012-10-25 (×4): qty 1

## 2012-10-25 SURGICAL SUPPLY — 40 items
APL SKNCLS STERI-STRIP NONHPOA (GAUZE/BANDAGES/DRESSINGS) ×1
BARRIER ADHS 3X4 INTERCEED (GAUZE/BANDAGES/DRESSINGS) IMPLANT
BENZOIN TINCTURE PRP APPL 2/3 (GAUZE/BANDAGES/DRESSINGS) ×2 IMPLANT
BRR ADH 4X3 ABS CNTRL BYND (GAUZE/BANDAGES/DRESSINGS)
CLAMP CORD UMBIL (MISCELLANEOUS) IMPLANT
CLOTH BEACON ORANGE TIMEOUT ST (SAFETY) ×2 IMPLANT
CONTAINER PREFILL 10% NBF 15ML (MISCELLANEOUS) IMPLANT
DRAPE LG THREE QUARTER DISP (DRAPES) ×4 IMPLANT
DRSG OPSITE POSTOP 4X10 (GAUZE/BANDAGES/DRESSINGS) ×2 IMPLANT
DURAPREP 26ML APPLICATOR (WOUND CARE) ×2 IMPLANT
ELECT REM PT RETURN 9FT ADLT (ELECTROSURGICAL) ×2
ELECTRODE REM PT RTRN 9FT ADLT (ELECTROSURGICAL) ×1 IMPLANT
EXTRACTOR VACUUM M CUP 4 TUBE (SUCTIONS) IMPLANT
GLOVE BIOGEL M 6.5 STRL (GLOVE) ×4 IMPLANT
GLOVE BIOGEL PI IND STRL 6.5 (GLOVE) ×1 IMPLANT
GLOVE BIOGEL PI INDICATOR 6.5 (GLOVE) ×1
GOWN PREVENTION PLUS XLARGE (GOWN DISPOSABLE) ×6 IMPLANT
GOWN STRL REIN XL XLG (GOWN DISPOSABLE) ×4 IMPLANT
KIT ABG SYR 3ML LUER SLIP (SYRINGE) IMPLANT
NDL HYPO 25X5/8 SAFETYGLIDE (NEEDLE) IMPLANT
NEEDLE HYPO 25X5/8 SAFETYGLIDE (NEEDLE) IMPLANT
NS IRRIG 1000ML POUR BTL (IV SOLUTION) ×2 IMPLANT
PACK C SECTION WH (CUSTOM PROCEDURE TRAY) ×2 IMPLANT
PAD OB MATERNITY 4.3X12.25 (PERSONAL CARE ITEMS) ×2 IMPLANT
RTRCTR C-SECT PINK 25CM LRG (MISCELLANEOUS) IMPLANT
RTRCTR C-SECT PINK 34CM XLRG (MISCELLANEOUS) IMPLANT
STAPLER VISISTAT 35W (STAPLE) IMPLANT
STRIP CLOSURE SKIN 1/2X4 (GAUZE/BANDAGES/DRESSINGS) ×2 IMPLANT
SUT PDS AB 0 CT1 27 (SUTURE) ×4 IMPLANT
SUT PLAIN 0 NONE (SUTURE) IMPLANT
SUT VIC AB 0 CTX 36 (SUTURE) ×6
SUT VIC AB 0 CTX36XBRD ANBCTRL (SUTURE) ×3 IMPLANT
SUT VIC AB 2-0 CT1 27 (SUTURE) ×2
SUT VIC AB 2-0 CT1 TAPERPNT 27 (SUTURE) ×1 IMPLANT
SUT VIC AB 3-0 SH 27 (SUTURE)
SUT VIC AB 3-0 SH 27X BRD (SUTURE) IMPLANT
SUT VIC AB 4-0 KS 27 (SUTURE) ×2 IMPLANT
TOWEL OR 17X24 6PK STRL BLUE (TOWEL DISPOSABLE) ×2 IMPLANT
TRAY FOLEY CATH 14FR (SET/KITS/TRAYS/PACK) ×2 IMPLANT
WATER STERILE IRR 1000ML POUR (IV SOLUTION) ×2 IMPLANT

## 2012-10-25 NOTE — Progress Notes (Signed)
Subjective: Pt reports contractions more intense and more frequent.  Was not able to ambulate for any period of time.  Denies ROM or bldg.  Reports feeling fetal movement.  States she definitely does not want to proceed with TOLAC if in labor, desires repeat C/S.  Objective: Filed Vitals:   10/25/12 0622  BP: 110/59  Pulse: 79  Temp: 98.8 F (37.1 C)  Resp: 22  Height: 5\' 6"  (1.676 m)  Weight: 122.018 kg (269 lb)   FHR  Baseline: 135 bpm; variability: moderate; accels-present; decels-absent;  FHR Cat 1. UCs every 3-5 mins, moderate to palpation.  SVE: Dimple/50%/-3/vtx/anterior  A:  IUP at 39w 2d      Contractions      Prev C/S  P:  Consult with Dr. Richardson Dopp.       Will give IV hydration and pain med and continue to observe at present.       Pt to remain NPO.

## 2012-10-25 NOTE — Transfer of Care (Signed)
Immediate Anesthesia Transfer of Care Note  Patient: [name]  Procedure(s) Performed: Procedure(s): CESAREAN SECTION (N/A)  Patient Location: PACU  Anesthesia Type:Spinal  Level of Consciousness: awake and alert   Airway & Oxygen Therapy: Patient Spontanous Breathing  Post-op Assessment: Report given to PACU RN  Post vital signs: Reviewed and stable  Complications: No apparent anesthesia complications

## 2012-10-25 NOTE — MAU Note (Signed)
Contractions all day Friday but stronger since 0200. Denies bleeding or leaking fld

## 2012-10-25 NOTE — Anesthesia Preprocedure Evaluation (Signed)
Anesthesia Evaluation  Patient identified by MRN, date of birth, ID band Patient awake    Reviewed: Allergy & Precautions, H&P , NPO status , Patient's Chart, lab work & pertinent test results, reviewed documented beta blocker date and time   History of Anesthesia Complications Negative for: history of anesthetic complications  Airway Mallampati: I TM Distance: >3 FB Neck ROM: full    Dental  (+) Teeth Intact   Pulmonary asthma (chronic, uses inhaler about once every two weeks, last used 3 days ago) ,  breath sounds clear to auscultation        Cardiovascular negative cardio ROS  Rhythm:regular Rate:Normal     Neuro/Psych negative neurological ROS  negative psych ROS   GI/Hepatic Neg liver ROS, GERD- (chronic)  Medicated,  Endo/Other  Morbid obesity  Renal/GU negative Renal ROS     Musculoskeletal   Abdominal   Peds  Hematology negative hematology ROS (+)   Anesthesia Other Findings Last solid food at 9 pm, sips of clears at 9 am  Reproductive/Obstetrics (+) Pregnancy (h/o c/s x1, in labor)                           Anesthesia Physical Anesthesia Plan  ASA: III and emergent  Anesthesia Plan: Spinal   Post-op Pain Management:    Induction:   Airway Management Planned:   Additional Equipment:   Intra-op Plan:   Post-operative Plan:   Informed Consent: I have reviewed the patients History and Physical, chart, labs and discussed the procedure including the risks, benefits and alternatives for the proposed anesthesia with the patient or authorized representative who has indicated his/her understanding and acceptance.     Plan Discussed with: Surgeon and CRNA  Anesthesia Plan Comments:         Anesthesia Quick Evaluation

## 2012-10-25 NOTE — Op Note (Signed)
Cesarean Section Procedure Note  Indications: previous uterine incision  h/o cesarean section desires repeat  Pre-operative Diagnosis: 39 week 2 day pregnancy.  Post-operative Diagnosis: same  Surgeon: Jessee Avers.   Assistants: None   Anesthesia: Spinal anesthesia  ASA Class: 2   Procedure Details  Repeat low transverse cesarean section.   The patient was seen in the Holding Room. The risks, benefits, complications, treatment options, and expected outcomes were discussed with the patient.  The patient concurred with the proposed plan, giving informed consent.  The site of surgery properly noted/marked. The patient was taken to Operating Room # 9, identified as [facility] and the procedure verified as C-Section Delivery. A Time Out was held and the above information confirmed.  After induction of anesthesia, the patient was draped and prepped in the usual sterile manner. A Pfannenstiel incision was made and carried down through the subcutaneous tissue to the fascia. Fascial incision was made and extended transversely. The fascia was separated from the underlying rectus tissue superiorly and inferiorly. The peritoneum was identified and entered. Peritoneal incision was extended longitudinally. The utero-vesical peritoneal reflection was incised transversely and the bladder flap was bluntly freed from the lower uterine segment. A low transverse uterine incision was made. Delivered from cephalic presentation was a  Female with Apgar scores of 9 at one minute and 9 at five minutes. After the umbilical cord was clamped and cut cord blood was obtained for evaluation. The placenta was removed intact and appeared normal. The uterine outline, tubes and ovaries appeared normal. The uterine incision was closed with running locked sutures of o vicryl a second layer of suture was used to imbricate the incision . Hemostasis was observed. Lavage was carried out until clear.  Interseed was placed along the  uterine incision. The fascia was then reapproximated with running sutures of 0 pds . The subcutaneous layer was reapproximated with 2-0 plain gut.  The skin was reapproximated with 4-0 vicryl .  Instrument, sponge, and needle counts were correct prior the abdominal closure and at the conclusion of the case.   Findings: Female infant in cephalic presentation.. Normal fallopian tubes and ovaries.   Estimated Blood Loss:  500 ml         Drains: Foley catheter          Total IV Fluids:  Per anesthesia ml         Specimens: Placenta and to labor and delivery            Implants: None         Complications:  None; patient tolerated the procedure well.         Disposition: PACU - hemodynamically stable.         Condition: stable  Attending Attestation: I performed the procedure.

## 2012-10-25 NOTE — MAU Provider Note (Signed)
History  CSN: [id number]  Arrival date and time: 10/25/12 0605   None    Chief Complaint  Patient presents with  . Contractions   HPI Pt presents to MAU with c/o contractions with increased frequency and intensity since 0200 09/26/12.  States she had noted irreg UCs throughout the day yesterday but increased during the early AM hours.  Reports no ROM or bldg.  Reports active fetus.  Denies other c/o at present.  States she is unable to walk and talk through UCs.  States she had initially thought she wanted to attempt TOLAC however since her last ultrasound with EFW of 8# 13oz she wants to have a repeat C/S instead.  Has not sched a C/S to date.    OB History   Grav Para Term Preterm Abortions TAB SAB Ect Mult Living   6 1 1  0 4 2 2  0 0 1      Past Medical History  Diagnosis Date  . Asthma      reservations to 2-3 timmes  a year that required ED visits/hospitalizations. Never intubated  . GERD (gastroesophageal reflux disease)     Past Surgical History  Procedure Laterality Date  . Cesarean section      Family History  Problem Relation Age of Onset  . Hypertension Mother   . Asthma Father   . Emphysema Maternal Grandfather   . Allergies Maternal Grandmother   . Asthma Maternal Grandmother     History  Substance Use Topics  . Smoking status: Former Smoker -- 0.30 packs/day for .5 years    Types: Cigarettes    Quit date: 01/15/2001  . Smokeless tobacco: Never Used  . Alcohol Use: No     Comment: occasionaly    Allergies:  Allergies  Allergen Reactions  . Iohexol Hives       . Moxifloxacin Other (See Comments)    unknown    Prescriptions prior to admission  Medication Sig Dispense Refill  . albuterol (PROAIR HFA) 108 (90 BASE) MCG/ACT inhaler Inhale 2 puffs into the lungs every 4 (four) hours as needed for wheezing or shortness of breath.  1 Inhaler  1  . budesonide-formoterol (SYMBICORT) 160-4.5 MCG/ACT inhaler Inhale 2 puffs into the lungs 2 (two) times  daily.      . cetirizine (ZYRTEC ALLERGY) 10 MG tablet Take 1 tablet (10 mg total) by mouth daily.  30 tablet  11  . famotidine (PEPCID) 20 MG tablet Take 1 tablet (20 mg total) by mouth 2 (two) times daily as needed for heartburn.  60 tablet  3  . montelukast (SINGULAIR) 10 MG tablet Take 10 mg by mouth at bedtime.      . Prenatal Vit-Fe Fumarate-FA (MULTIVITAMIN-PRENATAL) 27-0.8 MG TABS Take 1 tablet by mouth daily at 12 noon.  30 each  10    Review of Systems  Constitutional: Negative.   HENT: Negative.   Eyes: Negative.   Respiratory: Negative.   Cardiovascular: Negative.   Gastrointestinal: Negative.   Genitourinary: Negative.   Musculoskeletal: Negative.   Skin: Negative.   Neurological: Negative.   Endo/Heme/Allergies: Negative.   Psychiatric/Behavioral: Negative.    Physical Exam   Blood pressure 110/59, pulse 79, temperature 98.8 F (37.1 C), resp. rate 22, height 5\' 6"  (1.676 m), weight 122.018 kg (269 lb), last menstrual period 01/24/2012.  Physical Exam  Constitutional: She is oriented to person, place, and time. She appears well-developed and well-nourished.  HENT:  Head: Normocephalic and atraumatic.  Right Ear: External ear  normal.  Left Ear: External ear normal.  Nose: Nose normal.  Eyes: Conjunctivae are normal. Pupils are equal, round, and reactive to light.  Neck: Normal range of motion. Neck supple.  Cardiovascular: Normal rate, regular rhythm and intact distal pulses.   Respiratory: Effort normal and breath sounds normal.  GI: Soft. Bowel sounds are normal. There is no tenderness.  Gravid  Genitourinary: Vagina normal and uterus normal.  Gravid uterus, non-tender  Musculoskeletal: Normal range of motion.  Neurological: She is alert and oriented to person, place, and time. She has normal reflexes.  Skin: Skin is warm and dry.  Psychiatric: She has a normal mood and affect. Her behavior is normal.   FHR baseline 140bpm; variability-moderate;  accels-present; decels-none.  FHR Cat 1.  Reactive FHR UCs every 5-6 minutes, mod to palpation.  SVE: Closed/50%/-3/vtx  MAU Course  Procedures   Assessment and Plan  IUP at 39w 2d Previous C/S Contractions  Will ambulate and re-eval SVE in 1-2hrs.   Crespin Forstrom O. 10/25/2012, 7:40 AM

## 2012-10-25 NOTE — MAU Note (Signed)
NSmith, CNM at bedside.  

## 2012-10-25 NOTE — H&P (Signed)
[name] is a 32 y.o. female presenting  At 39 wks and 2 days with contractions q 3-5 minutes. H/o cesarean section. She initially wanted TOL however has changed her mind and desires repeat cesarean section. She does not desire tubal ligation.   Maternal Medical History:  Reason for admission: Contractions.   Contractions: Onset was 3-5 hours ago.   Frequency: regular.   Perceived severity is strong.    Fetal activity: Perceived fetal activity is normal.   Last perceived fetal movement was within the past hour.    Prenatal complications: no prenatal complications   OB History   Grav Para Term Preterm Abortions TAB SAB Ect Mult Living   6 1 1  0 4 2 2  0 0 1     Past Medical History  Diagnosis Date  . Asthma      reservations to 2-3 timmes  a year that required ED visits/hospitalizations. Never intubated  . GERD (gastroesophageal reflux disease)    Past Surgical History  Procedure Laterality Date  . Cesarean section    D&C x 2 for EAB Family History: family history includes Allergies in her maternal grandmother; Asthma in her father and maternal grandmother; Emphysema in her maternal grandfather; Hypertension in her mother. Social History:  reports that she quit smoking about 11 years ago. Her smoking use included Cigarettes. She has a .15 pack-year smoking history. She has never used smokeless tobacco. She reports that she does not drink alcohol or use illicit drugs.   Prenatal Transfer Tool  Maternal Diabetes: No Genetic Screening: Declined Maternal Ultrasounds/Referrals: Normal Fetal Ultrasounds or other Referrals:  None Maternal Substance Abuse:  No Significant Maternal Medications:  Meds include: Other:  Significant Maternal Lab Results:  Lab values include: Group B Strep negative Other Comments:  None  Review of Systems  All other systems reviewed and are negative.    Dilation: Closed Effacement (%): 50 Exam by:: NSmith, CNM Blood pressure 110/59, pulse 79,  temperature 98.8 F (37.1 C), resp. rate 22, height 5\' 6"  (1.676 m), weight 122.018 kg (269 lb), last menstrual period 01/24/2012. Maternal Exam:  Uterine Assessment: Contraction strength is firm.  Contraction frequency is regular.   Abdomen: Surgical scars: low transverse.   Fetal presentation: vertex  Introitus: Normal vulva. Normal vagina.    Fetal Exam Fetal Monitor Review: Baseline rate: 120.  Variability: moderate (6-25 bpm).   Pattern: accelerations present and no decelerations.    Fetal State Assessment: Category I - tracings are normal.     Physical Exam  Constitutional: She is oriented to person, place, and time. She appears well-developed and well-nourished.  HENT:  Head: Atraumatic.  Cardiovascular: Normal rate and regular rhythm.   Respiratory: Effort normal.  GI: There is no tenderness.  Genitourinary: Vagina normal.  Musculoskeletal: Normal range of motion. She exhibits no edema.  Neurological: She is alert and oriented to person, place, and time.  Skin: Skin is warm and dry.  Psychiatric: She has a normal mood and affect.    Prenatal labs: ABO, Rh: O/POS/-- (03/05 1503) Antibody: NEG (03/05 1503) Rubella: 17.50 (03/05 1503) RPR: NON REAC (03/05 1503)  HBsAg: NEGATIVE (03/05 1503)  HIV: NON REACTIVE (03/05 1503)  GBS: Negative (09/16 0000)   Assessment/Plan: 39 wks and 2 days with h/o cesarean section with latent labor regular contractions desires repeat cesarean section. R/B/A of cesarean section were discussed with the patient including but not limited to infection bleeding damage to bowel bladder baby with need for further surgery . R/o  Transfusion HIV/Hep B&C discussed. Pt voiced understanding and desires to proceed with repeat cesarean section.    [name] J. 10/25/2012, 11:09 AM

## 2012-10-25 NOTE — Anesthesia Procedure Notes (Signed)
Spinal  Patient location during procedure: OR Start time: 10/25/2012 12:36 PM Reason for block: procedure for pain Staffing Performed by: anesthesiologist  Preanesthetic Checklist Completed: patient identified, site marked, surgical consent, pre-op evaluation, timeout performed, IV checked, risks and benefits discussed and monitors and equipment checked Spinal Block Patient position: sitting Prep: site prepped and draped and DuraPrep Patient monitoring: blood pressure, continuous pulse ox and heart rate Approach: midline Location: L3-4 Injection technique: single-shot Needle Needle type: Pencan  Needle gauge: 24 G Needle length: 9 cm Assessment Sensory level: T4 Additional Notes Clear free flow CSF on second attempt after repositioning patient.  No paresthesia.  Patient tolerated procedure well with no apparent complications.  Jasmine December, MD

## 2012-10-25 NOTE — MAU Note (Signed)
To OR via stretcher for cesarean section.

## 2012-10-25 NOTE — Anesthesia Postprocedure Evaluation (Signed)
  Anesthesia Post-op Note  Anesthesia Post Note  Patient: [name]  Procedure(s) Performed: Procedure(s) (LRB): CESAREAN SECTION (N/A)  Anesthesia type: Spinal  Patient location: PACU  Post pain: Pain level controlled  Post assessment: Post-op Vital signs reviewed  Last Vitals:  Filed Vitals:   10/25/12 1545  BP: 133/68  Pulse: 70  Temp: 37 C  Resp: 16    Post vital signs: Reviewed  Level of consciousness: awake  Complications: No apparent anesthesia complications

## 2012-10-26 LAB — CBC
HCT: 28.9 % — ABNORMAL LOW (ref 36.0–46.0)
Hemoglobin: 9.6 g/dL — ABNORMAL LOW (ref 12.0–15.0)
MCH: 25.7 pg — ABNORMAL LOW (ref 26.0–34.0)
MCHC: 33.2 g/dL (ref 30.0–36.0)
RBC: 3.73 MIL/uL — ABNORMAL LOW (ref 3.87–5.11)

## 2012-10-26 NOTE — Anesthesia Postprocedure Evaluation (Signed)
Anesthesia Post Note  Patient: [name]  Procedure(s) Performed: Procedure(s) (LRB): CESAREAN SECTION (N/A)  Anesthesia type: Spinal  Patient location: Mother/Baby  Post pain: Pain level controlled  Post assessment: Post-op Vital signs reviewed  Last Vitals:  Filed Vitals:   10/26/12 0403  BP: 107/53  Pulse: 79  Temp: 36.6 C  Resp: 18    Post vital signs: Reviewed  Level of consciousness: awake  Complications: No apparent anesthesia complications

## 2012-10-26 NOTE — Progress Notes (Signed)
Subjective: Postpartum Day 1: Cesarean Delivery Patient reports tolerating PO.   Lochia is moderate Breast feeding   Objective: Vital signs in last 24 hours: Temp:  [97.9 F (36.6 C)-98.8 F (37.1 C)] 98.5 F (36.9 C) (10/12 0800) Pulse Rate:  [59-97] 76 (10/12 0800) Resp:  [16-21] 16 (10/12 0800) BP: (97-133)/(47-78) 97/61 mmHg (10/12 0800) SpO2:  [94 %-100 %] 95 % (10/12 0800)  Physical Exam:  General: alert and cooperative Lochia: appropriate Uterine Fundus: firm Incision: healing well DVT Evaluation: No evidence of DVT seen on physical exam.   Recent Labs  10/25/12 0915 10/26/12 0609  HGB 11.2* 9.6*  HCT 34.2* 28.9*    Assessment/Plan: Status post Cesarean section. Doing well postoperatively.  Continue current care.  Dorothea Yow J. 10/26/2012, 8:56 AM

## 2012-10-27 ENCOUNTER — Encounter (HOSPITAL_COMMUNITY): Payer: Self-pay | Admitting: Obstetrics and Gynecology

## 2012-10-27 NOTE — Progress Notes (Signed)
Subjective: Postpartum Day 2: Cesarean Delivery Patient reports that pain is well-managed.Lochia normal. Ambulating, voiding, tolerating diet as ordered without difficulty. Normal flatus. Absent bowel movement.  Objective: Vital signs in last 24 hours: Temp:  [98.4 F (36.9 C)-99 F (37.2 C)] 98.6 F (37 C) (10/13 0600) Pulse Rate:  [80-87] 86 (10/13 0600) Resp:  [18-20] 18 (10/13 0600) BP: (100-116)/(60-75) 116/75 mmHg (10/13 0600) SpO2:  [96 %] 96 % (10/12 1300)  Physical Exam:  General: alert Lochia: appropriate Uterine Fundus: firm and appropriately tender Incision: healing well DVT Evaluation: No evidence of DVT seen on physical exam. Edema minimal   Recent Labs  10/25/12 0915 10/26/12 0609  HGB 11.2* 9.6*  HCT 34.2* 28.9*    Assessment/Plan: Status post Cesarean section. Doing well postoperatively.  Continue current care. Anticipate discharge tomorrow  Angellica Maddison A 10/27/2012, 10:52 AM

## 2012-10-27 NOTE — Progress Notes (Signed)
Ur chart review completed.  

## 2012-10-28 MED ORDER — IBUPROFEN 600 MG PO TABS: 600.0000 mg | ORAL_TABLET | Freq: Four times a day (QID) | ORAL | Status: DC

## 2012-10-28 MED ORDER — OXYCODONE-ACETAMINOPHEN 5-325 MG PO TABS: 1.0000 | ORAL_TABLET | Freq: Four times a day (QID) | ORAL | Status: DC | PRN

## 2012-10-28 NOTE — Discharge Summary (Signed)
Obstetric Discharge Summary Reason for Admission: onset of labor Prenatal Procedures: NST Intrapartum Procedures: cesarean: low cervical, transverse Postpartum Procedures: none Complications-Operative and Postpartum: none Hemoglobin  Date Value Range Status  10/26/2012 9.6* 12.0 - 15.0 g/dL Final     HCT  Date Value Range Status  10/26/2012 28.9* 36.0 - 46.0 % Final    Physical Exam:  General: alert and cooperative Lochia: appropriate Uterine Fundus: firm Incision: healing well, no significant drainage DVT Evaluation: Negative Homan's sign.  Hospital Course:   The patient came in labor and had a CS for 39wks contractions by  Dr Richardson Dopp MD.  Post operatively she did well.  She tolerated a regular diet and her exam is WNL and documented in the chart. .  She has recovered well and is ready for discharge.  She is breast feeding and will use  nothing for [facility].  She will decide at her 6 week visit  Discharge Diagnoses: Term Pregnancy-delivered  Discharge Information: Date: 10/28/2012 Activity: pelvic rest Diet: routine Medications: PNV, Ibuprofen and Percocet Condition: stable Instructions: refer to practice specific booklet Discharge to: home Follow-up Information   Schedule an appointment as soon as possible for a visit with [facility].   Specialty:  Obstetrics and Gynecology   Contact information:   [address] [place] [address] [phone]      Newborn Data: Live born female  Birth Weight: 7 lb 8.1 oz (3405 g) APGAR: 9,   Home with mother.  Joette Schmoker A 10/28/2012, 10:18 AM

## 2012-10-29 ENCOUNTER — Ambulatory Visit (HOSPITAL_COMMUNITY)
Admission: RE | Admit: 2012-10-29 | Discharge: 2012-10-29 | Disposition: A | Discharge status: Home / Self Care | Payer: Medicare Other | Source: Ambulatory Visit | Attending: Obstetrics and Gynecology | Admitting: Obstetrics and Gynecology

## 2012-10-29 ENCOUNTER — Other Ambulatory Visit (HOSPITAL_COMMUNITY): Payer: Self-pay | Admitting: Obstetrics and Gynecology

## 2012-10-29 ENCOUNTER — Encounter (HOSPITAL_COMMUNITY): Payer: Medicare Other

## 2012-10-29 DIAGNOSIS — M7989 Other specified soft tissue disorders: Secondary | ICD-10-CM

## 2012-10-29 DIAGNOSIS — R609 Edema, unspecified: Secondary | ICD-10-CM | POA: Diagnosis not present

## 2012-10-29 DIAGNOSIS — M79609 Pain in unspecified limb: Secondary | ICD-10-CM | POA: Insufficient documentation

## 2012-10-29 NOTE — Progress Notes (Signed)
*  Preliminary Results* Bilateral lower extremity venous duplex completed. Visualized veins of bilateral lower extremities are negative for deep vein thrombosis. There is no evidence of Baker's cyst bilaterally.  Preliminary results discussed with [facility].  10/29/2012  Gertie Fey, RVT, RDCS, RDMS

## 2012-11-30 ENCOUNTER — Other Ambulatory Visit: Payer: Self-pay | Admitting: Family Medicine

## 2012-12-01 ENCOUNTER — Telehealth: Payer: Self-pay | Admitting: Family Medicine

## 2012-12-01 NOTE — Telephone Encounter (Signed)
Please call patient and inquire about whether she is breastfeeding. I received a refill request on some of her medications, and I need to know if she is breastfeeding before I can refill these medicines.  [name], MD

## 2012-12-01 NOTE — Telephone Encounter (Signed)
Called pt. Yes, she is breast feeding. Her baby is 29 weeks old. [name], Renato Battles

## 2012-12-04 NOTE — Telephone Encounter (Signed)
Orders entered, thanks.

## 2013-05-07 ENCOUNTER — Telehealth: Payer: Self-pay | Admitting: Family Medicine

## 2013-05-07 NOTE — Telephone Encounter (Signed)
Patient has made an appt to see Dr. Pollie MeyerMcIntyre on 5/20 but will need refills on Zyrtec and Singulair before this appt. Please advise.

## 2013-05-07 NOTE — Telephone Encounter (Signed)
Attempted to send one month supply of cetrizine and singulair to pharmacy and received a warning in Epic because of recent pregnancy and the cetrizine. Will forward to PCP to review and refill.Cynthia Cheadleobert L [name]

## 2013-05-08 MED ORDER — CETIRIZINE HCL 10 MG PO TABS: 10.0000 mg | ORAL_TABLET | Freq: Every day | ORAL | Status: DC

## 2013-05-08 MED ORDER — MONTELUKAST SODIUM 10 MG PO TABS: ORAL_TABLET | ORAL | Status: DC

## 2013-05-08 NOTE — Telephone Encounter (Signed)
Cetirizine is safe in lactation (category L2). Will send in prescriptions. Latrelle DodrillBrittany J McIntyre, MD

## 2013-06-03 ENCOUNTER — Ambulatory Visit: Payer: Medicare Other | Admitting: Family Medicine

## 2013-07-24 ENCOUNTER — Ambulatory Visit (INDEPENDENT_AMBULATORY_CARE_PROVIDER_SITE_OTHER): Payer: Medicare Other | Admitting: Family Medicine

## 2013-07-24 ENCOUNTER — Other Ambulatory Visit: Payer: Self-pay | Admitting: Family Medicine

## 2013-07-24 VITALS — BP 118/74 | HR 74 | Temp 98.6°F | Ht 66.0 in | Wt 262.0 lb

## 2013-07-24 DIAGNOSIS — J45909 Unspecified asthma, uncomplicated: Secondary | ICD-10-CM | POA: Diagnosis not present

## 2013-07-24 DIAGNOSIS — M25511 Pain in right shoulder: Secondary | ICD-10-CM

## 2013-07-24 DIAGNOSIS — K219 Gastro-esophageal reflux disease without esophagitis: Secondary | ICD-10-CM | POA: Diagnosis not present

## 2013-07-24 DIAGNOSIS — M25519 Pain in unspecified shoulder: Secondary | ICD-10-CM | POA: Diagnosis not present

## 2013-07-24 MED ORDER — BUDESONIDE-FORMOTEROL FUMARATE 160-4.5 MCG/ACT IN AERO: INHALATION_SPRAY | RESPIRATORY_TRACT | Status: DC

## 2013-07-24 MED ORDER — MONTELUKAST SODIUM 10 MG PO TABS: ORAL_TABLET | ORAL | Status: DC

## 2013-07-24 MED ORDER — MELOXICAM 7.5 MG PO TABS: 7.5000 mg | ORAL_TABLET | Freq: Every day | ORAL | Status: DC

## 2013-07-24 MED ORDER — OMEPRAZOLE 20 MG PO CPDR: 20.0000 mg | DELAYED_RELEASE_CAPSULE | Freq: Every day | ORAL | Status: DC

## 2013-07-24 MED ORDER — ALBUTEROL SULFATE HFA 108 (90 BASE) MCG/ACT IN AERS: INHALATION_SPRAY | RESPIRATORY_TRACT | Status: DC

## 2013-07-24 MED ORDER — CETIRIZINE HCL 10 MG PO TABS: 10.0000 mg | ORAL_TABLET | Freq: Every day | ORAL | Status: DC

## 2013-07-24 NOTE — Progress Notes (Signed)
Patient ID: [name], female   DOB: 14-Jul-1980, 33 y.o.   MRN: [id number]  HPI:  R shoulder pain: present for several months. Occasionally has stiffness in R hand. Took ibuprofen but she is now out of this medicine. It helped her pain.  Chest pain: has pain in middle of chest around breastbone. Not worse with exertion, does not radiate to neck or left arm, no accompanied SOB, edema, nausea, or vomiting. Has felt briefly short of breath on occasion when waking up in the morning. This generally improves if she takes deep breaths, drinks a glass of water, or as a last resort, uses her albuterol inhaler. Takes H2 blocker but no PPI. No family hx of early cardiac death. No personal hx of HTN, DM. Nonsmoker. Does not have the pain right now.  Asthma: needs refill on asthma medicines. Using albuterol about 2-3 times per month. Takes symbicort BID, zyrtec, singulair.   ROS: See HPI  PMFSH: recent pregnancy, no longer breastfeeding  PHYSICAL EXAM: BP 118/74  Pulse 74  Temp(Src) 98.6 F (37 C) (Oral)  Ht 5\' 6"  (1.676 m)  Wt 262 lb (118.842 kg)  BMI 42.31 kg/m2 Gen: NAD, pleasant, cooperative HEENT: NCAT Heart: RRR, no murmurs Chest: nontender to palpation (does not reproduce pain) Lungs: CTAB, NWOB, no respiratory distress, speaks in full sentences easily Neuro: grossly nonfocal, speech normal Ext: R shoulder with full active range of motion. Negative spurlings bilaterally. Positive empty can test. Pain with internal rotation of shoulder. Full strength in bilateral upper extremities.  ASSESSMENT/PLAN:  See problem based charting for additional assessment/plan.  FOLLOW UP: F/u in 3 weeks for shoulder pain and chest pain.  GrenadaBrittany J. Pollie MeyerMcIntyre, MD [facility]

## 2013-07-24 NOTE — Patient Instructions (Addendum)
It was great to see you again today!  Shoulder pain: -see rotator cuff exercises -I sent in meloxicam, take it once daily for 7 days then as needed -use ice/heat on the shoulder a few times per day -follow up in 3 weeks  Chest pain: -I sent in new acid reflux medicine -follow up in 3 weeks to see if it is better  Be well, Dr. Pollie MeyerMcIntyre

## 2013-07-24 NOTE — Assessment & Plan Note (Signed)
Suspect chest pain related to GERD. No indication of cardiac etiology, and pt is low risk at age 89. Only risk factor is obesity. Strongly doubt this is anginal pain. Will add omeprazole 20mg  daily and f/u in 3 weeks to see if it has helped. Precepted with Dr. Lum BabeEniola who agrees with this plan.

## 2013-07-24 NOTE — Assessment & Plan Note (Signed)
Well controlled at this time. Continue current regimen. Medications refilled. F/u in 6 months for asthma.

## 2013-07-24 NOTE — Assessment & Plan Note (Signed)
Suspect rotator cuff impingement. Full strength on exam, no signs of tears. Will rx mobic 7.5 mg daily x 1 week then daily prn. Also recommend ice and/or heat several times daily. Handout given with rotator cuff exercises. F/u in 3 weeks.

## 2013-09-01 DIAGNOSIS — Z3480 Encounter for supervision of other normal pregnancy, unspecified trimester: Secondary | ICD-10-CM | POA: Diagnosis not present

## 2013-09-15 DIAGNOSIS — Z3689 Encounter for other specified antenatal screening: Secondary | ICD-10-CM | POA: Diagnosis not present

## 2013-10-14 DIAGNOSIS — Z3689 Encounter for other specified antenatal screening: Secondary | ICD-10-CM | POA: Diagnosis not present

## 2013-10-14 DIAGNOSIS — Z3480 Encounter for supervision of other normal pregnancy, unspecified trimester: Secondary | ICD-10-CM | POA: Diagnosis not present

## 2013-10-28 ENCOUNTER — Encounter: Payer: Self-pay | Admitting: Family Medicine

## 2013-10-28 ENCOUNTER — Ambulatory Visit (INDEPENDENT_AMBULATORY_CARE_PROVIDER_SITE_OTHER): Payer: Medicare Other | Admitting: Family Medicine

## 2013-10-28 VITALS — BP 103/70 | HR 89 | Temp 98.5°F | Ht 66.0 in | Wt 264.2 lb

## 2013-10-28 DIAGNOSIS — J4551 Severe persistent asthma with (acute) exacerbation: Secondary | ICD-10-CM

## 2013-10-28 MED ORDER — PREDNISONE 20 MG PO TABS: 20.0000 mg | ORAL_TABLET | Freq: Every day | ORAL | Status: DC

## 2013-10-28 NOTE — Patient Instructions (Addendum)
Thank you for coming in, today!  I think this is an exacerbation of your asthma. I want you to use your albuterol inhaler every 4 hours while you're awake for the next 1-2 days. Continue your other regular asthma medications. I will give you a prescription for prednisone 20 mg to take once a day every other day. This is safe in pregnancy at this stage and a very low dose. You can also try Mucinex over the counter, but take it at the lowest dose and every other day if needed.  Come back to see Dr. Pollie MeyerMcIntyre as you need. If you have worse symptoms, start running fevers (over 100.3 that Tylenol does not help), or have worse difficulty breathing, come back sooner rather than later, or come to the emergency room.  Please feel free to call with any questions or concerns at any time, at [phone]. --Dr. Casper HarrisonStreet

## 2013-10-28 NOTE — Progress Notes (Signed)
   Subjective:    Patient ID: [name], female    DOB: [birth year], 33 y.o.   MRN: [id number]  HPI: [facility] worse asthma symptoms for about 1 week. She reports cough, wheezing, and SOB with activity (going up stairs, lifting, walking for extended periods of time), and some productive cough with greenish sputum for 3-4 days. She denies fevers, nausea, or vomiting. She does have some chest pain with her cough, described as "muscle soreness." She reports compliance with Zyrtec, Singulair, Symbicort BID, and albuterol inhaler PRN. She has had to use her nebulizer machine twice in the past week and her inhaler at least once every day. She states this is the first time she's needed her nebulizer in about 1 year. She attributes some of this to changes in the weather.  Of note, pt is 5 months pregnant, [facility]. She reports no other complications with the pregnancy.  Review of Systems: As above.     Objective:   Physical Exam BP 103/70  Pulse 89  Temp(Src) 98.5 F (36.9 C) (Oral)  Ht 5\' 6"  (1.676 m)  Wt 264 lb 3.2 oz (119.84 kg)  BMI 42.66 kg/m2  SpO2 97% Gen: well-appearing adult female in NAD HEENT: [place]/AT, EOMI, PERRLA, MMM, TM's clear bilaterally  Nasal mucosae and posterior oropharynx mildly red Neck: supple, mild soft-tissue fullness but no frank lymphadenopathy Cardio: RRR, no murmur appreciated Pulm: loud diffuse wheezes and breath sounds slightly diminished at bases  No focal consolidations, air sounds equal bilaterally, otherwise Ext: warm, well-perfused     Assessment & Plan:  33yo female, currently pregnant, with exacerbation of persistent severe asthma - possible mild URI +/- weather changes as trigger for worsening symptoms - no evidence for frank pneumonia, at this time - recommended continuing Symbicort, Singulair, and Zyrtec - advised scheduled albuterol inhaler use for the next 2-3 days, q4h while awake - Rx for prednisone  20 mg every other day for 3 doses (avoiding higher, daily dosing in pregnancy) - also recommended Mucinex OTC for congestion, again using lowest dose and every other day - reviewed red flags that would prompt immediate return to care or presentation to the ED - f/u PRN with PCP Dr. Pollie MeyerMcIntyre, otherwise (due for asthma f/u in a couple of months, anyway)  Note FYI to Dr. Levora DredgeMcIntyre  Christopher M Street, MD PGY-3, [facility] 10/28/2013, 10:35 AM

## 2013-10-29 DIAGNOSIS — O358XX0 Maternal care for other (suspected) fetal abnormality and damage, not applicable or unspecified: Secondary | ICD-10-CM | POA: Diagnosis not present

## 2013-10-29 DIAGNOSIS — Z3492 Encounter for supervision of normal pregnancy, unspecified, second trimester: Secondary | ICD-10-CM | POA: Diagnosis not present

## 2013-11-16 ENCOUNTER — Encounter: Payer: Self-pay | Admitting: Family Medicine

## 2013-11-26 DIAGNOSIS — R197 Diarrhea, unspecified: Secondary | ICD-10-CM | POA: Diagnosis not present

## 2013-11-26 DIAGNOSIS — Z130 Encounter for screening for diseases of the blood and blood-forming organs and certain disorders involving the immune mechanism: Secondary | ICD-10-CM | POA: Diagnosis not present

## 2013-11-27 DIAGNOSIS — Z130 Encounter for screening for diseases of the blood and blood-forming organs and certain disorders involving the immune mechanism: Secondary | ICD-10-CM | POA: Diagnosis not present

## 2013-11-27 DIAGNOSIS — R197 Diarrhea, unspecified: Secondary | ICD-10-CM | POA: Diagnosis not present

## 2013-12-22 DIAGNOSIS — Z36 Encounter for antenatal screening of mother: Secondary | ICD-10-CM | POA: Diagnosis not present

## 2014-01-03 ENCOUNTER — Inpatient Hospital Stay (HOSPITAL_COMMUNITY)
Admission: AD | Admit: 2014-01-03 | Discharge: 2014-01-03 | Disposition: A | Discharge status: Home / Self Care | Payer: Medicare Other | Source: Ambulatory Visit | Attending: Obstetrics and Gynecology | Admitting: Obstetrics and Gynecology

## 2014-01-03 ENCOUNTER — Encounter (HOSPITAL_COMMUNITY): Payer: Self-pay | Admitting: *Deleted

## 2014-01-03 DIAGNOSIS — M533 Sacrococcygeal disorders, not elsewhere classified: Secondary | ICD-10-CM | POA: Diagnosis not present

## 2014-01-03 DIAGNOSIS — O26893 Other specified pregnancy related conditions, third trimester: Secondary | ICD-10-CM | POA: Insufficient documentation

## 2014-01-03 DIAGNOSIS — Z3A30 30 weeks gestation of pregnancy: Secondary | ICD-10-CM | POA: Diagnosis not present

## 2014-01-03 DIAGNOSIS — R102 Pelvic and perineal pain: Secondary | ICD-10-CM | POA: Diagnosis not present

## 2014-01-03 LAB — URINALYSIS, ROUTINE W REFLEX MICROSCOPIC
BILIRUBIN URINE: NEGATIVE
Glucose, UA: NEGATIVE mg/dL
Hgb urine dipstick: NEGATIVE
Ketones, ur: NEGATIVE mg/dL
LEUKOCYTES UA: NEGATIVE
NITRITE: NEGATIVE
Protein, ur: NEGATIVE mg/dL
Urobilinogen, UA: 0.2 mg/dL (ref 0.0–1.0)
pH: 6 (ref 5.0–8.0)

## 2014-01-03 MED ORDER — OXYCODONE-ACETAMINOPHEN 5-325 MG PO TABS: 1.0000 | ORAL_TABLET | Freq: Four times a day (QID) | ORAL | Status: DC | PRN

## 2014-01-03 MED ORDER — DOCUSATE SODIUM 50 MG PO CAPS: 100.0000 mg | ORAL_CAPSULE | Freq: Two times a day (BID) | ORAL | Status: DC | PRN

## 2014-01-03 NOTE — Discharge Instructions (Signed)
Tailbone Injury  The tailbone (coccyx) is the small bone at the lower end of the spine. A tailbone injury may involve stretched ligaments, bruising, or a broken bone (fracture). Women are more vulnerable to this injury due to having a wider pelvis.  CAUSES   This type of injury typically occurs from falling and landing on the tailbone. Repeated strain or friction from actions such as rowing and bicycling may also injure the area. The tailbone can be injured during childbirth. Infections or tumors may also press on the tailbone and cause pain. Sometimes, the cause of injury is unknown.  SYMPTOMS    Bruising.   Pain when sitting.   Painful bowel movements.   In women, pain during intercourse.  DIAGNOSIS   Your caregiver can diagnose a tailbone injury based on your symptoms and a physical exam. X-rays may be taken if a fracture is suspected. Your caregiver may also use an MRI scan imaging test to evaluate your symptoms.  TREATMENT   Your caregiver may prescribe medicines to help relieve your pain. Most tailbone injuries heal on their own in 4 to 6 weeks. However, if the injury is caused by an infection or tumor, the recovery period may vary.  PREVENTION   Wear appropriate padding and sports gear when bicycling and rowing. This can help prevent an injury from repeated strain or friction.  HOME CARE INSTRUCTIONS    Put ice on the injured area.   Put ice in a plastic bag.   Place a towel between your skin and the bag.   Leave the ice on for 15-20 minutes, every hour while awake for the first 1 to 2 days.   Sit on a large, rubber or inflated ring or cushion to ease your pain. Lean forward when sitting to help decrease discomfort.   Avoid sitting for long periods of time.   Increase your activity as the pain allows.   Only take over-the-counter or prescription medicines for pain, discomfort, or fever as directed by your caregiver.   You may use stool softeners if it is painful to have a bowel movement, or as  directed by your caregiver.   Eat a diet with plenty of fiber to help prevent constipation.   Keep all follow-up appointments as directed by your caregiver.  SEEK MEDICAL CARE IF:    Your pain becomes worse.   Your bowel movements cause a great deal of discomfort.   You are unable to have a bowel movement.   You have a fever.  MAKE SURE YOU:   Understand these instructions.   Will watch your condition.   Will get help right away if you are not doing well or get worse.  Document Released: 12/30/1999 Document Revised: 03/26/2011 Document Reviewed: 07/27/2010  Exit[employer] Patient Information 2015 [employer]. This information is not intended to replace advice given to you by your health care provider. Make sure you discuss any questions you have with your health care provider.

## 2014-01-03 NOTE — MAU Provider Note (Signed)
History  33 yo [id number] @ 30.2 wks by 14.4 wk scan presents unannounced to MAU w/ c/o tailbone pain x 2 days, worse today. Tried various position changes w/o success. Reports active fetus. Denies ctxs, LOF or VB. Last BM 2 days ago, however strained to completely empty bowel. Was prescribed Fe, but does not take due to side effect of constipation.    Patient Active Problem List   Diagnosis Date Noted  . Coccyx pain 01/03/2014  . Right shoulder pain 07/24/2013  . Pes planus 07/10/2012  . Plantar fasciitis, bilateral 07/10/2012  . Supervision of other normal pregnancy 04/18/2012  . Vaginal discharge 04/18/2012  . Infection of urinary tract 04/16/2012  . Positive pregnancy test 01/24/2012  . Drug abuse, marijuana 01/20/2011  . Severe persistent asthma 12/28/2009  . CONSTIPATION, CHRONIC 09/10/2008  . COMMON MIGRAINE 03/20/2007  . OBESITY NOS 05/24/2006  . RHINITIS, ALLERGIC 03/14/2006  . GASTROESOPHAGEAL REFLUX, NO ESOPHAGITIS 03/14/2006    Chief Complaint  Patient presents with  . Tailbone Pain   HPI As above OB History    Gravida Para Term Preterm AB TAB SAB Ectopic Multiple Living   7 2 2  0 4 2 2  0 0 2      Past Medical History  Diagnosis Date  . Asthma      reservations to 2-3 timmes  a year that required ED visits/hospitalizations. Never intubated  . GERD (gastroesophageal reflux disease)     Past Surgical History  Procedure Laterality Date  . Cesarean section    . Cesarean section N/A 10/25/2012    Procedure: CESAREAN SECTION;  Surgeon: Dorien Chihuahuaara J. Richardson Doppole, MD;  Location: [facility];  Service: Obstetrics;  Laterality: N/A;    Family History  Problem Relation Age of Onset  . Hypertension Mother   . Asthma Father   . Emphysema Maternal Grandfather   . Allergies Maternal Grandmother   . Asthma Maternal Grandmother     History  Substance Use Topics  . Smoking status: Former Smoker -- 0.30 packs/day for .5 years    Types: Cigarettes    Quit date: 01/15/2001  .  Smokeless tobacco: Never Used  . Alcohol Use: No     Comment: occasionaly    Allergies:  Allergies  Allergen Reactions  . Iohexol Hives and Itching       . Moxifloxacin Hives and Itching    No prescriptions prior to admission    ROS  Tailbone pain Physical Exam   Blood pressure 126/63, pulse 92, temperature 98.7 F (37.1 C), temperature source Oral, resp. rate 20, height 5' 5.5" (1.664 m), weight 269 lb 2 oz (122.074 kg), last menstrual period 06/05/2013, unknown if currently breastfeeding.  Physical Exam Results for orders placed or performed during the hospital encounter of 01/03/14 (from the past 24 hour(s))  Urinalysis, Routine w reflex microscopic     Status: Abnormal   Collection Time: 01/03/14  5:48 PM  Result Value Ref Range   Color, Urine YELLOW YELLOW   APPearance CLEAR CLEAR   Specific Gravity, Urine >1.030 (H) 1.005 - 1.030   pH 6.0 5.0 - 8.0   Glucose, UA NEGATIVE NEGATIVE mg/dL   Hgb urine dipstick NEGATIVE NEGATIVE   Bilirubin Urine NEGATIVE NEGATIVE   Ketones, ur NEGATIVE NEGATIVE mg/dL   Protein, ur NEGATIVE NEGATIVE mg/dL   Urobilinogen, UA 0.2 0.0 - 1.0 mg/dL   Nitrite NEGATIVE NEGATIVE   Leukocytes, UA NEGATIVE NEGATIVE  Gen: Mild distress Tender to palpation in coccyx area Pelvic: Deferred FHRT: Cat  1 Toco: No ctxs ED Course  Assessment: IUP at 30.2 wks Coccyx pain Cat 1 FHRT  Plan: Reviewed coccyx pain as a common discomfort of pregnancy. Discussed that baby's head can press on coccyx and cause pain. Advised: 1) Donut pillow or inflatable ring (cushion) for comfort. 2) Frequent emptying of bowel; no straining - dietary modification. 3) Avoiding triggers, i.e. pressure on tailbone (lying on back, sitting on hard surface, prolonged sitting, intercourse or strenuous exercise). 4) Exercise ball during the day. 5) Forward leaning when sitting. 6) Wear shoes with cushy sole. 7) Frequent position changes. 8) Heat or ice pack to affected  area. 9) Warm bath. 10) Yoga. 11) Swimming. 12) Hydrate. Rx Percocet tabs and Colace. OB f/u as previously scheduled.    Sherre ScarletWILLIAMS, Lavonne Cass CNM, MS 01/03/2014 8:24 PM

## 2014-01-03 NOTE — MAU Note (Signed)
Patient presents at 30w 2d gestation with complaint of tailbone pain X 2 days. Denies vaginal bleeding or discharge. Fetus active.

## 2014-01-21 DIAGNOSIS — Z3A32 32 weeks gestation of pregnancy: Secondary | ICD-10-CM | POA: Diagnosis not present

## 2014-01-21 DIAGNOSIS — O26849 Uterine size-date discrepancy, unspecified trimester: Secondary | ICD-10-CM | POA: Diagnosis not present

## 2014-02-11 DIAGNOSIS — Z36 Encounter for antenatal screening of mother: Secondary | ICD-10-CM | POA: Diagnosis not present

## 2014-02-11 DIAGNOSIS — O99213 Obesity complicating pregnancy, third trimester: Secondary | ICD-10-CM | POA: Diagnosis not present

## 2014-02-11 DIAGNOSIS — Z3493 Encounter for supervision of normal pregnancy, unspecified, third trimester: Secondary | ICD-10-CM | POA: Diagnosis not present

## 2014-02-15 DIAGNOSIS — O99213 Obesity complicating pregnancy, third trimester: Secondary | ICD-10-CM | POA: Diagnosis not present

## 2014-02-18 ENCOUNTER — Other Ambulatory Visit: Payer: Self-pay | Admitting: Obstetrics and Gynecology

## 2014-02-22 DIAGNOSIS — O99213 Obesity complicating pregnancy, third trimester: Secondary | ICD-10-CM | POA: Diagnosis not present

## 2014-02-22 DIAGNOSIS — Z3490 Encounter for supervision of normal pregnancy, unspecified, unspecified trimester: Secondary | ICD-10-CM | POA: Diagnosis not present

## 2014-02-25 DIAGNOSIS — O99213 Obesity complicating pregnancy, third trimester: Secondary | ICD-10-CM | POA: Diagnosis not present

## 2014-02-25 DIAGNOSIS — Z3A37 37 weeks gestation of pregnancy: Secondary | ICD-10-CM | POA: Diagnosis not present

## 2014-02-25 DIAGNOSIS — Z3483 Encounter for supervision of other normal pregnancy, third trimester: Secondary | ICD-10-CM | POA: Diagnosis not present

## 2014-02-25 DIAGNOSIS — Z113 Encounter for screening for infections with a predominantly sexual mode of transmission: Secondary | ICD-10-CM | POA: Diagnosis not present

## 2014-02-25 DIAGNOSIS — O24419 Gestational diabetes mellitus in pregnancy, unspecified control: Secondary | ICD-10-CM | POA: Diagnosis not present

## 2014-02-26 ENCOUNTER — Encounter (HOSPITAL_COMMUNITY): Payer: Self-pay

## 2014-03-04 ENCOUNTER — Encounter (HOSPITAL_COMMUNITY): Payer: Self-pay

## 2014-03-04 ENCOUNTER — Encounter (HOSPITAL_COMMUNITY)
Admission: RE | Admit: 2014-03-04 | Discharge: 2014-03-04 | Disposition: A | Discharge status: Home / Self Care | Payer: Medicare Other | Source: Ambulatory Visit | Attending: Obstetrics and Gynecology | Admitting: Obstetrics and Gynecology

## 2014-03-04 DIAGNOSIS — O24419 Gestational diabetes mellitus in pregnancy, unspecified control: Secondary | ICD-10-CM | POA: Diagnosis not present

## 2014-03-04 DIAGNOSIS — O99213 Obesity complicating pregnancy, third trimester: Secondary | ICD-10-CM | POA: Diagnosis not present

## 2014-03-04 DIAGNOSIS — Z3A38 38 weeks gestation of pregnancy: Secondary | ICD-10-CM | POA: Diagnosis not present

## 2014-03-04 LAB — CBC
HEMATOCRIT: 32.7 % — AB (ref 36.0–46.0)
Hemoglobin: 10.6 g/dL — ABNORMAL LOW (ref 12.0–15.0)
MCH: 25.7 pg — ABNORMAL LOW (ref 26.0–34.0)
MCHC: 32.4 g/dL (ref 30.0–36.0)
MCV: 79.4 fL (ref 78.0–100.0)
Platelets: 268 10*3/uL (ref 150–400)
RBC: 4.12 MIL/uL (ref 3.87–5.11)
RDW: 16.8 % — ABNORMAL HIGH (ref 11.5–15.5)
WBC: 8.8 10*3/uL (ref 4.0–10.5)

## 2014-03-04 LAB — TYPE AND SCREEN
ABO/RH(D): O POS
Antibody Screen: NEGATIVE

## 2014-03-04 MED ORDER — DEXTROSE 5 % IV SOLN
3.0000 g | INTRAVENOUS | Status: AC
  Administered 2014-03-05: 3 g via INTRAVENOUS
  Filled 2014-03-04: qty 3000

## 2014-03-04 NOTE — H&P (Signed)
[name] is a 34 y.o. female, [id number] at 79 weeks, presenting for Repeat C/S .  Patient also with anemia and morbid obesity.    Patient Active Problem List   Diagnosis Date Noted  . Coccyx pain 01/03/2014  . Right shoulder pain 07/24/2013  . Pes planus 07/10/2012  . Plantar fasciitis, bilateral 07/10/2012  . Supervision of other normal pregnancy 04/18/2012  . Vaginal discharge 04/18/2012  . Infection of urinary tract 04/16/2012  . Positive pregnancy test 01/24/2012  . Drug abuse, marijuana 01/20/2011  . Severe persistent asthma 12/28/2009  . CONSTIPATION, CHRONIC 09/10/2008  . COMMON MIGRAINE 03/20/2007  . OBESITY NOS 05/24/2006  . RHINITIS, ALLERGIC 03/14/2006  . GASTROESOPHAGEAL REFLUX, NO ESOPHAGITIS 03/14/2006    History of present pregnancy: Patient entered care at 9.4 weeks.   EDC of 03/12/2014 was established by 14.4wk [place] on 09/15/2013.   Anatomy scan:  18.5 weeks, with normal, but limited, findings and an low lying posterior placenta.   Additional [place] evaluations: -14.4wks: SIUP. P. Placenta, CL 4.4cm -20.6wks: F/U Anatomy-All parts seen, Breech Presentation, No longer low lying, Normal Fluid -32.6wks: EFW 1937g, linear growth, AFI 15.47cm. -35.6wks: BPP 8/8. Vertex, Posterior, AFI 14.1cm -36.3wks: 4lbs 4oz 24%, nl fluid, AFI 16, BPP 8/8, vtx, post placenta. -37.6wks: Vertex pres. Posterior placenta. WNL fluid. AFI of 16.2 cm= 60%tile. BPP 8/8 in 30 mins. -38.6wks: [place]: SIUP, VERTEX, NORMAL FLUID, BPP 8/8.  -Vertex. posterior placenta. normal fluid. AFI of 12.7 cm = 50th%tile. BPP 8 of 8 in 13 mins. Significant prenatal events:  Patient with c/o tailbone pain, fatigue, and incident of diarrhea/stomach pain.    Last evaluation:  03/04/2014 by Dr. AVS.  FHR 151, BP 120/60, Wt 268lbs  OB History    Gravida Para Term Preterm AB TAB SAB Ectopic Multiple Living   0 0 0 2    2004-Primary C/S 2008-TAB 2010-TAB 2013-SAB 2014-SAB 2014-Repeat  C/S 2016-Current   Past Medical History  Diagnosis Date  . Asthma      reservations to 2-3 timmes  a year that required ED visits/hospitalizations. Never intubated  . GERD (gastroesophageal reflux disease)    Past Surgical History  Procedure Laterality Date  . Cesarean section    . Cesarean section N/A 10/25/2012    Procedure: CESAREAN SECTION;  Surgeon: Dorien Chihuahua. Richardson Dopp, MD;  Location: [facility];  Service: Obstetrics;  Laterality: N/A;   Family History: family history includes Allergies in her maternal grandmother; Asthma in her father and maternal grandmother; Emphysema in her maternal grandfather; Hypertension in her mother. Social History:  reports that she quit smoking about 13 years ago. Her smoking use included Cigarettes. She has a .15 pack-year smoking history. She has never used smokeless tobacco. She reports that she does not drink alcohol or use illicit drugs.   Prenatal Transfer Tool  Maternal Diabetes: No Genetic Screening: Declined Maternal Ultrasounds/Referrals: Normal Fetal Ultrasounds or other Referrals:  None Maternal Substance Abuse:  No Significant Maternal Medications:  Meds include: Other:  Prilosec, Singulair, Symbicort, ProAir, PNV Significant Maternal Lab Results: Lab values include: Group B Strep positive    ROS:  +BH Contractions, +FM, -LOF, -VB, at last appt on 2/18  Allergies  Allergen Reactions  . Iohexol Hives and Itching       . Moxifloxacin Hives and Itching       Last menstrual period 06/05/2013, unknown if currently breastfeeding.  Physical Exam General:  No Distress Chest: Lungs CTA, Heart RRR Abdomen: Gravid, NT,  Soft, Appears AGA Skin: Warm Dry Ext: WNL Pelvic: Deferred  FHR: 151 by doppler on 2/18 UCs:  None  Prenatal labs: ABO, Rh:  O Positive Antibody:  Negative  Rubella:   Immune RPR:   NR HBsAg:   Negative HIV:   Negtive GBS:  Positive Sickle cell/Hgb electrophoresis:  Unknown Pap:  Unknown GC:  Negative Chlamydia:   Negative Genetic screenings:  Declined Glucola:  Abnormal 1 hour, Normal 3 hr Other:  TSH-Normal   Assessment IUP at 39 weeks Repeat C/S GBS Positive AMA Morbid Obesity  [facility]: [facility] with Dr. Lance MorinA. Jamicah Anstead Routine Pre-Op Orders  Will assess and confirm above information prior to procedure anesthesia start  Hard[facility] Wilson Memorial HospitalEMLY, JESSICA LYNN MSN, CNM 03/04/2014, 10:09 AM  Agree with above, AYR

## 2014-03-04 NOTE — Patient Instructions (Addendum)
   Your procedure is scheduled on: Mar 05 2014 AT 330PM  [facility] at[facility]  Pick up the phone at the desk and dial [phone] and inform us of your arrival.  Please call this number if you have any problems the morning of surgery: [phone]  Remember: Do not eat food after 6AM ON FEB 19   Do not drink clear liquids after: 11030 AM FEB 19 Take these medicines the morning of surgery with a SIP OF WATER: TAKE SYMBOCORT   Do not wear jewelry, make-up, or FINGER nail polish No metal in your hair or on your body. Do not wear lotions, powders, perfumes.  You may wear deodorant.  Do not bring valuables to the hospital. Contacts, dentures or bridgework may not be worn into surgery.  Leave suitcase in the car. After Surgery it may be brought to your room. For patients being admitted to the hospital, checkout time is 11:00am the day of discharge.    Patients discharged on the day of surgery will not be allowed to drive home.

## 2014-03-05 ENCOUNTER — Encounter (HOSPITAL_COMMUNITY): Payer: Self-pay | Admitting: *Deleted

## 2014-03-05 ENCOUNTER — Inpatient Hospital Stay (HOSPITAL_COMMUNITY): Payer: Medicare Other | Admitting: Anesthesiology

## 2014-03-05 ENCOUNTER — Inpatient Hospital Stay (HOSPITAL_COMMUNITY)
Admission: RE | Admit: 2014-03-05 | Discharge: 2014-03-08 | DRG: 765 | Disposition: A | Discharge status: Home / Self Care | Payer: Medicare Other | Source: Ambulatory Visit | Attending: Obstetrics and Gynecology | Admitting: Obstetrics and Gynecology

## 2014-03-05 ENCOUNTER — Encounter (HOSPITAL_COMMUNITY)
Admission: RE | Disposition: A | Discharge status: Home / Self Care | Payer: Self-pay | Source: Ambulatory Visit | Attending: Obstetrics and Gynecology

## 2014-03-05 DIAGNOSIS — Z98891 History of uterine scar from previous surgery: Secondary | ICD-10-CM

## 2014-03-05 DIAGNOSIS — O9902 Anemia complicating childbirth: Secondary | ICD-10-CM | POA: Diagnosis present

## 2014-03-05 DIAGNOSIS — Z3A39 39 weeks gestation of pregnancy: Secondary | ICD-10-CM | POA: Diagnosis present

## 2014-03-05 DIAGNOSIS — O99214 Obesity complicating childbirth: Secondary | ICD-10-CM | POA: Diagnosis present

## 2014-03-05 DIAGNOSIS — O3421 Maternal care for scar from previous cesarean delivery: Secondary | ICD-10-CM | POA: Diagnosis not present

## 2014-03-05 DIAGNOSIS — O99824 Streptococcus B carrier state complicating childbirth: Secondary | ICD-10-CM | POA: Diagnosis present

## 2014-03-05 DIAGNOSIS — D649 Anemia, unspecified: Secondary | ICD-10-CM | POA: Diagnosis present

## 2014-03-05 DIAGNOSIS — Z3483 Encounter for supervision of other normal pregnancy, third trimester: Secondary | ICD-10-CM | POA: Diagnosis not present

## 2014-03-05 DIAGNOSIS — Z6841 Body Mass Index (BMI) 40.0 and over, adult: Secondary | ICD-10-CM | POA: Diagnosis not present

## 2014-03-05 LAB — RPR: RPR Ser Ql: NONREACTIVE

## 2014-03-05 SURGERY — Surgical Case
Anesthesia: Spinal | Site: Abdomen

## 2014-03-05 MED ORDER — NALBUPHINE HCL 10 MG/ML IJ SOLN
INTRAMUSCULAR | Status: AC
  Filled 2014-03-05: qty 1

## 2014-03-05 MED ORDER — SCOPOLAMINE 1 MG/3DAYS TD PT72
MEDICATED_PATCH | TRANSDERMAL | Status: AC
  Filled 2014-03-05: qty 1

## 2014-03-05 MED ORDER — FENTANYL CITRATE 0.05 MG/ML IJ SOLN
INTRAMUSCULAR | Status: AC
  Filled 2014-03-05: qty 2

## 2014-03-05 MED ORDER — LANOLIN HYDROUS EX OINT: 1.0000 "application " | TOPICAL_OINTMENT | CUTANEOUS | Status: DC | PRN

## 2014-03-05 MED ORDER — PROMETHAZINE HCL 25 MG/ML IJ SOLN: 6.2500 mg | INTRAMUSCULAR | Status: DC | PRN

## 2014-03-05 MED ORDER — NALBUPHINE HCL 10 MG/ML IJ SOLN
5.0000 mg | Freq: Once | INTRAMUSCULAR | Status: AC | PRN
  Administered 2014-03-05: 5 mg via INTRAVENOUS

## 2014-03-05 MED ORDER — KETOROLAC TROMETHAMINE 30 MG/ML IJ SOLN: 30.0000 mg | Freq: Four times a day (QID) | INTRAMUSCULAR | Status: DC | PRN

## 2014-03-05 MED ORDER — MORPHINE SULFATE (PF) 0.5 MG/ML IJ SOLN
INTRAMUSCULAR | Status: DC | PRN
  Administered 2014-03-05: .2 mg via INTRATHECAL

## 2014-03-05 MED ORDER — ONDANSETRON HCL 4 MG/2ML IJ SOLN: 4.0000 mg | Freq: Three times a day (TID) | INTRAMUSCULAR | Status: DC | PRN

## 2014-03-05 MED ORDER — SIMETHICONE 80 MG PO CHEW
80.0000 mg | CHEWABLE_TABLET | Freq: Three times a day (TID) | ORAL | Status: DC
  Administered 2014-03-06 – 2014-03-08 (×6): 80 mg via ORAL
  Filled 2014-03-05 (×6): qty 1

## 2014-03-05 MED ORDER — SODIUM CHLORIDE 0.9 % IJ SOLN: 3.0000 mL | INTRAMUSCULAR | Status: DC | PRN

## 2014-03-05 MED ORDER — HYDROMORPHONE HCL 1 MG/ML IJ SOLN
INTRAMUSCULAR | Status: AC
  Administered 2014-03-05: 0.5 mg via INTRAVENOUS
  Filled 2014-03-05: qty 1

## 2014-03-05 MED ORDER — LACTATED RINGERS IV SOLN
INTRAVENOUS | Status: DC
  Administered 2014-03-06 (×2): via INTRAVENOUS

## 2014-03-05 MED ORDER — OXYCODONE-ACETAMINOPHEN 5-325 MG PO TABS
1.0000 | ORAL_TABLET | ORAL | Status: DC | PRN
  Administered 2014-03-08: 1 via ORAL
  Filled 2014-03-05: qty 1

## 2014-03-05 MED ORDER — FENTANYL CITRATE 0.05 MG/ML IJ SOLN
INTRAMUSCULAR | Status: DC | PRN
  Administered 2014-03-05 (×2): 50 ug via INTRAVENOUS
  Administered 2014-03-05: 12.5 ug via INTRATHECAL

## 2014-03-05 MED ORDER — NALOXONE HCL 1 MG/ML IJ SOLN: 1.0000 ug/kg/h | INTRAMUSCULAR | Status: DC | PRN

## 2014-03-05 MED ORDER — ONDANSETRON HCL 4 MG/2ML IJ SOLN: 4.0000 mg | INTRAMUSCULAR | Status: DC | PRN

## 2014-03-05 MED ORDER — DIBUCAINE 1 % RE OINT: 1.0000 "application " | TOPICAL_OINTMENT | RECTAL | Status: DC | PRN

## 2014-03-05 MED ORDER — ONDANSETRON HCL 4 MG PO TABS: 4.0000 mg | ORAL_TABLET | ORAL | Status: DC | PRN

## 2014-03-05 MED ORDER — BUPIVACAINE IN DEXTROSE 0.75-8.25 % IT SOLN: INTRATHECAL | Status: DC | PRN

## 2014-03-05 MED ORDER — OXYTOCIN 40 UNITS IN LACTATED RINGERS INFUSION - SIMPLE MED
INTRAVENOUS | Status: DC | PRN
  Administered 2014-03-05: 40 [IU] via INTRAVENOUS

## 2014-03-05 MED ORDER — NALOXONE HCL 0.4 MG/ML IJ SOLN: 0.4000 mg | INTRAMUSCULAR | Status: DC | PRN

## 2014-03-05 MED ORDER — KETOROLAC TROMETHAMINE 30 MG/ML IJ SOLN
30.0000 mg | Freq: Four times a day (QID) | INTRAMUSCULAR | Status: DC | PRN
Start: 2014-03-05 — End: 2014-03-05

## 2014-03-05 MED ORDER — PHENYLEPHRINE 8 MG IN D5W 100 ML (0.08MG/ML) PREMIX OPTIME
INJECTION | INTRAVENOUS | Status: DC | PRN
  Administered 2014-03-05: 60 ug/min via INTRAVENOUS

## 2014-03-05 MED ORDER — OXYCODONE-ACETAMINOPHEN 5-325 MG PO TABS
2.0000 | ORAL_TABLET | ORAL | Status: DC | PRN
  Administered 2014-03-06 – 2014-03-08 (×6): 2 via ORAL
  Filled 2014-03-05 (×8): qty 2

## 2014-03-05 MED ORDER — HYDROMORPHONE HCL 1 MG/ML IJ SOLN
INTRAMUSCULAR | Status: AC
  Filled 2014-03-05: qty 1

## 2014-03-05 MED ORDER — IBUPROFEN 600 MG PO TABS
600.0000 mg | ORAL_TABLET | Freq: Four times a day (QID) | ORAL | Status: DC
  Administered 2014-03-05 – 2014-03-08 (×11): 600 mg via ORAL
  Filled 2014-03-05 (×11): qty 1

## 2014-03-05 MED ORDER — PHENYLEPHRINE 40 MCG/ML (10ML) SYRINGE FOR IV PUSH (FOR BLOOD PRESSURE SUPPORT)
PREFILLED_SYRINGE | INTRAVENOUS | Status: AC
  Filled 2014-03-05: qty 10

## 2014-03-05 MED ORDER — NALBUPHINE HCL 10 MG/ML IJ SOLN: 5.0000 mg | Freq: Once | INTRAMUSCULAR | Status: AC | PRN

## 2014-03-05 MED ORDER — BUPIVACAINE IN DEXTROSE 0.75-8.25 % IT SOLN
INTRATHECAL | Status: DC | PRN
  Administered 2014-03-05: 1.6 mL via INTRATHECAL

## 2014-03-05 MED ORDER — NALBUPHINE HCL 10 MG/ML IJ SOLN
5.0000 mg | INTRAMUSCULAR | Status: DC | PRN
  Administered 2014-03-06: 5 mg via SUBCUTANEOUS
  Filled 2014-03-05: qty 1

## 2014-03-05 MED ORDER — DIPHENHYDRAMINE HCL 25 MG PO CAPS: 25.0000 mg | ORAL_CAPSULE | Freq: Four times a day (QID) | ORAL | Status: DC | PRN

## 2014-03-05 MED ORDER — NALBUPHINE HCL 10 MG/ML IJ SOLN: 5.0000 mg | INTRAMUSCULAR | Status: DC | PRN

## 2014-03-05 MED ORDER — OXYTOCIN 40 UNITS IN LACTATED RINGERS INFUSION - SIMPLE MED: 62.5000 mL/h | INTRAVENOUS | Status: AC

## 2014-03-05 MED ORDER — HYDROMORPHONE HCL 1 MG/ML IJ SOLN
INTRAMUSCULAR | Status: DC | PRN
  Administered 2014-03-05: 1 mg via INTRAVENOUS

## 2014-03-05 MED ORDER — SIMETHICONE 80 MG PO CHEW
80.0000 mg | CHEWABLE_TABLET | ORAL | Status: DC | PRN
  Administered 2014-03-06: 80 mg via ORAL

## 2014-03-05 MED ORDER — ONDANSETRON HCL 4 MG/2ML IJ SOLN
INTRAMUSCULAR | Status: DC | PRN
  Administered 2014-03-05: 4 mg via INTRAVENOUS

## 2014-03-05 MED ORDER — OXYTOCIN 10 UNIT/ML IJ SOLN
INTRAMUSCULAR | Status: AC
  Filled 2014-03-05: qty 4

## 2014-03-05 MED ORDER — PHENYLEPHRINE 8 MG IN D5W 100 ML (0.08MG/ML) PREMIX OPTIME
INJECTION | INTRAVENOUS | Status: AC
  Filled 2014-03-05: qty 100

## 2014-03-05 MED ORDER — PRENATAL MULTIVITAMIN CH
1.0000 | ORAL_TABLET | Freq: Every day | ORAL | Status: DC
  Administered 2014-03-06: 1 via ORAL
  Administered 2014-03-07: 2 via ORAL
  Administered 2014-03-08: 1 via ORAL
  Filled 2014-03-05 (×3): qty 1

## 2014-03-05 MED ORDER — SENNOSIDES-DOCUSATE SODIUM 8.6-50 MG PO TABS
2.0000 | ORAL_TABLET | ORAL | Status: DC
  Administered 2014-03-05 – 2014-03-07 (×3): 2 via ORAL
  Filled 2014-03-05 (×3): qty 2

## 2014-03-05 MED ORDER — LACTATED RINGERS IV SOLN
INTRAVENOUS | Status: DC | PRN
  Administered 2014-03-05: 15:00:00 via INTRAVENOUS

## 2014-03-05 MED ORDER — LACTATED RINGERS IV SOLN
Freq: Once | INTRAVENOUS | Status: AC
  Administered 2014-03-05 (×3): via INTRAVENOUS

## 2014-03-05 MED ORDER — LACTATED RINGERS IV SOLN
INTRAVENOUS | Status: DC
Start: 2014-03-05 — End: 2014-03-05
  Administered 2014-03-05: 125 mL/h via INTRAVENOUS
  Administered 2014-03-05: 14:00:00 via INTRAVENOUS

## 2014-03-05 MED ORDER — HYDROMORPHONE HCL 1 MG/ML IJ SOLN
0.2500 mg | INTRAMUSCULAR | Status: DC | PRN
  Administered 2014-03-05 (×2): 0.5 mg via INTRAVENOUS

## 2014-03-05 MED ORDER — TETANUS-DIPHTH-ACELL PERTUSSIS 5-2.5-18.5 LF-MCG/0.5 IM SUSP: 0.5000 mL | Freq: Once | INTRAMUSCULAR | Status: DC

## 2014-03-05 MED ORDER — 0.9 % SODIUM CHLORIDE (POUR BTL) OPTIME
TOPICAL | Status: DC | PRN
  Administered 2014-03-05: 1000 mL

## 2014-03-05 MED ORDER — FENTANYL CITRATE 0.05 MG/ML IJ SOLN: INTRAMUSCULAR | Status: DC | PRN

## 2014-03-05 MED ORDER — ZOLPIDEM TARTRATE 5 MG PO TABS: 5.0000 mg | ORAL_TABLET | Freq: Every evening | ORAL | Status: DC | PRN

## 2014-03-05 MED ORDER — DIPHENHYDRAMINE HCL 50 MG/ML IJ SOLN: 12.5000 mg | INTRAMUSCULAR | Status: DC | PRN

## 2014-03-05 MED ORDER — MORPHINE SULFATE 0.5 MG/ML IJ SOLN
INTRAMUSCULAR | Status: AC
  Filled 2014-03-05: qty 10

## 2014-03-05 MED ORDER — IBUPROFEN 600 MG PO TABS: 600.0000 mg | ORAL_TABLET | Freq: Four times a day (QID) | ORAL | Status: DC | PRN

## 2014-03-05 MED ORDER — MEPERIDINE HCL 25 MG/ML IJ SOLN: 6.2500 mg | INTRAMUSCULAR | Status: DC | PRN

## 2014-03-05 MED ORDER — DIPHENHYDRAMINE HCL 25 MG PO CAPS: 25.0000 mg | ORAL_CAPSULE | ORAL | Status: DC | PRN

## 2014-03-05 MED ORDER — MENTHOL 3 MG MT LOZG: 1.0000 | LOZENGE | OROMUCOSAL | Status: DC | PRN

## 2014-03-05 MED ORDER — WITCH HAZEL-GLYCERIN EX PADS: 1.0000 "application " | MEDICATED_PAD | CUTANEOUS | Status: DC | PRN

## 2014-03-05 MED ORDER — MORPHINE SULFATE (PF) 0.5 MG/ML IJ SOLN: INTRAMUSCULAR | Status: DC | PRN

## 2014-03-05 MED ORDER — SCOPOLAMINE 1 MG/3DAYS TD PT72
1.0000 | MEDICATED_PATCH | Freq: Once | TRANSDERMAL | Status: DC
  Administered 2014-03-05: 1.5 mg via TRANSDERMAL

## 2014-03-05 MED ORDER — SIMETHICONE 80 MG PO CHEW
80.0000 mg | CHEWABLE_TABLET | ORAL | Status: DC
  Administered 2014-03-05 – 2014-03-07 (×3): 80 mg via ORAL
  Filled 2014-03-05 (×3): qty 1

## 2014-03-05 MED ORDER — ONDANSETRON HCL 4 MG/2ML IJ SOLN
INTRAMUSCULAR | Status: AC
  Filled 2014-03-05: qty 2

## 2014-03-05 SURGICAL SUPPLY — 44 items
APL SKNCLS STERI-STRIP NONHPOA (GAUZE/BANDAGES/DRESSINGS) ×1
BENZOIN TINCTURE PRP APPL 2/3 (GAUZE/BANDAGES/DRESSINGS) ×3 IMPLANT
CLAMP CORD UMBIL (MISCELLANEOUS) IMPLANT
CLOSURE WOUND 1/2 X4 (GAUZE/BANDAGES/DRESSINGS) ×1
CLOTH BEACON ORANGE TIMEOUT ST (SAFETY) ×3 IMPLANT
CONTAINER PREFILL 10% NBF 15ML (MISCELLANEOUS) IMPLANT
DRAPE SHEET LG 3/4 BI-LAMINATE (DRAPES) IMPLANT
DRSG OPSITE POSTOP 4X10 (GAUZE/BANDAGES/DRESSINGS) ×3 IMPLANT
DURAPREP 26ML APPLICATOR (WOUND CARE) ×3 IMPLANT
ELECT REM PT RETURN 9FT ADLT (ELECTROSURGICAL) ×3
ELECTRODE REM PT RTRN 9FT ADLT (ELECTROSURGICAL) ×1 IMPLANT
EXTRACTOR VACUUM M CUP 4 TUBE (SUCTIONS) IMPLANT
EXTRACTOR VACUUM M CUP 4' TUBE (SUCTIONS)
GLOVE BIO SURGEON STRL SZ7.5 (GLOVE) ×3 IMPLANT
GLOVE BIOGEL PI IND STRL 7.0 (GLOVE) IMPLANT
GLOVE BIOGEL PI IND STRL 7.5 (GLOVE) ×1 IMPLANT
GLOVE BIOGEL PI INDICATOR 7.0 (GLOVE) ×6
GLOVE BIOGEL PI INDICATOR 7.5 (GLOVE) ×2
GLOVE SURG SS PI 7.0 STRL IVOR (GLOVE) ×4 IMPLANT
GOWN STRL REUS W/TWL LRG LVL3 (GOWN DISPOSABLE) ×8 IMPLANT
KIT ABG SYR 3ML LUER SLIP (SYRINGE) IMPLANT
NDL HYPO 25X5/8 SAFETYGLIDE (NEEDLE) IMPLANT
NEEDLE HYPO 25X5/8 SAFETYGLIDE (NEEDLE) IMPLANT
NS IRRIG 1000ML POUR BTL (IV SOLUTION) ×3 IMPLANT
PACK C SECTION WH (CUSTOM PROCEDURE TRAY) ×3 IMPLANT
PAD ABD 7.5X8 STRL (GAUZE/BANDAGES/DRESSINGS) ×2 IMPLANT
PAD OB MATERNITY 4.3X12.25 (PERSONAL CARE ITEMS) ×3 IMPLANT
RTRCTR C-SECT PINK 25CM LRG (MISCELLANEOUS) ×3 IMPLANT
SPONGE GAUZE 4X4 12PLY STER LF (GAUZE/BANDAGES/DRESSINGS) ×4 IMPLANT
SPONGE LAP 18X18 X RAY DECT (DISPOSABLE) ×2 IMPLANT
STRIP CLOSURE SKIN 1/2X4 (GAUZE/BANDAGES/DRESSINGS) ×2 IMPLANT
SUT CHROMIC 2 0 CT 1 (SUTURE) ×3 IMPLANT
SUT MNCRL AB 3-0 PS2 27 (SUTURE) ×3 IMPLANT
SUT PLAIN 0 NONE (SUTURE) IMPLANT
SUT PLAIN 2 0 XLH (SUTURE) ×3 IMPLANT
SUT SILK 3 0 (SUTURE) ×3
SUT SILK 3-0 FS1 18XBRD (SUTURE) IMPLANT
SUT VIC AB 0 CT1 36 (SUTURE) ×3 IMPLANT
SUT VIC AB 0 CTX 36 (SUTURE) ×12
SUT VIC AB 0 CTX36XBRD ANBCTRL (SUTURE) ×3 IMPLANT
SUT VIC AB 3-0 CT1 27 (SUTURE) ×3
SUT VIC AB 3-0 CT1 TAPERPNT 27 (SUTURE) IMPLANT
TOWEL OR 17X24 6PK STRL BLUE (TOWEL DISPOSABLE) ×5 IMPLANT
TRAY FOLEY CATH 14FR (SET/KITS/TRAYS/PACK) ×3 IMPLANT

## 2014-03-05 NOTE — Anesthesia Procedure Notes (Signed)
Spinal Patient location during procedure: OR Start time: 03/05/2014 2:35 PM End time: 03/05/2014 2:37 PM Staffing Anesthesiologist: Leilani AbleHATCHETT, Fahed Morten Performed by: anesthesiologist  Preanesthetic Checklist Completed: patient identified, surgical consent, pre-op evaluation, timeout performed, IV checked, risks and benefits discussed and monitors and equipment checked Spinal Block Patient position: sitting Prep: DuraPrep Patient monitoring: heart rate, cardiac monitor, continuous pulse ox and blood pressure Approach: midline Location: L3-4 Injection technique: single-shot Needle Needle type: Pencan  Needle gauge: 24 G Needle length: 9 cm Needle insertion depth: 9 cm Assessment Sensory level: T4

## 2014-03-05 NOTE — Transfer of Care (Signed)
Immediate Anesthesia Transfer of Care Note  Patient: [name]  Procedure(s) Performed: Procedure(s): CESAREAN SECTION (N/A)  Patient Location: PACU  Anesthesia Type:Spinal  Level of Consciousness: awake, alert  and oriented  Airway & Oxygen Therapy: Patient Spontanous Breathing  Post-op Assessment: Report given to RN and Post -op Vital signs reviewed and stable  Post vital signs: Reviewed and stable  Last Vitals:  Filed Vitals:   03/05/14 1227  BP: 123/70  Pulse: 76  Temp: 37.2 C  Resp: 16    Complications: No apparent anesthesia complications

## 2014-03-05 NOTE — Anesthesia Preprocedure Evaluation (Signed)
Anesthesia Evaluation  Patient identified by MRN, date of birth, ID band Patient awake    Reviewed: Allergy & Precautions, H&P , NPO status , Patient's Chart, lab work & pertinent test results  Airway Mallampati: I  TM Distance: >3 FB Neck ROM: full    Dental no notable dental hx.    Pulmonary former smoker,    Pulmonary exam normal       Cardiovascular negative cardio ROS      Neuro/Psych negative psych ROS   GI/Hepatic negative GI ROS, Neg liver ROS,   Endo/Other  Morbid obesity  Renal/GU negative Renal ROS     Musculoskeletal   Abdominal (+) + obese,   Peds  Hematology negative hematology ROS (+)   Anesthesia Other Findings   Reproductive/Obstetrics (+) Pregnancy                             Anesthesia Physical Anesthesia Plan  ASA: III  Anesthesia Plan: Spinal   Post-op Pain Management:    Induction:   Airway Management Planned:   Additional Equipment:   Intra-op Plan:   Post-operative Plan:   Informed Consent: I have reviewed the patients History and Physical, chart, labs and discussed the procedure including the risks, benefits and alternatives for the proposed anesthesia with the patient or authorized representative who has indicated his/her understanding and acceptance.     Plan Discussed with: CRNA and Surgeon  Anesthesia Plan Comments:         Anesthesia Quick Evaluation

## 2014-03-05 NOTE — Op Note (Signed)
Cesarean Section Procedure Note  Indications: 1.39wks 2.h/o c-section   Pre-operative Diagnosis: Repeat Cesarean Section   Post-operative Diagnosis: Repeat Cesarean Section  Procedure: REPEAT CESAREAN SECTION  Surgeon: Purcell Nails, MD    Assistants: Gerrit Heck, CNM  Anesthesia: Spinal  Anesthesiologist: Leilani Able, MD   Procedure Details  The patient was taken to the operating room secondary to h/o c-section scheduled for repeat c-section after the risks, benefits, complications, treatment options, and expected outcomes were discussed with the patient.  The patient concurred with the proposed plan, giving informed consent which was signed and witnessed. The patient was taken to Operating Room C-Section Suite, identified as [name] and the procedure verified as C-Section Delivery. A Time Out was held and the above information confirmed.  After induction of anesthesia by obtaining a spinal, the patient was prepped and draped in the usual sterile manner. A Pfannenstiel skin incision was made and carried down through the subcutaneous tissue to the underlying layer of fascia.  The fascia was incised bilaterally and extended transversely bilaterally with the Mayo scissors. Kocher clamps were placed on the inferior aspect of the fascial incision and the underlying rectus muscle was separated from the fascia. The same was done on the superior aspect of the fascial incision.  The peritoneum was identified, entered bluntly and extended manually after clamping and cutting the omentum which was scarred to the anterior abdominal wall.  An Alexis self-retaining retractor was placed.  The utero-vesical peritoneal reflection was incised transversely and the bladder flap was bluntly freed from the lower uterine segment. A low transverse uterine incision was made with the scalpel and extended bilaterally with the bandage scissors.  The infant was delivered in vertex position without  difficulty.  After the umbilical cord was clamped and cut, the infant was handed to the awaiting pediatricians.  Cord blood was obtained for evaluation.  The placenta was removed intact and appeared to be within normal limits. The uterus was cleared of all clots and debris. The uterine incision was closed with running interlocking sutures of 0 Vicryl and a second imbricating layer was performed as well.   Bilateral tubes and ovaries appeared to be within normal limits.  Good hemostasis was noted.  Copious irrigation was performed until clear.  The peritoneum was repaired with 2-0 chromic via a running suture.  The fascia was reapproximated with a running suture of 0 Vicryl. A size 10 JP drain was placed in the subcutaneous tissue.  The subcutaneous tissue was reapproximated with 3 interrupted sutures of 2-0 plain.  The skin was reapproximated with a subcuticular suture of 3-0 monocryl.  Steristrips were applied.  Instrument, sponge, and needle counts were correct prior to abdominal closure and at the conclusion of the case.  The patient was awaiting transfer to the recovery room in good condition.  Findings: Live female infant with Apgars 8 at one minute and 9 at five minutes.  Normal appearing bilateral ovaries and fallopian tubes were noted.  Estimated Blood Loss:  750 ml         Drains: foley to gravity with 200 cc urine         Total IV Fluids: 3700 ml         Specimens to Pathology: Placenta         Complications:  None; patient tolerated the procedure well.         Disposition: PACU - hemodynamically stable.         Condition: stable  Attending Attestation:  I performed the procedure.

## 2014-03-05 NOTE — Anesthesia Postprocedure Evaluation (Signed)
Anesthesia Post Note  Patient: [name]  Procedure(s) Performed: Procedure(s) (LRB): CESAREAN SECTION (N/A)  Anesthesia type: Spinal  Patient location: PACU  Post pain: Pain level controlled  Post assessment: Post-op Vital signs reviewed  Last Vitals:  Filed Vitals:   03/05/14 1700  BP:   Pulse:   Temp:   Resp: 20    Post vital signs: Reviewed  Level of consciousness: awake  Complications: No apparent anesthesia complications

## 2014-03-06 LAB — CBC
HEMATOCRIT: 29.1 % — AB (ref 36.0–46.0)
Hemoglobin: 9.6 g/dL — ABNORMAL LOW (ref 12.0–15.0)
MCH: 26.3 pg (ref 26.0–34.0)
MCHC: 33 g/dL (ref 30.0–36.0)
MCV: 79.7 fL (ref 78.0–100.0)
Platelets: 215 10*3/uL (ref 150–400)
RBC: 3.65 MIL/uL — ABNORMAL LOW (ref 3.87–5.11)
RDW: 16.8 % — AB (ref 11.5–15.5)
WBC: 9.4 10*3/uL (ref 4.0–10.5)

## 2014-03-06 NOTE — Addendum Note (Signed)
Addendum  created 03/06/14 1527 by Renford DillsJanet L Lucero Auzenne, CRNA   Modules edited: Notes Section   Notes Section:  File: [id number]

## 2014-03-06 NOTE — Anesthesia Postprocedure Evaluation (Signed)
  Anesthesia Post-op Note  Patient: [name]  Procedure(s) Performed: Procedure(s): CESAREAN SECTION (N/A)  Patient Location: Mother/Baby  Anesthesia Type:Spinal  Level of Consciousness: awake  Airway and Oxygen Therapy: Patient Spontanous Breathing  Post-op Pain: mild  Post-op Assessment: Patient's Cardiovascular Status Stable and Respiratory Function Stable  Post-op Vital Signs: stable  Last Vitals:  Filed Vitals:   03/06/14 1421  BP: 102/70  Pulse: 79  Temp: 36.9 C  Resp: 18    Complications: No apparent anesthesia complications

## 2014-03-06 NOTE — Lactation Note (Signed)
This note was copied from the chart of Girl Ryerson IncEnchon Patry. Lactation Consultation Note  Patient Name: Girl Bretta Bangnchon Macaluso ZOXWR'UToday's Date: 03/06/2014 Reason for consult: Initial assessment Mom and baby, full ter, are now 20 hours post partum. Mom was doing skin to skin after baby's bath. Mom reports breast feeding going well. Mom has large, pendulous breasts. Mom has seen colostrum from her left breast.MOm has been using football hold, and I explained to mom how to position her pillows for the hold. I did breast feeding teaching from the baby and Me bbk, and also reviewed lactation services. Mom knows to call for questions/concerns.   Maternal Data Formula Feeding for Exclusion: No Has patient been taught Hand Expression?: Yes Does the patient have breastfeeding experience prior to this delivery?: Yes  Feeding    LATCH Score/Interventions                      Lactation Tools Discussed/Used     Consult Status Consult Status: Follow-up Date: 03/07/14 Follow-up type: In-patient    Alfred LevinsLee, Quanisha Drewry Anne 03/06/2014, 11:53 AM

## 2014-03-06 NOTE — Progress Notes (Addendum)
[name] [id number] Postpartum Day 1 S/P Repeat Scheduled Elective  Cesarean Section  Subjective: Patient denies syncope or dizziness. Reports consuming regular diet without issues and denies N/V. Patient reports negative bowel movement and is not passing flatus, but states "I feel it their."  Patient is breastfeeding and reports going well.  Desires birth control for postpartum contraception.  Pain is being appropriately managed with use of ibuprofen.   Objective: Temp:  [97.7 F (36.5 C)-99.1 F (37.3 C)] 97.9 F (36.6 C) (02/20 0600) Pulse Rate:  [60-83] 70 (02/20 0600) Resp:  [16-20] 18 (02/20 0600) BP: (105-129)/(49-73) 105/58 mmHg (02/20 0600) SpO2:  [94 %-100 %] 96 % (02/20 0600) Weight:  [264 lb 2 oz (119.806 kg)] 264 lb 2 oz (119.806 kg) (02/19 1227)   Recent Labs  03/04/14 1248 03/06/14 0615  HGB 10.6* 9.6*  HCT 32.7* 29.1*  WBC 8.8 9.4    Physical Exam:  General: alert, cooperative and no distress Mood/Affect: Appropriate Lungs: clear to auscultation, no wheezes, rales or rhonchi, symmetric air entry.  Heart: normal rate and regular rhythm. Abdomen:  + bowel sounds, Soft, Appropriately Tender, Pressure Dressing removed Incision: Drainage noted on Honeycomb dressing  Uterine Fundus: firm at U/-1 Lochia: appropriate Skin: Warm Dry. DVT Evaluation: No evidence of DVT seen on physical exam. No significant calf/ankle edema. JP drain:   25mL of blood noted  Assessment Post Operative Day 1 S/P Repeat C/S Normal Involution Breast Feeding Hemodynamically Stable  Plan: Remove foley catheter Promote ambulation Continue to manage pain with use of ibuprofen and percocet as necessary Educated on usage of POPs Lactation Consult Continue other mgmt as ordered Dr. Lance MorinA. Roberts to be updated on patient status  Marlene BastMLY, Jeremi Losito LYNN MSN, CNM 03/06/2014, 9:10 AM

## 2014-03-07 MED ORDER — FERROUS SULFATE 325 (65 FE) MG PO TABS
325.0000 mg | ORAL_TABLET | Freq: Two times a day (BID) | ORAL | Status: DC
  Administered 2014-03-07 – 2014-03-08 (×2): 325 mg via ORAL
  Filled 2014-03-07 (×2): qty 1

## 2014-03-07 MED ORDER — BISACODYL 10 MG RE SUPP
10.0000 mg | Freq: Once | RECTAL | Status: AC
  Administered 2014-03-07: 10 mg via RECTAL
  Filled 2014-03-07: qty 1

## 2014-03-07 NOTE — Progress Notes (Signed)
Subjective: Postpartum Day 2: Cesarean Delivery due to repeat Patient up ad lib, reports no syncope or dizziness. Feeding:  breastfeeding Contraceptive plan:  unsure  Objective: Vital signs in last 24 hours: Temp:  [98.1 F (36.7 C)-98.8 F (37.1 C)] 98.1 F (36.7 C) (02/21 0454) Pulse Rate:  [65-79] 73 (02/21 0454) Resp:  [18] 18 (02/21 0454) BP: (97-103)/(51-70) 97/51 mmHg (02/21 0454) SpO2:  [97 %] 97 % (02/20 1421)  Physical Exam:  General: alert and cooperative Lochia: appropriate Uterine Fundus: firm Abdomen:  + bowel sounds, non distended Incision:  Honeycomb dressing soiled with old dark blood DVT Evaluation: No evidence of DVT seen on physical exam. Homan's sign: Negative   Recent Labs  03/04/14 1248 03/06/14 0615  HGB 10.6* 9.6*  HCT 32.7* 29.1*  WBC 8.8 9.4     Assessment: Status post Cesarean section day 2. Doing well postoperatively.  Honeycomb dressing in place, no significant active drainage JP drain in place, no significant drainage Anemia - hemodynamicly stable.   Plan: Continue current care. Plan for discharge tomorrow, Breastfeeding and Lactation consult Change honeycomb dressing Iron supplement   Grayer Sproles, CNM, MSN 03/07/2014. 11:38 AM

## 2014-03-07 NOTE — Lactation Note (Signed)
This note was copied from the chart of [name]. Lactation Consultation Note  Patient Name: [name] HYQMV'HToday's Date: 03/07/2014 Reason for consult: Follow-up assessment Baby 55 hours of life. [facility]. Enc mom to hold baby tight into breast, with cheeks touching breast. Baby suckling rhythmically with a few swallows noted. Mom states that left breast not "working as well." Mom's nipple on left breast is more inverted. Mom states that with her other children the latch is always better after her milk comes in. Enc mom to continue to latch to left breast, alternating which side she starts breastfeeding on to make sure she has good supply with both breast. Mom states that most of the breastfeeds last 20-25 minutes. Enc mom to call out for assistance as needed.   Maternal Data    Feeding Feeding Type: Breast Fed Length of feed: 30 min  LATCH Score/Interventions Latch: Grasps breast easily, tongue down, lips flanged, rhythmical sucking. Intervention(s): Skin to skin  Audible Swallowing: A few with stimulation  Type of Nipple: Everted at rest and after stimulation  Comfort (Breast/Nipple): Soft / non-tender     Hold (Positioning): No assistance needed to correctly position infant at breast.  LATCH Score: 9  Lactation Tools Discussed/Used     Consult Status Consult Status: PRN    Geralynn OchsWILLIARD, Daniyal Tabor 03/07/2014, 10:51 PM

## 2014-03-08 ENCOUNTER — Encounter (HOSPITAL_COMMUNITY): Payer: Self-pay | Admitting: Obstetrics and Gynecology

## 2014-03-08 MED ORDER — IBUPROFEN 600 MG PO TABS: 600.0000 mg | ORAL_TABLET | Freq: Four times a day (QID) | ORAL | Status: DC | PRN

## 2014-03-08 MED ORDER — OXYCODONE-ACETAMINOPHEN 5-325 MG PO TABS: 1.0000 | ORAL_TABLET | Freq: Four times a day (QID) | ORAL | Status: DC | PRN

## 2014-03-08 MED ORDER — NORETHINDRONE 0.35 MG PO TABS: 1.0000 | ORAL_TABLET | Freq: Every day | ORAL | Status: DC

## 2014-03-08 MED ORDER — FERROUS SULFATE 325 (65 FE) MG PO TABS: 325.0000 mg | ORAL_TABLET | Freq: Every day | ORAL | Status: DC

## 2014-03-08 NOTE — Progress Notes (Signed)
Ur chart review completed.  

## 2014-03-08 NOTE — Discharge Instructions (Signed)
Oral Contraception Information Oral contraceptive pills (OCPs) are medicines taken to prevent pregnancy. OCPs work by preventing the ovaries from releasing eggs. The hormones in OCPs also cause the cervical mucus to thicken, preventing the sperm from entering the uterus. The hormones also cause the uterine lining to become thin, not allowing a fertilized egg to attach to the inside of the uterus. OCPs are highly effective when taken exactly as prescribed. However, OCPs do not prevent sexually transmitted diseases (STDs). Safe sex practices, such as using condoms along with the pill, can help prevent STDs.  Before taking the pill, you may have a physical exam and Pap test. Your health care provider may order blood tests. The health care provider will make sure you are a good candidate for oral contraception. Discuss with your health care provider the possible side effects of the OCP you may be prescribed. When starting an OCP, it can take 2 to 3 months for the body to adjust to the changes in hormone levels in your body.  TYPES OF ORAL CONTRACEPTION  The combination pill--This pill contains estrogen and progestin (synthetic progesterone) hormones. The combination pill comes in 21-day, 28-day, or 91-day packs. Some types of combination pills are meant to be taken continuously (365-day pills). With 21-day packs, you do not take pills for 7 days after the last pill. With 28-day packs, the pill is taken every day. The last 7 pills are without hormones. Certain types of pills have more than 21 hormone-containing pills. With 91-day packs, the first 84 pills contain both hormones, and the last 7 pills contain no hormones or contain estrogen only.  The minipill--This pill contains the progesterone hormone only. The pill is taken every day continuously. It is very important to take the pill at the same time each day. If the pill is taken more than 2 hours late, back up contraceptive methods are necessary (i.e. Condoms,  spermicide, etc). The minipill comes in packs of 28 pills. All 28 pills contain the hormone.  ADVANTAGES OF ORAL CONTRACEPTIVE PILLS  Decreases premenstrual symptoms.   Treats menstrual period cramps.   Regulates the menstrual cycle.   Decreases a heavy menstrual flow.   May treatacne, depending on the type of pill.   Treats abnormal uterine bleeding.   Treats polycystic ovarian syndrome.   Treats endometriosis.   Can be used as emergency contraception.  THINGS THAT CAN MAKE ORAL CONTRACEPTIVE PILLS LESS EFFECTIVE OCPs can be less effective if:   You forget to take the pill at the same time every day.   You have a stomach or intestinal disease that lessens the absorption of the pill.   You take OCPs with other medicines that make OCPs less effective, such as antibiotics, certain HIV medicines, and some seizure medicines.   You take expired OCPs.   You forget to restart the pill on day 7, when using the packs of 21 pills.  RISKS ASSOCIATED WITH ORAL CONTRACEPTIVE PILLS  Oral contraceptive pills can sometimes cause side effects, such as:  Headache.  Nausea.  Breast tenderness.  Irregular bleeding or spotting. Combination pills are also associated with a small increased risk of:  Blood clots.  Heart attack.  Stroke. Document Released: 03/24/2002 Document Revised: 10/22/2012 Document Reviewed: 06/22/2012 [facility] Patient Information 2015 [place]. This information is not intended to replace advice given to you by your health care provider. Make sure you discuss any questions you have with your health care provider. Postpartum Depression and Baby Blues The postpartum period  begins right after the birth of a baby. During this time, there is often a great amount of joy and excitement. It is also a time of many changes in the life of the parents. Regardless of how many times a mother gives birth, each child brings new challenges and dynamics to the  family. It is not unusual to have feelings of excitement along with confusing shifts in moods, emotions, and thoughts. All mothers are at risk of developing postpartum depression or the "baby blues." These mood changes can occur right after giving birth, or they may occur many months after giving birth. The baby blues or postpartum depression can be mild or severe. Additionally, postpartum depression can go away rather quickly, or it can be a long-term condition.  CAUSES Raised hormone levels and the rapid drop in those levels are thought to be a main cause of postpartum depression and the baby blues. A number of hormones change during and after pregnancy. Estrogen and progesterone usually decrease right after the delivery of your baby. The levels of thyroid hormone and various cortisol steroids also rapidly drop. Other factors that play a role in these mood changes include major life events and genetics.  RISK FACTORS If you have any of the following risks for the baby blues or postpartum depression, know what symptoms to watch out for during the postpartum period. Risk factors that may increase the likelihood of getting the baby blues or postpartum depression include:  Having a personal or family history of depression.   Having depression while being pregnant.   Having premenstrual mood issues or mood issues related to oral contraceptives.  Having a lot of life stress.   Having marital conflict.   Lacking a social support network.   Having a baby with special needs.   Having health problems, such as diabetes.  SIGNS AND SYMPTOMS Symptoms of baby blues include:  Brief changes in mood, such as going from extreme happiness to sadness.  Decreased concentration.   Difficulty sleeping.   Crying spells, tearfulness.   Irritability.   Anxiety.  Symptoms of postpartum depression typically begin within the first month after giving birth. These symptoms include:  Difficulty  sleeping or excessive sleepiness.   Marked weight loss.   Agitation.   Feelings of worthlessness.   Lack of interest in activity or food.  Postpartum psychosis is a very serious condition and can be dangerous. Fortunately, it is rare. Displaying any of the following symptoms is cause for immediate medical attention. Symptoms of postpartum psychosis include:   Hallucinations and delusions.   Bizarre or disorganized behavior.   Confusion or disorientation.  DIAGNOSIS  A diagnosis is made by an evaluation of your symptoms. There are no medical or lab tests that lead to a diagnosis, but there are various questionnaires that a health care provider may use to identify those with the baby blues, postpartum depression, or psychosis. Often, a screening tool called the [place] Postnatal Depression Scale is used to diagnose depression in the postpartum period.  TREATMENT The baby blues usually goes away on its own in 1-2 weeks. Social support is often all that is needed. You will be encouraged to get adequate sleep and rest. Occasionally, you may be given medicines to help you sleep.  Postpartum depression requires treatment because it can last several months or longer if it is not treated. Treatment may include individual or group therapy, medicine, or both to address any social, physiological, and psychological factors that may play a role in  the depression. Regular exercise, a healthy diet, rest, and social support may also be strongly recommended.  Postpartum psychosis is more serious and needs treatment right away. Hospitalization is often needed. HOME CARE INSTRUCTIONS  Get as much rest as you can. Nap when the baby sleeps.   Exercise regularly. Some women find yoga and walking to be beneficial.   Eat a balanced and nourishing diet.   Do little things that you enjoy. Have a cup of tea, take a bubble bath, read your favorite magazine, or listen to your favorite music.  Avoid  alcohol.   Ask for help with household chores, cooking, grocery shopping, or running errands as needed. Do not try to do everything.   Talk to people close to you about how you are feeling. Get support from your partner, family members, friends, or other new moms.  Try to stay positive in how you think. Think about the things you are grateful for.   Do not spend a lot of time alone.   Only take over-the-counter or prescription medicine as directed by your health care provider.  Keep all your postpartum appointments.   Let your health care provider know if you have any concerns.  SEEK MEDICAL CARE IF: You are having a reaction to or problems with your medicine. SEEK IMMEDIATE MEDICAL CARE IF:  You have suicidal feelings.   You think you may harm the baby or someone else. MAKE SURE YOU:  Understand these instructions.  Will watch your condition.  Will get help right away if you are not doing well or get worse. Document Released: 10/06/2003 Document Revised: 01/06/2013 Document Reviewed: 10/13/2012 [facility] Patient Information 2015 [place], [place]. This information is not intended to replace advice given to you by your health care provider. Make sure you discuss any questions you have with your health care provider.

## 2014-03-08 NOTE — Lactation Note (Signed)
This note was copied from the chart of [name]. Lactation Consultation Note  Patient Name: Cynthia Bretta Bangnchon Tortora AVWUJ'WToday's Date: 03/08/2014 Reason for consult: Follow-up assessment  Baby is 5265  hours old @5  % weight loss ( 6-3.5 oz ) presently latched on the right breast with depth and multiply swallows noted , increased with breast compressions. Per mom comfortable. Voids and stools adequate, Bili check 10.7. Per mom still not able to latch on the left breast due to swollen areola. LC assessed with moms permission  And note the tissue to be swollen and tough. Per mom this same thing happened with mom other baby and eventually worked out once the swelling went down and then I was able to latch. LC recommended raising the breast on a pillow , also breast massage , hand express, pre-pump and then reverse pressure . LC found rasing it on a pillow did help., areola soften alittle  And areola more compressible. Mom was sensitive to reverse pressure. Shells given with instruction and increased flange for hand pump. Per mom has a DEBP at home.( [employer]in[employer] )  Sore nipple and engorgement prevention and tx reviewed , referring to the Baby and me booklet, pages 24 -25.  Mother informed of post-discharge support and given phone number to the lactation department, including services for phone call assistance; out-patient appointments; and breastfeeding support group. List of other breastfeeding resources in the community given in the handout. Encouraged mother to call for problems or concerns related to breastfeeding.   Maternal Data    Feeding Feeding Type:  (baby latched at 0830  per mom ) Length of feed:  (still feeding at 27 mins , multiply swallows )  LATCH Score/Interventions Latch:  (latched with depth ) Intervention(s):  (encouraged skin to  ksin feedings until for the 1st 2 weeks )  Audible Swallowing:  (multiply swallows noted, increased with breast compressions )     Comfort  (Breast/Nipple):  (per mom comfortable )     Hold (Positioning):  (mom independent with latch ) Intervention(s): Breastfeeding basics reviewed;Support Pillows (see LC note )     Lactation Tools Discussed/Used Pump Review: Milk Storage   Consult Status Consult Status: Complete Date: 03/08/14    Kathrin Greathouseorio, Chelcy Bolda Ann 03/08/2014, 9:05 AM

## 2014-03-08 NOTE — Discharge Summary (Signed)
Cesarean Section Delivery Discharge Summary  [name]  DOB:    Jul 10, 1980 MRN:    [id number] CSN:    [id number]  Date of admission:                  Mar 05, 2014  Date of discharge:                   Mar 08, 2014  Procedures this admission: Scheduled Repeat C/S  Date of Delivery: Mar 05, 2014  Newborn Data:  Live born female  Birth Weight: 6 lb 8.4 oz (2960 g) APGAR: 8, 9  Home with mother. Name: [name] Circumcision Plan: None  History of Present Illness:  [name] is a 34 y.o. female, [phone], who presents at 4657w0d weeks gestation. [facility]    Her pregnancy has been complicated by:  Patient Active Problem List   Diagnosis Date Noted  . S/P repeat low transverse C-section 03/05/2014  . Coccyx pain 01/03/2014  . Right shoulder pain 07/24/2013  . Pes planus 07/10/2012  . Plantar fasciitis, bilateral 07/10/2012  . Supervision of other normal pregnancy 04/18/2012  . Vaginal discharge 04/18/2012  . Infection of urinary tract 04/16/2012  . Positive pregnancy test 01/24/2012  . Drug abuse, marijuana 01/20/2011  . Severe persistent asthma 12/28/2009  . CONSTIPATION, CHRONIC 09/10/2008  . COMMON MIGRAINE 03/20/2007  . OBESITY NOS 05/24/2006  . RHINITIS, ALLERGIC 03/14/2006  . GASTROESOPHAGEAL REFLUX, NO ESOPHAGITIS 03/14/2006   none.  Hospital Course--Scheduled Cesarean: Patient was admitted on Friday 2/19, 2016 for a scheduled repeat cesarean delivery.   She was taken to the operating room, where Dr. Lance MorinA. Roberts performed a repeat LTCS under spinal anesthesia, with delivery of a viable female, with weight and Apgars as listed below. Infant was in good condition and remained at the patient's bedside.  The patient was taken to recovery in good condition.  Patient planned to breast feed.  On post-op day 1, patient was doing well, tolerating a regular  diet, with Hgb of 9.6.  Throughout her stay, her physical exam was WNL, her incision was CDI, and her vital signs remained stable.  By post-op day 3, she was up ad lib, tolerating a regular diet, with good pain control with po med.  She was deemed to have received the full benefit of her hospital stay, JP drain was removed, and was discharged home in stable condition.  Contraceptive choice was OCPs.  Patient was sent home with RX to start in 4 weeks with instructions.  Feeding:  breast  Contraception:  oral progesterone-only contraceptive  Discharge hemoglobin:  HEMOGLOBIN  Date Value Ref Range Status  03/06/2014 9.6* 12.0 - 15.0 g/dL Final  96/29/528402/18/2016 13.210.6* 12.0 - 15.0 g/dL Final  44/01/027210/12/2012 9.6* 12.0 - 15.0 g/dL Final   HCT  Date Value Ref Range Status  03/06/2014 29.1* 36.0 - 46.0 % Final  03/04/2014 32.7* 36.0 - 46.0 % Final  10/26/2012 28.9* 36.0 - 46.0 % Final   WBC  Date Value Ref Range Status  03/06/2014 9.4 4.0 - 10.5 K/uL Final  03/04/2014 8.8 4.0 - 10.5 K/uL Final  10/26/2012 11.3* 4.0 - 10.5 K/uL Final    Discharge Physical Exam:   General: alert, cooperative and no distress  Chest: Lungs CTA, Heart RRR Abdomen:  + bowel sounds, Soft, Appropriately Tender Lochia: appropriate Uterine Fundus: firm, U/-2 Incision: healing well, steri strips  in place--clean dry DVT Evaluation: No evidence of DVT seen on physical exam. No significant calf/ankle edema.  Intrapartum Procedures: spontaneous vaginal delivery Postpartum Procedures: none Complications-Operative and Postpartum: none  Discharge Diagnoses: Term Pregnancy-delivered  Discharge Information:  Activity:           pelvic rest Diet:                routine Medications: PNV, Ibuprofen, Iron and Percocet Condition:      stable Instructions: Incision care, Postpartum Depression, Activity Restrictions, Breastfeeding, Pain Mgmt, Who and when to call for PP Concerns, Follow Up Appointment, Information Sheet  Given Oral Contraception & Postpartum Depression and Baby Blues   Discharge to: home  Follow-up Information    Follow up with [facility]. Schedule an appointment as soon as possible for a visit in 6 weeks.   Specialty:  Obstetrics and Gynecology   Why:  Please call if you have any questions or concerns, prior to your next visit. Remove steri-strips in 7 days if applicable.   Contact information:   [address] [place] [address] [phone]       Marlene Bast , MSN, CNM 03/08/2014 8:15 AM

## 2014-04-19 DIAGNOSIS — Z862 Personal history of diseases of the blood and blood-forming organs and certain disorders involving the immune mechanism: Secondary | ICD-10-CM | POA: Diagnosis not present

## 2014-06-25 ENCOUNTER — Telehealth: Payer: Self-pay | Admitting: *Deleted

## 2014-06-25 ENCOUNTER — Other Ambulatory Visit: Payer: Self-pay | Admitting: Family Medicine

## 2014-06-25 NOTE — Telephone Encounter (Signed)
Spoke with patient, she states she is breast feeding. If this is unsafe Patient states she would like an alternative sent in until she can get in for an appointment.

## 2014-06-25 NOTE — Telephone Encounter (Signed)
I am covering for Dr Pollie Meyer. Let patient know that I am happy to refill symbicort but just need to know if she is breastfeeding (since I do not know the patient) before I refill. Also, she is overdue for follow up. Once I know this, I can fill 1 month of medication.  Cynthia Singleton, MD

## 2014-06-28 ENCOUNTER — Other Ambulatory Visit: Payer: Self-pay | Admitting: *Deleted

## 2014-06-28 NOTE — Telephone Encounter (Signed)
Spoke with pharmacy. This medication has no human studies and it is deemed likely safe for breastfeeding. Pt states she stopped taking it for 2-3 days waiting to hear back, and in that time asthma symptoms have mildly flared so she prefers to go back on it. I told her this is a good idea since asthma flare can be dangerous. Refilled and pt to restart daily use, and notify if any concerns/abnormalities in her daughter which would be unlikely. Also urged her to make f/u appt with PCP.   Leona Singleton, MD

## 2014-06-28 NOTE — Telephone Encounter (Signed)
Thank you - also spoke with her today. See my phone note.  Leona Singleton, MD

## 2014-07-13 ENCOUNTER — Encounter: Payer: Self-pay | Admitting: Family Medicine

## 2014-07-13 ENCOUNTER — Ambulatory Visit (INDEPENDENT_AMBULATORY_CARE_PROVIDER_SITE_OTHER): Payer: Medicare Other | Admitting: Family Medicine

## 2014-07-13 VITALS — BP 137/103 | HR 71 | Temp 98.5°F | Ht 65.0 in | Wt 259.0 lb

## 2014-07-13 DIAGNOSIS — N62 Hypertrophy of breast: Secondary | ICD-10-CM | POA: Diagnosis not present

## 2014-07-13 DIAGNOSIS — J4551 Severe persistent asthma with (acute) exacerbation: Secondary | ICD-10-CM | POA: Diagnosis not present

## 2014-07-13 MED ORDER — PREDNISONE 50 MG PO TABS: 50.0000 mg | ORAL_TABLET | Freq: Every day | ORAL | Status: DC

## 2014-07-13 NOTE — Assessment & Plan Note (Signed)
Flare currently. Will rx prednisone  daily for 5 days (take prednisone immediately after breastfeeding and wait at least 4 hours prior to next feeding session to minimize infant exposure). Continue albuterol as needed. F/u if not improving in next several days.

## 2014-07-13 NOTE — Progress Notes (Signed)
Patient ID: [name], female   DOB: 23-Jun-1980, 34 y.o.   MRN: [id number]  HPI:  Large breasts: desires breast reduction. Wears size 44H bra. Has lots of pain in shoulders and back from how large her breasts are. She is currently breastfeeding but is willing to stop this if she can get reduction done. Pain from large breasts has been a problem for years, regardless of whether she is breastfeeding.  Asthma: has had mild flare for past 3 days. Using albuterol BID for 3 days. Does symbicort BID. Has noticed herself wheezing.  ROS: See HPI.  PMFSH: hx migraine, constipation, GERD, asthma, allergic rhinitis, currently breastfeeding  PHYSICAL EXAM: BP 137/103 mmHg  Pulse 71  Temp(Src) 98.5 F (36.9 C) (Oral)  Ht 5\' 5"  (1.651 m)  Wt 259 lb (117.482 kg)  BMI 43.10 kg/m2  O2 sat 96% and above while ambulating on room air Gen: NAD  HEENT: NCAT, visualized TM's clear bilat (although L TM slightly obscured by cerumen), oropharynx clear and moist, no anterior cervical LAD Heart: RRR no murmur Lungs: NWOB, speaks in full sentences without distress. Good air movement but diffuse expiratory wheezing throughout Neuro: grossly nonfocal speech normal Ext: atraumatic Breasts: large pendulous breasts present. Back nontender to palpation.  ASSESSMENT/PLAN:  Severe persistent asthma Flare currently. Will rx prednisone 50mg  daily for 5 days (take prednisone immediately after breastfeeding and wait at least 4 hours prior to next feeding session to minimize infant exposure). Continue albuterol as needed. F/u if not improving in next several days.  Large breasts Pt desires breast reduction. I agree that she stands to have significant improvement in quality of life if she undergoes reduction. Will refer to plastic surgery.   FOLLOW UP: F/u as needed if symptoms worsen or do not improve for asthma Referring to plastic surgery for consideration of breast reduction.  GrenadaBrittany J. Pollie MeyerMcIntyre, MD [facility]

## 2014-07-13 NOTE — Patient Instructions (Signed)
For asthma flare: take prednisone 50mg  daily Take immediately after breastfeeding, wait 4 hours before feeding again Return if not improving within next couple of days Continue albuterol as needed  For breast reduction: will refer to plastic surgery  Return in 2-3 weeks for nurse blood pressure check since BP is elevated.  Be well, Dr. Pollie MeyerMcIntyre

## 2014-07-13 NOTE — Assessment & Plan Note (Signed)
Pt desires breast reduction. I agree that she stands to have significant improvement in quality of life if she undergoes reduction. Will refer to plastic surgery.

## 2014-08-11 DIAGNOSIS — M542 Cervicalgia: Secondary | ICD-10-CM | POA: Diagnosis not present

## 2014-08-11 DIAGNOSIS — N62 Hypertrophy of breast: Secondary | ICD-10-CM | POA: Diagnosis not present

## 2014-08-11 DIAGNOSIS — M546 Pain in thoracic spine: Secondary | ICD-10-CM | POA: Diagnosis not present

## 2014-08-17 ENCOUNTER — Ambulatory Visit (INDEPENDENT_AMBULATORY_CARE_PROVIDER_SITE_OTHER): Payer: Medicare Other | Admitting: Family Medicine

## 2014-08-17 ENCOUNTER — Encounter: Payer: Self-pay | Admitting: Family Medicine

## 2014-08-17 DIAGNOSIS — R635 Abnormal weight gain: Secondary | ICD-10-CM | POA: Diagnosis not present

## 2014-08-17 NOTE — Patient Instructions (Signed)
For weight management: Call Dr. Gerilyn Pilgrim (our nutritionist) to set up an appointment. Her phone number is: [phone].  Start working out with a Systems analyst  On your way out, schedule an appointment one morning to come back for fasting labs. Do not eat or drink anything other than water the morning of your lab appointment until after your labs are drawn.   Follow up with me in 1 month  Be well, Dr. Pollie Meyer

## 2014-08-17 NOTE — Progress Notes (Signed)
Patient ID: [name], female   DOB: [birth year], 34 y.o.   MRN: [id number]  HPI:  Weight loss: Patient was recently seen by a plastic surgeon in consultation for a breast reduction. The surgeon agreed that she would need a breast reduction, but told her she needs to lose 60-80 pounds in order to be a candidate for the surgery. Surgeon recommended she follow-up with her PCP to discuss a plan for weight loss, and possible weight loss medication. Patient reports she has worked on weight loss in the past without much success. She does not exercise regularly. She has started watching what she eats and is avoiding sodas. She is amenable to seeing a nutritionist. She does have a history of severe asthma, for which she is on disability. She does report that she is able to exercise despite his asthma. Denies any chest pain.  ROS: See HPI.  PMFSH: History of migraine, marijuana use, GERD, severe persistent asthma  PHYSICAL EXAM: BP 144/88 mmHg  Pulse 71  Temp(Src) 98.5 F (36.9 C) (Oral)  Ht  (1.651 m)  Wt 261 lb 6.4 oz (118.57 kg)  BMI 43.50 kg/m2 Gen: NAD, pleasant, cooperative HEENT: NCAT Heart: RRR no murmur Lungs: CTAB in general with one mild expiratory wheeze in LUL. NWOB Neuro: grossly nonfocal, speech normal Ext: atraumatic  ASSESSMENT/PLAN:  Morbid obesity Patient currently very motivated to lose weight, so that she'll be a candidate for breast reduction. We discussed weight loss tactics today. Plan: -schedule an appointment with our nutritionist Dr. Gerilyn Pilgrim. -pt to work on Photographer and working out with a Systems analyst, who can customize her workouts -reiterated importance of having inhaler with her while working out -check labs: TSH, CMET, lipids (pt to return for fasting lab appt) -f/u with me in 1 mo to track progress   FOLLOW UP: F/u in 1 month for weight loss Referral to nutrition  [place] J. Pollie Meyer, MD [facility]

## 2014-08-18 NOTE — Assessment & Plan Note (Signed)
Patient currently very motivated to lose weight, so that she'll be a candidate for breast reduction. We discussed weight loss tactics today. Plan: -schedule an appointment with our nutritionist Dr. Gerilyn Pilgrim. -pt to work on Photographer and working out with a Systems analyst, who can customize her workouts -reiterated importance of having inhaler with her while working out -check labs: TSH, CMET, lipids (pt to return for fasting lab appt) -f/u with me in 1 mo to track progress

## 2014-08-23 ENCOUNTER — Other Ambulatory Visit: Payer: Self-pay | Admitting: Family Medicine

## 2014-08-23 NOTE — Telephone Encounter (Signed)
Red team, this med is not on pt's current med list. Please call her and ask if she is actually taking it. If so, I can send in refill. Thanks, [name], MD

## 2014-08-24 NOTE — Telephone Encounter (Signed)
Spoke with patient, she states that she is taking this medication. Also scheduled patient for lab visit.

## 2014-08-25 ENCOUNTER — Other Ambulatory Visit: Payer: Medicare Other

## 2014-08-25 DIAGNOSIS — R635 Abnormal weight gain: Secondary | ICD-10-CM

## 2014-08-25 NOTE — Progress Notes (Signed)
CMP,TSH AND FLP DONE TODAY Parneet Glantz 

## 2014-08-26 LAB — COMPREHENSIVE METABOLIC PANEL
ALBUMIN: 4.3 g/dL (ref 3.6–5.1)
ALT: 11 U/L (ref 6–29)
AST: 13 U/L (ref 10–30)
Alkaline Phosphatase: 61 U/L (ref 33–115)
BUN: 11 mg/dL (ref 7–25)
CHLORIDE: 103 mmol/L (ref 98–110)
CO2: 24 mmol/L (ref 20–31)
Calcium: 9.2 mg/dL (ref 8.6–10.2)
Creat: 0.62 mg/dL (ref 0.50–1.10)
GLUCOSE: 83 mg/dL (ref 65–99)
Potassium: 4.3 mmol/L (ref 3.5–5.3)
Sodium: 140 mmol/L (ref 135–146)
TOTAL PROTEIN: 7.5 g/dL (ref 6.1–8.1)
Total Bilirubin: 0.3 mg/dL (ref 0.2–1.2)

## 2014-08-26 LAB — LIPID PANEL
Cholesterol: 158 mg/dL (ref 125–200)
HDL: 45 mg/dL — ABNORMAL LOW (ref >=46)
LDL Cholesterol: 98 mg/dL (ref <130)
Total CHOL/HDL Ratio: 3.5 Ratio (ref <=5.0)
Triglycerides: 74 mg/dL (ref <150)
VLDL: 15 mg/dL (ref <30)

## 2014-08-26 LAB — TSH: TSH: 1.429 u[IU]/mL (ref 0.350–4.500)

## 2014-08-27 ENCOUNTER — Encounter: Payer: Self-pay | Admitting: Family Medicine

## 2014-09-21 ENCOUNTER — Ambulatory Visit: Payer: Medicare Other | Admitting: Family Medicine

## 2014-09-23 ENCOUNTER — Ambulatory Visit (INDEPENDENT_AMBULATORY_CARE_PROVIDER_SITE_OTHER): Payer: Medicare Other | Admitting: Family Medicine

## 2014-09-23 ENCOUNTER — Encounter: Payer: Self-pay | Admitting: Family Medicine

## 2014-09-23 NOTE — Patient Instructions (Signed)
Keep up the awesome work!!! I'm very proud of you.  Call Dr. Gerilyn Pilgrim (our nutritionist) to set up an appointment. Her phone number is: [phone].   Come back and see me again in 6 weeks, sooner if needed.  Be well, Dr. Pollie Meyer

## 2014-09-23 NOTE — Assessment & Plan Note (Addendum)
Doing well with weight loss. Encouraged her to continue in these efforts. Gave phone number for nutritionist so she can set up an appointment. She will f/u with me in 6 weeks.

## 2014-09-23 NOTE — Progress Notes (Signed)
Patient ID: [name], female   DOB: 02-Aug-1980, 34 y.o.   MRN: [id number]  HPI:  Pt presents for scheduled follow up about her obesity and desire to lose weight. Her main motivator is wanting to have breast reduction surgery, but she is not able to get this done until she loses weight according to the surgeon.  Has been walking for exercise. Difficult to go to the gym due to having young children. Has watched what she is eating, drinking only water.  ROS: See HPI.  PMFSH: hx asthma, obesity, GERD  PHYSICAL EXAM: BP 138/87 mmHg  Pulse 80  Temp(Src) 98.3 F (36.8 C) (Oral)  Wt 253 lb 12.8 oz (115.123 kg)  Gen: NAD, pleasant, cooperative HEENT: NCAT Heart: RRR no murmur Lungs: CTAB NWOB Neuro: grossly nonfocal speech normal Ext: atraumatic  ASSESSMENT/PLAN:  Morbid obesity Doing well with weight loss. Encouraged her to continue in these efforts. Gave phone number for nutritionist so she can set up an appointment. She will f/u with me in 6 weeks.   FOLLOW UP: F/u in 6 weeks for weight loss Schedule nutrition appt  [place] J. Pollie Meyer, MD [facility]

## 2014-10-18 ENCOUNTER — Other Ambulatory Visit: Payer: Self-pay | Admitting: Family Medicine

## 2014-10-20 DIAGNOSIS — M542 Cervicalgia: Secondary | ICD-10-CM | POA: Diagnosis not present

## 2014-10-20 DIAGNOSIS — M546 Pain in thoracic spine: Secondary | ICD-10-CM | POA: Diagnosis not present

## 2014-10-20 DIAGNOSIS — N262 Page kidney: Secondary | ICD-10-CM | POA: Diagnosis not present

## 2014-10-28 ENCOUNTER — Encounter: Payer: Self-pay | Admitting: Family Medicine

## 2014-10-28 ENCOUNTER — Ambulatory Visit (INDEPENDENT_AMBULATORY_CARE_PROVIDER_SITE_OTHER): Payer: Medicare Other | Admitting: Family Medicine

## 2014-10-28 NOTE — Patient Instructions (Addendum)
Goal:  1.  Incorporate at least 1-2 vegetables at lunch and dinner 2.  Take time to sit and focus on your meals 3.  Plan your meals the day before 4.  Prep meals/ snacks for the next day when your husband gets home   Get a food tracker app like Calorie tracker or LoseIt app to help you maintain a food diary  Snack: 1. Greek yogurt 2. Grapefruit 3. Apples 4. Banana 5. Nuts (maybe mix in dried fruit or whole grain, un-sugared cereal) 6. Hummus 7. Vegetables like carrots, bell pepper

## 2014-10-28 NOTE — Assessment & Plan Note (Signed)
Patient with good results with lifestyle changes.  Has decreased portion sizes and is eating healthier foods.  Discussed incorporation of more fruits and vegetables.  Patient also to focus on mindful eating habits. - Goals:  1.  Incorporate at least 1-2 vegetables at lunch and dinner 2.  Take time to sit and focus on your meals 3.  Plan your meals the day before 4.  Prep meals/ snacks for the next day when your husband gets home  - Get a food tracker app like Calorie tracker or LoseIt app to help you maintain a food diary - Follow up with Dr Gerilyn PilgrimSykes in 1 month for nutrition follow up

## 2014-10-28 NOTE — Progress Notes (Signed)
Learning Readiness:   Change in progress  Desires a breast reduction.  Goal is 200 lb.  This goal is a comfortable weight for patient.  She notes that this was her high school weight, before children.  Reports eating smaller portions.  Has cut fried foods and eating out.  Does not drink anything but water and an occ orange juice.  Exercise videos during children's nap.  Max weight 264 lb about 4 months ago.    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  Height 5\' 5"  (1.651 m), weight 244 lb 6.4 oz (110.859 kg), unknown if currently breastfeeding.  Usual eating pattern includes 2 meals and 3 snacks per day.  Frequent foods and beverages include protein shake, water.  Usual physical activity includes stairs 4-5x daily, 3x week exercise videos 3-7 minutes (Zumba online).  24-hr recall: (Up at 8:30 AM)  B ( 9-10 AM)-     Premixed protein shake    Snk (11-12 AM)-  Green tea, sweetened with 2 tsp sugar    L ( 2 PM)-      2 TBS tuna w/mayo, eggs, onions and 5 saltine crackers, water   Snk (all day PM)-Sunflower seeds/ mixed nuts (1 C)  D ( 6 PM)-      Chicken wrap (1/2 grilled chicken, lettuce, shredded cheese), water Snk ( 8 PM)-      1 c yogurt (Greek w/ almonds)  Typical day? Yes.    Handouts given during visit include:  AVS  Demonstrated degree of understanding via:  Teach Back   Monitoring/Evaluation:  Dietary intake, exercise, and body weight in 1 month(s).  Morbid obesity Patient with good results with lifestyle changes.  Has decreased portion sizes and is eating healthier foods.  Discussed incorporation of more fruits and vegetables.  Patient also to focus on mindful eating habits. - Goals:  1.  Incorporate at least 1-2 vegetables at lunch and dinner 2.  Take time to sit and focus on your meals 3.  Plan your meals the day  before 4.  Prep meals/ snacks for the next day when your husband gets home  - Get a food tracker app like Calorie tracker or LoseIt app to help you maintain a food diary - Follow up with Dr Gerilyn PilgrimSykes in 1 month for nutrition follow up   Total time spent with patient 60 minutes.  Greater than 50% of this face to face encounter spent in coordination of care/counseling.  Ashly M. Nadine CountsGottschalk, DO PGY-2, [facility]

## 2014-11-08 ENCOUNTER — Encounter (HOSPITAL_COMMUNITY): Payer: Self-pay | Admitting: Emergency Medicine

## 2014-11-08 ENCOUNTER — Emergency Department (HOSPITAL_COMMUNITY)
Admission: EM | Admit: 2014-11-08 | Discharge: 2014-11-08 | Disposition: A | Discharge status: Home / Self Care | Payer: Medicare Other | Attending: Emergency Medicine | Admitting: Emergency Medicine

## 2014-11-08 DIAGNOSIS — M545 Low back pain, unspecified: Secondary | ICD-10-CM

## 2014-11-08 DIAGNOSIS — Z79899 Other long term (current) drug therapy: Secondary | ICD-10-CM | POA: Insufficient documentation

## 2014-11-08 DIAGNOSIS — K219 Gastro-esophageal reflux disease without esophagitis: Secondary | ICD-10-CM | POA: Diagnosis not present

## 2014-11-08 DIAGNOSIS — Z87891 Personal history of nicotine dependence: Secondary | ICD-10-CM | POA: Diagnosis not present

## 2014-11-08 DIAGNOSIS — J45909 Unspecified asthma, uncomplicated: Secondary | ICD-10-CM | POA: Diagnosis not present

## 2014-11-08 MED ORDER — KETOROLAC TROMETHAMINE 60 MG/2ML IM SOLN
60.0000 mg | Freq: Once | INTRAMUSCULAR | Status: AC
  Administered 2014-11-08: 60 mg via INTRAMUSCULAR
  Filled 2014-11-08: qty 2

## 2014-11-08 MED ORDER — TIZANIDINE HCL 4 MG PO TABS: 4.0000 mg | ORAL_TABLET | Freq: Four times a day (QID) | ORAL | Status: DC | PRN

## 2014-11-08 NOTE — Discharge Instructions (Signed)
Read the information below.  Use the prescribed medication as directed.  Please discuss all new medications with your pharmacist.  You may return to the Emergency Department at any time for worsening condition or any new symptoms that concern you.   If you develop fevers, loss of control of bowel or bladder, weakness or numbness in your legs, or are unable to walk, return to the ER for a recheck.  ° ° °Back Pain, Adult °Back pain is very common in adults. The cause of back pain is rarely dangerous and the pain often gets better over time. The cause of your back pain may not be known. Some common causes of back pain include: °· Strain of the muscles or ligaments supporting the spine. °· Wear and tear (degeneration) of the spinal disks. °· Arthritis. °· Direct injury to the back. °For many people, back pain may return. Since back pain is rarely dangerous, most people can learn to manage this condition on their own. °HOME CARE INSTRUCTIONS °Watch your back pain for any changes. The following actions may help to lessen any discomfort you are feeling: °· Remain active. It is stressful on your back to sit or stand in one place for long periods of time. Do not sit, drive, or stand in one place for more than 30 minutes at a time. Take short walks on even surfaces as soon as you are able. Try to increase the length of time you walk each day. °· Exercise regularly as directed by your health care provider. Exercise helps your back heal faster. It also helps avoid future injury by keeping your muscles strong and flexible. °· Do not stay in bed. Resting more than 1-2 days can delay your recovery. °· Pay attention to your body when you bend and lift. The most comfortable positions are those that put less stress on your recovering back. Always use proper lifting techniques, including: °¨ Bending your knees. °¨ Keeping the load close to your body. °¨ Avoiding twisting. °· Find a comfortable position to sleep. Use a firm mattress  and lie on your side with your knees slightly bent. If you lie on your back, put a pillow under your knees. °· Avoid feeling anxious or stressed. Stress increases muscle tension and can worsen back pain. It is important to recognize when you are anxious or stressed and learn ways to manage it, such as with exercise. °· Take medicines only as directed by your health care provider. Over-the-counter medicines to reduce pain and inflammation are often the most helpful. Your health care provider may prescribe muscle relaxant drugs. These medicines help dull your pain so you can more quickly return to your normal activities and healthy exercise. °· Apply ice to the injured area: °¨ Put ice in a plastic bag. °¨ Place a towel between your skin and the bag. °¨ Leave the ice on for 20 minutes, 2-3 times a day for the first 2-3 days. After that, ice and heat may be alternated to reduce pain and spasms. °· Maintain a healthy weight. Excess weight puts extra stress on your back and makes it difficult to maintain good posture. °SEEK MEDICAL CARE IF: °· You have pain that is not relieved with rest or medicine. °· You have increasing pain going down into the legs or buttocks. °· You have pain that does not improve in one week. °· You have night pain. °· You lose weight. °· You have a fever or chills. °SEEK IMMEDIATE MEDICAL CARE IF:  °· You develop new bowel or bladder control problems. °·   You have unusual weakness or numbness in your arms or legs. °· You develop nausea or vomiting. °· You develop abdominal pain. °· You feel faint. °  °This information is not intended to replace advice given to you by your health care provider. Make sure you discuss any questions you have with your health care provider. °  °Document Released: 01/01/2005 Document Revised: 01/22/2014 Document Reviewed: 05/05/2013 °Elsevier Interactive Patient Education ©2016 [employer] ° °

## 2014-11-08 NOTE — ED Provider Notes (Signed)
CSN: [id number]     Arrival date & time 11/08/14  0944 History   None    Chief Complaint  Patient presents with  . Back Pain     (Consider location/radiation/quality/duration/timing/severity/associated sxs/prior Treatment) The history is provided by the patient.     Pt with hx morbid obesity presents with low back pain that is located across her entire back with occasional radiation up her back and down both legs.  Pain is described as sore, worse with changing positions, standing for a long time, movement.  Has taken 600mg  ibuprofen and taken hot baths without improvement.  Started her period the same day the pain began and thought it might be related but the period has continued as usual and the back pain has progressively worsened.   Denies fevers, chills, abdominal pain, loss of control of bowel or bladder, weakness of numbness of the extremities, saddle anesthesia, bowel, urinary, or vaginal complaints.  Menstrual period began 5 days ago, on time and normal.  States she is no longer breastfeeding.  Does have an infant and a toddler as well as an 33 year old at home- does have help with them.     Past Medical History  Diagnosis Date  . Asthma      reservations to 2-3 timmes  a year that required ED visits/hospitalizations. Never intubated  . GERD (gastroesophageal reflux disease)    Past Surgical History  Procedure Laterality Date  . Cesarean section    . Cesarean section N/A 10/25/2012    Procedure: CESAREAN SECTION;  Surgeon: Dorien Chihuahuaara J. Richardson Doppole, MD;  Location: [facility];  Service: Obstetrics;  Laterality: N/A;  . Cesarean section N/A 03/05/2014    Procedure: CESAREAN SECTION;  Surgeon: Purcell NailsAngela Y Roberts, MD;  Location: [facility];  Service: Obstetrics;  Laterality: N/A;   Family History  Problem Relation Age of Onset  . Hypertension Mother   . Asthma Father   . Emphysema Maternal Grandfather   . Allergies Maternal Grandmother   . Asthma Maternal Grandmother    Social History  Substance  Use Topics  . Smoking status: Former Smoker -- 0.30 packs/day for .5 years    Types: Cigarettes    Quit date: 01/15/2001  . Smokeless tobacco: Never Used  . Alcohol Use: No     Comment: occasionaly   OB History    Gravida Para Term Preterm AB TAB SAB Ectopic Multiple Living   7 3 3  0 4 2 2  0 0 3     Review of Systems  All other systems reviewed and are negative.     Allergies  Iohexol and Moxifloxacin  Home Medications   Prior to Admission medications   Medication Sig Start Date End Date Taking? Authorizing Provider  budesonide-formoterol (SYMBICORT) 160-4.5 MCG/ACT inhaler Inhale 2 puffs into the lungs 2 (two) times daily. Overdue for follow up with PCP 06/28/14   Leona SingletonMaria T Thekkekandam, MD  docusate sodium (COLACE) 50 MG capsule Take 2 capsules (100 mg total) by mouth 2 (two) times daily as needed for mild constipation or moderate constipation. Patient not taking: Reported on 02/17/2014 01/03/14   Sherre ScarletKimberly Williams, CNM  ferrous sulfate 325 (65 FE) MG tablet Take 1 tablet (325 mg total) by mouth daily with breakfast. 03/08/14   Gerrit HeckJessica Emly, CNM  ibuprofen (ADVIL,MOTRIN) 600 MG tablet Take 1 tablet (600 mg total) by mouth every 6 (six) hours as needed. 03/08/14   Gerrit HeckJessica Emly, CNM  montelukast (SINGULAIR) 10 MG tablet TAKE ONE TABLET BY MOUTH AT  BEDTIME 08/24/14   [name], MD  norethindrone (ORTHO MICRONOR) 0.35 MG tablet Take 1 tablet (0.35 mg total) by mouth daily. 04/04/14   Gerrit Heck, CNM  oxyCODONE-acetaminophen (PERCOCET/ROXICET) 5-325 MG per tablet Take 1-2 tablets by mouth every 6 (six) hours as needed (for pain scale equal to or greater than 7). 03/08/14   Gerrit Heck, CNM  predniSONE (DELTASONE) 50 MG tablet Take 1 tablet (50 mg total) by mouth daily with breakfast. Take immediately after breastfeeding and wait 4 hours before breastfeeding again 07/13/14   [name], MD  Prenatal Vit-Fe Fumarate-FA (MULTIVITAMIN-PRENATAL) 27-0.8 MG TABS Take 1 tablet by  mouth daily at 12 noon. 03/14/12   Everrett Coombe, DO  PROAIR HFA 108 (90 BASE) MCG/ACT inhaler INHALE TWO PUFFS INTO LUNGS EVERY 4 HOURS AS NEEDED FOR  WHEEZING  OR  SHORTNESS  OF  BREATH 10/18/14   [name], MD  tiZANidine (ZANAFLEX) 4 MG tablet Take 1-2 tablets (4-8 mg total) by mouth every 6 (six) hours as needed for muscle spasms (and pain). 11/08/14   Trixie Dredge, PA-C   BP 123/76 mmHg  Pulse 60  Temp(Src) 98.4 F (36.9 C) (Oral)  Resp 16  SpO2 98%  LMP 11/08/2014 Physical Exam  Constitutional: She appears well-developed and well-nourished. No distress.  HENT:  Head: Normocephalic and atraumatic.  Neck: Neck supple.  Pulmonary/Chest: Effort normal.  Abdominal: Soft. She exhibits no distension and no mass. There is no tenderness. There is no rebound and no guarding.  Musculoskeletal:       Back:  Spine nontender, no crepitus, or stepoffs. Lower extremities:  Strength 5/5, sensation intact, distal pulses intact.    Gait is stiff but normal.    Neurological: She is alert.  Skin: She is not diaphoretic.  Nursing note and vitals reviewed.   ED Course  Procedures (including critical care time) Labs Review Labs Reviewed - No data to display  Imaging Review No results found. I have personally reviewed and evaluated these images and lab results as part of my medical decision-making.   EKG Interpretation None      MDM   Final diagnoses:  Bilateral low back pain without sciatica    Afebrile, nontoxic patient with mechanical low back pain. No red flags.  Given toradol in ED (pt's last home dose of ibuprofen was last night), d/c with zanaflex, PCP follow up.  Discussed result, findings, treatment, and follow up  with patient.  Pt given return precautions.  Pt verbalizes understanding and agrees with plan.       Trixie Dredge, PA-C 11/08/14 1321  Mancel Bale, MD 11/08/14 [id number]

## 2014-11-08 NOTE — ED Notes (Signed)
Per pt, states lower back pain since last week

## 2014-11-11 ENCOUNTER — Ambulatory Visit: Payer: Medicare Other | Admitting: Family Medicine

## 2014-12-02 ENCOUNTER — Ambulatory Visit: Payer: Medicare Other | Admitting: Family Medicine

## 2015-01-26 DIAGNOSIS — N62 Hypertrophy of breast: Secondary | ICD-10-CM | POA: Diagnosis not present

## 2015-01-26 DIAGNOSIS — M546 Pain in thoracic spine: Secondary | ICD-10-CM | POA: Diagnosis not present

## 2015-01-26 DIAGNOSIS — M542 Cervicalgia: Secondary | ICD-10-CM | POA: Diagnosis not present

## 2015-03-01 ENCOUNTER — Other Ambulatory Visit: Payer: Self-pay | Admitting: Family Medicine

## 2015-03-01 NOTE — Telephone Encounter (Signed)
Pt needs a refill on her singular called in. She has an appointment on 03/14/15, jw

## 2015-03-03 MED ORDER — MONTELUKAST SODIUM 10 MG PO TABS: 10.0000 mg | ORAL_TABLET | Freq: Every day | ORAL | Status: DC

## 2015-03-03 NOTE — Telephone Encounter (Signed)
Called again about singular refill

## 2015-03-06 ENCOUNTER — Other Ambulatory Visit: Payer: Self-pay | Admitting: Family Medicine

## 2015-03-14 ENCOUNTER — Ambulatory Visit: Payer: Medicare Other | Admitting: Family Medicine

## 2015-03-14 ENCOUNTER — Encounter: Payer: Self-pay | Admitting: Family Medicine

## 2015-03-14 ENCOUNTER — Ambulatory Visit (INDEPENDENT_AMBULATORY_CARE_PROVIDER_SITE_OTHER): Payer: PPO | Admitting: Family Medicine

## 2015-03-14 VITALS — BP 122/78 | HR 71 | Temp 98.2°F | Ht 65.0 in | Wt 242.1 lb

## 2015-03-14 DIAGNOSIS — Z3041 Encounter for surveillance of contraceptive pills: Secondary | ICD-10-CM | POA: Diagnosis not present

## 2015-03-14 DIAGNOSIS — J4550 Severe persistent asthma, uncomplicated: Secondary | ICD-10-CM

## 2015-03-14 MED ORDER — ALBUTEROL SULFATE HFA 108 (90 BASE) MCG/ACT IN AERS: 2.0000 | INHALATION_SPRAY | Freq: Four times a day (QID) | RESPIRATORY_TRACT | Status: DC | PRN

## 2015-03-14 MED ORDER — BUDESONIDE-FORMOTEROL FUMARATE 160-4.5 MCG/ACT IN AERO: 2.0000 | INHALATION_SPRAY | Freq: Two times a day (BID) | RESPIRATORY_TRACT | Status: DC

## 2015-03-14 MED ORDER — MONTELUKAST SODIUM 10 MG PO TABS: 10.0000 mg | ORAL_TABLET | Freq: Every day | ORAL | Status: DC

## 2015-03-14 MED ORDER — NORETHINDRONE 0.35 MG PO TABS: 1.0000 | ORAL_TABLET | Freq: Every day | ORAL | Status: DC

## 2015-03-14 NOTE — Patient Instructions (Signed)
Schedule a visit with Dr. Sherene Sires to get his clearance on the surgery Refilled medicines Follow up with me in 6 months, sooner if needed  Be well, Dr. Pollie Meyer

## 2015-03-17 DIAGNOSIS — Z309 Encounter for contraceptive management, unspecified: Secondary | ICD-10-CM | POA: Insufficient documentation

## 2015-03-17 NOTE — Progress Notes (Signed)
Date of Visit: 03/14/2015   HPI:  Patient presents for medication refill.  Operative clearance - planning to have breast reduction done. Was told she needs operative clearance. Has not recently seen pulmonologist Dr. Sherene Sires. Using albuterol only 1-2 times per month. Is on singulair, symbicort. Is on disability for severe persistent asthma.  Birth control refill - no longer breastfeeding. Has been using norethindrone. Able to take at same time every day. Needs refill. Denies any history of prior venous thromboembolism, migraine with aura, or current smoking. Ok with staying on norethrindrone.   ROS: See HPI.  PMFSH: history obesity, large breasts, gerd, chronic constipation  PHYSICAL EXAM: BP 122/78 mmHg  Pulse 71  Temp(Src) 98.2 F (36.8 C) (Oral)  Ht  (1.651 m)  Wt 242 lb 1.6 oz (109.816 kg)  BMI 40.29 kg/m2  LMP 03/08/2015 Gen: NAD, pleasant, cooperative HEENT: normocephalic, atraumatic, moist mucous membranes  Heart: regular rate and rhythm, no murmur Lungs: clear to auscultation bilaterally, normal work of breathing  Neuro: alert, grossly nonfocal, speech normal Ext: No appreciable lower extremity edema bilaterally   ASSESSMENT/PLAN:  Severe persistent asthma Given that she has severe persistent asthma causing her to be on disability, I would like her to see her pulmonologist for surgical clearance. Her symptoms seem well controlled at present.   Contraception management Discussed options of transition to combined OCP. After discussion of risks and benefits, patient elected to stay on norethindrone. New rx sent in.    FOLLOW UP: Follow up in 6 months with me for routine medical problems Schedule with pulmonologist for surgical clearance  [place] J. Pollie Meyer, MD [facility]

## 2015-03-17 NOTE — Assessment & Plan Note (Signed)
Discussed options of transition to combined OCP. After discussion of risks and benefits, patient elected to stay on norethindrone. New rx sent in.

## 2015-03-17 NOTE — Assessment & Plan Note (Signed)
Given that she has severe persistent asthma causing her to be on disability, I would like her to see her pulmonologist for surgical clearance. Her symptoms seem well controlled at present.

## 2015-03-23 DIAGNOSIS — N62 Hypertrophy of breast: Secondary | ICD-10-CM | POA: Diagnosis not present

## 2015-03-23 DIAGNOSIS — M542 Cervicalgia: Secondary | ICD-10-CM | POA: Diagnosis not present

## 2015-03-23 DIAGNOSIS — M546 Pain in thoracic spine: Secondary | ICD-10-CM | POA: Diagnosis not present

## 2015-04-03 ENCOUNTER — Encounter (HOSPITAL_BASED_OUTPATIENT_CLINIC_OR_DEPARTMENT_OTHER): Payer: Self-pay | Admitting: *Deleted

## 2015-04-03 ENCOUNTER — Emergency Department (HOSPITAL_BASED_OUTPATIENT_CLINIC_OR_DEPARTMENT_OTHER)
Admission: EM | Admit: 2015-04-03 | Discharge: 2015-04-03 | Disposition: A | Discharge status: Home / Self Care | Payer: PPO | Attending: Emergency Medicine | Admitting: Emergency Medicine

## 2015-04-03 DIAGNOSIS — Z8719 Personal history of other diseases of the digestive system: Secondary | ICD-10-CM | POA: Diagnosis not present

## 2015-04-03 DIAGNOSIS — Z7951 Long term (current) use of inhaled steroids: Secondary | ICD-10-CM | POA: Insufficient documentation

## 2015-04-03 DIAGNOSIS — Z79899 Other long term (current) drug therapy: Secondary | ICD-10-CM | POA: Diagnosis not present

## 2015-04-03 DIAGNOSIS — J45901 Unspecified asthma with (acute) exacerbation: Secondary | ICD-10-CM | POA: Diagnosis not present

## 2015-04-03 DIAGNOSIS — Z793 Long term (current) use of hormonal contraceptives: Secondary | ICD-10-CM | POA: Insufficient documentation

## 2015-04-03 DIAGNOSIS — Z87891 Personal history of nicotine dependence: Secondary | ICD-10-CM | POA: Insufficient documentation

## 2015-04-03 DIAGNOSIS — R0602 Shortness of breath: Secondary | ICD-10-CM | POA: Diagnosis not present

## 2015-04-03 MED ORDER — ALBUTEROL SULFATE (2.5 MG/3ML) 0.083% IN NEBU
5.0000 mg | INHALATION_SOLUTION | Freq: Once | RESPIRATORY_TRACT | Status: AC
  Administered 2015-04-03: 5 mg via RESPIRATORY_TRACT
  Filled 2015-04-03: qty 6

## 2015-04-03 MED ORDER — ALBUTEROL SULFATE (2.5 MG/3ML) 0.083% IN NEBU
2.5000 mg | INHALATION_SOLUTION | Freq: Once | RESPIRATORY_TRACT | Status: AC
  Administered 2015-04-03: 2.5 mg via RESPIRATORY_TRACT

## 2015-04-03 MED ORDER — ALBUTEROL (5 MG/ML) CONTINUOUS INHALATION SOLN
10.0000 mg/h | INHALATION_SOLUTION | RESPIRATORY_TRACT | Status: DC
  Administered 2015-04-03: 10 mg/h via RESPIRATORY_TRACT
  Filled 2015-04-03: qty 20

## 2015-04-03 MED ORDER — PREDNISONE 20 MG PO TABS: 40.0000 mg | ORAL_TABLET | Freq: Every day | ORAL | Status: DC

## 2015-04-03 MED ORDER — PREDNISONE 20 MG PO TABS
40.0000 mg | ORAL_TABLET | Freq: Once | ORAL | Status: AC
  Administered 2015-04-03: 40 mg via ORAL
  Filled 2015-04-03: qty 2

## 2015-04-03 MED ORDER — IPRATROPIUM-ALBUTEROL 0.5-2.5 (3) MG/3ML IN SOLN
3.0000 mL | Freq: Once | RESPIRATORY_TRACT | Status: AC
  Administered 2015-04-03: 3 mL via RESPIRATORY_TRACT

## 2015-04-03 MED ORDER — IPRATROPIUM-ALBUTEROL 0.5-2.5 (3) MG/3ML IN SOLN
RESPIRATORY_TRACT | Status: AC
  Administered 2015-04-03: 3 mL via RESPIRATORY_TRACT
  Filled 2015-04-03: qty 3

## 2015-04-03 MED ORDER — ALBUTEROL SULFATE (2.5 MG/3ML) 0.083% IN NEBU
INHALATION_SOLUTION | RESPIRATORY_TRACT | Status: AC
  Administered 2015-04-03: 2.5 mg via RESPIRATORY_TRACT
  Filled 2015-04-03: qty 3

## 2015-04-03 NOTE — ED Notes (Signed)
Pt ambulated with steady gait in NAD, O2 sat 98%, pulse 112-115, denied SOB at any time.

## 2015-04-03 NOTE — ED Notes (Signed)
Hx of asthma. SOB since yesterday. Assessed by RT in triage and received breathing tx. Audibly wheezing after treatment but speaking full sentences

## 2015-04-03 NOTE — ED Provider Notes (Signed)
CSN: [id number]     Arrival date & time 04/03/15  1810 History  By signing my name below, I, [name], attest that this documentation has been prepared under the direction and in the presence of Cynthia Memos, MD. Electronically Signed: Budd [name], ED Scribe. 04/03/2015. 7:04 PM.    Chief Complaint  Patient presents with  . Asthma   The history is provided by the patient. No language interpreter was used.   HPI Comments: [name] is a 35 y.o. female former smoker with a PMHx of asthma and GERD who presents to the Emergency Department complaining of asthma exacerbation onset 1 day ago. She reports associated SOB as well as painful chest pressure. She states the SOB has improved somewhat after receiving a breathing treatment in the ED, though she is still feeling the chest pressure and pain. She notes this feels similar to previous severe asthma episodes. She reports taking Symbicort BID and Singulair at night. She states that she only uses her inhaler PRN, and has used it today without relief. She notes her sister is currently visiting and brought her dogs, to whom pt is allergic. She states she has not been seen at a hospital for asthma for quite some time. She notes she is also on BCP and Pepcid for GERD. She denies a PMHx of DVT, PE, and heart disease. Pt denies fever and leg swelling.   Past Medical History  Diagnosis Date  . Asthma      reservations to 2-3 timmes  a year that required ED visits/hospitalizations. Never intubated  . GERD (gastroesophageal reflux disease)    Past Surgical History  Procedure Laterality Date  . Cesarean section    . Cesarean section N/A 10/25/2012    Procedure: CESAREAN SECTION;  Surgeon: Dorien Chihuahua. Richardson Dopp, MD;  Location: [facility];  Service: Obstetrics;  Laterality: N/A;  . Cesarean section N/A 03/05/2014    Procedure: CESAREAN SECTION;  Surgeon: Purcell Nails, MD;  Location: [facility];  Service: Obstetrics;  Laterality: N/A;   Family History   Problem Relation Age of Onset  . Hypertension Mother   . Asthma Father   . Emphysema Maternal Grandfather   . Allergies Maternal Grandmother   . Asthma Maternal Grandmother    Social History  Substance Use Topics  . Smoking status: Former Smoker -- 0.30 packs/day for .5 years    Types: Cigarettes    Quit date: 01/15/2001  . Smokeless tobacco: Never Used  . Alcohol Use: No     Comment: occasionaly   OB History    Gravida Para Term Preterm AB TAB SAB Ectopic Multiple Living   0 0 0 3     Review of Systems  Constitutional: Negative for fever.  Respiratory: Positive for shortness of breath and wheezing.   Cardiovascular: Positive for chest pain. Negative for leg swelling.  All other systems reviewed and are negative.   Allergies  Iohexol and Moxifloxacin  Home Medications   Prior to Admission medications   Medication Sig Start Date End Date Taking? Authorizing Provider  albuterol (PROVENTIL HFA;VENTOLIN HFA) 108 (90 Base) MCG/ACT inhaler Inhale 2 puffs into the lungs every 6 (six) hours as needed for wheezing or shortness of breath. 03/14/15  Yes [name], MD  budesonide-formoterol [facility]) 160-4.5 MCG/ACT inhaler Inhale 2 puffs into the lungs 2 (two) times daily. 03/14/15  Yes [name], MD  montelukast (SINGULAIR) 10 MG tablet Take 1 tablet (10 mg total) by  mouth at bedtime. 03/14/15  Yes Latrelle DodrillBrittany J McIntyre, MD  norethindrone (ORTHO MICRONOR) 0.35 MG tablet Take 1 tablet (0.35 mg total) by mouth daily. 03/14/15  Yes Latrelle DodrillBrittany J McIntyre, MD  Prenatal Vit-Fe Fumarate-FA (MULTIVITAMIN-PRENATAL) 27-0.8 MG TABS Take 1 tablet by mouth daily at 12 noon. 03/14/12  Yes [name], DO  predniSONE (DELTASONE) 20 MG tablet Take 2 tablets (40 mg total) by mouth daily with breakfast. 04/04/15   Cynthia MemosJason Bennetta Rudden, MD   BP 137/72 mmHg  Pulse 95  Temp(Src) 98.8 F (37.1 C) (Oral)  Resp 18  Ht 5\' 6"  (1.676 m)  Wt 239 lb (108.41 kg)  BMI 38.59 kg/m2  SpO2  98%  LMP 03/08/2015  Breastfeeding? No Physical Exam  Constitutional: She is oriented to person, place, and time. She appears well-developed and well-nourished.  HENT:  Head: Normocephalic and atraumatic.  Eyes: Conjunctivae are normal. Right eye exhibits no discharge. Left eye exhibits no discharge.  Cardiovascular: Normal rate, regular rhythm and normal heart sounds.   Pulmonary/Chest: Effort normal. No respiratory distress. She has wheezes. She has no rales.  Diffuse wheezing in all lung fields, no crackles  Musculoskeletal: Normal range of motion. She exhibits no edema or tenderness.  No calf TTP, no BLE edema  Neurological: She is alert and oriented to person, place, and time. Coordination normal.  Skin: Skin is warm and dry. No rash noted. She is not diaphoretic. No erythema.  Psychiatric: She has a normal mood and affect.  Nursing note and vitals reviewed.   ED Course  Procedures   CRITICAL CARE Performed by: Cynthia MemosMesner, Efraim Vanallen  Total critical care time: 35 minutes Critical care time was exclusive of separately billable procedures and treating other patients. Critical care was necessary to treat or prevent imminent or life-threatening deterioration. Critical care was time spent personally by me on the following activities: development of treatment plan with patient and/or surrogate as well as nursing, discussions with consultants, evaluation of patient's response to treatment, examination of patient, obtaining history from patient or surrogate, ordering and performing treatments and interventions, ordering and review of laboratory studies, ordering and review of radiographic studies, pulse oximetry and re-evaluation of patient's condition.   DIAGNOSTIC STUDIES: Oxygen Saturation is 100% on RA, normal by my interpretation.    COORDINATION OF CARE: 6:59 PM - Discussed plans to order another breathing treatment. Pt advised of plan for treatment and pt agrees.  Labs Review Labs  Reviewed - No data to display  Imaging Review No results found. I have personally reviewed and evaluated these images and lab results as part of my medical decision-making.   EKG Interpretation None      MDM   Final diagnoses:  Asthma exacerbation    35 -year-old female with a history of asthma presents to the emergency department with asthma exacerbation. Given back-to-back continuous albuterol treatments with subjective improvement symptoms. Patient able to ambulate without dyspnea or hypoxia. She will was slightly tachycardic with likely secondary to the albuterol. Started on steroids as well. She still has some slight wheezing in all lung fields however was much improved based on her initial examination. Discussed with her staying in the hospital versus being discharged and doing albuterol treatments. She decided to trial outpatient therapy and I felt this was appropriate based on her stability over the time in the emergency department and her normal vital signs. Patient can will return here for any new or worsening symptoms. Otherwise will follow-up with her primary doctor in 2-3 days to ensure  she is still doing well.  New Prescriptions: Discharge Medication List as of 04/03/2015 11:41 PM    START taking these medications   Details  predniSONE (DELTASONE) 20 MG tablet Take 2 tablets (40 mg total) by mouth daily with breakfast., Starting 04/04/2015, Until Discontinued, Print         I have personally and contemperaneously reviewed labs and imaging and used in my decision making as above.   A medical screening exam was performed and I feel the patient has had an appropriate workup for their chief complaint at this time and likelihood of emergent condition existing is low. Their vital signs are stable. They have been counseled on decision, discharge, follow up and which symptoms necessitate immediate return to the emergency department.  They verbally stated understanding and  agreement with plan and discharged in stable condition.   I personally performed the services described in this documentation, which was scribed in my presence. The recorded information has been reviewed and is accurate.    Cynthia Memos, MD 04/04/15 [phone]

## 2015-04-13 ENCOUNTER — Ambulatory Visit (INDEPENDENT_AMBULATORY_CARE_PROVIDER_SITE_OTHER): Payer: PPO | Admitting: Internal Medicine

## 2015-04-13 ENCOUNTER — Encounter: Payer: Self-pay | Admitting: Internal Medicine

## 2015-04-13 VITALS — BP 128/82 | HR 68 | Ht 66.0 in | Wt 243.4 lb

## 2015-04-13 DIAGNOSIS — J4550 Severe persistent asthma, uncomplicated: Secondary | ICD-10-CM | POA: Diagnosis not present

## 2015-04-13 DIAGNOSIS — J4540 Moderate persistent asthma, uncomplicated: Secondary | ICD-10-CM | POA: Diagnosis not present

## 2015-04-13 LAB — NITRIC OXIDE: NITRIC OXIDE: 119

## 2015-04-13 NOTE — Progress Notes (Signed)
Subjective:    Patient ID: [name], female    DOB: [birth year],   MRN: [id number]  HPI  62 yobf with asthma since age 35 on maint rx ever since then quit smoking 2004 and no better intially seen in pulmonary clinic by Dr Sung Amabile early 2000s followed at [facility] fm practice/ ER with dtca asthma she attributes to allergies self referred to Pulmonary clinic 04/13/2015 p last ov:   02/22/2011 recs Pepcid 20 mg one at bedtime GERD diet / lifestyle    Plan A is to use the symbicort 160 Take 2 puffs first thing in am and then another 2 puffs about 12 hours later and the singulair at night If you can't catch your breath, go to Plan B= ok to use ventolin hfa if needed up to every 4 hours with the goal of less than twice weekly If you still catch your breath Plan C = use the nebulizer up to 4 hours and call us right away for appt to see me or Tammy Nurse Practioner. F/u 4 weeks > no show     04/13/2015 1st [facility] office visit/ Wert  maint rx symbicort 160 2 bid Chief Complaint  Patient presents with  . Pulmonary Consult    Self referral Asthma- former pt, last seen 2013  while on maint on symbicort 160 2bid but two er trips both p exp to dogs while living at [employer] house but getting ready to move.  symbicort is 90 dollars/ proair is 45 and uses maybe one every 6 m and used 3 x then to ER  Off symb x 3 days  Overt HB on bcps  No obvious other patterns in day to day or daytime variabilty or assoc excess/ purulent sputum or mucus plugs   or cp or chest tightness, subjective wheeze overt sinus or hb symptoms. No unusual exp hx or h/o childhood pna/ asthma or knowledge of premature birth.  Sleeping ok without nocturnal  or early am exacerbation  of respiratory  c/o's or need for noct saba. Also denies any obvious fluctuation of symptoms with weather or environmental changes or other aggravating or alleviating factors except as outlined above   Current Medications, Allergies, Complete  Past Medical History, Past Surgical History, Family History, and Social History were reviewed in [employer] record.           Review of Systems  Constitutional: Negative for fever and unexpected weight change.  HENT: Negative for congestion, dental problem, ear pain, nosebleeds, postnasal drip, rhinorrhea, sinus pressure, sneezing, sore throat and trouble swallowing.   Eyes: Negative for redness and itching.  Respiratory: Positive for shortness of breath. Negative for cough, chest tightness and wheezing.   Cardiovascular: Positive for chest pain. Negative for palpitations and leg swelling.  Gastrointestinal: Negative for nausea and vomiting.       Acid heartburn  Genitourinary: Negative for dysuria.  Musculoskeletal: Negative for joint swelling.  Skin: Negative for rash.  Neurological: Negative for headaches.  Hematological: Does not bruise/bleed easily.  Psychiatric/Behavioral: Negative for dysphoric mood. The patient is not nervous/anxious.        Objective:   Physical Exam  Hoarse amb obese bf nad  Wt Readings from Last 3 Encounters:  04/13/15 243 lb 6.4 oz (110.406 kg)  04/03/15 239 lb (108.41 kg)  03/14/15 242 lb 1.6 oz (109.816 kg)    Vital signs reviewed  HEENT: nl dentition, turbinates, and oropharynx. Nl external ear canals without cough reflex  NECK :  without JVD/Nodes/TM/ nl carotid upstrokes bilaterally   LUNGS: no acc muscle use,  Nl contour chest which is clear to A and P bilaterally without cough on insp or exp maneuvers   CV:  RRR  no s3 or murmur or increase in P2, no edema   ABD:  soft and nontender with nl inspiratory excursion in the supine position. No bruits or organomegaly, bowel sounds nl  MS:  Nl gait/ ext warm without deformities, calf tenderness, cyanosis or clubbing No obvious joint restrictions   SKIN: warm and dry without lesions    NEURO:  alert, approp, nl sensorium with  no motor deficits             Assessment & Plan:

## 2015-04-13 NOTE — Patient Instructions (Addendum)
Plan A = Automatic = Symbicort 160 Take 2 puffs first thing in am and then another 2 puffs about 12 hours later                                     Singulair 10 mg each day   Plan B = Backup Only use your albuterol as a rescue medication to be used if you can't catch your breath by resting or doing a relaxed purse lip breathing pattern.  - The less you use it, the better it will work when you need it. - Ok to use up to 2 puffs  every 4 hours if you must but call for appointment if use goes up over your usual need - Don't leave home without it !!  (think of it like the spare tire for your car)   Work on maintaining perfect inhaler technique:  relax and gently blow all the way out then take a nice smooth deep breath back in, triggering the inhaler at same time you start breathing in.  Hold for up to 5 seconds if you can. Blow out thru nose. Rinse and gargle with water when done      Continue pepcid 20 mg at bedtime and as needed during the day  GERD (REFLUX)  is an extremely common cause of respiratory symptoms just like yours , many times with no obvious heartburn at all.    It can be treated with medication, but also with lifestyle changes including elevation of the head of your bed (ideally with 6 inch  bed blocks),  Smoking cessation, avoidance of late meals, excessive alcohol, and avoid fatty foods, chocolate, peppermint, colas, red wine, and acidic juices such as orange juice.  NO MINT OR MENTHOL PRODUCTS SO NO COUGH DROPS  USE SUGARLESS CANDY INSTEAD (Jolley ranchers or Stover's or Life Savers) or even ice chips will also do - the key is to swallow to prevent all throat clearing. NO OIL BASED VITAMINS - use powdered substitutes.    Discuss with your gyn alternatives to your birth control pills which promote more reflux    Please schedule a follow up office visit in 4 weeks, sooner if needed with all your medications and your drug formulary in hand

## 2015-04-14 NOTE — Assessment & Plan Note (Signed)
Body mass index is 39.3    Lab Results  Component Value Date   TSH 1.429 08/25/2014     Contributing to gerd tendency/ doe/reviewed the need and the process to achieve and maintain neg calorie balance > defer f/u primary care including intermittently monitoring thyroid status

## 2015-04-14 NOTE — Assessment & Plan Note (Addendum)
-   04/13/2015  extensive coaching HFA effectiveness =    90%   - NO 04/13/2015  = 119 3 d off symbicort  - Spirometry 04/13/2015  FEV1 2.19 (78%)  Ratio 65    DDX of  difficult airways management almost all start with A and  include Adherence, Ace Inhibitors, Acid Reflux, Active Sinus Disease, Alpha 1 Antitripsin deficiency, Anxiety masquerading as Airways dz,  ABPA,  Allergy(esp in young), Aspiration (esp in elderly), Adverse effects of meds,  Active smokers, A bunch of PE's (a small clot burden can't cause this syndrome unless there is already severe underlying pulm or vascular dz with poor reserve) plus two Bs  = Bronchiectasis and Beta blocker use..and one C= CHF  Adherence is always the initial "prime suspect" and is a multilayered concern that requires a "trust but verify" approach in every patient - starting with knowing how to use medications, especially inhalers, correctly, keeping up with refills and understanding the fundamental difference between maintenance and prns vs those medications only taken for a very short course and then stopped and not refilled.  - - The proper method of use, as well as anticipated side effects, of a metered-dose inhaler are discussed and demonstrated to the patient. Improved effectiveness after extensive coaching during this visit to a level of approximately 90 % from a baseline of 75 %  - cost also an issue: Formulary restrictions will be an ongoing challenge for the forseable future and I would be happy to pick an alternative if the pt will first  provide me a list of them but pt  will need to return here for training for any new device that is required eg dpi vs hfa vs respimat.   In meantime we can always provide samples so the patient never runs out of any needed respiratory medications.   Allergic component strongly suggested by high NO off symb only 3 days > continue singulair/ avoid dogs/allergy testing later if needed   ? Acid (or non-acid) GERD > always  difficult to exclude as up to 75% of pts in some series report no assoc GI/ Heartburn symptoms and 100% in pts with overt HB as is the case here in pt with obesity/bcp's as ongoing risk> rec max (24h)  acid suppression and diet restrictions/ reviewed and instructions given in writing.   Total time devoted to counseling  = 35/1063m review case with pt/ discussion of options/alternatives/ personally creating in presence of pt  then going over specific  Instructions directly with the pt including how to use all of the meds but in particular covering each new medication in detail (see avs)

## 2015-05-11 ENCOUNTER — Ambulatory Visit: Payer: PPO | Admitting: Internal Medicine

## 2015-06-13 ENCOUNTER — Emergency Department (HOSPITAL_COMMUNITY)
Admission: EM | Admit: 2015-06-13 | Discharge: 2015-06-14 | Disposition: A | Discharge status: Home / Self Care | Payer: PPO | Attending: Emergency Medicine | Admitting: Emergency Medicine

## 2015-06-13 ENCOUNTER — Encounter (HOSPITAL_COMMUNITY): Payer: Self-pay | Admitting: Emergency Medicine

## 2015-06-13 DIAGNOSIS — Z7952 Long term (current) use of systemic steroids: Secondary | ICD-10-CM | POA: Insufficient documentation

## 2015-06-13 DIAGNOSIS — Z87891 Personal history of nicotine dependence: Secondary | ICD-10-CM | POA: Diagnosis not present

## 2015-06-13 DIAGNOSIS — J45901 Unspecified asthma with (acute) exacerbation: Secondary | ICD-10-CM | POA: Diagnosis not present

## 2015-06-13 DIAGNOSIS — R0602 Shortness of breath: Secondary | ICD-10-CM | POA: Diagnosis not present

## 2015-06-13 DIAGNOSIS — J45909 Unspecified asthma, uncomplicated: Secondary | ICD-10-CM | POA: Diagnosis not present

## 2015-06-13 MED ORDER — ALBUTEROL SULFATE (2.5 MG/3ML) 0.083% IN NEBU
5.0000 mg | INHALATION_SOLUTION | Freq: Once | RESPIRATORY_TRACT | Status: AC
  Administered 2015-06-13: 5 mg via RESPIRATORY_TRACT
  Filled 2015-06-13: qty 6

## 2015-06-13 NOTE — ED Notes (Signed)
Pt reports she has been sick all week but her and her family were grilling out today and believes the smoke has irritated her breathing.  History of asthma.  Pt states this feels like her normal asthma attacks. No relief with inhaler

## 2015-06-14 ENCOUNTER — Emergency Department (HOSPITAL_COMMUNITY): Payer: PPO

## 2015-06-14 DIAGNOSIS — R0602 Shortness of breath: Secondary | ICD-10-CM | POA: Diagnosis not present

## 2015-06-14 MED ORDER — PREDNISONE 50 MG PO TABS: ORAL_TABLET | ORAL | Status: DC

## 2015-06-14 MED ORDER — ALBUTEROL SULFATE HFA 108 (90 BASE) MCG/ACT IN AERS
1.0000 | INHALATION_SPRAY | RESPIRATORY_TRACT | Status: DC | PRN
  Administered 2015-06-14: 2 via RESPIRATORY_TRACT
  Filled 2015-06-14: qty 6.7

## 2015-06-14 MED ORDER — MONTELUKAST SODIUM 10 MG PO TABS: 10.0000 mg | ORAL_TABLET | Freq: Every day | ORAL | Status: DC

## 2015-06-14 MED ORDER — BUDESONIDE-FORMOTEROL FUMARATE 160-4.5 MCG/ACT IN AERO: 2.0000 | INHALATION_SPRAY | Freq: Two times a day (BID) | RESPIRATORY_TRACT | Status: DC

## 2015-06-14 MED ORDER — PREDNISONE 20 MG PO TABS
60.0000 mg | ORAL_TABLET | Freq: Once | ORAL | Status: AC
  Administered 2015-06-14: 60 mg via ORAL
  Filled 2015-06-14: qty 3

## 2015-06-14 MED ORDER — IPRATROPIUM-ALBUTEROL 0.5-2.5 (3) MG/3ML IN SOLN
3.0000 mL | Freq: Once | RESPIRATORY_TRACT | Status: AC
Start: 2015-06-14 — End: 2015-06-14
  Administered 2015-06-14: 3 mL via RESPIRATORY_TRACT
  Filled 2015-06-14: qty 3

## 2015-06-14 NOTE — Discharge Instructions (Signed)

## 2015-06-14 NOTE — ED Provider Notes (Signed)
CSN: [id number]     Arrival date & time 06/13/15  2157 History   First MD Initiated Contact with Patient 06/14/15 0013     Chief Complaint  Patient presents with  . Asthma   HPI  [name] is a 35 year old female with PMHx of asthma presenting with shortness of breath and wheezing. Patient states that she has been out of her daily asthma medications including Symbicort and Singulair for the past week. She has had worsening shortness of breath and wheezing at baseline. She has used a nebulizer at home which resolved the symptoms. She was at a family function today and smoke from a grill caused acute worsening of shortness of breath and wheezing. She attempted her nebulizer treatment with no relief of symptoms. She endorses chest tightness but this symptom resolved after the nebulizer at home. She states that she does not have a rescue inhaler at this time. She is followed by pulmonology but states that they are having difficulty with finances and affording her medicines. She has no other complaints today.  Past Medical History  Diagnosis Date  . Asthma      reservations to 2-3 timmes  a year that required ED visits/hospitalizations. Never intubated  . GERD (gastroesophageal reflux disease)   . Seasonal allergies    Past Surgical History  Procedure Laterality Date  . Cesarean section    . Cesarean section N/A 10/25/2012    Procedure: CESAREAN SECTION;  Surgeon: Dorien Chihuahuaara J. Richardson Doppole, MD;  Location: [facility];  Service: Obstetrics;  Laterality: N/A;  . Cesarean section N/A 03/05/2014    Procedure: CESAREAN SECTION;  Surgeon: Purcell NailsAngela Y Roberts, MD;  Location: [facility];  Service: Obstetrics;  Laterality: N/A;   Family History  Problem Relation Age of Onset  . Hypertension Mother   . Asthma Father   . Emphysema Maternal Grandfather   . Allergies Maternal Grandmother   . Asthma Maternal Grandmother   . Allergies Mother    Social History  Substance Use Topics  . Smoking status: Former Smoker -- 0.30  packs/day for .5 years    Types: Cigarettes    Quit date: 01/15/2001  . Smokeless tobacco: Never Used  . Alcohol Use: No     Comment: occasionaly   OB History    Gravida Para Term Preterm AB TAB SAB Ectopic Multiple Living   7 3 3  0 4 2 2  0 0 3     Review of Systems  All other systems reviewed and are negative.     Allergies  Iohexol and Moxifloxacin  Home Medications   Prior to Admission medications   Medication Sig Start Date End Date Taking? Authorizing Provider  albuterol (PROVENTIL HFA;VENTOLIN HFA) 108 (90 Base) MCG/ACT inhaler Inhale 2 puffs into the lungs every 6 (six) hours as needed for wheezing or shortness of breath. 03/14/15  Yes Latrelle DodrillBrittany J McIntyre, MD  budesonide-formoterol [facility](SYMBICORT) 160-4.5 MCG/ACT inhaler Inhale 2 puffs into the lungs 2 (two) times daily. 03/14/15  Yes Latrelle DodrillBrittany J McIntyre, MD  cetirizine (ZYRTEC) 10 MG tablet Take 10 mg by mouth daily.   Yes Historical Provider, MD  famotidine (PEPCID) 20 MG tablet Take 20 mg by mouth at bedtime as needed for heartburn or indigestion.   Yes Historical Provider, MD  montelukast (SINGULAIR) 10 MG tablet Take 1 tablet (10 mg total) by mouth at bedtime. 03/14/15  Yes Latrelle DodrillBrittany J McIntyre, MD  budesonide-formoterol [facility](SYMBICORT) 160-4.5 MCG/ACT inhaler Inhale 2 puffs into the lungs 2 (two) times daily. 06/14/15  Kataleyah Carducci, PA-C  montelukast (SINGULAIR) 10 MG tablet Take 1 tablet (10 mg total) by mouth at bedtime. 06/14/15   Julious Langlois, PA-C  norethindrone (ORTHO MICRONOR) 0.35 MG tablet Take 1 tablet (0.35 mg total) by mouth daily. 03/14/15   [name], MD  predniSONE (DELTASONE) 50 MG tablet Take 1 tablet once a day for 5 days 06/14/15   Rolm Gala Rockie Vawter, PA-C  Prenatal Vit-Fe Fumarate-FA (MULTIVITAMIN-PRENATAL) 27-0.8 MG TABS Take 1 tablet by mouth daily at 12 noon. 03/14/12   Everrett Coombe, DO   BP 114/62 mmHg  Pulse 75  Temp(Src) 98.9 F (37.2 C) (Oral)  Resp 16  Ht  (1.676 m)  Wt 111.131 kg  BMI  39.56 kg/m2  SpO2 98%  LMP 05/27/2015 (Approximate) Physical Exam  Constitutional: She appears well-developed and well-nourished. No distress.  HENT:  Head: Normocephalic and atraumatic.  Eyes: Conjunctivae are normal. Right eye exhibits no discharge. Left eye exhibits no discharge. No scleral icterus.  Neck: Normal range of motion.  Cardiovascular: Normal rate and regular rhythm.   Pulmonary/Chest: Effort normal. No respiratory distress. She has wheezes.  Breathing unlabored. Diffuse wheezing in all lung fields. No respiratory distress.  Musculoskeletal: Normal range of motion.  Neurological: She is alert. Coordination normal.  Skin: Skin is warm and dry.  Psychiatric: She has a normal mood and affect. Her behavior is normal.  Nursing note and vitals reviewed.   ED Course  Procedures (including critical care time) Labs Review Labs Reviewed - No data to display  Imaging Review Dg Chest 2 View  06/14/2015  CLINICAL DATA:  Initial valuation for acute shortness of breath. History of asthma. EXAM: CHEST  2 VIEW COMPARISON:  Prior radiograph from 01/20/2011. FINDINGS: Cardiac and mediastinal silhouettes are stable in size and contour, and remain within normal limits. Lungs are normally inflated. No focal infiltrate, pulmonary edema, or pleural effusion. No pneumothorax. No acute osseus abnormality. IMPRESSION: No active cardiopulmonary disease. Electronically Signed   By: Rise Mu M.D.   On: 06/14/2015 03:01   I have personally reviewed and evaluated these images and lab results as part of my medical decision-making.   EKG Interpretation None      MDM   Final diagnoses:  Asthma exacerbation    Patient presenting with asthma exacerbation. Treated with duoneb and proventil in ED with significant improvement in lung sounds and symptoms. Patient ambulated in ED with O2 saturations maintained >90. There are no current signs of respiratory distress. Prednisone given in the  ED and pt will be discharged with 5 day burst and albuterol. Will refill singulair and symbicort.  Pt states they are breathing at baseline. Pt has been instructed to follow up with their PCP regarding today's ED visit. Return precautions given in discharge paperwork and discussed with pt at bedside. Pt stable for discharge      Alveta Heimlich, PA-C 06/14/15 0344  Dione Booze, MD 06/14/15 [phone]

## 2015-06-14 NOTE — ED Notes (Signed)
Pulse ox while ambulating 96% 

## 2015-06-14 NOTE — ED Notes (Signed)
Pt reports understanding of discharge information. No questions at time of discharge 

## 2015-06-21 ENCOUNTER — Other Ambulatory Visit: Payer: Self-pay

## 2015-06-21 DIAGNOSIS — Z79899 Other long term (current) drug therapy: Secondary | ICD-10-CM

## 2015-06-21 NOTE — Patient Outreach (Addendum)
[facility]) Care Management  06/21/2015  [name] 1980-08-06 [id number]   SUBJECTIVE: Telephone call to patient  Regarding [employer] referral.  HIPAA verified with patient. Discussed and offered [facility] care [facility] services to patient. Patient states she is having trouble affording her asthma medications.  Patient states she was in the emergency room recently on 06/13/15 due to an asthma flare up. Patient states she her asthma is flaring up more because she is unable to afford the inhalers.  Patient states the symbicort is $90 per month and her proair is $45. Patients states she has inquired about Medicaid but has been told she does not qualify.  Patient verbally agreed to [facility] services from pharmacist.  Patient states she does not need nursing services at this time. Patient states she is followed closely by Dr. Sherene SiresWert her pulmonologist.   ASSESSMENT:  [employer] referral for medications assistance.   PLAN; RNCM [facility].   George Inaavina Mardel Grudzien RN,BSN,CCM [facility] Telephonic  [phone]

## 2015-06-22 ENCOUNTER — Ambulatory Visit (INDEPENDENT_AMBULATORY_CARE_PROVIDER_SITE_OTHER): Payer: PPO | Admitting: Internal Medicine

## 2015-06-22 ENCOUNTER — Telehealth: Payer: Self-pay | Admitting: Family Medicine

## 2015-06-22 ENCOUNTER — Encounter: Payer: Self-pay | Admitting: Internal Medicine

## 2015-06-22 VITALS — BP 126/78 | HR 88 | Ht 66.0 in | Wt 245.0 lb

## 2015-06-22 DIAGNOSIS — J4540 Moderate persistent asthma, uncomplicated: Secondary | ICD-10-CM

## 2015-06-22 DIAGNOSIS — E669 Obesity, unspecified: Secondary | ICD-10-CM

## 2015-06-22 LAB — NITRIC OXIDE: Nitric Oxide: 107

## 2015-06-22 MED ORDER — PREDNISONE 10 MG PO TABS: ORAL_TABLET | ORAL | Status: DC

## 2015-06-22 NOTE — Patient Instructions (Signed)
2 weeks prior to surgery or any time your breathing gets worse, start Prednisone 10 mg take  4 each am x 2 days,   2 each am x 2 days,  1 each am x 2 days and stop   You are cleared for surgery but must keep up with refills on symbicort 160 2 every 12 hours   Please schedule a follow up visit in 3 months but call sooner if needed

## 2015-06-22 NOTE — Progress Notes (Signed)
Subjective:    Patient ID: [name], female    DOB: 1980-07-01,   MRN: [id number]  HPI  [age] yobf with asthma since age 89 on maint rx ever since then quit smoking 2004 and no better intially seen in pulmonary clinic by Dr Sung AmabileSimonds early 2000s [facility] ER with dtca asthma she attributes to allergies self referred to Pulmonary clinic 04/13/2015 p last ov:   02/22/2011 recs Pepcid 20 mg one at bedtime GERD diet / lifestyle    Plan A is to use the symbicort 160 Take 2 puffs first thing in am and then another 2 puffs about 12 hours later and the singulair at night If you can't catch your breath, go to Plan B= ok to use ventolin hfa if needed up to every 4 hours with the goal of less than twice weekly If you still catch your breath Plan C = use the nebulizer up to 4 hours and call us right away for appt to see me or Tammy Nurse Practioner. F/u 4 weeks > no show     04/13/2015 1st [employer] [facility] office visit/ [name]  maint rx symbicort 160 2 bid Chief Complaint  Patient presents with  . Pulmonary Consult    Self referral Asthma- former pt, last seen 2013  while on maint on symbicort 160 2bid but two er trips both p exp to dogs while living at [place] house but getting ready to move.  symbicort is 90 dollars/ proair is 45 and uses maybe one every 6 m and used 3 x then to ER  Off symb x 3 days  Overt HB on bcps rec Plan A = Automatic = Symbicort 160 Take 2 puffs first thing in am and then another 2 puffs about 12 hours later                                     Singulair 10 mg each day  Plan B = Backup Only use your albuterol as a rescue medication  Work on maintaining perfect inhaler technique:   Continue pepcid 20 mg at bedtime and as needed during the day GERD (REFLUX)  is an extremely common cause of respiratory symptoms just like yours , many times with no obvious heartburn at all.  Discuss with your gyn alternatives to your birth control pills which promote more  reflux     06/22/2015  f/u ov/[name] re: preop asthma eval for breast reduciton   Chief Complaint  Patient presents with  . Follow-up    Pt states had to go to ED 06/14/15 with asthma flare. Breathing is now back at her normal baseline. She uses albuterol inhaler 1 to 2 x per wk on average.   off med x 2 weeks before attack p exp to smoke from grill > Er as above  Back on symb 2 bid and singulair now Able to sleep ok on 2 pillows s noct resp c/o's  No obvious day to day or daytime variability or assoc excess/ purulent sputum or mucus plugs or hemoptysis or cp or chest tightness, subjective wheeze or overt sinus or hb symptoms. No unusual exp hx or h/o childhood pna/ asthma or knowledge of premature birth.  Sleeping ok without nocturnal  or early am exacerbation  of respiratory  c/o's or need for noct saba. Also denies any obvious fluctuation of symptoms with weather or environmental changes or  other aggravating or alleviating factors except as outlined above   Current Medications, Allergies, Complete Past Medical History, Past Surgical History, Family History, and Social History were reviewed in [employer] record.  ROS  The following are not active complaints unless bolded sore throat, dysphagia, dental problems, itching, sneezing,  nasal congestion or excess/ purulent secretions, ear ache,   fever, chills, sweats, unintended wt loss, classically pleuritic or exertional cp,  orthopnea pnd or leg swelling, presyncope, palpitations, abdominal pain, anorexia, nausea, vomiting, diarrhea  or change in bowel or bladder habits, change in stools or urine, dysuria,hematuria,  rash, arthralgias, visual complaints, headache, numbness, weakness or ataxia or problems with walking or coordination,  change in mood/affect or memory.                            Objective:   Physical Exam  amb obese bf nad  06/22/2015          245  04/13/15 243 lb 6.4 oz (110.406 kg)  04/03/15 239  lb (108.41 kg)  03/14/15 242 lb 1.6 oz (109.816 kg)    Vital signs reviewed  HEENT: nl dentition, turbinates, and oropharynx. Nl external ear canals without cough reflex   NECK :  without JVD/Nodes/TM/ nl carotid upstrokes bilaterally   LUNGS: no acc muscle use,  Nl contour chest  With minimal insp and exp rhonchi   CV:  RRR  no s3 or murmur or increase in P2, no edema   ABD:  soft and nontender with nl inspiratory excursion in the supine position. No bruits or organomegaly, bowel sounds nl  MS:  Nl gait/ ext warm without deformities, calf tenderness, cyanosis or clubbing No obvious joint restrictions   SKIN: warm and dry without lesions    NEURO:  alert, approp, nl sensorium with  no motor deficits        I personally reviewed images and agree with radiology impression as follows:  CXR:  06/14/15 No active cardiopulmonary disease.     Assessment & Plan:

## 2015-06-22 NOTE — Assessment & Plan Note (Signed)
-   NO 04/13/2015  = 119 3 d off symbicort  - Spirometry 04/13/2015  FEV1 2.19 (78%)  Ratio 65   - 06/22/2015  After extensive coaching HFA effectiveness =    90%  - FENO 06/22/2015  107   DDX of  difficult airways management almost all start with A and  include Adherence, Ace Inhibitors, Acid Reflux, Active Sinus Disease, Alpha 1 Antitripsin deficiency, Anxiety masquerading as Airways dz,  ABPA,  Allergy(esp in young), Aspiration (esp in elderly), Adverse effects of meds,  Active smokers, A bunch of PE's (a small clot burden can't cause this syndrome unless there is already severe underlying pulm or vascular dz with poor reserve) plus two Bs  = Bronchiectasis and Beta blocker use..and one C= CHF  Adherence is always the initial "prime suspect" and is a multilayered concern that requires a "trust but verify" approach in every patient - starting with knowing how to use medications, especially inhalers, correctly, keeping up with refills and understanding the fundamental difference between maintenance and prns vs those medications only taken for a very short course and then stopped and not refilled.  - - The proper method of use, as well as anticipated side effects, of a metered-dose inhaler are discussed and demonstrated to the patient. Improved effectiveness after extensive coaching during this visit to a level of approximately 90 % from a baseline of 75 % > continue symbicort 160 2bid  ? Allergy > continue singulair, consider allergy eval if having breakthru on consistent sybmicort 160 and singulair and pred x 6 days for flare or 2 weeks preop whichever comes first   I had an extended discussion with the patient reviewing all relevant studies completed to date and  lasting 15 to 20 minutes of a 25 minute visit    Each maintenance medication was reviewed in detail including most importantly the difference between maintenance and prns and under what circumstances the prns are to be triggered using an action  plan format that is not reflected in the computer generated alphabetically organized AVS.    Please see instructions for details which were reviewed in writing and the patient given a copy highlighting the part that I personally wrote and discussed at today's ov.

## 2015-06-22 NOTE — Telephone Encounter (Signed)
Pt is calling and would like a referral to see Dr. Randall [facility]issJagedeesh [facility] for he weight issues. jw

## 2015-06-22 NOTE — Telephone Encounter (Signed)
Dr. Verl DickerGanji's office does do some weight loss management This is reasonable Will enter referral Please inform patient she should hear about an appointment  Latrelle DodrillBrittany J Natali Lavallee, MD

## 2015-06-23 NOTE — Telephone Encounter (Signed)
Pt informed. Deseree Blount, CMA  

## 2015-06-28 ENCOUNTER — Telehealth: Payer: Self-pay | Admitting: Internal Medicine

## 2015-06-28 NOTE — Telephone Encounter (Signed)
Patient called checking on status of Symbicort samples-prm

## 2015-06-28 NOTE — Telephone Encounter (Signed)
LMTCB

## 2015-06-28 NOTE — Telephone Encounter (Signed)
1 sample up front for pick up  Pt aware and nothing further needed

## 2015-06-30 ENCOUNTER — Other Ambulatory Visit: Payer: Self-pay | Admitting: Pharmacist

## 2015-06-30 NOTE — Patient Outreach (Signed)
[facility]) Care Management  [facility]   06/30/2015  [name] [birth year] [id number]  Subjective:  [facility] referral received from [place], [facility] RN Telephonic regarding patient assistance options for patient.    Spoke with patient via phone and patient verified name and date of birth.    Patient reports she received samples of Symbicort from Dr Thurston Hole office and that she is unable to afford $90 co-pay for Symbicort.    Per [facility], co-pay was $90 because it was a 60 day supply.   Patient reports she thought she had help with prescription drug costs in 2016, but she isn't sure if it was via [facility] Extra Help or Medicaid.    Patient reports she has likely not reached the 3% out-of-pocket spend requirement that [employer] (Symbicort) patient assistance has for Medicare Part D beneficiaries.     Objective:   Current Medications: Current Outpatient Prescriptions  Medication Sig Dispense Refill  . albuterol (PROAIR HFA) 108 (90 Base) MCG/ACT inhaler Inhale 2 puffs into the lungs every 6 (six) hours as needed for wheezing or shortness of breath.    . budesonide-formoterol (SYMBICORT) 160-4.5 MCG/ACT inhaler Inhale 2 puffs into the lungs 2 (two) times daily. 2 Inhaler 5  . cetirizine (ZYRTEC) 10 MG tablet Take 10 mg by mouth daily.    . famotidine (PEPCID) 20 MG tablet Take 20 mg by mouth at bedtime as needed for heartburn or indigestion.    . montelukast (SINGULAIR) 10 MG tablet Take 1 tablet (10 mg total) by mouth at bedtime. 30 tablet 5  . predniSONE (DELTASONE) 10 MG tablet Take  4 each am x 2 days,   2 each am x 2 days,  1 each am x 2 days and stop 14 tablet 0   No current facility-administered medications for this visit.    Functional Status: No flowsheet data found.  Fall/Depression Screening: PHQ 2/9 Scores 06/21/2015 03/14/2015 09/23/2014 08/17/2014  PHQ - 2 Score 0 0 0 0    Assessment:  Medication assistance:  Called [facility] and was told co-pay for Symbicort was $90 because it was a 60 day supply.  Per review of [place] MA-PDP summary of benefits and preferred drug list, Symbicort and Proair both appear to be Tier 3 with $45 co-pay/30 day supply.    Patient likely will not be eligible for [employer] patient assistance program (Symbicort) since she reports she has not reached the 3% out-of-pocket spend requirement on prescription drug costs for calendar year.   [employer] Extra Help:  Patient thinks she may have had this last year and not completed some paperwork for it.  Advised patient to contact [employer] to determine if she did have it last year and why she lost Extra Help if she previously received it.  She was provided with [facility] phone number and agreed to call.    If patient is unable to obtain [employer] Extra Help could see if Dr Sherene Sires feels an alternative agent, such as Elwin Sleight is appropriate for patient and attempt to obtain manufacturer assistance with it from [employer].    Plan:  Call patient in about 1-2 weeks to see if she followed-up with [employer].  [employer] Extra Help would be the first step for patient assistance for her as it would help with all of her medication costs.   Will route this note to patient's PCP to make her aware of [facility] working with patient.    Tommye Standard, PharmD, BCACP Clinical  Pharmacist [facility] [phone]

## 2015-07-01 ENCOUNTER — Encounter: Payer: Self-pay | Admitting: Pharmacist

## 2015-07-07 ENCOUNTER — Other Ambulatory Visit: Payer: Self-pay | Admitting: Pharmacist

## 2015-07-07 NOTE — Patient Outreach (Signed)
[facility]) Care Management  07/07/2015  [name] 1980-04-12 [id number]  Phone call to patient to follow-up medication assistance.  She verified name and date of birth.  Patient reports that she has called [employer] regarding her Extra help status and "some other issues" and reports she is planning to call [employer] again to follow-up.    Patient reports that she presently has her inhalers and has enough medication at this time.    Encouraged patient to continue to follow-up with [employer] as she reports she thought she had Extra Help last year.  If she is not eligible for [employer] extra help will need to explore therapeutic alternatives that may have a manufacturer patient assistance program.   She will have likely not met the out-of-pocket spend requirement to apply for [place] patient assistance.    Plan:  Patient agrees to call [employer] to follow-up her [employer] extra help status.    If she is not able to get [employer] extra help will need to discuss with Dr Melvyn Novas possible therapeutic alternatives with manufacturer patient assistance programs.    Patient agrees to call [facility] Pharmacist if she needs anything and to a follow-up call in 2 weeks.    Karrie Meres, PharmD, [place] [phone]

## 2015-07-13 DIAGNOSIS — Z7689 Persons encountering health services in other specified circumstances: Secondary | ICD-10-CM | POA: Diagnosis not present

## 2015-07-13 DIAGNOSIS — E669 Obesity, unspecified: Secondary | ICD-10-CM | POA: Diagnosis not present

## 2015-07-21 ENCOUNTER — Other Ambulatory Visit: Payer: Self-pay | Admitting: Pharmacist

## 2015-07-21 NOTE — Patient Outreach (Signed)
Attempted to reach patient via phone to follow-up if she contacted [employer] regarding Extra Help status.  Patient's phone number in chart gave message that phone was not accepting incoming calls at this time.   Plan:  Will attempt to reach patient next week again.   Tommye StandardKevin Hye Trawick, PharmD, [facility]ACP Clinical Pharmacist [facility] [phone]

## 2015-07-27 ENCOUNTER — Other Ambulatory Visit: Payer: Self-pay | Admitting: Pharmacist

## 2015-07-27 NOTE — Patient Outreach (Signed)
Second unsuccessful attempt to reach patient, no answer, unable to leave message on phone.  Attempting to reach patient to follow-up medication patient assistance.   Plan:  Will make another attempt to reach patient next week.  Tommye StandardKevin Mirelle Biskup, PharmD, [facility] Clinical Pharmacist [facility] [phone]

## 2015-08-02 ENCOUNTER — Other Ambulatory Visit: Payer: Self-pay | Admitting: Pharmacist

## 2015-08-02 ENCOUNTER — Encounter: Payer: Self-pay | Admitting: Pharmacist

## 2015-08-02 NOTE — Patient Outreach (Signed)
Third unsuccessful attempt to reach patient via phone to follow-up patient assistance.  Phone gave a message that it was "not accepting incoming calls."   Given this is third unsuccessful attempt to reach patient, will mail patient an outreach letter.   Plan:  Will send outreach letter.  If no reply in 10 business days, will close case.   Tommye StandardKevin Maleki Hippe, PharmD, [facility]ACP Clinical Pharmacist [facility] [phone]

## 2015-08-17 ENCOUNTER — Other Ambulatory Visit: Payer: Self-pay | Admitting: Pharmacist

## 2015-08-17 ENCOUNTER — Encounter: Payer: Self-pay | Admitting: Pharmacist

## 2015-08-17 NOTE — Patient Outreach (Signed)
[facility]) Care Management  08/17/2015  [name] May 29, 1980 [id number]  Patient was initially referred to [facility] for evaluation of medication patient assistance.    There have been three unsuccessful phone outreach attempts and an outreach letter was sent to patient.  No reply/response from patient in 10 business days since outreach letter sent.    Plan:  Will close pharmacy case due to patient not responding to calls/outreach letter.    Will send PCP MD closure letter.  Will send in-basket to [facility] CM assistant to make aware of case closure.   Tommye Standard, PharmD, [facility] Clinical Pharmacist [facility] [phone]

## 2015-09-22 ENCOUNTER — Ambulatory Visit: Payer: PPO | Admitting: Internal Medicine

## 2015-11-26 ENCOUNTER — Other Ambulatory Visit: Payer: Self-pay | Admitting: Family Medicine

## 2016-03-09 ENCOUNTER — Other Ambulatory Visit: Payer: Self-pay | Admitting: Family Medicine

## 2016-03-09 NOTE — Telephone Encounter (Deleted)
Patient stated over the phone she is out of meds

## 2016-03-09 NOTE — Telephone Encounter (Signed)
Patient's mom was being seen and mom was on phone with daughter who was requesting refill on her meds. Patient stated she is out of all meds. Best # if questions: [phone]. [facility] [address]

## 2016-03-12 NOTE — Telephone Encounter (Signed)
What medications specifically does patient need refilled? Please inquire and let me know.  Latrelle DodrillBrittany J Lamoine Fredricksen, MD

## 2016-03-14 ENCOUNTER — Other Ambulatory Visit: Payer: Self-pay | Admitting: Family Medicine

## 2016-03-14 NOTE — Telephone Encounter (Signed)
Pt needs refills on Singulair and Symbicort. [facility] uses [facility] [address]. ep

## 2016-03-14 NOTE — Telephone Encounter (Signed)
Attempted to call pt no voicemail. Will try again later. [name] [name], CMA

## 2016-03-15 NOTE — Telephone Encounter (Signed)
Will refill, but please advise patient to schedule follow up visit with me, it's been >1 year since she's been here Cynthia DodrillBrittany J Nova Evett, MD

## 2016-03-15 NOTE — Telephone Encounter (Signed)
Attempted to call pt again no voicemail. Deseree [name], CMA

## 2016-03-27 NOTE — Telephone Encounter (Signed)
Called patient, no answer, no voicemail

## 2016-04-25 ENCOUNTER — Encounter: Payer: Self-pay | Admitting: Family Medicine

## 2016-04-25 ENCOUNTER — Ambulatory Visit (INDEPENDENT_AMBULATORY_CARE_PROVIDER_SITE_OTHER): Payer: Medicare Other | Admitting: Family Medicine

## 2016-04-25 DIAGNOSIS — J4540 Moderate persistent asthma, uncomplicated: Secondary | ICD-10-CM

## 2016-04-25 MED ORDER — PREDNISONE 20 MG PO TABS: 20.0000 mg | ORAL_TABLET | Freq: Every day | ORAL | 0 refills | Status: AC

## 2016-04-25 NOTE — Patient Instructions (Signed)
It was nice meeting you today! You were seen in clinic for asthma which was likely triggered by contact with your sister's dog.  On exam you had some wheezing and I have prescribed Prednisone for you to take daily for 7 days.  In addition, you can continue to take your asthma medications and use the albuterol as needed.    If symptoms worsen please make an appointment to be seen in clinic.  Call with any questions.   Be well,  Freddrick March, MD

## 2016-04-25 NOTE — Progress Notes (Signed)
   Subjective:   Patient ID: [name]    DOB: 05/17/1980, 36 y.o. female   MRN: [id number]  CC: asthma   HPI: [name] is a 36 y.o. female who presents to clinic today for asthma. Reports she went to [place] last week.  Sister has a dog, patient unsure if that's what started it.  First had symptoms of allergies which have now progressed.  Entire last week she had been coughing, productive with green-yellow sputum. Reports sometimes her cough is dry.  Also with sneezing.  Has not been sick with any URI symptoms in about a year.   Reports diagnosed with asthma since age  26-8.  Uses rescue inhaler hardly ever.  This past week has had to use it about 4x.  Takes Singulair, Zyrtec daily and symbicort inhaler.  Reports good compliance.  Cough at night for last 2 nights.  Has had asthma exacerbations in the past requiring hospitalizations.  No prior history of intubations.  Has been given a steroid taper in the past which has helped.   ROS:   Denies fevers, runny nose, congestion, body aches, chills.   PMFSH: Pertinent past medical, surgical, family, and social history were reviewed and updated as appropriate. Smoking status reviewed.  Medications reviewed.  Objective:   BP 138/80   Pulse 72   Temp 98.9 F (37.2 C) (Oral)   Wt 233 lb (105.7 kg)   LMP 04/08/2016   SpO2 98%   BMI 37.61 kg/m  Vitals and nursing note reviewed.  General: 36 yo F well nourished, in NAD HEENT:NCAT, MMM, o/p clear Neck: supple CV: RRR no MRG  Lungs: diffuse wheezing throughout, good air movement, no increased work of breathing noted  Abdomen: soft, NT, ND, +bs Skin: no cyanosis or pallor noted  Extremities: no edema or tenderness   Assessment & Plan:   Moderate persistent allergic asthma without complication Symptoms likely triggered by recent contact with pet dog.  Scattered wheezing throughout lung fields but exam otherwise normal.  No red flags.  O2 sat 98% on RA with comfortable work of breathing.    -Prescribed short course of Prednisone 20 mg daily x 7 days -Patient instructed to call clinic if symptoms not improved -Strict return precautions discussed  Meds ordered this encounter  Medications  . predniSONE (DELTASONE) 20 MG tablet    Sig: Take 1 tablet (20 mg total) by mouth daily with breakfast.    Dispense:  7 tablet    Refill:  0    Freddrick March, MD [facility], PGY-1 04/29/2016 11:22 PM

## 2016-04-29 NOTE — Assessment & Plan Note (Addendum)
Symptoms likely triggered by recent contact with pet dog.  Scattered wheezing throughout lung fields but exam otherwise normal.  No red flags.  O2 sat 98% on RA with comfortable work of breathing.  -Prescribed short course of Prednisone 20 mg daily x 7 days -Patient instructed to call clinic if symptoms not improved -Strict return precautions discussed

## 2016-05-07 ENCOUNTER — Encounter: Payer: Self-pay | Admitting: *Deleted

## 2016-05-08 ENCOUNTER — Ambulatory Visit: Payer: PPO | Admitting: *Deleted

## 2016-07-13 ENCOUNTER — Telehealth: Payer: Self-pay | Admitting: Family Medicine

## 2016-07-13 NOTE — Telephone Encounter (Signed)
Pt called and needs a refill on her : Zyrtec, Pepcid, Singular, ProAir, Symbicort. jw

## 2016-07-17 MED ORDER — CETIRIZINE HCL 10 MG PO TABS: 10.0000 mg | ORAL_TABLET | Freq: Every day | ORAL | 1 refills | Status: DC

## 2016-07-17 MED ORDER — MONTELUKAST SODIUM 10 MG PO TABS: 10.0000 mg | ORAL_TABLET | Freq: Every day | ORAL | 1 refills | Status: DC

## 2016-07-17 MED ORDER — ALBUTEROL SULFATE HFA 108 (90 BASE) MCG/ACT IN AERS: INHALATION_SPRAY | RESPIRATORY_TRACT | 5 refills | Status: DC

## 2016-07-17 MED ORDER — FAMOTIDINE 20 MG PO TABS: 20.0000 mg | ORAL_TABLET | Freq: Every evening | ORAL | 1 refills | Status: DC | PRN

## 2016-07-17 MED ORDER — BUDESONIDE-FORMOTEROL FUMARATE 160-4.5 MCG/ACT IN AERO: 2.0000 | INHALATION_SPRAY | Freq: Two times a day (BID) | RESPIRATORY_TRACT | 3 refills | Status: DC

## 2016-07-17 NOTE — Telephone Encounter (Signed)
rx sent Cynthia Zobrist [name] Axel, MD  

## 2016-08-15 ENCOUNTER — Ambulatory Visit (INDEPENDENT_AMBULATORY_CARE_PROVIDER_SITE_OTHER): Payer: Medicare Other | Admitting: Family Medicine

## 2016-08-15 ENCOUNTER — Encounter: Payer: Self-pay | Admitting: Family Medicine

## 2016-08-15 VITALS — BP 130/78 | HR 62 | Temp 98.4°F | Ht 66.0 in | Wt 238.0 lb

## 2016-08-15 DIAGNOSIS — Z0000 Encounter for general adult medical examination without abnormal findings: Secondary | ICD-10-CM

## 2016-08-15 DIAGNOSIS — J4540 Moderate persistent asthma, uncomplicated: Secondary | ICD-10-CM | POA: Diagnosis not present

## 2016-08-15 NOTE — Progress Notes (Signed)
Date of Visit: 08/15/2016   HPI:  Patient presents today for a well woman exam.   Concerns today: none Periods: seem irregular, sometimes gets 2 in 1 month but is not tracking them consistently Contraception: no birth control presently, abstinent Pelvic symptoms: no vag discharge or pelvic pain Sexual activity: no plans to become active, currently abstinent STD Screening: declines STD screening today Pap smear status: UTD, due March 2019 Exercise: walks, ellipitical, stomach crunches 3 times per week Diet: tries to eat healthy Smoking: no Alcohol: no Drugs: no Mood: good, no SI/HI Dentist: has dentist  Had seen Dr. Jacinto HalimGanji for weight loss & had phentermine prescribed, but has not followed up there recently due to high copays. Lowest weight was 223. Wants to get down to 215 lb so that she can possibly get a breast reduction.  Asthma - rarely using albuterol. Well controleld on current medications (singulair, symbicort, zyrtec)  ROS: See HPI  PMFSH:  Cancers in family: cousins on mother's side with cervical & ovarian cancer, no first degree relatives  PHYSICAL EXAM: BP 130/78   Pulse 62   Temp 98.4 F (36.9 C) (Oral)   Ht 5\' 6"  (1.676 m)   Wt 238 lb (108 kg)   LMP 08/13/2016   SpO2 98%   BMI 38.41 kg/m  Gen: NAD, pleasant, cooperative HEENT: NCAT, PERRL, no palpable thyromegaly or anterior cervical lymphadenopathy Heart: RRR, no murmurs Lungs: CTAB, NWOB Abdomen: soft, nontender to palpation Neuro: grossly nonfocal, speech normal Extremities: No appreciable lower extremity edema bilaterally   ASSESSMENT/PLAN:  Health maintenance:  -STD screening: declines today -pap smear: UTD, due March 2019 -lipid screening: had in 2016 -immunizations: UTD -handout given on health maintenance topics  Abnormal periods It sounds like her periods are occurring at regular intervals, but she is not tracking them well enough to know for sure. Advised tracking them with an app, and  coming in for an appointment if cycles are <24 days in length or >38 days in length. Patient agreeable.  Morbid obesity (HCC) Encouraged follow up with Dr. Jacinto HalimGanji for weight loss. Advised her I don't prescribe phentermine but she is encouraged to continue her current efforts at weight loss.  Moderate persistent allergic asthma without complication Well controlled. Continue current regimen.    FOLLOW UP: Follow up in 1 year for above issues, sooner if needed  [place]renadaBrittany J. Pollie MeyerMcIntyre, MD [facility]

## 2016-08-15 NOTE — Patient Instructions (Signed)
Track your periods in an app If they are happening less than 24 days apart (first day of period to first day of next period) or more than 38 days apart, come in to be seen.  Otherwise follow up in 1 year, sooner if needed.  Be well, Dr. Ardelia Mems   Health Maintenance, Female Adopting a healthy lifestyle and getting preventive care can go a long way to promote health and wellness. Talk with your health care provider about what schedule of regular examinations is right for you. This is a good chance for you to check in with your provider about disease prevention and staying healthy. In between checkups, there are plenty of things you can do on your own. Experts have done a lot of research about which lifestyle changes and preventive measures are most likely to keep you healthy. Ask your health care provider for more information. Weight and diet Eat a healthy diet  Be sure to include plenty of vegetables, fruits, low-fat dairy products, and lean protein.  Do not eat a lot of foods high in solid fats, added sugars, or salt.  Get regular exercise. This is one of the most important things you can do for your health. ? Most adults should exercise for at least 150 minutes each week. The exercise should increase your heart rate and make you sweat (moderate-intensity exercise). ? Most adults should also do strengthening exercises at least twice a week. This is in addition to the moderate-intensity exercise.  Maintain a healthy weight  Body mass index (BMI) is a measurement that can be used to identify possible weight problems. It estimates body fat based on height and weight. Your health care provider can help determine your BMI and help you achieve or maintain a healthy weight.  For females 2 years of age and older: ? A BMI below 18.5 is considered underweight. ? A BMI of 18.5 to 24.9 is normal. ? A BMI of 25 to 29.9 is considered overweight. ? A BMI of 30 and above is considered obese.  Watch  levels of cholesterol and blood lipids  You should start having your blood tested for lipids and cholesterol at 35 years of age, then have this test every 5 years.  You may need to have your cholesterol levels checked more often if: ? Your lipid or cholesterol levels are high. ? You are older than 35 years of age. ? You are at high risk for heart disease.  Cancer screening Lung Cancer  Lung cancer screening is recommended for adults 61-63 years old who are at high risk for lung cancer because of a history of smoking.  A yearly low-dose CT scan of the lungs is recommended for people who: ? Currently smoke. ? Have quit within the past 15 years. ? Have at least a 30-pack-year history of smoking. A pack year is smoking an average of one pack of cigarettes a day for 1 year.  Yearly screening should continue until it has been 15 years since you quit.  Yearly screening should stop if you develop a health problem that would prevent you from having lung cancer treatment.  Breast Cancer  Practice breast self-awareness. This means understanding how your breasts normally appear and feel.  It also means doing regular breast self-exams. Let your health care provider know about any changes, no matter how small.  If you are in your 20s or 30s, you should have a clinical breast exam (CBE) by a health care provider every 1-3 years as  part of a regular health exam.  If you are 40 or older, have a CBE every year. Also consider having a breast X-ray (mammogram) every year.  If you have a family history of breast cancer, talk to your health care provider about genetic screening.  If you are at high risk for breast cancer, talk to your health care provider about having an MRI and a mammogram every year.  Breast cancer gene (BRCA) assessment is recommended for women who have family members with BRCA-related cancers. BRCA-related cancers include: ? Breast. ? Ovarian. ? Tubal. ? Peritoneal  cancers.  Results of the assessment will determine the need for genetic counseling and BRCA1 and BRCA2 testing.  Cervical Cancer Your health care provider may recommend that you be screened regularly for cancer of the pelvic organs (ovaries, uterus, and vagina). This screening involves a pelvic examination, including checking for microscopic changes to the surface of your cervix (Pap test). You may be encouraged to have this screening done every 3 years, beginning at age 75.  For women ages 51-65, health care providers may recommend pelvic exams and Pap testing every 3 years, or they may recommend the Pap and pelvic exam, combined with testing for human papilloma virus (HPV), every 5 years. Some types of HPV increase your risk of cervical cancer. Testing for HPV may also be done on women of any age with unclear Pap test results.  Other health care providers may not recommend any screening for nonpregnant women who are considered low risk for pelvic cancer and who do not have symptoms. Ask your health care provider if a screening pelvic exam is right for you.  If you have had past treatment for cervical cancer or a condition that could lead to cancer, you need Pap tests and screening for cancer for at least 20 years after your treatment. If Pap tests have been discontinued, your risk factors (such as having a new sexual partner) need to be reassessed to determine if screening should resume. Some women have medical problems that increase the chance of getting cervical cancer. In these cases, your health care provider may recommend more frequent screening and Pap tests.  Colorectal Cancer  This type of cancer can be detected and often prevented.  Routine colorectal cancer screening usually begins at 35 years of age and continues through 35 years of age.  Your health care provider may recommend screening at an earlier age if you have risk factors for colon cancer.  Your health care provider may also  recommend using home test kits to check for hidden blood in the stool.  A small camera at the end of a tube can be used to examine your colon directly (sigmoidoscopy or colonoscopy). This is done to check for the earliest forms of colorectal cancer.  Routine screening usually begins at age 76.  Direct examination of the colon should be repeated every 5-10 years through 35 years of age. However, you may need to be screened more often if early forms of precancerous polyps or small growths are found.  Skin Cancer  Check your skin from head to toe regularly.  Tell your health care provider about any new moles or changes in moles, especially if there is a change in a mole's shape or color.  Also tell your health care provider if you have a mole that is larger than the size of a pencil eraser.  Always use sunscreen. Apply sunscreen liberally and repeatedly throughout the day.  Protect yourself by wearing  long sleeves, pants, a wide-brimmed hat, and sunglasses whenever you are outside.  Heart disease, diabetes, and high blood pressure  High blood pressure causes heart disease and increases the risk of stroke. High blood pressure is more likely to develop in: ? People who have blood pressure in the high end of the normal range (130-139/85-89 mm Hg). ? People who are overweight or obese. ? People who are African American.  If you are 85-44 years of age, have your blood pressure checked every 3-5 years. If you are 74 years of age or older, have your blood pressure checked every year. You should have your blood pressure measured twice-once when you are at a hospital or clinic, and once when you are not at a hospital or clinic. Record the average of the two measurements. To check your blood pressure when you are not at a hospital or clinic, you can use: ? An automated blood pressure machine at a pharmacy. ? A home blood pressure monitor.  If you are between 24 years and 41 years old, ask your  health care provider if you should take aspirin to prevent strokes.  Have regular diabetes screenings. This involves taking a blood sample to check your fasting blood sugar level. ? If you are at a normal weight and have a low risk for diabetes, have this test once every three years after 35 years of age. ? If you are overweight and have a high risk for diabetes, consider being tested at a younger age or more often. Preventing infection Hepatitis B  If you have a higher risk for hepatitis B, you should be screened for this virus. You are considered at high risk for hepatitis B if: ? You were born in a country where hepatitis B is common. Ask your health care provider which countries are considered high risk. ? Your parents were born in a high-risk country, and you have not been immunized against hepatitis B (hepatitis B vaccine). ? You have HIV or AIDS. ? You use needles to inject street drugs. ? You live with someone who has hepatitis B. ? You have had sex with someone who has hepatitis B. ? You get hemodialysis treatment. ? You take certain medicines for conditions, including cancer, organ transplantation, and autoimmune conditions.  Hepatitis C  Blood testing is recommended for: ? Everyone born from [birth year] through 1964. ? Anyone with known risk factors for hepatitis C.  Sexually transmitted infections (STIs)  You should be screened for sexually transmitted infections (STIs) including gonorrhea and chlamydia if: ? You are sexually active and are younger than 35 years of age. ? You are older than 35 years of age and your health care provider tells you that you are at risk for this type of infection. ? Your sexual activity has changed since you were last screened and you are at an increased risk for chlamydia or gonorrhea. Ask your health care provider if you are at risk.  If you do not have HIV, but are at risk, it may be recommended that you take a prescription medicine daily to  prevent HIV infection. This is called pre-exposure prophylaxis (PrEP). You are considered at risk if: ? You are sexually active and do not regularly use condoms or know the HIV status of your partner(s). ? You take drugs by injection. ? You are sexually active with a partner who has HIV.  Talk with your health care provider about whether you are at high risk of being infected with HIV.  If you choose to begin PrEP, you should first be tested for HIV. You should then be tested every 3 months for as long as you are taking PrEP. Pregnancy  If you are premenopausal and you may become pregnant, ask your health care provider about preconception counseling.  If you may become pregnant, take 400 to 800 micrograms (mcg) of folic acid every day.  If you want to prevent pregnancy, talk to your health care provider about birth control (contraception). Osteoporosis and menopause  Osteoporosis is a disease in which the bones lose minerals and strength with aging. This can result in serious bone fractures. Your risk for osteoporosis can be identified using a bone density scan.  If you are 19 years of age or older, or if you are at risk for osteoporosis and fractures, ask your health care provider if you should be screened.  Ask your health care provider whether you should take a calcium or vitamin D supplement to lower your risk for osteoporosis.  Menopause may have certain physical symptoms and risks.  Hormone replacement therapy may reduce some of these symptoms and risks. Talk to your health care provider about whether hormone replacement therapy is right for you. Follow these instructions at home:  Schedule regular health, dental, and eye exams.  Stay current with your immunizations.  Do not use any tobacco products including cigarettes, chewing tobacco, or electronic cigarettes.  If you are pregnant, do not drink alcohol.  If you are breastfeeding, limit how much and how often you drink  alcohol.  Limit alcohol intake to no more than 1 drink per day for nonpregnant women. One drink equals 12 ounces of beer, 5 ounces of wine, or 1 ounces of hard liquor.  Do not use street drugs.  Do not share needles.  Ask your health care provider for help if you need support or information about quitting drugs.  Tell your health care provider if you often feel depressed.  Tell your health care provider if you have ever been abused or do not feel safe at home. This information is not intended to replace advice given to you by your health care provider. Make sure you discuss any questions you have with your health care provider. Document Released: 07/17/2010 Document Revised: 06/09/2015 Document Reviewed: 10/05/2014 Elsevier Interactive Patient Education  [employer].

## 2016-08-15 NOTE — Assessment & Plan Note (Signed)
Encouraged follow up with Dr. Jacinto HalimGanji for weight loss. Advised her I don't prescribe phentermine but she is encouraged to continue her current efforts at weight loss.

## 2016-08-15 NOTE — Assessment & Plan Note (Signed)
Well-controlled.  Continue current regimen. 

## 2016-09-03 ENCOUNTER — Encounter (HOSPITAL_COMMUNITY): Payer: Self-pay | Admitting: Emergency Medicine

## 2016-09-03 ENCOUNTER — Emergency Department (HOSPITAL_BASED_OUTPATIENT_CLINIC_OR_DEPARTMENT_OTHER): Payer: Medicare Other

## 2016-09-03 ENCOUNTER — Emergency Department (HOSPITAL_COMMUNITY)
Admission: EM | Admit: 2016-09-03 | Discharge: 2016-09-03 | Disposition: A | Discharge status: Home / Self Care | Payer: Medicare Other | Source: Home / Self Care

## 2016-09-03 ENCOUNTER — Encounter (HOSPITAL_BASED_OUTPATIENT_CLINIC_OR_DEPARTMENT_OTHER): Payer: Self-pay

## 2016-09-03 ENCOUNTER — Emergency Department (HOSPITAL_BASED_OUTPATIENT_CLINIC_OR_DEPARTMENT_OTHER)
Admission: EM | Admit: 2016-09-03 | Discharge: 2016-09-03 | Disposition: A | Discharge status: Home / Self Care | Payer: Medicare Other | Attending: Emergency Medicine | Admitting: Emergency Medicine

## 2016-09-03 DIAGNOSIS — J45909 Unspecified asthma, uncomplicated: Secondary | ICD-10-CM | POA: Insufficient documentation

## 2016-09-03 DIAGNOSIS — Y999 Unspecified external cause status: Secondary | ICD-10-CM | POA: Insufficient documentation

## 2016-09-03 DIAGNOSIS — M545 Low back pain, unspecified: Secondary | ICD-10-CM

## 2016-09-03 DIAGNOSIS — Y92410 Unspecified street and highway as the place of occurrence of the external cause: Secondary | ICD-10-CM | POA: Insufficient documentation

## 2016-09-03 DIAGNOSIS — Y939 Activity, unspecified: Secondary | ICD-10-CM | POA: Insufficient documentation

## 2016-09-03 DIAGNOSIS — Z79899 Other long term (current) drug therapy: Secondary | ICD-10-CM | POA: Insufficient documentation

## 2016-09-03 DIAGNOSIS — Z87891 Personal history of nicotine dependence: Secondary | ICD-10-CM | POA: Insufficient documentation

## 2016-09-03 DIAGNOSIS — Y9389 Activity, other specified: Secondary | ICD-10-CM | POA: Insufficient documentation

## 2016-09-03 DIAGNOSIS — Y998 Other external cause status: Secondary | ICD-10-CM | POA: Diagnosis not present

## 2016-09-03 DIAGNOSIS — Z5321 Procedure and treatment not carried out due to patient leaving prior to being seen by health care provider: Secondary | ICD-10-CM | POA: Insufficient documentation

## 2016-09-03 DIAGNOSIS — S3992XA Unspecified injury of lower back, initial encounter: Secondary | ICD-10-CM | POA: Diagnosis not present

## 2016-09-03 LAB — PREGNANCY, URINE: Preg Test, Ur: NEGATIVE

## 2016-09-03 MED ORDER — OXYCODONE-ACETAMINOPHEN 5-325 MG PO TABS
1.0000 | ORAL_TABLET | Freq: Once | ORAL | Status: AC
  Administered 2016-09-03: 1 via ORAL
  Filled 2016-09-03: qty 1

## 2016-09-03 MED ORDER — ACETAMINOPHEN 325 MG PO TABS
650.0000 mg | ORAL_TABLET | Freq: Once | ORAL | Status: AC
  Administered 2016-09-03: 650 mg via ORAL
  Filled 2016-09-03: qty 2

## 2016-09-03 MED ORDER — METHOCARBAMOL 500 MG PO TABS: 500.0000 mg | ORAL_TABLET | Freq: Two times a day (BID) | ORAL | 0 refills | Status: DC
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
## 2016-09-10 ENCOUNTER — Encounter: Payer: Self-pay | Admitting: Family Medicine

## 2016-09-10 ENCOUNTER — Ambulatory Visit (INDEPENDENT_AMBULATORY_CARE_PROVIDER_SITE_OTHER): Payer: Medicare Other | Admitting: Family Medicine

## 2016-09-10 VITALS — BP 130/70 | HR 67 | Temp 98.3°F | Ht 66.0 in | Wt 239.4 lb

## 2016-09-10 DIAGNOSIS — M5442 Lumbago with sciatica, left side: Secondary | ICD-10-CM

## 2016-09-10 DIAGNOSIS — M545 Low back pain, unspecified: Secondary | ICD-10-CM | POA: Insufficient documentation

## 2016-09-10 DIAGNOSIS — M5441 Lumbago with sciatica, right side: Secondary | ICD-10-CM
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
## 2016-09-12 ENCOUNTER — Telehealth: Payer: Self-pay | Admitting: *Deleted

## 2016-09-12 DIAGNOSIS — M545 Low back pain: Secondary | ICD-10-CM

## 2016-09-12 NOTE — Telephone Encounter (Signed)
Patient called requesting a referral for physical therapy. Patient does not want to wait 4-6 weeks, she would like to start as soon as possible.  Phone number on file is correct.  Clovis Pu, RN

## 2016-09-12 NOTE — Telephone Encounter (Signed)
Reasonable. Referral entered. [name], MD

## 2016-09-12 NOTE — Addendum Note (Signed)
Addended by: Latrelle DodrillMCINTYRE, Kyah Buesing J on: 09/12/2016 09:23 AM   Modules accepted: Orders

## 2016-09-18 ENCOUNTER — Ambulatory Visit: Payer: Medicare Other | Attending: Family Medicine | Admitting: Physical Therapy

## 2016-09-18 DIAGNOSIS — M5440 Lumbago with sciatica, unspecified side: Secondary | ICD-10-CM | POA: Insufficient documentation

## 2016-09-18 DIAGNOSIS — R293 Abnormal posture: Secondary | ICD-10-CM | POA: Diagnosis not present

## 2016-09-18 NOTE — Patient Instructions (Signed)
Lower Trunk Rotation Stretch    Keeping back flat and feet together, rotate knees to left side. Hold ___10-15_ seconds. Repeat ___10_ times per set. Do ___1_ sets per session. Do __2__ sessions per day.  PELVIC STABILIZATION: Pelvic Tilt (Lying)    Exhaling, pull belly toward spine, tilting pelvis forward. Inhaling, release. Repeat _10__ times. Do _2-3__ times per day.  Copyright  VHI. All rights reserved.    http://orth.exer.us/123   Copyright  VHI. All rights reserved.    Abdominal Lying    Lie on abdomen, pillows as needed under hips, ankles, forehead and chest. Rest forehead on hands or on folded towel as needed and/or instructed by your therapist. Lie __1_ minutes.  Copyright  VHI. All rights reserved.     Therapeutic - Prone Press-Up    Push up so chest is off floor and back is arched. Do not raise hips. Hold __20-30__ seconds. Return to prone position. ONLY PROP UP ON ELBOWS FOR NOW Repeat __3__ times. 30 sec each  Copyright  VHI. All rights reserved.    Reducing Load   Copyright  VHI. All rights reserved.  BODY MECHANICS Tips Good body mechanics are important during activities of daily living. The practice of good body mechanics will: -help distribute weight throughout the skeleton in a more anatomically correct manner thus stimulating more normal forces on the bones, and encouraging stronger, healthier, denser bones. -reduce unnatural forces on bones, ligaments, joints and muscles and reduce risk of fracture, other injury or back pain. A WORD ON BODY POSITIONING: Sitting is the hardest position for the back. Lying on the back is the easiest. Standing, in good body alignment, is somewhere between. A good motto is: Sit less, stand more, and, when you can't do that, lie down on your back and exercise to strengthen it.  Copyright  VHI. All rights reserved.       Supine to Sit (Active)   Lie on back, left leg bent. Roll to other side. From  side-lying, sit up on side of bed. Complete ___ sets of ___ repetitions. Perform ___ sessions per day.  Copyright  VHI. All rights reserved.    Housework - Reaching Down   If you are unable to bend your knees or squat, use a lazy Darl Pikes to keep items within easy reach. Store only light, unbreakable items on the lowest shelves, and use a reacher to pick them up.  Copyright  VHI. All rights reserved.  Low Shelf   Squat down, and bring item close to lift.   Copyright  VHI. All rights reserved.  Lifting Principles .Maintain proper posture and head alignment. .Slide object as close as possible before lifting. .Move obstacles out of the way. .Test before lifting; ask for help if too heavy. .Tighten stomach muscles without holding breath. .Use smooth movements; do not jerk. .Use legs to do the work, and pivot with feet. .Distribute the work load symmetrically and close to the center of trunk. .Push instead of pull whenever possible.  Copyright  VHI. All rights reserved.  Posture - Standing   Good posture is important. Avoid slouching and forward head thrust. Maintain curve in low back and align ears over shoul- ders, hips over ankles.   Copyright  VHI. All rights reserved.   Posture - Sitting   Sit upright, head facing forward. Try using a roll to support lower back. Keep shoulders relaxed, and avoid rounded back. Keep hips level with knees. Avoid crossing legs for long periods.   Copyright  VHI. All rights reserved.  Ideal Posture Use with figures on 3 (2 of 2): 1.Head erect 2.Chin in 3.Chest and navel aligned 4.Spinal curves maintained 5.Knees relaxed 6.Shoulders and hips aligned 7.Feet slightly apart 8.Toes and arches active 9.Abdomen taut (breathe with diaphragm) 10.Arms at sides Ideal posture is: -pain free. -achieved with practice, mindful interest, and body awareness.  Copyright  VHI. All rights reserved.     Move heavy items one at a time, or move  portions of the contents.   Posture Awareness     Stand and check posture: Jut chin, pull back to comfortable position. Tilt pelvis forward, back; be sure back is not swayed. Roll from heels to balls of feet, then distribute your weight evenly. Picture a line through spine pulling you erect. Focus on breathing. Good Posture = Better Breathing. Check ____ times per day.  http://gt2.exer.us/873   Copyright  VHI. All rights reserved.
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
## 2016-09-20 ENCOUNTER — Encounter: Payer: Self-pay | Admitting: Physical Therapy

## 2016-09-20 ENCOUNTER — Ambulatory Visit: Payer: Medicare Other | Admitting: Physical Therapy

## 2016-09-20 DIAGNOSIS — R293 Abnormal posture: Secondary | ICD-10-CM

## 2016-09-20 DIAGNOSIS — M5440 Lumbago with sciatica, unspecified side: Secondary | ICD-10-CM

## 2016-09-20 NOTE — Therapy (Signed)
[facility] [address] [place], [place], [address] Phone: [phone]   Fax:  [phone]  Physical Therapy Treatment  Patient Details  Name: [name] MRN: [id number] Date of Birth: Mar 14, 1980 Referring Provider: Dr. Levert Feinstein  Encounter Date: 09/20/2016      PT End of Session - 09/20/16 1018    Visit Number 2   Number of Visits 16   Date for PT Re-Evaluation 11/13/16   PT Start Time 0935   PT Stop Time 1036   PT Time Calculation (min) 61 min   Activity Tolerance Patient tolerated treatment well;Patient limited by pain   Behavior During Therapy [facility] for tasks assessed/performed      Past Medical History:  Diagnosis Date  . Asthma     reservations to 2-3 timmes  a year that required ED visits/hospitalizations. Never intubated  . GERD (gastroesophageal reflux disease)   . Seasonal allergies     Past Surgical History:  Procedure Laterality Date  . CESAREAN SECTION    . CESAREAN SECTION N/A 10/25/2012   Procedure: CESAREAN SECTION;  Surgeon: Dorien Chihuahua. Richardson Dopp, MD;  Location: [facility];  Service: Obstetrics;  Laterality: N/A;  . CESAREAN SECTION N/A 03/05/2014   Procedure: CESAREAN SECTION;  Surgeon: Purcell Nails, MD;  Location: [facility];  Service: Obstetrics;  Laterality: N/A;    There were no vitals filed for this visit.      Subjective Assessment - 09/20/16 0937    Subjective Able to move a little easier.  i have been using the exercises.  I have been using the body mechanics techniques. They have helped with bending.     Currently in Pain? Yes   Pain Score 6   6.5   Pain Location Back   Pain Orientation Right;Left   Pain Descriptors / Indicators Aching;Throbbing;Shooting   Pain Radiating Towards toward knees   Aggravating Factors  sitting longer then getting up. Sitting on a harder chair.   Pain Relieving Factors Sitting with a pillow behind.  Does not like things to touch her back. heat, robaxin, uses  pillows at night.    Effect of Pain on Daily Activities Limits general mobility,  childcare,  sleeping.    Multiple Pain Sites No                         [facility] Adult PT Treatment/Exercise - 09/20/16 0001      Self-Care   Other Self-Care Comments  Reviewed  ADL.  Problen solved bathtime with the kids. Golfer's     Lumbar Exercises: Stretches   Lower Trunk Rotation 5 reps   Lower Trunk Rotation Limitations 10   Pelvic Tilt 10 seconds   Pelvic Tilt Limitations cued to not use legs,  painful.   Prone on Elbows Stretch 2 reps;3 reps   Prone on Elbows Stretch Limitations increased LBP eased leg pain some.     Quadruped Mid Back Stretch Limitations For HEP not practiced.  All QL exercises reviewed, not practiced.  too painful for now,  reviewed sit to stand with hip swing.    ITB Stretch Limitations left 1 X increased Pain in back     Moist Heat Therapy   Number Minutes Moist Heat 20 Minutes   Moist Heat Location Lumbar Spine     Electrical Stimulation   Electrical Stimulation Location Lumbar   Electrical Stimulation Action IFC   Electrical Stimulation Parameters 12   Electrical Stimulation Goals Pain  Manual Therapy   Manual Therapy Soft tissue mobilization   Manual therapy comments guarding   Soft tissue mobilization light pressure, unable to tolerateseveral areas attempted                PT Education - 09/20/16 1018    Education provided Yes   Education Details HEP,  Self care   Person(s) Educated Patient   Methods Explanation;Demonstration;Tactile cues;Verbal cues;Handout   Comprehension Verbalized understanding;Returned demonstration          PT Short Term Goals - 09/18/16 1156      PT SHORT TERM GOAL #1   Title Pt will be I with initial HEP for low back, trunk and hip flexibility, core.    Time 4   Period Weeks   Status New   Target Date 10/16/16     PT SHORT TERM GOAL #2   Title Pt will be able to get on and off mat table with  ease, no increase in pain.    Time 4   Period Weeks   Status New   Target Date 10/16/16     PT SHORT TERM GOAL #3   Title Pt will be able to report less difficulty with tasks related to child care due to 25% less pain overall.    Time 4   Period Weeks   Status New   Target Date 10/16/16           PT Long Term Goals - 09/18/16 1158      PT LONG TERM GOAL #1   Title Pt will be able to carry , lift child with good technique and no increase in back pain.    Time 8   Period Weeks   Status New   Target Date 11/13/16     PT LONG TERM GOAL #2   Title Pt will be able to stand for 30 min without lasting back pain (75% of the time) for childcare, community mobility.    Time 8   Period Weeks   Status New   Target Date 11/13/16     PT LONG TERM GOAL #3   Title Pt will returm to modified exercises 2-3 days per week to maximize function and fitness levels without increasing back pain.    Time 8   Period Weeks   Status New   Target Date 11/13/16     PT LONG TERM GOAL #4   Title Pt will be I with HEP as of last visit at discharge    Time 8   Period Weeks   Status New   Target Date 11/13/16     PT LONG TERM GOAL #5   Title FOTO score will improve to 35% or better to show functional improvement    Time 8   Period Weeks   Status New   Target Date 11/13/16               Plan - 09/20/16 1019    Clinical Impression Statement Mobility improving.  Pain continues.  emotional support given.  pain eased some in legs with prone.  She did not tolerate even light manual. Progress toward Body mechanics goals.     PT Treatment/Interventions ADLs/Self Care Home Management;Electrical Stimulation;Therapeutic exercise;Balance training;Cryotherapy;Ultrasound;Traction;Therapeutic activities;Patient/family education;Manual techniques;Neuromuscular re-education;Dry needling;Passive range of motion;Functional mobility training;Moist Heat   PT Next Visit Plan check HEP, modalities again and  consider standing hip extension?  stretch   PT Home Exercise Plan LTR , PPT and prone , press up.  Consulted and Agree with Plan of Care Patient      Patient will benefit from skilled therapeutic intervention in order to improve the following deficits and impairments:  Decreased activity tolerance, Decreased strength, Increased fascial restricitons, Impaired flexibility, Pain, Postural dysfunction, Obesity, Improper body mechanics, Decreased range of motion, Increased muscle spasms, Hypomobility, Difficulty walking, Decreased mobility, Decreased balance  Visit Diagnosis: Acute bilateral low back pain with sciatica, sciatica laterality unspecified  Abnormal posture     Problem List Patient Active Problem List   Diagnosis Date Noted  . Acute bilateral low back pain with bilateral sciatica 09/10/2016  . Contraception management 03/17/2015  . Large breasts 07/13/2014  . S/P repeat low transverse C-section 03/05/2014  . Coccyx pain 01/03/2014  . Right shoulder pain 07/24/2013  . Pes planus 07/10/2012  . Plantar fasciitis, bilateral 07/10/2012  . Drug abuse, marijuana 01/20/2011  . Moderate persistent allergic asthma without complication 12/28/2009  . CONSTIPATION, CHRONIC 09/10/2008  . COMMON MIGRAINE 03/20/2007  . Morbid obesity (HCC) 05/24/2006  . RHINITIS, ALLERGIC 03/14/2006  . GASTROESOPHAGEAL REFLUX, NO ESOPHAGITIS 03/14/2006    HARRIS,KAREN PTA 09/20/2016, 10:23 AM  [facility] [address] [place], [place], [address] Phone: [phone]   Fax:  [phone]  Name: Shawnna Pancake MRN: [id number] Date of Birth: Jul 03, 1980

## 2016-09-20 NOTE — Patient Instructions (Signed)
From exercise drawer: QL info, stretches. No firm instructions issued.  Patient to just try a few.

## 2016-09-25 ENCOUNTER — Ambulatory Visit: Payer: Medicare Other | Admitting: Physical Therapy

## 2016-09-25 ENCOUNTER — Encounter: Payer: Self-pay | Admitting: Physical Therapy

## 2016-09-25 DIAGNOSIS — M5440 Lumbago with sciatica, unspecified side: Secondary | ICD-10-CM | POA: Diagnosis not present

## 2016-09-25 DIAGNOSIS — R293 Abnormal posture: Secondary | ICD-10-CM | POA: Diagnosis not present

## 2016-09-25 NOTE — Therapy (Signed)
[facility] [address] [place], [place], [address] Phone: [phone]   Fax:  [phone]  Physical Therapy Treatment  Patient Details  Name: [name] MRN: [id number] Date of Birth: [birth year] Referring Provider: Dr. Levert FeinsteinBrittany Mcintyre  Encounter Date: 09/25/2016      PT End of Session - 09/25/16 1325    Visit Number 3   Number of Visits 16   Date for PT Re-Evaluation 11/13/16   PT Start Time 1103   PT Stop Time 1200   PT Time Calculation (min) 57 min   Activity Tolerance Patient tolerated treatment well   Behavior [facility] tasks assessed/performed      Past Medical History:  Diagnosis Date  . Asthma     reservations to 2-3 timmes  a year that required ED visits/hospitalizations. Never intubated  . GERD (gastroesophageal reflux disease)   . Seasonal allergies     Past Surgical History:  Procedure Laterality Date  . CESAREAN SECTION    . CESAREAN SECTION N/A 10/25/2012   Procedure: CESAREAN SECTION;  Surgeon: Dorien Chihuahuaara J. Richardson Doppole, MD;  Location: [facility];  Service: Obstetrics;  Laterality: N/A;  . CESAREAN SECTION N/A 03/05/2014   Procedure: CESAREAN SECTION;  Surgeon: Purcell NailsAngela Y Roberts, MD;  Location: [facility];  Service: Obstetrics;  Laterality: N/A;    There were no vitals filed for this visit.      Subjective Assessment - 09/25/16 1106    Subjective I am having a good day.  I exercise,  I use good posture for the kids. Walking pain in about the same.  I find myself hunched over  with doing ADL and sitting and I need to correct myself. legs and knees hurt only every once in awhile.(1-2 x a day)  I am walking 1 mile around track.   i walk in the gym ,  I have tried walking in the water,  I stretch in my legs in the water.  I have to stop ( once a lap for 10 laps) with pain in knees and back of legs.    Currently in Pain? Yes   Pain Score 5    Pain Location Back  the small of my back   Pain Descriptors /  Indicators Aching;Nagging   Pain Radiating Towards toward upper back and shoulders ,  tense.  Legs, some.    Pain Frequency Constant   Aggravating Factors  ADL's,  walking   Pain Relieving Factors Sitting with a pilow behind back,  Robaxin,  avoids things touching back, heat,  pillows at night.    Effect of Pain on Daily Activities Limits sleeping,  Extra time for chores,   Nor back to heavy lifting,  deep cleaning.    Multiple Pain Sites No                         [facility] Adult PT Treatment/Exercise - 09/25/16 0001      Self-Care   Other Self-Care Comments  TENS unit education,prices discussed,  features to look for,    How to use,  Where to not place the electrodes.   Electrode  and skin care.  Use suggestions. and more.     Lumbar Exercises: Stretches   Pelvic Tilt 10 seconds  10 x     Lumbar Exercises: Supine   Ab Set 5 reps   Clam 5 reps     Shoulder Exercises: Supine   Other Supine Exercises head  press 5 x,  head press with protraction 5 x,  head press with circles 2 ways,  pain increased  with 2nd set of circles,  (CVounter clockwise)     Shoulder Exercises: Stretch   Corner Stretch Limitations Single arm ER stretch 3 x 10 seconds each.  posture improved with this,  cues initially   Other Shoulder Stretches holding bar modified Quadratus lumborum and shoulder combo stretch 2 X 10 seconds with  Holding bar and lengthening hip longer.  Painful but feels good     Moist Heat Therapy   Number Minutes Moist Heat 20 Minutes   Moist Heat Location Lumbar Spine  and thoracic spine     Electrical Stimulation   Electrical Stimulation Location Lumbar  and thoracic spine   Electrical Stimulation Action IFC   Electrical Stimulation Parameters 12   Electrical Stimulation Goals Pain                PT Education - 09/25/16 1325    Education provided Yes   Education Details Self care   Person(s) Educated Patient   Methods Explanation   Comprehension  Verbalized understanding          PT Short Term Goals - 09/18/16 1156      PT SHORT TERM GOAL #1   Title Pt will be I with initial HEP for low back, trunk and hip flexibility, core.    Time 4   Period Weeks   Status New   Target Date 10/16/16     PT SHORT TERM GOAL #2   Title Pt will be able to get on and off mat table with ease, no increase in pain.    Time 4   Period Weeks   Status New   Target Date 10/16/16     PT SHORT TERM GOAL #3   Title Pt will be able to report less difficulty with tasks related to child care due to 25% less pain overall.    Time 4   Period Weeks   Status New   Target Date 10/16/16           PT Long Term Goals - 09/18/16 1158      PT LONG TERM GOAL #1   Title Pt will be able to carry , lift child with good technique and no increase in back pain.    Time 8   Period Weeks   Status New   Target Date 11/13/16     PT LONG TERM GOAL #2   Title Pt will be able to stand for 30 min without lasting back pain (75% of the time) for childcare, community mobility.    Time 8   Period Weeks   Status New   Target Date 11/13/16     PT LONG TERM GOAL #3   Title Pt will returm to modified exercises 2-3 days per week to maximize function and fitness levels without increasing back pain.    Time 8   Period Weeks   Status New   Target Date 11/13/16     PT LONG TERM GOAL #4   Title Pt will be I with HEP as of last visit at discharge    Time 8   Period Weeks   Status New   Target Date 11/13/16     PT LONG TERM GOAL #5   Title FOTO score will improve to 35% or better to show functional improvement    Time 8   Period Weeks   Status  New   Target Date 11/13/16               Plan - 09/25/16 1326    Clinical Impression Statement Pain increased to 6.5  after exercise, prior to modalities.  Pain slowly improving,  Pain area is getting smaller,  it shoots up back , vs going down legs (Mostly)  Her function has improved.  She has returned to the  GYM and is walking 1 mile despite having to rest each lap,  She is able to walk in the pool too.    PT Next Visit Plan Update  HEP when ready, modalities again and consider standing hip extension?  stretch   PT Home Exercise Plan LTR , PPT and prone , press up.    Consulted and Agree with Plan of Care Patient      Patient will benefit from skilled therapeutic intervention in order to improve the following deficits and impairments:  Decreased activity tolerance, Decreased strength, Increased fascial restricitons, Impaired flexibility, Pain, Postural dysfunction, Obesity, Improper body mechanics, Decreased range of motion, Increased muscle spasms, Hypomobility, Difficulty walking, Decreased mobility, Decreased balance  Visit Diagnosis: Acute bilateral low back pain with sciatica, sciatica laterality unspecified  Abnormal posture     Problem List Patient Active Problem List   Diagnosis Date Noted  . Acute bilateral low back pain with bilateral sciatica 09/10/2016  . Contraception management 03/17/2015  . Large breasts 07/13/2014  . S/P repeat low transverse C-section 03/05/2014  . Coccyx pain 01/03/2014  . Right shoulder pain 07/24/2013  . Pes planus 07/10/2012  . Plantar fasciitis, bilateral 07/10/2012  . Drug abuse, marijuana 01/20/2011  . Moderate persistent allergic asthma without complication 12/28/2009  . CONSTIPATION, CHRONIC 09/10/2008  . COMMON MIGRAINE 03/20/2007  . Morbid obesity (HCC) 05/24/2006  . RHINITIS, ALLERGIC 03/14/2006  . GASTROESOPHAGEAL REFLUX, NO ESOPHAGITIS 03/14/2006    Nikia Levels PTA 09/25/2016, 1:31 PM  [facility] [address] [place], [place], [address] Phone: [phone]   Fax:  [phone]  Name: Areyana Leoni MRN: [id number] Date of Birth: May 30, 1980

## 2016-09-27 ENCOUNTER — Ambulatory Visit: Payer: Medicare Other | Admitting: Physical Therapy

## 2016-09-27 DIAGNOSIS — M5440 Lumbago with sciatica, unspecified side: Secondary | ICD-10-CM | POA: Diagnosis not present

## 2016-09-27 DIAGNOSIS — R293 Abnormal posture: Secondary | ICD-10-CM

## 2016-09-27 NOTE — Patient Instructions (Addendum)
Trigger Point Dry Needling  . What is Trigger Point Dry Needling (DN)? o DN is a physical therapy technique used to treat muscle pain and dysfunction. Specifically, DN helps deactivate muscle trigger points (muscle knots).  o A thin filiform needle is used to penetrate the skin and stimulate the underlying trigger point. The goal is for a local twitch response (LTR) to occur and for the trigger point to relax. No medication of any kind is injected during the procedure.   . What Does Trigger Point Dry Needling Feel Like?  o The procedure feels different for each individual patient. Some patients report that they do not actually feel the needle enter the skin and overall the process is not painful. Very mild bleeding may occur. However, many patients feel a deep cramping in the muscle in which the needle was inserted. This is the local twitch response.   Marland Kitchen. How Will I feel after the treatment? o Soreness is normal, and the onset of soreness may not occur for a few hours. Typically this soreness does not last longer than two days.  o Bruising is uncommon, however; ice can be used to decrease any possible bruising.  o In rare cases feeling tired or nauseous after the treatment is normal. In addition, your symptoms may get worse before they get better, this period will typically not last longer than 24 hours.   . What Can I do After My Treatment? o Increase your hydration by drinking more water for the next 24 hours. o You may place ice or heat on the areas treated that have become sore, however, do not use heat on inflamed or bruised areas. Heat often brings more relief post needling. o You can continue your regular activities, but vigorous activity is not recommended initially after the treatment for 24 hours. o DN is best combined with other physical therapy such as strengthening, stretching, and other therapies.   Garen LahLawrie Beardsley, PT Certified Exercise Expert for the Aging Adult  09/27/16 11:16  AM Phone: [phone] Fax: [phone]

## 2016-09-27 NOTE — Therapy (Signed)
[facility] Outpatient Rehabilitation [facility] [address] [place], [place], [address] Phone: [phone]   Fax:  [phone]  Physical Therapy Treatment  Patient Details  Name: [name] MRN: [id number] Date of Birth: Feb 18, 1980 Referring Provider: Dr. Levert Feinstein  Encounter Date: 09/27/2016      PT End of Session - 09/27/16 1106    Visit Number 4   Number of Visits 16   Date for PT Re-Evaluation 11/13/16   PT Start Time 1105   PT Stop Time 1210   PT Time Calculation (min) 65 min   Activity Tolerance Patient tolerated treatment well;Patient limited by pain   Behavior During Therapy [facility] for tasks assessed/performed      Past Medical History:  Diagnosis Date  . Asthma     reservations to 2-3 timmes  a year that required ED visits/hospitalizations. Never intubated  . GERD (gastroesophageal reflux disease)   . Seasonal allergies     Past Surgical History:  Procedure Laterality Date  . CESAREAN SECTION    . CESAREAN SECTION N/A 10/25/2012   Procedure: CESAREAN SECTION;  Surgeon: Dorien Chihuahua. Richardson Dopp, MD;  Location: [facility];  Service: Obstetrics;  Laterality: N/A;  . CESAREAN SECTION N/A 03/05/2014   Procedure: CESAREAN SECTION;  Surgeon: Purcell Nails, MD;  Location: [facility];  Service: Obstetrics;  Laterality: N/A;    There were no vitals filed for this visit.      Subjective Assessment - 09/27/16 1109    Subjective The exercises help some. I have two toddlers and it is hard to maneuver with housework. I am in so much pain that i do not know what to do   Limitations Sitting;Lifting;Standing;Walking;House hold activities   How long can you sit comfortably? 5-10 minutes and then I must fidget   How long can you stand comfortably? 30 minutes   Diagnostic tests XR done in ED   Patient Stated Goals To get back to me my regular self, was exercising.     Currently in Pain? Yes   Pain Score 5    Pain Location Back   Pain Orientation Right;Left   Pain  Descriptors / Indicators Aching   Pain Type Acute pain   Pain Onset More than a month ago   Pain Frequency Constant                         [facility] Adult PT Treatment/Exercise - 09/27/16 1116      Posture/Postural Control   Posture Comments elevated Quadratus lumborum with anterior rotation of right pelvis.      Self-Care   Self-Care Other Self-Care Comments   Other Self-Care Comments  education on TDN after care and precautians, emphasis on proper sitting and standing posture     Lumbar Exercises: Stretches   Lower Trunk Rotation 5 reps   Lower Trunk Rotation Limitations x 10 sec hold   Pelvic Tilt 10 seconds   Pelvic Tilt Limitations x 10    Quadruped Mid Back Stretch Limitations forward x 2 side to side x 2 right and left   Prone Mid Back Stretch 30 seconds     Lumbar Exercises: Supine   Ab Set 5 reps   Clam 5 reps   Bridge 5 reps  pt performs slowly     Moist Heat Therapy   Number Minutes Moist Heat 20 Minutes   Moist Heat Location Lumbar Spine  and thoracic spine     Electrical Stimulation   Electrical  Stimulation Location Lumbar  sidelying on left   Electrical Stimulation Action IFC   Electrical Stimulation Parameters 15   Electrical Stimulation Goals Pain     Ultrasound   Ultrasound Location right Quadratus lumborum   Ultrasound Parameters 1.6 w/cm2 100% for 10 minutes and over right lumbosacral   Ultrasound Goals Pain     Manual Therapy   Manual Therapy Soft tissue mobilization;Myofascial release   Joint Mobilization right sacral gapping in left sidelying with muscle energy   Soft tissue mobilization sacral light scrubbing   Myofascial Release left sidelying           Trigger Point Dry Needling - 09/27/16 1113    Consent Given? Yes   Education Handout Provided Yes   Muscles Treated Upper Body Quadratus Lumborum;Longissimus   Muscles Treated Lower Body Gluteus minimus;Gluteus maximus   Longissimus Response Twitch response  elicited;Palpable increased muscle length  sacral lower lumbar.   Gluteus Maximus Response Twitch response elicited;Palpable increased muscle length   Gluteus Minimus Response Twitch response elicited;Palpable increased muscle length                PT Short Term Goals - 09/27/16 1222      PT SHORT TERM GOAL #1   Title Pt will be I with initial HEP for low back, trunk and hip flexibility, core.    Baseline tolerating initial exercise better but needs to add when pain better controlled   Time 4   Period Weeks   Status On-going     PT SHORT TERM GOAL #2   Title Pt will be able to get on and off mat table with ease, no increase in pain.    Baseline emphasized proper body mechanics for transitional movements this session   Time 4   Period Weeks   Status On-going     PT SHORT TERM GOAL #3   Title Pt will be able to report less difficulty with tasks related to child care due to 25% less pain overall.    Baseline Pain still limiting   Time 4   Period Weeks   Status On-going           PT Long Term Goals - 09/18/16 1158      PT LONG TERM GOAL #1   Title Pt will be able to carry , lift child with good technique and no increase in back pain.    Time 8   Period Weeks   Status New   Target Date 11/13/16     PT LONG TERM GOAL #2   Title Pt will be able to stand for 30 min without lasting back pain (75% of the time) for childcare, community mobility.    Time 8   Period Weeks   Status New   Target Date 11/13/16     PT LONG TERM GOAL #3   Title Pt will returm to modified exercises 2-3 days per week to maximize function and fitness levels without increasing back pain.    Time 8   Period Weeks   Status New   Target Date 11/13/16     PT LONG TERM GOAL #4   Title Pt will be I with HEP as of last visit at discharge    Time 8   Period Weeks   Status New   Target Date 11/13/16     PT LONG TERM GOAL #5   Title FOTO score will improve to 35% or better to show functional  improvement  Time 8   Period Weeks   Status New   Target Date 11/13/16               Plan - 09/27/16 1223    Clinical Impression Statement Pt educated on trigger point dry needling and consented to it after explanation.  Pt was followed closely and then ended with exercises. Pt with better and more fluid movement but still with pain limiting motion.  Pt with extremely tight right sided quadratus lumborum causing sheering force around lumbosacral area. will continue woriking on exercises to alleviate Whipl[employer] symptoms   Rehab Potential Excellent   PT Frequency 2x / week   PT Duration 8 weeks   PT Treatment/Interventions ADLs/Self Care Home Management;Electrical Stimulation;Therapeutic exercise;Balance training;Cryotherapy;Ultrasound;Traction;Therapeutic activities;Patient/family education;Manual techniques;Neuromuscular re-education;Dry needling;Passive range of motion;Functional mobility training;Moist Heat   PT Next Visit Plan Update  HEP when ready, modalities again and consider standing hip extension?  Assess TDN   PT Home Exercise Plan LTR , PPT and prone , press up.       Patient will benefit from skilled therapeutic intervention in order to improve the following deficits and impairments:  Decreased activity tolerance, Decreased strength, Increased fascial restricitons, Impaired flexibility, Pain, Postural dysfunction, Obesity, Improper body mechanics, Decreased range of motion, Increased muscle spasms, Hypomobility, Difficulty walking, Decreased mobility, Decreased balance  Visit Diagnosis: Acute bilateral low back pain with sciatica, sciatica laterality unspecified  Abnormal posture     Problem List Patient Active Problem List   Diagnosis Date Noted  . Acute bilateral low back pain with bilateral sciatica 09/10/2016  . Contraception management 03/17/2015  . Large breasts 07/13/2014  . S/P repeat low transverse C-section 03/05/2014  . Coccyx  pain 01/03/2014  . Right shoulder pain 07/24/2013  . Pes planus 07/10/2012  . Plantar fasciitis, bilateral 07/10/2012  . Drug abuse, marijuana 01/20/2011  . Moderate persistent allergic asthma without complication 12/28/2009  . CONSTIPATION, CHRONIC 09/10/2008  . COMMON MIGRAINE 03/20/2007  . Morbid obesity (HCC) 05/24/2006  . RHINITIS, ALLERGIC 03/14/2006  . GASTROESOPHAGEAL REFLUX, NO ESOPHAGITIS 03/14/2006    Garen LahLawrie Beardsley, PT Certified Exercise Expert for the Aging Adult  09/27/16 12:27 PM Phone: [phone] Fax: [phone]  [facility] [address] [place], [place], [address] Phone: [phone]   Fax:  [phone]  Name: [name] MRN: [id number] Date of Birth: [birth year]

## 2016-10-01 ENCOUNTER — Ambulatory Visit: Payer: Medicare Other | Admitting: Physical Therapy

## 2016-10-01 ENCOUNTER — Encounter: Payer: Self-pay | Admitting: Physical Therapy

## 2016-10-01 DIAGNOSIS — M5440 Lumbago with sciatica, unspecified side: Secondary | ICD-10-CM

## 2016-10-01 DIAGNOSIS — R293 Abnormal posture: Secondary | ICD-10-CM | POA: Diagnosis not present

## 2016-10-01 NOTE — Therapy (Signed)
[facility] Outpatient [facility] [address] [place], [place], [address] Phone: [phone]   Fax:  [phone]  Physical Therapy Treatment  Patient Details  Name: [name] MRN: [id number] Date of Birth: October 14, 1980 Referring Provider: Dr. Levert Feinstein  Encounter Date: 10/01/2016      PT End of Session - 10/01/16 1028    Visit Number 5   Number of Visits 16   Date for PT Re-Evaluation 11/13/16   PT Start Time 0933   PT Stop Time 1035   PT Time Calculation (min) 62 min   Behavior During Therapy [facility] for tasks assessed/performed      Past Medical History:  Diagnosis Date  . Asthma     reservations to 2-3 timmes  a year that required ED visits/hospitalizations. Never intubated  . GERD (gastroesophageal reflux disease)   . Seasonal allergies     Past Surgical History:  Procedure Laterality Date  . CESAREAN SECTION    . CESAREAN SECTION N/A 10/25/2012   Procedure: CESAREAN SECTION;  Surgeon: Dorien Chihuahua. Richardson Dopp, MD;  Location: [facility];  Service: Obstetrics;  Laterality: N/A;  . CESAREAN SECTION N/A 03/05/2014   Procedure: CESAREAN SECTION;  Surgeon: Purcell Nails, MD;  Location: [facility];  Service: Obstetrics;  Laterality: N/A;    There were no vitals filed for this visit.      Subjective Assessment - 10/01/16 0942    Subjective The dry needle helped a lot.  i am feeling like myself this weekend,   Pain is a lot better                         [facility] Adult PT Treatment/Exercise - 10/01/16 0001      Lumbar Exercises: Stretches   Single Knee to Chest Stretch 1 rep;30 seconds  right.  to decrease spasm caused by SLR   Lower Trunk Rotation 5 reps   Lower Trunk Rotation Limitations x 10 sec hold   Pelvic Tilt 5 reps   Pelvic Tilt Limitations helped to decrease spasms   Quadruped Mid Back Stretch Limitations forward x 2 side to side x 2 right and left   Prone Mid Back Stretch 10 seconds     Lumbar Exercises: Supine   Bridge 5 reps   Bridge Limitations small movements,  slow   Straight Leg Raises Limitations AA X 7 reps.  right for muscle energy, however spasm increased.       Moist Heat Therapy   Number Minutes Moist Heat 20 Minutes   Moist Heat Location Lumbar Spine  while stretching QL, concurrebt with IFC     Electrical Stimulation   Electrical Stimulation Location Lumbar   Electrical Stimulation Action IFC   Electrical Stimulation Parameters 12   Electrical Stimulation Goals Pain     Ultrasound   Ultrasound Location right quadratus lumborum,  paraspinals,   Ultrasound Parameters 1.6 watts/cm2 10 minutes  100%   Ultrasound Goals Pain     Manual Therapy   Soft tissue mobilization light,  brief to QL area                  PT Short Term Goals - 09/27/16 1222      PT SHORT TERM GOAL #1   Title Pt will be I with initial HEP for low back, trunk and hip flexibility, core.    Baseline tolerating initial exercise better but needs to add when pain better controlled   Time 4  Period Weeks   Status On-going     PT SHORT TERM GOAL #2   Title Pt will be able to get on and off mat table with ease, no increase in pain.    Baseline emphasized proper body mechanics for transitional movements this session   Time 4   Period Weeks   Status On-going     PT SHORT TERM GOAL #3   Title Pt will be able to report less difficulty with tasks related to child care due to 25% less pain overall.    Baseline Pain still limiting   Time 4   Period Weeks   Status On-going           PT Long Term Goals - 09/18/16 1158      PT LONG TERM GOAL #1   Title Pt will be able to carry , lift child with good technique and no increase in back pain.    Time 8   Period Weeks   Status New   Target Date 11/13/16     PT LONG TERM GOAL #2   Title Pt will be able to stand for 30 min without lasting back pain (75% of the time) for childcare, community mobility.    Time 8   Period Weeks   Status New    Target Date 11/13/16     PT LONG TERM GOAL #3   Title Pt will returm to modified exercises 2-3 days per week to maximize function and fitness levels without increasing back pain.    Time 8   Period Weeks   Status New   Target Date 11/13/16     PT LONG TERM GOAL #4   Title Pt will be I with HEP as of last visit at discharge    Time 8   Period Weeks   Status New   Target Date 11/13/16     PT LONG TERM GOAL #5   Title FOTO score will improve to 35% or better to show functional improvement    Time 8   Period Weeks   Status New   Target Date 11/13/16               Plan - 10/01/16 1028    Clinical Impression Statement DN helpful.  Pain 3/10 today.  Child care and ease of getting on , off mat improves.  Spasm increased during exercises.  (AA SLR rtght to decrease rotation)  Stretching and modalities helpful.    PT Next Visit Plan Update  HEP when ready, modalities again and consider standing hip extension?  Assess TDN   PT Home Exercise Plan LTR , PPT and prone , press up.    Consulted and Agree with Plan of Care Patient      Patient will benefit from skilled therapeutic intervention in order to improve the following deficits and impairments:     Visit Diagnosis: Acute bilateral low back pain with sciatica, sciatica laterality unspecified  Abnormal posture     Problem List Patient Active Problem List   Diagnosis Date Noted  . Acute bilateral low back pain with bilateral sciatica 09/10/2016  . Contraception management 03/17/2015  . Large breasts 07/13/2014  . S/P repeat low transverse C-section 03/05/2014  . Coccyx pain 01/03/2014  . Right shoulder pain 07/24/2013  . Pes planus 07/10/2012  . Plantar fasciitis, bilateral 07/10/2012  . Drug abuse, marijuana 01/20/2011  . Moderate persistent allergic asthma without complication 12/28/2009  . CONSTIPATION, CHRONIC 09/10/2008  . COMMON MIGRAINE 03/20/2007  .  Morbid obesity (HCC) 05/24/2006  . RHINITIS, ALLERGIC  03/14/2006  . GASTROESOPHAGEAL REFLUX, NO ESOPHAGITIS 03/14/2006    Jeriel Vivanco PTA 10/01/2016, 10:32 AM  [facility] [address] [place], [place], [address] Phone: [phone]   Fax:  [phone]  Name: Dezzie Badilla MRN: [id number] Date of Birth: [birth year]

## 2016-10-08 ENCOUNTER — Ambulatory Visit: Payer: Medicare Other | Admitting: Physical Therapy

## 2016-10-08 DIAGNOSIS — M5440 Lumbago with sciatica, unspecified side: Secondary | ICD-10-CM

## 2016-10-08 DIAGNOSIS — R293 Abnormal posture: Secondary | ICD-10-CM

## 2016-10-08 NOTE — Therapy (Signed)
[place], [place], [address] Phone: [phone]   Fax:  [phone]  Physical Therapy Treatment  Patient Details  Name: [name] MRN: [id number] Date of Birth: 1980-08-24 Referring Provider: Dr. Chrisandra Netters  Encounter Date: 10/08/2016      PT End of Session - 10/08/16 0946    Visit Number 6   Number of Visits 16   Date for PT Re-Evaluation 11/13/16   PT Start Time 0936   PT Stop Time 1025   PT Time Calculation (min) 49 min   Activity Tolerance Patient tolerated treatment well   Behavior During Therapy [facility] for tasks assessed/performed      Past Medical History:  Diagnosis Date  . Asthma     reservations to 2-3 timmes  a year that required ED visits/hospitalizations. Never intubated  . GERD (gastroesophageal reflux disease)   . Seasonal allergies     Past Surgical History:  Procedure Laterality Date  . CESAREAN SECTION    . CESAREAN SECTION N/A 10/25/2012   Procedure: CESAREAN SECTION;  Surgeon: Maeola Sarah. Landry Mellow, MD;  Location: [place] [facility];  Service: Obstetrics;  Laterality: N/A;  . CESAREAN SECTION N/A 03/05/2014   Procedure: CESAREAN SECTION;  Surgeon: Delice Lesch, MD;  Location: [place] [facility];  Service: Obstetrics;  Laterality: N/A;    There were no vitals filed for this visit.      Subjective Assessment - 10/08/16 1328    Subjective Doing great, pain much better overall.  No more leg pain.  Does HEP.    Currently in Pain? Yes   Pain Score 2    Pain Location Back   Pain Orientation Right   Pain Descriptors / Indicators Aching   Pain Type Acute pain   Pain Onset More than a month ago   Pain Frequency Intermittent   Aggravating Factors  activity  and lifting    Pain Relieving Factors meds, heat, dry needling           [facility] Adult PT Treatment/Exercise - 10/08/16 0001      Lumbar Exercises: Stretches   Lower Trunk Rotation 5 reps   Lower Trunk Rotation Limitations x 10 sec hold    Pelvic Tilt 10 seconds   Pelvic Tilt Limitations x 10    Prone on Elbows Stretch 10 seconds   Prone on Elbows Stretch Limitations x 10 reps     Lumbar Exercises: Supine   Ab Set 10 reps   Clam 10 reps   Bent Knee Raise 10 reps   Bridge --     Moist Heat Therapy   Number Minutes Moist Heat 15 Minutes   Moist Heat Location Lumbar Spine  while stretching QL, concurrebt with IFC     Electrical Stimulation   Electrical Stimulation Location Lumbar   Electrical Stimulation Action IFC   Electrical Stimulation Parameters 14   Electrical Stimulation Goals Pain     Manual Therapy   Joint Mobilization right sacral gapping in left sidelying   Soft tissue mobilization R.t QL and lumbar paraspinals Rt>Lt in sidelying and prone    Myofascial Release R trunk                 PT Education - 10/08/16 1016    Education provided Yes   Education Details core and lumbar stabilization    Person(s) Educated Patient   Methods Explanation;Handout   Comprehension Verbalized understanding;Returned demonstration;Need further instruction          PT Short  Term Goals - 10/08/16 0955      PT SHORT TERM GOAL #1   Title Pt will be I with initial HEP for low back, trunk and hip flexibility, core.    Baseline met for flexibility, given core today    Status Partially Met     PT SHORT TERM GOAL #2   Title Pt will be able to get on and off mat table with ease, no increase in pain.    Status Achieved     PT SHORT TERM GOAL #3   Title Pt will be able to report less difficulty with tasks related to child care due to 25% less pain overall.    Status Achieved           PT Long Term Goals - 10/08/16 0956      PT LONG TERM GOAL #1   Title Pt will be able to carry , lift child with good technique and no increase in back pain.    Status Unable to assess     PT LONG TERM GOAL #2   Title Pt will be able to stand for 30 min without lasting back pain (75% of the time) for childcare, community  mobility.    Status Achieved     PT LONG TERM GOAL #3   Title Pt will returm to modified exercises 2-3 days per week to maximize function and fitness levels without increasing back pain.    Baseline hasbeen able to walk for fitness a few times    Status Partially Met     PT LONG TERM GOAL #4   Title Pt will be I with HEP as of last visit at discharge    Status On-going     PT LONG TERM GOAL #5   Title FOTO score will improve to 35% or better to show functional improvement    Status Unable to assess               Plan - 10/08/16 1330    Clinical Impression Statement Able to get up from mat without pain increasing.  Muscle spasms are under control, as is radicular pain. Cont to see improvement and focus more on core in the next weeks to ensure gains are maintain.    PT Next Visit Plan Try lifting/child care problem solving for difficult issues related to back pain.  modalities again and consider standing hip extension?  Repeat TDN   PT Home Exercise Plan LTR , PPT and prone , press up. , QL stretch.  Lumbar stab 1   Consulted and Agree with Plan of Care Patient      Patient will benefit from skilled therapeutic intervention in order to improve the following deficits and impairments:     Visit Diagnosis: Acute bilateral low back pain with sciatica, sciatica laterality unspecified  Abnormal posture     Problem List Patient Active Problem List   Diagnosis Date Noted  . Acute bilateral low back pain with bilateral sciatica 09/10/2016  . Contraception management 03/17/2015  . Large breasts 07/13/2014  . S/P repeat low transverse C-section 03/05/2014  . Coccyx pain 01/03/2014  . Right shoulder pain 07/24/2013  . Pes planus 07/10/2012  . Plantar fasciitis, bilateral 07/10/2012  . Drug abuse, marijuana 01/20/2011  . Moderate persistent allergic asthma without complication 01/75/1025  . CONSTIPATION, CHRONIC 09/10/2008  . COMMON MIGRAINE 03/20/2007  . Morbid obesity  ([place]) 05/24/2006  . RHINITIS, ALLERGIC 03/14/2006  . GASTROESOPHAGEAL REFLUX, NO ESOPHAGITIS 03/14/2006  Deklan Minar 10/08/2016, 1:32 PM  [facility] [address] [place], [place], [address] Phone: [phone]   Fax:  [phone]  Name: [name] MRN: [id number] Date of Birth: 1980-02-12  Raeford Razor, PT 10/08/16 1:33 PM Phone: [phone] Fax: [phone]

## 2016-10-08 NOTE — Patient Instructions (Signed)
Lumbar stab from drawer issued for beginer core 2 times daily, 10 reps PPT Clam  SLR (vs heelslide) Marching

## 2016-10-12 ENCOUNTER — Encounter: Payer: Self-pay | Admitting: Physical Therapy

## 2016-10-12 ENCOUNTER — Ambulatory Visit: Payer: Medicare Other | Admitting: Physical Therapy

## 2016-10-12 DIAGNOSIS — M5440 Lumbago with sciatica, unspecified side: Secondary | ICD-10-CM | POA: Diagnosis not present

## 2016-10-12 DIAGNOSIS — R293 Abnormal posture: Secondary | ICD-10-CM | POA: Diagnosis not present

## 2016-10-12 NOTE — Therapy (Signed)
[place], [place], [address] Phone: [phone]   Fax:  [phone]  Physical Therapy Treatment  Patient Details  Name: [name] MRN: [id number] Date of Birth: [birth year] Referring Provider: Dr. Chrisandra Netters  Encounter Date: 10/12/2016      PT End of Session - 10/12/16 1112    Visit Number 7   Number of Visits 16   Date for PT Re-Evaluation 11/13/16   PT Start Time 0938   PT Stop Time 1016   PT Time Calculation (min) 38 min   Activity Tolerance Patient tolerated treatment well   Behavior During Therapy [facility] for tasks assessed/performed      Past Medical History:  Diagnosis Date  . Asthma     reservations to 2-3 timmes  a year that required ED visits/hospitalizations. Never intubated  . GERD (gastroesophageal reflux disease)   . Seasonal allergies     Past Surgical History:  Procedure Laterality Date  . CESAREAN SECTION    . CESAREAN SECTION N/A 10/25/2012   Procedure: CESAREAN SECTION;  Surgeon: Maeola Sarah. Landry Mellow, MD;  Location: [facility];  Service: Obstetrics;  Laterality: N/A;  . CESAREAN SECTION N/A 03/05/2014   Procedure: CESAREAN SECTION;  Surgeon: Delice Lesch, MD;  Location: [facility];  Service: Obstetrics;  Laterality: N/A;    There were no vitals filed for this visit.      Subjective Assessment - 10/12/16 0942    Subjective No pain still.  No pain with ADLs, childcare and mobility.  Goes to the gym 2-3 times and walks, does water aerobics   How long can you sit comfortably? can sit as she needs to    How long can you stand comfortably? as long as she needs to    How long can you walk comfortably? not limited by pain              [facility] Adult PT Treatment/Exercise - 10/12/16 0001      Self-Care   Other Self-Care Comments  gym exercises and protection for low back , neutral spine      Lumbar Exercises: Stretches   Lower Trunk Rotation Limitations 10 x 10 sec    Piriformis Stretch 2 reps;30 seconds   Piriformis Stretch Limitations seated      Lumbar Exercises: Machines for Strengthening   Leg Press 1 plate 2 sets x 20 neutral and wide stance    Other Lumbar Machine Exercise Lat pull down 25 lbs x 15, high row x 25 lbs x 15    Other Lumbar Machine Exercise seated row 35lbs x 15      Lumbar Exercises: Supine   Clam 10 reps   Clam Limitations 2 sets, bilat. and unilat feet on the ball to destabilize    Bent Knee Raise 10 reps   Bent Knee Raise Limitations feet on ball (small)    Bridge 10 reps;Other (comment)   Bridge Limitations ball squeeze   2 sets , 2nd set with arm bands horizontal pull      Knee/Hip Exercises: Machines for Strengthening   Cybex Knee Extension 25 lbs 2x 10    Cybex Knee Flexion 35 lbs 2 x 10      Shoulder Exercises: Supine   Horizontal ABduction Strengthening;Both;10 reps   Theraband Level (Shoulder Horizontal ABduction) Level 2 (Red)                PT Education - 10/12/16 1112    Education provided Yes  Education Details gym exercises    Person(s) Educated Patient   Methods Explanation;Demonstration   Comprehension Verbalized understanding          PT Short Term Goals - 10/08/16 0955      PT SHORT TERM GOAL #1   Title Pt will be I with initial HEP for low back, trunk and hip flexibility, core.    Baseline met for flexibility, given core today    Status Partially Met     PT SHORT TERM GOAL #2   Title Pt will be able to get on and off mat table with ease, no increase in pain.    Status Achieved     PT SHORT TERM GOAL #3   Title Pt will be able to report less difficulty with tasks related to child care due to 25% less pain overall.    Status Achieved           PT Long Term Goals - 10/08/16 0956      PT LONG TERM GOAL #1   Title Pt will be able to carry , lift child with good technique and no increase in back pain.    Status Unable to assess     PT LONG TERM GOAL #2   Title Pt will be  able to stand for 30 min without lasting back pain (75% of the time) for childcare, community mobility.    Status Achieved     PT LONG TERM GOAL #3   Title Pt will returm to modified exercises 2-3 days per week to maximize function and fitness levels without increasing back pain.    Baseline hasbeen able to walk for fitness a few times    Status Partially Met     PT LONG TERM GOAL #4   Title Pt will be I with HEP as of last visit at discharge    Status On-going     PT LONG TERM GOAL #5   Title FOTO score will improve to 35% or better to show functional improvement    Status Unable to assess               Plan - 10/12/16 1113    Clinical Impression Statement Patient able to demo gym machine exercises without increasing back pain. She needed only min cues. She did report pain in back only with using the ab machine at the gym.  Provided education for safety.      PT Next Visit Plan Try lifting/child care problem solving for difficult issues related to back pain.  modalities again and consider standing hip extension?  Repeat TDN   PT Home Exercise Plan LTR , PPT and prone , press up. , QL stretch.  Lumbar stab 1   Consulted and Agree with Plan of Care Patient      Patient will benefit from skilled therapeutic intervention in order to improve the following deficits and impairments:  Decreased activity tolerance, Decreased strength, Increased fascial restricitons, Impaired flexibility, Pain, Postural dysfunction, Obesity, Improper body mechanics, Decreased range of motion, Increased muscle spasms, Hypomobility, Difficulty walking, Decreased mobility, Decreased balance  Visit Diagnosis: Acute bilateral low back pain with sciatica, sciatica laterality unspecified  Abnormal posture     Problem List Patient Active Problem List   Diagnosis Date Noted  . Acute bilateral low back pain with bilateral sciatica 09/10/2016  . Contraception management 03/17/2015  . Large breasts  07/13/2014  . S/P repeat low transverse C-section 03/05/2014  . Coccyx pain 01/03/2014  . Right  shoulder pain 07/24/2013  . Pes planus 07/10/2012  . Plantar fasciitis, bilateral 07/10/2012  . Drug abuse, marijuana 01/20/2011  . Moderate persistent allergic asthma without complication 82/95/6213  . CONSTIPATION, CHRONIC 09/10/2008  . COMMON MIGRAINE 03/20/2007  . Morbid obesity ([place]) 05/24/2006  . RHINITIS, ALLERGIC 03/14/2006  . GASTROESOPHAGEAL REFLUX, NO ESOPHAGITIS 03/14/2006    , 10/12/2016, 11:24 AM  [facility] [address] [place], [place], [address] Phone: [phone]   Fax:  [phone]  Name: [name] MRN: [id number] Date of Birth: 1980-04-26   Raeford Razor, PT 10/12/16 11:24 AM Phone: [phone] Fax: [phone]

## 2016-10-16 ENCOUNTER — Ambulatory Visit: Payer: Medicare Other | Attending: Family Medicine | Admitting: Physical Therapy

## 2016-10-16 DIAGNOSIS — R293 Abnormal posture: Secondary | ICD-10-CM | POA: Insufficient documentation

## 2016-10-16 DIAGNOSIS — M5440 Lumbago with sciatica, unspecified side: Secondary | ICD-10-CM | POA: Diagnosis not present

## 2016-10-16 NOTE — Therapy (Signed)
[place], [place], [address] Phone: [phone]   Fax:  [phone]  Physical Therapy Treatment  Patient Details  Name: [name] MRN: [id number] Date of Birth: 31-May-1980 Referring Provider: Dr. Chrisandra Netters  Encounter Date: 10/16/2016      PT End of Session - 10/16/16 0935    Visit Number 8   Number of Visits 16   Date for PT Re-Evaluation 11/13/16   PT Start Time 0935   PT Stop Time 1025   PT Time Calculation (min) 50 min   Activity Tolerance Patient tolerated treatment well   Behavior During Therapy [facility] for tasks assessed/performed      Past Medical History:  Diagnosis Date  . Asthma     reservations to 2-3 timmes  a year that required ED visits/hospitalizations. Never intubated  . GERD (gastroesophageal reflux disease)   . Seasonal allergies     Past Surgical History:  Procedure Laterality Date  . CESAREAN SECTION    . CESAREAN SECTION N/A 10/25/2012   Procedure: CESAREAN SECTION;  Surgeon: Maeola Sarah. Landry Mellow, MD;  Location: [facility];  Service: Obstetrics;  Laterality: N/A;  . CESAREAN SECTION N/A 03/05/2014   Procedure: CESAREAN SECTION;  Surgeon: Delice Lesch, MD;  Location: [facility];  Service: Obstetrics;  Laterality: N/A;    There were no vitals filed for this visit.      Subjective Assessment - 10/16/16 0939    Subjective No pain still,     Limitations Sitting;Lifting;Standing;Walking;House hold activities   Patient Stated Goals To get back to me my regular self, was exercising.     Currently in Pain? No/denies                         [facility] Adult PT Treatment/Exercise - 10/16/16 0940      Self-Care   Self-Care Other Self-Care Comments   Other Self-Care Comments  education on proper form for  [employer]     Lumbar Exercises: Stretches   Lower Trunk Rotation 5 reps   Lower Trunk Rotation Limitations 10 sec   Prone on Elbows Stretch 10 seconds   Prone on Elbows Stretch Limitations x 10 reps   Piriformis Stretch 2 reps;30 seconds   Piriformis Stretch Limitations supine     Lumbar Exercises: Aerobic   Elliptical 6 minutes level 1     Lumbar Exercises: Machines for Strengthening   Leg Press --   Other Lumbar Machine Exercise Lat pull down 25 lbs x 15, high row x 25 lbs x 15    Other Lumbar Machine Exercise seated row 35lbs x 10 x 2    pectoral bil strength on machine  25# 2 x 10     Knee/Hip Exercises: Machines for Strengthening   Cybex Knee Extension 35 lbs 2x 10    Cybex Knee Flexion 45 lbs 2 x 10      Moist Heat Therapy   Number Minutes Moist Heat 15 Minutes   Moist Heat Location Lumbar Spine  while stretching QL, concurrebt with IFC     Manual Therapy   Manual Therapy Myofascial release   Soft tissue mobilization Right QL myofascial relaeae in left sidelying                PT Education - 10/16/16 1130    Education provided Yes   Education Details reinforcement of gym exercises,    Person(s) Educated Patient   Methods Explanation;Demonstration  Comprehension Verbalized understanding;Returned demonstration          PT Short Term Goals - 10/08/16 0955      PT SHORT TERM GOAL #1   Title Pt will be I with initial HEP for low back, trunk and hip flexibility, core.    Baseline met for flexibility, given core today    Status Partially Met     PT SHORT TERM GOAL #2   Title Pt will be able to get on and off mat table with ease, no increase in pain.    Status Achieved     PT SHORT TERM GOAL #3   Title Pt will be able to report less difficulty with tasks related to child care due to 25% less pain overall.    Status Achieved           PT Long Term Goals - 10/08/16 0956      PT LONG TERM GOAL #1   Title Pt will be able to carry , lift child with good technique and no increase in back pain.    Status Unable to assess     PT LONG TERM GOAL #2   Title Pt will be able to stand for 30 min without  lasting back pain (75% of the time) for childcare, community mobility.    Status Achieved     PT LONG TERM GOAL #3   Title Pt will returm to modified exercises 2-3 days per week to maximize function and fitness levels without increasing back pain.    Baseline hasbeen able to walk for fitness a few times    Status Partially Met     PT LONG TERM GOAL #4   Title Pt will be I with HEP as of last visit at discharge    Status On-going     PT LONG TERM GOAL #5   Title FOTO score will improve to 35% or better to show functional improvement    Status Unable to assess               Plan - 10/16/16 1131    Clinical Impression Statement Pt is feeling no pain and is active at [employer].  She was able to do exercises on gym equipment today without exacerbation of pain.  Pt declined TDN due to feeling so much better Pt shoulde be ready for DC in next 1 to 2 visits. Continue with core strength in preparation for transition to gym alone for Exercises   Rehab Potential Excellent   PT Frequency 2x / week   PT Duration 8 weeks   PT Treatment/Interventions ADLs/Self Care Home Management;Electrical Stimulation;Therapeutic exercise;Balance training;Cryotherapy;Ultrasound;Traction;Therapeutic activities;Patient/family education;Manual techniques;Neuromuscular re-education;Dry needling;Passive range of motion;Functional mobility training;Moist Heat   PT Next Visit Plan Try lifting/child care problem solving for difficult issues related to back pain.  modalities again and consider standing hip extension? Will be ready for DC in 1-2 visits.  Do FOTO next visit  Repeat TDN if necessary   PT Home Exercise Plan LTR , PPT and prone , press up. , QL stretch.  Lumbar stab 1, gym equipment   Consulted and Agree with Plan of Care Patient      Patient will benefit from skilled therapeutic intervention in order to improve the following deficits and impairments:  Decreased activity tolerance, Decreased  strength, Increased fascial restricitons, Impaired flexibility, Pain, Postural dysfunction, Obesity, Improper body mechanics, Decreased range of motion, Increased muscle spasms, Hypomobility, Difficulty walking, Decreased mobility, Decreased balance  Visit Diagnosis:  Acute bilateral low back pain with sciatica, sciatica laterality unspecified  Abnormal posture     Problem List Patient Active Problem List   Diagnosis Date Noted  . Acute bilateral low back pain with bilateral sciatica 09/10/2016  . Contraception management 03/17/2015  . Large breasts 07/13/2014  . S/P repeat low transverse C-section 03/05/2014  . Coccyx pain 01/03/2014  . Right shoulder pain 07/24/2013  . Pes planus 07/10/2012  . Plantar fasciitis, bilateral 07/10/2012  . Drug abuse, marijuana 01/20/2011  . Moderate persistent allergic asthma without complication 35/24/8185  . CONSTIPATION, CHRONIC 09/10/2008  . COMMON MIGRAINE 03/20/2007  . Morbid obesity ([place]) 05/24/2006  . RHINITIS, ALLERGIC 03/14/2006  . GASTROESOPHAGEAL REFLUX, NO ESOPHAGITIS 03/14/2006   Voncille Lo, PT Certified Exercise Expert for the Aging Adult  10/16/16 11:34 AM Phone: [phone] Fax: [facility] [address] [place], [place], [address] Phone: [phone]   Fax:  [phone]  Name: [name] MRN: [id number] Date of Birth: 06/14/1980

## 2016-10-18 ENCOUNTER — Ambulatory Visit: Payer: Medicare Other | Admitting: Physical Therapy

## 2016-10-23 ENCOUNTER — Ambulatory Visit: Payer: Medicare Other | Admitting: Physical Therapy

## 2016-10-23 ENCOUNTER — Encounter: Payer: Self-pay | Admitting: Physical Therapy

## 2016-10-23 DIAGNOSIS — R293 Abnormal posture: Secondary | ICD-10-CM | POA: Diagnosis not present

## 2016-10-23 DIAGNOSIS — M5440 Lumbago with sciatica, unspecified side: Secondary | ICD-10-CM | POA: Diagnosis not present

## 2016-10-23 NOTE — Therapy (Signed)
[place], [place], [address] Phone: [phone]   Fax:  [phone]  Physical Therapy Treatment  Patient Details  Name: [name] MRN: [id number] Date of Birth: [birth year] Referring Provider: Dr. Chrisandra Netters  Encounter Date: 10/23/2016      PT End of Session - 10/23/16 0955    Visit Number 9   Number of Visits 16   Date for PT Re-Evaluation 11/13/16   PT Start Time 0938   PT Stop Time 1030   PT Time Calculation (min) 52 min   Activity Tolerance Patient tolerated treatment well   Behavior During Therapy [facility] for tasks assessed/performed      Past Medical History:  Diagnosis Date  . Asthma     reservations to 2-3 timmes  a year that required ED visits/hospitalizations. Never intubated  . GERD (gastroesophageal reflux disease)   . Seasonal allergies     Past Surgical History:  Procedure Laterality Date  . CESAREAN SECTION    . CESAREAN SECTION N/A 10/25/2012   Procedure: CESAREAN SECTION;  Surgeon: Maeola Sarah. Landry Mellow, MD;  Location: [facility];  Service: Obstetrics;  Laterality: N/A;  . CESAREAN SECTION N/A 03/05/2014   Procedure: CESAREAN SECTION;  Surgeon: Delice Lesch, MD;  Location: [facility];  Service: Obstetrics;  Laterality: N/A;    There were no vitals filed for this visit.      Subjective Assessment - 10/23/16 0957    Subjective I think I overdid it packing clothes from racks and packing in boxes and loading on pallat.    Currently in Pain? Yes   Pain Score 3    Pain Location Back  and on back of legs   Pain Orientation Right   Pain Descriptors / Indicators Aching   Pain Type Acute pain   Pain Onset In the past 7 days   Pain Frequency Intermittent   Aggravating Factors  activity and lifting heavy object 60 lbs                         [facility] Adult PT Treatment/Exercise - 10/23/16 0958      Self-Care   Self-Care Lifting   Lifting pt demo proper lifting with  25 lb and 60 lb boxes and verbaized and demo understanding of lift and proper breathing technique     Lumbar Exercises: Stretches   Lower Trunk Rotation 10 seconds   Lower Trunk Rotation Limitations 10 reps   Prone on Elbows Stretch 10 seconds   Prone on Elbows Stretch Limitations x 10 reps   Piriformis Stretch 2 reps;30 seconds   Piriformis Stretch Limitations supine bil with VC     Lumbar Exercises: Supine   Clam 10 reps   Clam Limitations 2 sets, bilat. and unilat feet on the ball to destabilize    Bent Knee Raise 10 reps   Bent Knee Raise Limitations feet on ball (small)    Bridge 10 reps;Other (comment)   Bridge Limitations ball squeeze     Moist Heat Therapy   Number Minutes Moist Heat 10 Minutes   Moist Heat Location Lumbar Spine  while stretching right QL in left sidelying     Electrical Stimulation   Electrical Stimulation Location Lumbar   Electrical Stimulation Action IFC   Electrical Stimulation Parameters 14   Electrical Stimulation Goals Pain     Manual Therapy   Manual Therapy Myofascial release   Soft tissue mobilization QL and right gluteals  Myofascial Release right QL           Trigger Point Dry Needling - 10/23/16 1002    Consent Given? Yes   Education Handout Provided Yes  verbally only   Muscles Treated Upper Body Quadratus Lumborum  right side only   Longissimus Response Twitch response elicited;Palpable increased muscle length   Gluteus Maximus Response Twitch response elicited;Palpable increased muscle length   Gluteus Minimus Response Twitch response elicited;Palpable increased muscle length                PT Short Term Goals - 10/08/16 0955      PT SHORT TERM GOAL #1   Title Pt will be I with initial HEP for low back, trunk and hip flexibility, core.    Baseline met for flexibility, given core today    Status Partially Met     PT SHORT TERM GOAL #2   Title Pt will be able to get on and off mat table with ease, no increase  in pain.    Status Achieved     PT SHORT TERM GOAL #3   Title Pt will be able to report less difficulty with tasks related to child care due to 25% less pain overall.    Status Achieved           PT Long Term Goals - 10/08/16 0956      PT LONG TERM GOAL #1   Title Pt will be able to carry , lift child with good technique and no increase in back pain.    Status Unable to assess     PT LONG TERM GOAL #2   Title Pt will be able to stand for 30 min without lasting back pain (75% of the time) for childcare, community mobility.    Status Achieved     PT LONG TERM GOAL #3   Title Pt will returm to modified exercises 2-3 days per week to maximize function and fitness levels without increasing back pain.    Baseline hasbeen able to walk for fitness a few times    Status Partially Met     PT LONG TERM GOAL #4   Title Pt will be I with HEP as of last visit at discharge    Status On-going     PT LONG TERM GOAL #5   Title FOTO score will improve to 35% or better to show functional improvement    Status Unable to assess               Plan - 10/23/16 1014    Clinical Impression Statement Pt is feeling 2-3/10 pain today after packing and hauling boxes this past weekend.  Pt was educated and had demonstrate proper lifting to protect back with 25# and 60# boxes.  Pt then performed part of HEP to try to alleviate discomfort from lifting.  Pt consented to trigger point dry needling for right Quadratus and gluteal and then recieved e stim post needling today .  Pt was closely monitired throughtout TDN process.  Pt recognized she was lifting and twisting with the increased poundage.  Pain should quickly subside.  Pt has 2 addtional appointments before discharge to gym and HEP   Rehab Potential Excellent   PT Frequency 2x / week   PT Duration 8 weeks   PT Treatment/Interventions ADLs/Self Care Home Management;Electrical Stimulation;Therapeutic exercise;Balance  training;Cryotherapy;Ultrasound;Traction;Therapeutic activities;Patient/family education;Manual techniques;Neuromuscular re-education;Dry needling;Passive range of motion;Functional mobility training;Moist Heat   PT Next Visit Plan REview and progress  HEP as needed for abdominal control.  Pt reviewed lifting last visit.  Please do FOTO. Pt will have 2 more visits after this time  with jen Paa and end with Donnetta Simpers for TDN or soft tissue as needed for last visit.    PT Home Exercise Plan LTR , PPT and prone , press up. , QL stretch.  Lumbar stab 1, gym equipment   Consulted and Agree with Plan of Care Patient      Patient will benefit from skilled therapeutic intervention in order to improve the following deficits and impairments:  Decreased activity tolerance, Decreased strength, Increased fascial restricitons, Impaired flexibility, Pain, Postural dysfunction, Obesity, Improper body mechanics, Decreased range of motion, Increased muscle spasms, Hypomobility, Difficulty walking, Decreased mobility, Decreased balance  Visit Diagnosis: Acute bilateral low back pain with sciatica, sciatica laterality unspecified  Abnormal posture     Problem List Patient Active Problem List   Diagnosis Date Noted  . Acute bilateral low back pain with bilateral sciatica 09/10/2016  . Contraception management 03/17/2015  . Large breasts 07/13/2014  . S/P repeat low transverse C-section 03/05/2014  . Coccyx pain 01/03/2014  . Right shoulder pain 07/24/2013  . Pes planus 07/10/2012  . Plantar fasciitis, bilateral 07/10/2012  . Drug abuse, marijuana 01/20/2011  . Moderate persistent allergic asthma without complication 74/45/1460  . CONSTIPATION, CHRONIC 09/10/2008  . COMMON MIGRAINE 03/20/2007  . Morbid obesity ([place]) 05/24/2006  . RHINITIS, ALLERGIC 03/14/2006  . GASTROESOPHAGEAL REFLUX, NO ESOPHAGITIS 03/14/2006    Voncille Lo, PT Certified Exercise Expert for the Aging Adult  10/23/16 11:55  AM Phone: [phone] Fax: [place] [facility] [address] [place], [place], [address] Phone: [phone]   Fax:  [phone]  Name: [name] MRN: [id number] Date of Birth: 20-Mar-1980

## 2016-10-25 ENCOUNTER — Encounter: Payer: Self-pay | Admitting: Physical Therapy

## 2016-10-25 ENCOUNTER — Ambulatory Visit: Payer: Medicare Other | Admitting: Physical Therapy

## 2016-10-25 DIAGNOSIS — M5440 Lumbago with sciatica, unspecified side: Secondary | ICD-10-CM

## 2016-10-25 DIAGNOSIS — R293 Abnormal posture: Secondary | ICD-10-CM

## 2016-10-25 NOTE — Therapy (Signed)
[place], [place], [address] Phone: [phone]   Fax:  [phone]  Physical Therapy Treatment  Patient Details  Name: [name] MRN: [id number] Date of Birth: 1980/06/29 Referring Provider: Dr. Chrisandra Netters  Encounter Date: 10/25/2016      PT End of Session - 10/25/16 1013    Visit Number 10   Number of Visits 16   Date for PT Re-Evaluation 11/13/16   PT Start Time 0938   PT Stop Time 1027   PT Time Calculation (min) 49 min   Activity Tolerance Patient tolerated treatment well   Behavior During Therapy [facility] for tasks assessed/performed      Past Medical History:  Diagnosis Date  . Asthma     reservations to 2-3 timmes  a year that required ED visits/hospitalizations. Never intubated  . GERD (gastroesophageal reflux disease)   . Seasonal allergies     Past Surgical History:  Procedure Laterality Date  . CESAREAN SECTION    . CESAREAN SECTION N/A 10/25/2012   Procedure: CESAREAN SECTION;  Surgeon: Maeola Sarah. Landry Mellow, MD;  Location: [facility];  Service: Obstetrics;  Laterality: N/A;  . CESAREAN SECTION N/A 03/05/2014   Procedure: CESAREAN SECTION;  Surgeon: Delice Lesch, MD;  Location: [facility];  Service: Obstetrics;  Laterality: N/A;    There were no vitals filed for this visit.      Subjective Assessment - 10/25/16 0940    Subjective I'm a little sore today, 3/10., from the dry needling.  Better than yesterday.    Currently in Pain? Yes   Pain Score 3    Pain Location Back   Pain Orientation Right   Pain Descriptors / Indicators Aching;Sore   Pain Type Acute pain   Pain Onset 1 to 4 weeks ago   Pain Frequency Intermittent   Aggravating Factors  lifting, overactivity    Pain Relieving Factors meds, heat, exercises , dry needling             [facility] Adult PT Treatment/Exercise - 10/25/16 0001      Lumbar Exercises: Stretches   Active Hamstring Stretch 2 reps;30 seconds   Lower Trunk Rotation 10 seconds   Lower Trunk Rotation Limitations 10 reps   Piriformis Stretch 2 reps;30 seconds     Lumbar Exercises: Aerobic   Stationary Bike Nustep L5 , 6 min UE and LE      Lumbar Exercises: Supine   Clam 10 reps   Clam Limitations 2 sets, bilat. and unilateral clam with blue band    Bent Knee Raise 10 reps   Bridge 10 reps   Bridge Limitations blue band      Lumbar Exercises: Sidelying   Clam 10 reps   Other Sidelying Lumbar Exercises sidelying upper trunk rotation x 5 each      Moist Heat Therapy   Number Minutes Moist Heat 10 Minutes   Moist Heat Location Lumbar Spine     Electrical Stimulation   Electrical Stimulation Location Lumbar   Electrical Stimulation Action IFC   Electrical Stimulation Parameters 12   Electrical Stimulation Goals Pain                PT Education - 10/25/16 1156    Education provided Yes   Education Details core and progression    Person(s) Educated Patient   Methods Explanation   Comprehension Verbalized understanding          PT Short Term Goals - 10/25/16 [phone]  PT SHORT TERM GOAL #1   Title Pt will be I with initial HEP for low back, trunk and hip flexibility, core.    Status Achieved     PT SHORT TERM GOAL #2   Title Pt will be able to get on and off mat table with ease, no increase in pain.    Status Achieved     PT SHORT TERM GOAL #3   Title Pt will be able to report less difficulty with tasks related to child care due to 25% less pain overall.    Status Achieved           PT Long Term Goals - 10/25/16 0959      PT LONG TERM GOAL #1   Title Pt will be able to carry , lift child with good technique and no increase in back pain.      PT LONG TERM GOAL #2   Title Pt will be able to stand for 30 min without lasting back pain (75% of the time) for childcare, community mobility.    Status Achieved     PT LONG TERM GOAL #3   Title Pt will returm to modified exercises 2-3 days per week to  maximize function and fitness levels without increasing back pain.    Status Partially Met     PT LONG TERM GOAL #4   Title Pt will be I with HEP as of last visit at discharge    Status On-going     PT LONG TERM GOAL #5   Title FOTO score will improve to 35% or better to show functional improvement                Plan - 10/25/16 1157    Clinical Impression Statement Patient still sore from dry needling, able to reduce pain with stretching anf IFC repeated.  She has one more appt to finalize HEP, practice lifting and holding, carrying and transferring child.  FOTO score improved only 2% but pt has much less pain that when she began.  Recent flare up may have interfered with positive outcome on FOTO.    PT Next Visit Plan REview and progress HEP as needed for abdominal control.  Pt reviewed lifting last visit.  FOTO done, DC    PT Home Exercise Plan LTR , PPT and prone , press up. , QL stretch.  Lumbar stab 1, gym equipment   Consulted and Agree with Plan of Care Patient      Patient will benefit from skilled therapeutic intervention in order to improve the following deficits and impairments:  Decreased activity tolerance, Decreased strength, Increased fascial restricitons, Impaired flexibility, Pain, Postural dysfunction, Obesity, Improper body mechanics, Decreased range of motion, Increased muscle spasms, Hypomobility, Difficulty walking, Decreased mobility, Decreased balance  Visit Diagnosis: Acute bilateral low back pain with sciatica, sciatica laterality unspecified  Abnormal posture     Problem List Patient Active Problem List   Diagnosis Date Noted  . Acute bilateral low back pain with bilateral sciatica 09/10/2016  . Contraception management 03/17/2015  . Large breasts 07/13/2014  . S/P repeat low transverse C-section 03/05/2014  . Coccyx pain 01/03/2014  . Right shoulder pain 07/24/2013  . Pes planus 07/10/2012  . Plantar fasciitis, bilateral 07/10/2012  . Drug  abuse, marijuana 01/20/2011  . Moderate persistent allergic asthma without complication 12/28/2009  . CONSTIPATION, CHRONIC 09/10/2008  . COMMON MIGRAINE 03/20/2007  . Morbid obesity (HCC) 05/24/2006  . RHINITIS, ALLERGIC 03/14/2006  . GASTROESOPHAGEAL REFLUX, NO  ESOPHAGITIS 03/14/2006    [name] 10/25/2016, 12:03 PM  [facility] [address] [place], [place], [address] Phone: [phone]   Fax:  [phone]  Name: Frank Pilger MRN: [id number] Date of Birth: 02-Feb-1980  Raeford Razor, PT 10/25/16 12:03 PM Phone: [phone] Fax: [phone]

## 2016-10-30 ENCOUNTER — Encounter: Payer: Medicare Other | Admitting: Physical Therapy

## 2016-11-01 ENCOUNTER — Encounter: Payer: Self-pay | Admitting: Physical Therapy

## 2016-11-01 ENCOUNTER — Ambulatory Visit: Payer: Medicare Other | Admitting: Physical Therapy

## 2016-11-01 DIAGNOSIS — M5440 Lumbago with sciatica, unspecified side: Secondary | ICD-10-CM

## 2016-11-01 DIAGNOSIS — R293 Abnormal posture: Secondary | ICD-10-CM | POA: Diagnosis not present

## 2016-11-01 NOTE — Therapy (Signed)
[place], [place], [address] Phone: [phone]   Fax:  [phone]  Physical Therapy Treatment/Discharge Note  Patient Details  Name: [name] MRN: [id number] Date of Birth: Jun 22, 1980 Referring Provider: Dr. Chrisandra Netters  Encounter Date: 11/01/2016      PT End of Session - 11/01/16 0940    Visit Number 11   Number of Visits 16   Date for PT Re-Evaluation 11/13/16   PT Start Time 0940   PT Stop Time 1030   PT Time Calculation (min) 50 min   Activity Tolerance Patient tolerated treatment well   Behavior During Therapy [facility] for tasks assessed/performed      Past Medical History:  Diagnosis Date  . Asthma     reservations to 2-3 timmes  a year that required ED visits/hospitalizations. Never intubated  . GERD (gastroesophageal reflux disease)   . Seasonal allergies     Past Surgical History:  Procedure Laterality Date  . CESAREAN SECTION    . CESAREAN SECTION N/A 10/25/2012   Procedure: CESAREAN SECTION;  Surgeon: Maeola Sarah. Landry Mellow, MD;  Location: [facility];  Service: Obstetrics;  Laterality: N/A;  . CESAREAN SECTION N/A 03/05/2014   Procedure: CESAREAN SECTION;  Surgeon: Delice Lesch, MD;  Location: [facility];  Service: Obstetrics;  Laterality: N/A;    There were no vitals filed for this visit.      Subjective Assessment - 11/01/16 0942    Subjective I haven't been to the gym, I was sore until a couple of days ago   Limitations Sitting;Lifting;Standing;Walking;House hold activities   How long can you sit comfortably? can sit unlimited   How long can you stand comfortably? unlimited   How long can you walk comfortably? unlimited   Diagnostic tests XR done in ED   Patient Stated Goals To get back to me my regular self, was exercising.     Currently in Pain? No/denies   Pain Location Back            [facility] PT Assessment - 11/01/16 0943      Assessment   Medical Diagnosis low back  pain    Onset Date/Surgical Date 09/03/16     Observation/Other Assessments   Focus on Therapeutic Outcomes (FOTO)  FOTO intake 67%  limitation 33%  predicted 33%     Posture/Postural Control   Posture Comments even pelvis     AROM   Lumbar Flexion 80  pt with slight discomfort coming to stand, VC for TrABD   Lumbar Extension 25   Lumbar - Right Side Bend 30   Lumbar - Left Side Bend 25   Lumbar - Right Rotation 80%  tightness but no pain   Lumbar - Left Rotation 80%   tightness but no pain                     [facility] Adult PT Treatment/Exercise - 11/01/16 0943      Self-Care   Self-Care Other Self-Care Comments;Posture;Lifting;ADL's   ADL's reviewed key concepts   Lifting reviewed key concepts on lifting childre   Posture anchoring sacrum when rising from chair to prevent bending at lumbar fulcrum. overuse   Other Self-Care Comments  gym equipment quesition     Lumbar Exercises: Stretches   Active Hamstring Stretch 2 reps;30 seconds   Lower Trunk Rotation 10 seconds   Lower Trunk Rotation Limitations 10 reps   Prone on Elbows Stretch 10 seconds   Prone on  Elbows Stretch Limitations x 10 reps   Piriformis Stretch 2 reps;30 seconds     Moist Heat Therapy   Number Minutes Moist Heat 14 Minutes   Moist Heat Location Lumbar Spine     Electrical Stimulation   Electrical Stimulation Location Lumbar   Electrical Stimulation Action IFC   Electrical Stimulation Parameters 14   Electrical Stimulation Goals Pain     Manual Therapy   Manual Therapy Myofascial release;Soft tissue mobilization   Soft tissue mobilization bil paraspinals and gluteals   Myofascial Release bil QL                PT Education - 11/01/16 1000    Education provided Yes   Education Details reviewed questions about HEP and reviewed stretches and use of gym for DC   Person(s) Educated Patient   Methods Explanation;Demonstration;Verbal cues   Comprehension Verbalized  understanding;Returned demonstration          PT Short Term Goals - 10/25/16 0954      PT SHORT TERM GOAL #1   Title Pt will be I with initial HEP for low back, trunk and hip flexibility, core.    Status Achieved     PT SHORT TERM GOAL #2   Title Pt will be able to get on and off mat table with ease, no increase in pain.    Status Achieved     PT SHORT TERM GOAL #3   Title Pt will be able to report less difficulty with tasks related to child care due to 25% less pain overall.    Status Achieved           PT Long Term Goals - 11/01/16 0950      PT LONG TERM GOAL #1   Title Pt will be able to carry , lift child with good technique and no increase in back pain.    Baseline Pt able to lift 2 children 40 and 50 lb without difficulty   Time 8   Period Weeks   Status Achieved     PT LONG TERM GOAL #2   Title Pt will be able to stand for 30 min without lasting back pain (75% of the time) for childcare, community mobility.    Baseline at least 30 to 45 minutes and then needs to shift   Time 8   Period Weeks   Status Achieved     PT LONG TERM GOAL #3   Title Pt will returm to modified exercises 2-3 days per week to maximize function and fitness levels without increasing back pain.    Baseline has returned to the gym   Time 8   Period Weeks   Status Achieved     PT LONG TERM GOAL #4   Title Pt will be I with HEP as of last visit at discharge    Baseline able to demo HEP independently advanced   Time 8   Period Weeks   Status Achieved     PT LONG TERM GOAL #5   Title FOTO score will improve to 35% or better to show functional improvement    Baseline Pt DC FOTO 33% limitation achieved   Time 8   Period Weeks   Status Achieved               Plan - 11/01/16 1453    Clinical Impression Statement Pt presents with no pain and currently continuing HEP and strengthening at gym [employer].   Pt with increased AROM  of lumbar and has achieved all goals and  improved FOTO score to 33%  predicted FOTO 33%  Pt had flare last time in which she continued HEP and routine and was able to manage well on her own.  Pt is ready for DC   Rehab Potential Excellent   PT Frequency 2x / week   PT Duration 8 weeks   PT Treatment/Interventions ADLs/Self Care Home Management;Electrical Stimulation;Therapeutic exercise;Balance training;Cryotherapy;Ultrasound;Traction;Therapeutic activities;Patient/family education;Manual techniques;Neuromuscular re-education;Dry needling;Passive range of motion;Functional mobility training;Moist Heat   PT Next Visit Plan DC   PT Home Exercise Plan LTR , PPT and prone , press up. , QL stretch.  Lumbar stab 1, gym equipment   Consulted and Agree with Plan of Care Patient      Patient will benefit from skilled therapeutic intervention in order to improve the following deficits and impairments:  Decreased activity tolerance, Decreased strength, Increased fascial restricitons, Impaired flexibility, Pain, Postural dysfunction, Obesity, Improper body mechanics, Decreased range of motion, Increased muscle spasms, Hypomobility, Difficulty walking, Decreased mobility, Decreased balance  Visit Diagnosis: Acute bilateral low back pain with sciatica, sciatica laterality unspecified  Abnormal posture       G-Codes - 11/02/16 1452    Functional Assessment Tool Used (Outpatient Only) FOTO   Functional Limitation Mobility: Walking and moving around   Mobility: Walking and Moving Around Goal Status [phone]) At least 20 percent but less than 40 percent impaired, limited or restricted  33%   Mobility: Walking and Moving Around Discharge Status [phone]) At least 20 percent but less than 40 percent impaired, limited or restricted  33%      Problem List Patient Active Problem List   Diagnosis Date Noted  . Acute bilateral low back pain with bilateral sciatica 09/10/2016  . Contraception management 03/17/2015  . Large breasts 07/13/2014  . S/P  repeat low transverse C-section 03/05/2014  . Coccyx pain 01/03/2014  . Right shoulder pain 07/24/2013  . Pes planus 07/10/2012  . Plantar fasciitis, bilateral 07/10/2012  . Drug abuse, marijuana 01/20/2011  . Moderate persistent allergic asthma without complication 20/10/710  . CONSTIPATION, CHRONIC 09/10/2008  . COMMON MIGRAINE 03/20/2007  . Morbid obesity ([place]) 05/24/2006  . RHINITIS, ALLERGIC 03/14/2006  . GASTROESOPHAGEAL REFLUX, NO ESOPHAGITIS 03/14/2006    Dorothea Ogle 11/02/2016, 2:57 PM  [facility] [address] [place], [place], [address] Phone: [phone]   Fax:  [phone]  Name: Lusero Nordlund MRN: [id number] Date of Birth: Apr 21, 1980   PHYSICAL THERAPY DISCHARGE SUMMARY  Visits from Start of Care: 11  Current functional level related to goals / functional outcomes: As above   Remaining deficits: none   Education / Equipment: HEP and education on gym equipment Plan: Patient agrees to discharge.  Patient goals were met. Patient is being discharged due to meeting the stated rehab goals.  ?????    and being pleased with current functional level  Voncille Lo, PT Certified Exercise Expert for the Aging Adult  11/02/2016 2:57 PM Phone: [phone] Fax: [phone]

## 2016-11-06 ENCOUNTER — Encounter: Payer: Medicare Other | Admitting: Physical Therapy

## 2016-11-08 ENCOUNTER — Encounter: Payer: Medicare Other | Admitting: Physical Therapy

## 2016-12-04 ENCOUNTER — Encounter (HOSPITAL_COMMUNITY): Payer: Self-pay | Admitting: Emergency Medicine

## 2016-12-04 ENCOUNTER — Emergency Department (HOSPITAL_COMMUNITY)
Admission: EM | Admit: 2016-12-04 | Discharge: 2016-12-05 | Disposition: A | Discharge status: Home / Self Care | Payer: Medicare Other | Attending: Emergency Medicine | Admitting: Emergency Medicine

## 2016-12-04 DIAGNOSIS — T24221A Burn of second degree of right knee, initial encounter: Secondary | ICD-10-CM | POA: Diagnosis not present

## 2016-12-04 DIAGNOSIS — T24232A Burn of second degree of left lower leg, initial encounter: Secondary | ICD-10-CM | POA: Insufficient documentation

## 2016-12-04 DIAGNOSIS — Z79899 Other long term (current) drug therapy: Secondary | ICD-10-CM | POA: Diagnosis not present

## 2016-12-04 DIAGNOSIS — T24239A Burn of second degree of unspecified lower leg, initial encounter: Secondary | ICD-10-CM | POA: Diagnosis not present

## 2016-12-04 DIAGNOSIS — T24029A Burn of unspecified degree of unspecified knee, initial encounter: Secondary | ICD-10-CM | POA: Diagnosis present

## 2016-12-04 DIAGNOSIS — X102XXA Contact with fats and cooking oils, initial encounter: Secondary | ICD-10-CM | POA: Insufficient documentation

## 2016-12-04 DIAGNOSIS — Y92010 Kitchen of single-family (private) house as the place of occurrence of the external cause: Secondary | ICD-10-CM | POA: Diagnosis not present

## 2016-12-04 DIAGNOSIS — T24209A Burn of second degree of unspecified site of unspecified lower limb, except ankle and foot, initial encounter: Secondary | ICD-10-CM

## 2016-12-04 DIAGNOSIS — T25222A Burn of second degree of left foot, initial encounter: Secondary | ICD-10-CM | POA: Insufficient documentation

## 2016-12-04 DIAGNOSIS — Y93G1 Activity, food preparation and clean up: Secondary | ICD-10-CM | POA: Diagnosis not present

## 2016-12-04 DIAGNOSIS — Z87891 Personal history of nicotine dependence: Secondary | ICD-10-CM | POA: Diagnosis not present

## 2016-12-04 DIAGNOSIS — T24231A Burn of second degree of right lower leg, initial encounter: Secondary | ICD-10-CM | POA: Diagnosis not present

## 2016-12-04 DIAGNOSIS — T310 Burns involving less than 10% of body surface: Secondary | ICD-10-CM | POA: Diagnosis not present

## 2016-12-04 DIAGNOSIS — J45909 Unspecified asthma, uncomplicated: Secondary | ICD-10-CM | POA: Diagnosis not present

## 2016-12-04 DIAGNOSIS — Y998 Other external cause status: Secondary | ICD-10-CM | POA: Insufficient documentation

## 2016-12-04 DIAGNOSIS — T24222A Burn of second degree of left knee, initial encounter: Secondary | ICD-10-CM | POA: Diagnosis not present

## 2016-12-04 LAB — I-STAT BETA HCG BLOOD, ED (MC, WL, AP ONLY): I-stat hCG, quantitative: <5 m[IU]/mL (ref <5)

## 2016-12-04 MED ORDER — SODIUM CHLORIDE 0.9 % IV BOLUS (SEPSIS)
1000.0000 mL | Freq: Once | INTRAVENOUS | Status: AC
  Administered 2016-12-04: 1000 mL via INTRAVENOUS

## 2016-12-04 MED ORDER — TETANUS-DIPHTH-ACELL PERTUSSIS 5-2.5-18.5 LF-MCG/0.5 IM SUSP
0.5000 mL | Freq: Once | INTRAMUSCULAR | Status: AC
  Administered 2016-12-04: 0.5 mL via INTRAMUSCULAR
  Filled 2016-12-04: qty 0.5

## 2016-12-04 MED ORDER — HYDROMORPHONE HCL 1 MG/ML IJ SOLN
1.0000 mg | Freq: Once | INTRAMUSCULAR | Status: AC
  Administered 2016-12-04: 1 mg via INTRAVENOUS
  Filled 2016-12-04: qty 1

## 2016-12-04 MED ORDER — OXYCODONE HCL 5 MG PO TABS: 2.5000 mg | ORAL_TABLET | ORAL | 0 refills | Status: DC | PRN

## 2016-12-04 MED ORDER — ONDANSETRON HCL 4 MG PO TABS: 4.0000 mg | ORAL_TABLET | Freq: Three times a day (TID) | ORAL | 0 refills | Status: DC | PRN

## 2016-12-04 MED ORDER — MELOXICAM 15 MG PO TABS: 15.0000 mg | ORAL_TABLET | Freq: Every day | ORAL | 0 refills | Status: DC

## 2016-12-04 MED ORDER — SILVER SULFADIAZINE 1 % EX CREA
TOPICAL_CREAM | Freq: Once | CUTANEOUS | Status: AC
  Administered 2016-12-04: 22:00:00 via TOPICAL
  Filled 2016-12-04: qty 85

## 2016-12-04 MED ORDER — DOCUSATE SODIUM 250 MG PO CAPS: 250.0000 mg | ORAL_CAPSULE | Freq: Every day | ORAL | 0 refills | Status: DC

## 2016-12-04 MED ORDER — ONDANSETRON HCL 4 MG/2ML IJ SOLN
4.0000 mg | Freq: Once | INTRAMUSCULAR | Status: AC
  Administered 2016-12-04: 4 mg via INTRAVENOUS
  Filled 2016-12-04: qty 2
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
## 2016-12-04 NOTE — Discharge Instructions (Signed)
Try moist or hydrogel bun pads these are available at [facility]/ [facility] and you can buy bulk on [address] [place].  Use the silvadene 2x daily and change your dressings. Follow up with Dr. Ulice Boldillingham to have your wounds checked and managed.
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
## 2016-12-13 ENCOUNTER — Ambulatory Visit: Payer: Medicare Other | Admitting: Family Medicine

## 2016-12-31 ENCOUNTER — Other Ambulatory Visit: Payer: Self-pay | Admitting: Family Medicine

## 2017-03-04 ENCOUNTER — Encounter: Payer: Self-pay | Admitting: Family Medicine

## 2017-03-04 ENCOUNTER — Ambulatory Visit (INDEPENDENT_AMBULATORY_CARE_PROVIDER_SITE_OTHER): Payer: Medicare Other | Admitting: Family Medicine

## 2017-03-04 ENCOUNTER — Other Ambulatory Visit: Payer: Self-pay

## 2017-03-04 VITALS — BP 118/78 | HR 91 | Temp 98.7°F | Ht 66.0 in | Wt 246.8 lb

## 2017-03-04 DIAGNOSIS — R1011 Right upper quadrant pain: Secondary | ICD-10-CM | POA: Diagnosis not present

## 2017-03-04 DIAGNOSIS — Z3202 Encounter for pregnancy test, result negative: Secondary | ICD-10-CM | POA: Diagnosis not present

## 2017-03-04 LAB — POCT URINE PREGNANCY: PREG TEST UR: NEGATIVE

## 2017-03-04 NOTE — Assessment & Plan Note (Signed)
Acute.  Limited to right upper quadrant.  History suspicious for food poisoning without persistent vomiting and diarrhea as of today.  Considered intercostal tenderness may be from history of vomiting though left intercostal unremarkable making this unlikely.  Patient without jaundice.  Pain is not colicky in nature though Eulah PontMurphy sign is positive.  Family history positive for gallbladder disease including mother.   - RUQ ultrasound - CBC with differential, CMET, lipase, urine pregnancy - Reviewed return precautions - Advised patient to avoid NSAIDs

## 2017-03-04 NOTE — Patient Instructions (Signed)
Thank you for coming in to see us today. Please see below to review our plan for today's visit.  1.  I will call you regarding your right upper quadrant ultrasound results.  If your lab work is abnormal I will call you also, otherwise you will receive the results in the mail. 2.  Avoid taking over-the-counter NSAIDs including ibuprofen, Motrin, Aleve.    Please call the clinic at [phone] if your symptoms worsen or you have any concerns. It was our pleasure to serve you.  Durward Parcelavid Vaden Becherer, DO [facility], PGY-2

## 2017-03-04 NOTE — Progress Notes (Signed)
   Subjective   Patient ID: [name]    DOB: 1980/11/20, 37 y.o. female   MRN: [id number]  CC: "Food poisoning"  HPI: [name] is a 37 y.o. female who presents for a same day appointment for the following:  ABDOMINAL PAIN  Onset: 3 days ago Medications tried: Ibuprofen and Gas-X without improvement Similar pain before: No Prior abdominal surgeries: C-section x3  Symptoms Nausea/vomiting: Originally now resolved Diarrhea: Originally now resolved Constipation: No Blood in stool: No Blood in vomit: No Fever: No Dysuria: No Loss of appetite: Yes Weight loss: No Vaginal Bleeding: No Missed menstrual period: No  ROS: see HPI for pertinent.  [place]: Morbid obesity, GERD, marijuana use, chronic constipation, lumbar back pain with bilateral sciatica, migraines.  Surgical history C-section x3.  Family history HTN, asthma.  Smoking status reviewed. Medications reviewed.  Objective   BP 118/78   Pulse 91   Temp 98.7 F (37.1 C) (Oral)   Ht 5' 6" (1.676 m)   Wt 246 lb 12.8 oz (111.9 kg)   SpO2 98%   BMI 39.83 kg/m  Vitals and nursing note reviewed.  General: obese, NAD with non-toxic appearance HEENT: normocephalic, atraumatic, moist mucous membranes, no scleral icterus Cardiovascular: regular rate and rhythm without murmurs, rubs, or gallops Lungs: clear to auscultation bilaterally with normal work of breathing Abdomen: soft, non-distended, normoactive bowel sounds, tender on right upper quadrant without rebound or guarding, no masses, no hepatosplenomegaly Skin: warm, dry, no rashes or lesions, cap refill < 2 seconds Extremities: warm and well perfused, normal tone, no edema  Assessment & Plan   RUQ abdominal pain Acute.  Limited to right upper quadrant.  History suspicious for food poisoning without persistent vomiting and diarrhea as of today.  Considered intercostal tenderness may be from history of vomiting though left intercostal unremarkable making this  unlikely.  Patient without jaundice.  Pain is not colicky in nature though Percell Miller sign is positive.  Family history positive for gallbladder disease including mother.   - RUQ ultrasound - CBC with differential, CMET, lipase, urine pregnancy - Reviewed return precautions - Advised patient to avoid NSAIDs  Orders Placed This Encounter  Procedures  . US Abdomen Limited RUQ    Standing Status:   Future    Standing Expiration Date:   05/03/2018    Order Specific Question:   Reason for Exam (SYMPTOM  OR DIAGNOSIS REQUIRED)    Answer:   Abdominal pain    Order Specific Question:   Preferred imaging location?    Answer:   [facility]  . CBC with Differential/Platelet  . CMP14+EGFR  . Lipase  . POCT urine pregnancy   No orders of the defined types were placed in this encounter.   Harriet [place], [place], PGY-2 03/04/2017, 12:17 PM

## 2017-03-05 LAB — CBC WITH DIFFERENTIAL/PLATELET
Basophils Absolute: 0 10*3/uL (ref 0.0–0.2)
Basos: 0 %
EOS (ABSOLUTE): 0.1 10*3/uL (ref 0.0–0.4)
EOS: 1 %
HEMATOCRIT: 35 % (ref 34.0–46.6)
HEMOGLOBIN: 11.2 g/dL (ref 11.1–15.9)
IMMATURE GRANULOCYTES: 0 %
Immature Grans (Abs): 0 10*3/uL (ref 0.0–0.1)
Lymphocytes Absolute: 1.9 10*3/uL (ref 0.7–3.1)
Lymphs: 19 %
MCH: 25.2 pg — ABNORMAL LOW (ref 26.6–33.0)
MCHC: 32 g/dL (ref 31.5–35.7)
MCV: 79 fL (ref 79–97)
MONOS ABS: 1.1 10*3/uL — AB (ref 0.1–0.9)
Monocytes: 12 %
NEUTROS PCT: 68 %
Neutrophils Absolute: 6.6 10*3/uL (ref 1.4–7.0)
Platelets: 393 10*3/uL — ABNORMAL HIGH (ref 150–379)
RBC: 4.45 x10E6/uL (ref 3.77–5.28)
RDW: 14.4 % (ref 12.3–15.4)
WBC: 9.9 10*3/uL (ref 3.4–10.8)

## 2017-03-05 LAB — CMP14+EGFR
A/G RATIO: 1.2 (ref 1.2–2.2)
ALBUMIN: 4.2 g/dL (ref 3.5–5.5)
ALT: 16 IU/L (ref 0–32)
AST: 12 IU/L (ref 0–40)
Alkaline Phosphatase: 66 IU/L (ref 39–117)
BUN / CREAT RATIO: 12 (ref 9–23)
BUN: 8 mg/dL (ref 6–20)
Bilirubin Total: 0.3 mg/dL (ref 0.0–1.2)
CALCIUM: 9.6 mg/dL (ref 8.7–10.2)
CO2: 24 mmol/L (ref 20–29)
CREATININE: 0.68 mg/dL (ref 0.57–1.00)
Chloride: 99 mmol/L (ref 96–106)
GFR, EST AFRICAN AMERICAN: 131 mL/min/{1.73_m2} (ref >59)
GFR, EST NON AFRICAN AMERICAN: 114 mL/min/{1.73_m2} (ref >59)
GLOBULIN, TOTAL: 3.4 g/dL (ref 1.5–4.5)
Glucose: 77 mg/dL (ref 65–99)
POTASSIUM: 4.3 mmol/L (ref 3.5–5.2)
SODIUM: 139 mmol/L (ref 134–144)
TOTAL PROTEIN: 7.6 g/dL (ref 6.0–8.5)

## 2017-03-05 LAB — LIPASE: Lipase: 17 U/L (ref 14–72)

## 2017-03-07 ENCOUNTER — Ambulatory Visit (HOSPITAL_COMMUNITY)
Admission: RE | Admit: 2017-03-07 | Discharge: 2017-03-07 | Disposition: A | Discharge status: Home / Self Care | Payer: Medicare Other | Source: Ambulatory Visit | Attending: Family Medicine | Admitting: Family Medicine

## 2017-03-07 DIAGNOSIS — R1011 Right upper quadrant pain: Secondary | ICD-10-CM | POA: Diagnosis not present

## 2017-03-07 DIAGNOSIS — K8020 Calculus of gallbladder without cholecystitis without obstruction: Secondary | ICD-10-CM | POA: Diagnosis not present

## 2017-03-08 ENCOUNTER — Telehealth: Payer: Self-pay | Admitting: Family Medicine

## 2017-03-08 ENCOUNTER — Other Ambulatory Visit: Payer: Self-pay | Admitting: Family Medicine

## 2017-03-08 DIAGNOSIS — K8020 Calculus of gallbladder without cholecystitis without obstruction: Secondary | ICD-10-CM

## 2017-03-08 NOTE — Telephone Encounter (Signed)
Contacted patient regarding right upper quadrant ultrasound showing cholelithiasis.  Patient states abdominal pain has improved as of today.  She is without symptoms of fevers or chills, nausea or vomiting, jaundice.  Referred patient to general surgery for evaluation for possible cholecystectomy.  Reviewed return precautions.  Patient without further questions or concerns.  Durward Parcelavid Franck Vinal, DO [facility], PGY-2

## 2017-03-19 DIAGNOSIS — Z7689 Persons encountering health services in other specified circumstances: Secondary | ICD-10-CM | POA: Diagnosis not present

## 2017-03-27 ENCOUNTER — Encounter (HOSPITAL_COMMUNITY): Payer: Self-pay | Admitting: Emergency Medicine

## 2017-03-27 ENCOUNTER — Ambulatory Visit (HOSPITAL_COMMUNITY)
Admission: EM | Admit: 2017-03-27 | Discharge: 2017-03-27 | Disposition: A | Discharge status: Home / Self Care | Payer: Medicare Other | Attending: Family Medicine | Admitting: Family Medicine

## 2017-03-27 ENCOUNTER — Other Ambulatory Visit: Payer: Self-pay

## 2017-03-27 DIAGNOSIS — J039 Acute tonsillitis, unspecified: Secondary | ICD-10-CM

## 2017-03-27 DIAGNOSIS — H109 Unspecified conjunctivitis: Secondary | ICD-10-CM

## 2017-03-27 MED ORDER — AMOXICILLIN-POT CLAVULANATE 875-125 MG PO TABS: 1.0000 | ORAL_TABLET | Freq: Two times a day (BID) | ORAL | 0 refills | Status: DC

## 2017-03-27 MED ORDER — POLYMYXIN B-TRIMETHOPRIM 10000-0.1 UNIT/ML-% OP SOLN: 1.0000 [drp] | OPHTHALMIC | 0 refills | Status: DC

## 2017-03-27 NOTE — ED Provider Notes (Signed)
[place]   [id number] 03/27/17 Arrival Time: 1004   SUBJECTIVE:  [name] is a 37 y.o. female who presents to the urgent care with complaint of 5 days of aches, chills, eye watering and redness, and nasal congestion.  Daughter has had pink eye recently  No nausea or vomiting, no cough, no fever.  Past Medical History:  Diagnosis Date  . Asthma     reservations to 2-3 timmes  a year that required ED visits/hospitalizations. Never intubated  . GERD (gastroesophageal reflux disease)   . Seasonal allergies    Family History  Problem Relation Age of Onset  . Hypertension Mother   . Allergies Mother   . Asthma Father   . Emphysema Maternal Grandfather   . Allergies Maternal Grandmother   . Asthma Maternal Grandmother    Social History   Socioeconomic History  . Marital status: Single    Spouse name: Not on file  . Number of children: 1  . Years of education: Not on file  . Highest education level: Not on file  Social Needs  . Financial resource strain: Not on file  . Food insecurity - worry: Not on file  . Food insecurity - inability: Not on file  . Transportation needs - medical: Not on file  . Transportation needs - non-medical: Not on file  Occupational History  . Occupation: disabled    Fish farm manager: UNEMPLOYED  Tobacco Use  . Smoking status: Former Smoker    Packs/day: 0.30    Years: 0.50    Pack years: 0.15    Types: Cigarettes    Last attempt to quit: 01/15/2001    Years since quitting: 16.2  . Smokeless tobacco: Never Used  Substance and Sexual Activity  . Alcohol use: No    Alcohol/week: 0.6 oz    Types: 1 Standard drinks or equivalent per week    Comment: occasionaly  . Drug use: No  . Sexual activity: Not on file  Other Topics Concern  . Not on file  Social History Narrative  . Not on file   No outpatient medications have been marked as taking for the 03/27/17 encounter [facility] Encounter).   Allergies  Allergen Reactions  .  Iohexol Hives and Itching       . Moxifloxacin Hives and Itching      ROS: As per HPI, remainder of ROS negative.   OBJECTIVE:   Vitals:   03/27/17 1027  BP: 118/75  Pulse: 92  Resp: 18  Temp: 99.2 F (37.3 C)  TempSrc: Oral  SpO2: 99%     General appearance: alert; no distress Eyes: PERRL; EOMI; conjunctiva red with lid crusting bilaterally HENT: normocephalic; atraumatic;  oral mucosa bilateral tonsillar white exudates. Neck: supple Back: no CVA tenderness Extremities: no cyanosis or edema; symmetrical with no gross deformities Skin: warm and dry Neurologic: normal gait; grossly normal Psychological: alert and cooperative; normal mood and affect      Labs:  Results for orders placed or performed in visit on 03/04/17  CBC with Differential/Platelet  Result Value Ref Range   WBC 9.9 3.4 - 10.8 x10E3/uL   RBC 4.45 3.77 - 5.28 x10E6/uL   Hemoglobin 11.2 11.1 - 15.9 g/dL   Hematocrit 35.0 34.0 - 46.6 %   MCV 79 79 - 97 fL   MCH 25.2 (L) 26.6 - 33.0 pg   MCHC 32.0 31.5 - 35.7 g/dL   RDW 14.4 12.3 - 15.4 %   Platelets 393 (H) 150 - 379  x10E3/uL   Neutrophils 68 Not Estab. %   Lymphs 19 Not Estab. %   Monocytes 12 Not Estab. %   Eos 1 Not Estab. %   Basos 0 Not Estab. %   Neutrophils Absolute 6.6 1.4 - 7.0 x10E3/uL   Lymphocytes Absolute 1.9 0.7 - 3.1 x10E3/uL   Monocytes Absolute 1.1 (H) 0.1 - 0.9 x10E3/uL   EOS (ABSOLUTE) 0.1 0.0 - 0.4 x10E3/uL   Basophils Absolute 0.0 0.0 - 0.2 x10E3/uL   Immature Granulocytes 0 Not Estab. %   Immature Grans (Abs) 0.0 0.0 - 0.1 x10E3/uL  CMP14+EGFR  Result Value Ref Range   Glucose 77 65 - 99 mg/dL   BUN 8 6 - 20 mg/dL   Creatinine, Ser 0.68 0.57 - 1.00 mg/dL   GFR calc non Af Amer 114 >59 mL/min/1.73   GFR calc Af Amer 131 >59 mL/min/1.73   BUN/Creatinine Ratio 12 9 - 23   Sodium 139 134 - 144 mmol/L   Potassium 4.3 3.5 - 5.2 mmol/L   Chloride 99 96 - 106 mmol/L   CO2 24 20 - 29 mmol/L   Calcium 9.6 8.7 - 10.2  mg/dL   Total Protein 7.6 6.0 - 8.5 g/dL   Albumin 4.2 3.5 - 5.5 g/dL   Globulin, Total 3.4 1.5 - 4.5 g/dL   Albumin/Globulin Ratio 1.2 1.2 - 2.2   Bilirubin Total 0.3 0.0 - 1.2 mg/dL   Alkaline Phosphatase 66 39 - 117 IU/L   AST 12 0 - 40 IU/L   ALT 16 0 - 32 IU/L  Lipase  Result Value Ref Range   Lipase 17 14 - 72 U/L  POCT urine pregnancy  Result Value Ref Range   Preg Test, Ur Negative Negative    Labs Reviewed - No data to display  No results found.     ASSESSMENT & PLAN:  1. Acute tonsillitis, unspecified etiology   2. Conjunctivitis, bacterial     Meds ordered this encounter  Medications  . trimethoprim-polymyxin b (POLYTRIM) ophthalmic solution    Sig: Place 1 drop into both eyes every 4 (four) hours.    Dispense:  10 mL    Refill:  0  . amoxicillin-clavulanate (AUGMENTIN) 875-125 MG tablet    Sig: Take 1 tablet by mouth every 12 (twelve) hours.    Dispense:  14 tablet    Refill:  0    Reviewed expectations re: course of current medical issues. Questions answered. Outlined signs and symptoms indicating need for more acute intervention. Patient verbalized understanding. After Visit Summary given.     Robyn Haber, MD 03/27/17 1046

## 2017-03-27 NOTE — ED Triage Notes (Signed)
Onset of symptoms was Saturday.  Symptoms include eyes are red, eyes are watering, sore throat, stuffy nose, body aches and chills

## 2017-03-28 ENCOUNTER — Ambulatory Visit: Payer: Self-pay | Admitting: General Surgery

## 2017-03-28 DIAGNOSIS — K8020 Calculus of gallbladder without cholecystitis without obstruction: Secondary | ICD-10-CM | POA: Diagnosis not present

## 2017-03-28 NOTE — H&P (Signed)
History of Present Illness Axel Filler MD; 03/28/2017 11:50 AM) The patient is a 37 year old female who presents for evaluation of gall stones. Referred by: Dr. Durward Parcel Chief Complaint: Abdominal pain  Patient is a 37 year old female with a 2-3 month history of epigastric/right upper quadrant abdominal pain. Patient states that she recently was at a [employer] poisoning. She states that after she had some right upper quadrant abdominal pain since it was diarrhea. She states that since that time she's noticed some symptoms previous to that episode associated with fatty foods that included nausea, epigastric abdominal pain, right upper quadrant abdominal pain.  Patient with ultrasound revealed gallstones have no signs of cholecystitis Patient's LFTs were within normal limits.    Past Surgical History Ferd Glassing, RN; 03/28/2017 11:34 AM) Cesarean Section - 1  Oral Surgery   Diagnostic Studies History Ferd Glassing, RN; 03/28/2017 11:34 AM) Colonoscopy  never Mammogram  never Pap Smear  1-5 years ago  Allergies Ferd Glassing, RN; 03/28/2017 11:36 AM) Iodinated Contrast Media   Medication History Ferd Glassing, RN; 03/28/2017 11:39 AM) Singulair (4MG  Tablet Chewable, Oral) Active. Colace (100MG  Capsule, Oral) Active. Ibuprofen (200MG  Capsule, Oral) Active. Albuterol Sulfate (0.63MG /3ML Nebulized Soln, Inhalation) Active. Budesonide (1MG /2ML Suspension, Inhalation) Active. ZyrTEC Allergy (10MG  Capsule, Oral) Active. Pepcid (10MG /ML Solution, Intravenous) Active. Medications Reconciled  Social History Ferd Glassing, RN; 03/28/2017 11:34 AM) Alcohol use  Occasional alcohol use. Caffeine use  Coffee, Tea. No drug use  Tobacco use  Former smoker.  Family History Ferd Glassing, RN; 03/28/2017 11:34 AM) Arthritis  Father, Mother. Depression  Mother. Diabetes Mellitus  Father, Mother. Kidney Disease  Mother. Respiratory Condition  Father,  Mother.  Pregnancy / Birth History Ferd Glassing, RN; 03/28/2017 11:34 AM) Age at menarche  11 years. Gravida  6 Length (months) of breastfeeding  3-6 Maternal age  67-25 Para  3 Regular periods   Other Problems Ferd Glassing, RN; 03/28/2017 11:34 AM) Asthma     Review of Systems Axel Filler MD; 03/28/2017 11:49 AM) General Not Present- Appetite Loss, Chills, Fatigue, Fever, Night Sweats, Weight Gain and Weight Loss. Skin Not Present- Change in Wart/Mole, Dryness, Hives, Jaundice, New Lesions, Non-Healing Wounds, Rash and Ulcer. HEENT Not Present- Earache, Hearing Loss, Hoarseness, Nose Bleed, Oral Ulcers, Ringing in the Ears, Seasonal Allergies, Sinus Pain, Sore Throat, Visual Disturbances, Wears glasses/contact lenses and Yellow Eyes. Respiratory Not Present- Bloody sputum, Chronic Cough, Difficulty Breathing, Snoring and Wheezing. Cardiovascular Not Present- Chest Pain, Difficulty Breathing Lying Down, Leg Cramps, Palpitations, Rapid Heart Rate, Shortness of Breath and Swelling of Extremities. Gastrointestinal Not Present- Abdominal Pain, Bloating, Bloody Stool, Change in Bowel Habits, Chronic diarrhea, Constipation, Difficulty Swallowing, Excessive gas, Gets full quickly at meals, Hemorrhoids, Indigestion, Nausea, Rectal Pain and Vomiting. Female Genitourinary Not Present- Frequency, Nocturia, Painful Urination, Pelvic Pain and Urgency. Musculoskeletal Not Present- Back Pain, Joint Pain, Joint Stiffness, Muscle Pain, Muscle Weakness and Swelling of Extremities. Neurological Not Present- Decreased Memory, Fainting, Headaches, Numbness, Seizures, Tingling, Tremor, Trouble walking and Weakness. Psychiatric Not Present- Anxiety, Bipolar, Change in Sleep Pattern, Depression, Fearful and Frequent crying. Endocrine Not Present- Cold Intolerance, Excessive Hunger, Hair Changes, Heat Intolerance, Hot flashes and New Diabetes. Hematology Not Present- Blood Thinners, Easy Bruising,  Excessive bleeding, Gland problems, HIV and Persistent Infections. All other systems negative  Vitals Ferd Glassing RN; 03/28/2017 11:40 AM) 03/28/2017 11:39 AM Weight: 245 lb Height: 66in Body Surface Area: 2.18 m Body Mass Index: 39.54 kg/m  Temp.: 98.48F(Oral)  Pulse: 95 (  Regular)  P.OX: 97% (Room air) BP: 146/86 (Sitting, Left Arm, Standard)       Physical Exam Axel Filler(Asher Babilonia MD; 03/28/2017 11:50 AM) The physical exam findings are as follows: Note:Constitutional: No acute distress, conversant, appears stated age  Eyes: Anicteric sclerae, moist conjunctiva, no lid lag  Neck: No thyromegaly, trachea midline, no cervical lymphadenopathy  Lungs: Clear to auscultation biilaterally, normal respiratory effot  Cardiovascular: regular rate & rhythm, no murmurs, no peripheal edema, pedal pulses 2+  GI: Soft, no masses or hepatosplenomegaly, non-tender to palpation  MSK: Normal gait, no clubbing cyanosis, edema  Skin: No rashes, palpation reveals normal skin turgor  Psychiatric: Appropriate judgment and insight, oriented to person, place, and time    Assessment & Plan Axel Filler(Sandara Tyree MD; 03/28/2017 11:50 AM) SYMPTOMATIC CHOLELITHIASIS (K80.20) Impression: 37 year old female with symptomatic cholelithiasis  1. We will proceed to the operating room for a laparoscopic cholecystectomy  2. Risks and benefits were discussed with the patient to generally include, but not limited to: infection, bleeding, possible need for post op ERCP, damage to the bile ducts, bile leak, and possible need for further surgery. Alternatives were offered and described. All questions were answered and the patient voiced understanding of the procedure and wishes to proceed at this point with a laparoscopic cholecystectomy

## 2017-03-28 NOTE — H&P (View-Only) (Signed)
History of Present Illness Cynthia Filler MD; 03/28/2017 11:50 AM) The patient is a 37 year old female who presents for evaluation of gall stones. Referred by: Dr. Durward Parcel Chief Complaint: Abdominal pain  Patient is a 37 year old female with a 2-3 month history of epigastric/right upper quadrant abdominal pain. Patient states that she recently was at a [employer] poisoning. She states that after she had some right upper quadrant abdominal pain since it was diarrhea. She states that since that time she's noticed some symptoms previous to that episode associated with fatty foods that included nausea, epigastric abdominal pain, right upper quadrant abdominal pain.  Patient with ultrasound revealed gallstones have no signs of cholecystitis Patient's LFTs were within normal limits.    Past Surgical History Ferd Glassing, RN; 03/28/2017 11:34 AM) Cesarean Section - 1  Oral Surgery   Diagnostic Studies History Ferd Glassing, RN; 03/28/2017 11:34 AM) Colonoscopy  never Mammogram  never Pap Smear  1-5 years ago  Allergies Ferd Glassing, RN; 03/28/2017 11:36 AM) Iodinated Contrast Media   Medication History Ferd Glassing, RN; 03/28/2017 11:39 AM) Singulair (4MG  Tablet Chewable, Oral) Active. Colace (100MG  Capsule, Oral) Active. Ibuprofen (200MG  Capsule, Oral) Active. Albuterol Sulfate (0.63MG /3ML Nebulized Soln, Inhalation) Active. Budesonide (1MG /2ML Suspension, Inhalation) Active. ZyrTEC Allergy (10MG  Capsule, Oral) Active. Pepcid (10MG /ML Solution, Intravenous) Active. Medications Reconciled  Social History Ferd Glassing, RN; 03/28/2017 11:34 AM) Alcohol use  Occasional alcohol use. Caffeine use  Coffee, Tea. No drug use  Tobacco use  Former smoker.  Family History Ferd Glassing, RN; 03/28/2017 11:34 AM) Arthritis  Father, Mother. Depression  Mother. Diabetes Mellitus  Father, Mother. Kidney Disease  Mother. Respiratory Condition  Father,  Mother.  Pregnancy / Birth History Ferd Glassing, RN; 03/28/2017 11:34 AM) Age at menarche  11 years. Gravida  6 Length (months) of breastfeeding  3-6 Maternal age  67-25 Para  3 Regular periods   Other Problems Ferd Glassing, RN; 03/28/2017 11:34 AM) Asthma     Review of Systems Cynthia Filler MD; 03/28/2017 11:49 AM) General Not Present- Appetite Loss, Chills, Fatigue, Fever, Night Sweats, Weight Gain and Weight Loss. Skin Not Present- Change in Wart/Mole, Dryness, Hives, Jaundice, New Lesions, Non-Healing Wounds, Rash and Ulcer. HEENT Not Present- Earache, Hearing Loss, Hoarseness, Nose Bleed, Oral Ulcers, Ringing in the Ears, Seasonal Allergies, Sinus Pain, Sore Throat, Visual Disturbances, Wears glasses/contact lenses and Yellow Eyes. Respiratory Not Present- Bloody sputum, Chronic Cough, Difficulty Breathing, Snoring and Wheezing. Cardiovascular Not Present- Chest Pain, Difficulty Breathing Lying Down, Leg Cramps, Palpitations, Rapid Heart Rate, Shortness of Breath and Swelling of Extremities. Gastrointestinal Not Present- Abdominal Pain, Bloating, Bloody Stool, Change in Bowel Habits, Chronic diarrhea, Constipation, Difficulty Swallowing, Excessive gas, Gets full quickly at meals, Hemorrhoids, Indigestion, Nausea, Rectal Pain and Vomiting. Female Genitourinary Not Present- Frequency, Nocturia, Painful Urination, Pelvic Pain and Urgency. Musculoskeletal Not Present- Back Pain, Joint Pain, Joint Stiffness, Muscle Pain, Muscle Weakness and Swelling of Extremities. Neurological Not Present- Decreased Memory, Fainting, Headaches, Numbness, Seizures, Tingling, Tremor, Trouble walking and Weakness. Psychiatric Not Present- Anxiety, Bipolar, Change in Sleep Pattern, Depression, Fearful and Frequent crying. Endocrine Not Present- Cold Intolerance, Excessive Hunger, Hair Changes, Heat Intolerance, Hot flashes and New Diabetes. Hematology Not Present- Blood Thinners, Easy Bruising,  Excessive bleeding, Gland problems, HIV and Persistent Infections. All other systems negative  Vitals Ferd Glassing RN; 03/28/2017 11:40 AM) 03/28/2017 11:39 AM Weight: 245 lb Height: 66in Body Surface Area: 2.18 m Body Mass Index: 39.54 kg/m  Temp.: 98.48F(Oral)  Pulse: 95 (  Regular)  P.OX: 97% (Room air) BP: 146/86 (Sitting, Left Arm, Standard)       Physical Exam [name](Kursten Kruk MD; 03/28/2017 11:50 AM) The physical exam findings are as follows: Note:Constitutional: No acute distress, conversant, appears stated age  Eyes: Anicteric sclerae, moist conjunctiva, no lid lag  Neck: No thyromegaly, trachea midline, no cervical lymphadenopathy  Lungs: Clear to auscultation biilaterally, normal respiratory effot  Cardiovascular: regular rate & rhythm, no murmurs, no peripheal edema, pedal pulses 2+  GI: Soft, no masses or hepatosplenomegaly, non-tender to palpation  MSK: Normal gait, no clubbing cyanosis, edema  Skin: No rashes, palpation reveals normal skin turgor  Psychiatric: Appropriate judgment and insight, oriented to person, place, and time    Assessment & Plan [name](Terissa Haffey MD; 03/28/2017 11:50 AM) SYMPTOMATIC CHOLELITHIASIS (K80.20) Impression: 37 year old female with symptomatic cholelithiasis  1. We will proceed to the operating room for a laparoscopic cholecystectomy  2. Risks and benefits were discussed with the patient to generally include, but not limited to: infection, bleeding, possible need for post op ERCP, damage to the bile ducts, bile leak, and possible need for further surgery. Alternatives were offered and described. All questions were answered and the patient voiced understanding of the procedure and wishes to proceed at this point with a laparoscopic cholecystectomy

## 2017-04-03 NOTE — Pre-Procedure Instructions (Signed)
[name]  04/03/2017      [facility] - [place], [place] - [address] [place] [address] Phone: [phone] Fax: [phone]    Your procedure is scheduled on April 09, 2017.  Report to [facility] Admitting at 845 AM  Call this number if you have problems the morning of surgery:  [phone]   Remember:  Do not eat food or drink liquids after midnight.  Take these medicines the morning of surgery with A SIP OF WATER albuterol inhaler-if needed, symbicort inhaler (bring inhalers with you), famotdine (pepcid),  Eye drops-if needed  7 days prior to surgery STOP taking any meloxicam (mobic), Aspirin (unless otherwise instructed by your surgeon), Aleve, Naproxen, Ibuprofen, Motrin, Advil, Goody's, BC's, all herbal medications, fish oil, and all vitamins  Continue all other medications as instructed by your physician except follow the above medication instructions before surgery   Do not wear jewelry, make-up or nail polish.  Do not wear lotions, powders, or perfumes, or deodorant.  Do not shave 48 hours prior to surgery.  Men may shave face and neck.  Do not bring valuables to the hospital.  [facility] is not responsible for any belongings or valuables.  Contacts, dentures or bridgework may not be worn into surgery.  Leave your suitcase in the car.  After surgery it may be brought to your room.  For patients admitted to the hospital, discharge time will be determined by your treatment team.  Patients discharged the day of surgery will not be allowed to drive home.   [facility]- Preparing For Surgery  Before surgery, you can play an important role. Because skin is not sterile, your skin needs to be as free of germs as possible. You can reduce the number of germs on your skin by washing with CHG (chlorahexidine gluconate) Soap before surgery.  CHG is an antiseptic cleaner which kills germs and bonds with the skin  to continue killing germs even after washing.  Please do not use if you have an allergy to CHG or antibacterial soaps. If your skin becomes reddened/irritated stop using the CHG.  Do not shave (including legs and underarms) for at least 48 hours prior to first CHG shower. It is OK to shave your face.  Please follow these instructions carefully.   1. Shower the NIGHT BEFORE SURGERY and the MORNING OF SURGERY with CHG.   2. If you chose to wash your hair, wash your hair first as usual with your normal shampoo.  3. After you shampoo, rinse your hair and body thoroughly to remove the shampoo.  4. Use CHG as you would any other liquid soap. You can apply CHG directly to the skin and wash gently with a scrungie or a clean washcloth.   5. Apply the CHG Soap to your body ONLY FROM THE NECK DOWN.  Do not use on open wounds or open sores. Avoid contact with your eyes, ears, mouth and genitals (private parts). Wash Face and genitals (private parts)  with your normal soap.  6. Wash thoroughly, paying special attention to the area where your surgery will be performed.  7. Thoroughly rinse your body with warm water from the neck down.  8. DO NOT shower/wash with your normal soap after using and rinsing off the CHG Soap.  9. Pat yourself dry with a CLEAN TOWEL.  10. Wear CLEAN PAJAMAS to bed the night before surgery, wear comfortable clothes the morning  of surgery  11. Place CLEAN SHEETS on your bed the night of your first shower and DO NOT SLEEP WITH PETS.  Day of Surgery: Do not apply any deodorants/lotions. Please wear clean clothes to the hospital/surgery center.     Please read over the following fact sheets that you were given. Pain Booklet, Coughing and Deep Breathing, MRSA Information and Surgical Site Infection Prevention

## 2017-04-04 ENCOUNTER — Encounter (HOSPITAL_COMMUNITY)
Admission: RE | Admit: 2017-04-04 | Discharge: 2017-04-04 | Disposition: A | Discharge status: Home / Self Care | Payer: Medicare Other | Source: Ambulatory Visit | Attending: General Surgery | Admitting: General Surgery

## 2017-04-04 ENCOUNTER — Other Ambulatory Visit: Payer: Self-pay

## 2017-04-04 ENCOUNTER — Encounter (HOSPITAL_COMMUNITY): Payer: Self-pay

## 2017-04-04 DIAGNOSIS — Z01812 Encounter for preprocedural laboratory examination: Secondary | ICD-10-CM | POA: Diagnosis not present

## 2017-04-04 DIAGNOSIS — K8020 Calculus of gallbladder without cholecystitis without obstruction: Secondary | ICD-10-CM | POA: Diagnosis not present

## 2017-04-04 LAB — CBC
HEMATOCRIT: 35.9 % — AB (ref 36.0–46.0)
HEMOGLOBIN: 11.3 g/dL — AB (ref 12.0–15.0)
MCH: 25.2 pg — ABNORMAL LOW (ref 26.0–34.0)
MCHC: 31.5 g/dL (ref 30.0–36.0)
MCV: 80 fL (ref 78.0–100.0)
Platelets: 333 10*3/uL (ref 150–400)
RBC: 4.49 MIL/uL (ref 3.87–5.11)
RDW: 15.1 % (ref 11.5–15.5)
WBC: 7.6 10*3/uL (ref 4.0–10.5)

## 2017-04-04 MED ORDER — CHLORHEXIDINE GLUCONATE CLOTH 2 % EX PADS: 6.0000 | MEDICATED_PAD | Freq: Once | CUTANEOUS | Status: DC

## 2017-04-04 NOTE — Progress Notes (Signed)
PCP: Dr. Levert FeinsteinBrittany [name]  Cardiologist: pt denies  EKG: pt denies past year  Stress test:pt denies ever  ECHO: pt denies ever  Cardiac Cath: pt denies ever  Chest x-ray: pt denies past year, no recent respiratory infection/complications

## 2017-04-09 ENCOUNTER — Ambulatory Visit (HOSPITAL_COMMUNITY): Payer: Medicare Other | Admitting: Certified Registered"

## 2017-04-09 ENCOUNTER — Encounter (HOSPITAL_COMMUNITY)
Admission: RE | Disposition: A | Discharge status: Home / Self Care | Payer: Self-pay | Source: Ambulatory Visit | Attending: General Surgery

## 2017-04-09 ENCOUNTER — Encounter (HOSPITAL_COMMUNITY): Payer: Self-pay | Admitting: *Deleted

## 2017-04-09 ENCOUNTER — Ambulatory Visit (HOSPITAL_COMMUNITY)
Admission: RE | Admit: 2017-04-09 | Discharge: 2017-04-09 | Disposition: A | Discharge status: Home / Self Care | Payer: Medicare Other | Source: Ambulatory Visit | Attending: General Surgery | Admitting: General Surgery

## 2017-04-09 ENCOUNTER — Other Ambulatory Visit: Payer: Self-pay

## 2017-04-09 DIAGNOSIS — K8010 Calculus of gallbladder with chronic cholecystitis without obstruction: Secondary | ICD-10-CM | POA: Diagnosis not present

## 2017-04-09 DIAGNOSIS — Z87891 Personal history of nicotine dependence: Secondary | ICD-10-CM | POA: Diagnosis not present

## 2017-04-09 DIAGNOSIS — Z6839 Body mass index (BMI) 39.0-39.9, adult: Secondary | ICD-10-CM | POA: Insufficient documentation

## 2017-04-09 DIAGNOSIS — K219 Gastro-esophageal reflux disease without esophagitis: Secondary | ICD-10-CM | POA: Diagnosis not present

## 2017-04-09 DIAGNOSIS — Z79899 Other long term (current) drug therapy: Secondary | ICD-10-CM | POA: Insufficient documentation

## 2017-04-09 DIAGNOSIS — K8020 Calculus of gallbladder without cholecystitis without obstruction: Secondary | ICD-10-CM | POA: Diagnosis present

## 2017-04-09 DIAGNOSIS — Z791 Long term (current) use of non-steroidal anti-inflammatories (NSAID): Secondary | ICD-10-CM | POA: Diagnosis not present

## 2017-04-09 DIAGNOSIS — J45909 Unspecified asthma, uncomplicated: Secondary | ICD-10-CM | POA: Diagnosis not present

## 2017-04-09 DIAGNOSIS — K5909 Other constipation: Secondary | ICD-10-CM | POA: Diagnosis not present

## 2017-04-09 HISTORY — PX: CHOLECYSTECTOMY: SHX55

## 2017-04-09 LAB — POCT PREGNANCY, URINE: Preg Test, Ur: NEGATIVE

## 2017-04-09 SURGERY — LAPAROSCOPIC CHOLECYSTECTOMY
Anesthesia: General | Site: Abdomen

## 2017-04-09 MED ORDER — LACTATED RINGERS IV SOLN
INTRAVENOUS | Status: DC | PRN
  Administered 2017-04-09: 10:00:00 via INTRAVENOUS

## 2017-04-09 MED ORDER — ACETAMINOPHEN 500 MG PO TABS
1000.0000 mg | ORAL_TABLET | Freq: Once | ORAL | Status: AC
  Administered 2017-04-09: 1000 mg via ORAL

## 2017-04-09 MED ORDER — ROCURONIUM BROMIDE 100 MG/10ML IV SOLN
INTRAVENOUS | Status: DC | PRN
  Administered 2017-04-09: 50 mg via INTRAVENOUS

## 2017-04-09 MED ORDER — HYDROCODONE-ACETAMINOPHEN 7.5-325 MG PO TABS
ORAL_TABLET | ORAL | Status: DC
Start: 2017-04-09 — End: 2017-04-09
  Filled 2017-04-09: qty 1

## 2017-04-09 MED ORDER — BUPIVACAINE HCL 0.25 % IJ SOLN
INTRAMUSCULAR | Status: DC | PRN
  Administered 2017-04-09: 6 mL

## 2017-04-09 MED ORDER — BUPIVACAINE HCL (PF) 0.25 % IJ SOLN
INTRAMUSCULAR | Status: AC
  Filled 2017-04-09: qty 30

## 2017-04-09 MED ORDER — HYDROCODONE-ACETAMINOPHEN 7.5-325 MG PO TABS
1.0000 | ORAL_TABLET | Freq: Once | ORAL | Status: AC | PRN
  Administered 2017-04-09: 1 via ORAL

## 2017-04-09 MED ORDER — GABAPENTIN 300 MG PO CAPS
ORAL_CAPSULE | ORAL | Status: AC
  Administered 2017-04-09: 300 mg via ORAL
  Filled 2017-04-09: qty 1

## 2017-04-09 MED ORDER — CEFAZOLIN SODIUM-DEXTROSE 2-4 GM/100ML-% IV SOLN
2.0000 g | INTRAVENOUS | Status: AC
  Administered 2017-04-09: 2 g via INTRAVENOUS

## 2017-04-09 MED ORDER — SCOPOLAMINE 1 MG/3DAYS TD PT72
MEDICATED_PATCH | TRANSDERMAL | Status: AC
  Administered 2017-04-09: 1.5 mg via TRANSDERMAL
  Filled 2017-04-09: qty 1

## 2017-04-09 MED ORDER — FENTANYL CITRATE (PF) 250 MCG/5ML IJ SOLN
INTRAMUSCULAR | Status: AC
  Filled 2017-04-09: qty 5

## 2017-04-09 MED ORDER — SUGAMMADEX SODIUM 200 MG/2ML IV SOLN
INTRAVENOUS | Status: DC | PRN
  Administered 2017-04-09: 230 mg via INTRAVENOUS

## 2017-04-09 MED ORDER — HEMOSTATIC AGENTS (NO CHARGE) OPTIME
TOPICAL | Status: DC | PRN
  Administered 2017-04-09: 1 via TOPICAL

## 2017-04-09 MED ORDER — 0.9 % SODIUM CHLORIDE (POUR BTL) OPTIME
TOPICAL | Status: DC | PRN
  Administered 2017-04-09: 1000 mL

## 2017-04-09 MED ORDER — ONDANSETRON HCL 4 MG/2ML IJ SOLN
INTRAMUSCULAR | Status: DC | PRN
  Administered 2017-04-09: 4 mg via INTRAVENOUS

## 2017-04-09 MED ORDER — CEFAZOLIN SODIUM-DEXTROSE 2-4 GM/100ML-% IV SOLN
INTRAVENOUS | Status: AC
  Filled 2017-04-09: qty 100

## 2017-04-09 MED ORDER — MEPERIDINE HCL 50 MG/ML IJ SOLN: 6.2500 mg | INTRAMUSCULAR | Status: DC | PRN

## 2017-04-09 MED ORDER — MIDAZOLAM HCL 5 MG/5ML IJ SOLN
INTRAMUSCULAR | Status: DC | PRN
  Administered 2017-04-09: 2 mg via INTRAVENOUS

## 2017-04-09 MED ORDER — ACETAMINOPHEN 500 MG PO TABS
ORAL_TABLET | ORAL | Status: AC
  Administered 2017-04-09: 1000 mg via ORAL
  Filled 2017-04-09: qty 2

## 2017-04-09 MED ORDER — DEXAMETHASONE SODIUM PHOSPHATE 10 MG/ML IJ SOLN
INTRAMUSCULAR | Status: DC | PRN
  Administered 2017-04-09: 10 mg via INTRAVENOUS

## 2017-04-09 MED ORDER — SCOPOLAMINE 1 MG/3DAYS TD PT72
1.0000 | MEDICATED_PATCH | TRANSDERMAL | Status: DC
  Administered 2017-04-09: 1.5 mg via TRANSDERMAL

## 2017-04-09 MED ORDER — PROMETHAZINE HCL 25 MG/ML IJ SOLN: 6.2500 mg | INTRAMUSCULAR | Status: DC | PRN

## 2017-04-09 MED ORDER — CLONIDINE HCL 0.2 MG PO TABS
0.2000 mg | ORAL_TABLET | Freq: Once | ORAL | Status: AC
  Administered 2017-04-09: 0.2 mg via ORAL

## 2017-04-09 MED ORDER — PROPOFOL 10 MG/ML IV BOLUS
INTRAVENOUS | Status: DC | PRN
  Administered 2017-04-09: 160 mg via INTRAVENOUS

## 2017-04-09 MED ORDER — LACTATED RINGERS IV SOLN
INTRAVENOUS | Status: DC
  Administered 2017-04-09: 10:00:00 via INTRAVENOUS

## 2017-04-09 MED ORDER — TRAMADOL HCL 50 MG PO TABS: 50.0000 mg | ORAL_TABLET | Freq: Four times a day (QID) | ORAL | 0 refills | Status: DC | PRN

## 2017-04-09 MED ORDER — HYDROMORPHONE HCL 1 MG/ML IJ SOLN
0.2500 mg | INTRAMUSCULAR | Status: DC | PRN
  Administered 2017-04-09: 0.25 mg via INTRAVENOUS

## 2017-04-09 MED ORDER — LIDOCAINE HCL (CARDIAC) 20 MG/ML IV SOLN
INTRAVENOUS | Status: DC | PRN
  Administered 2017-04-09: 100 mg via INTRAVENOUS

## 2017-04-09 MED ORDER — CELECOXIB 200 MG PO CAPS
ORAL_CAPSULE | ORAL | Status: AC
  Administered 2017-04-09: 200 mg via ORAL
  Filled 2017-04-09: qty 1

## 2017-04-09 MED ORDER — STERILE WATER FOR IRRIGATION IR SOLN
Status: DC | PRN
  Administered 2017-04-09: 1000 mL

## 2017-04-09 MED ORDER — ACETAMINOPHEN 10 MG/ML IV SOLN: 1000.0000 mg | Freq: Once | INTRAVENOUS | Status: DC | PRN

## 2017-04-09 MED ORDER — SODIUM CHLORIDE 0.9 % IR SOLN
Status: DC | PRN
  Administered 2017-04-09: 1000 mL

## 2017-04-09 MED ORDER — PROPOFOL 10 MG/ML IV BOLUS
INTRAVENOUS | Status: AC
  Filled 2017-04-09: qty 20

## 2017-04-09 MED ORDER — FENTANYL CITRATE (PF) 100 MCG/2ML IJ SOLN
INTRAMUSCULAR | Status: DC | PRN
  Administered 2017-04-09: 150 ug via INTRAVENOUS

## 2017-04-09 MED ORDER — MIDAZOLAM HCL 2 MG/2ML IJ SOLN
INTRAMUSCULAR | Status: AC
  Filled 2017-04-09: qty 2

## 2017-04-09 MED ORDER — CLONIDINE HCL 0.2 MG PO TABS
ORAL_TABLET | ORAL | Status: AC
  Administered 2017-04-09: 0.2 mg via ORAL
  Filled 2017-04-09: qty 1

## 2017-04-09 MED ORDER — CELECOXIB 200 MG PO CAPS
200.0000 mg | ORAL_CAPSULE | ORAL | Status: AC
  Administered 2017-04-09: 200 mg via ORAL

## 2017-04-09 MED ORDER — GABAPENTIN 300 MG PO CAPS
300.0000 mg | ORAL_CAPSULE | Freq: Once | ORAL | Status: AC
  Administered 2017-04-09: 300 mg via ORAL

## 2017-04-09 MED ORDER — HYDROMORPHONE HCL 1 MG/ML IJ SOLN
INTRAMUSCULAR | Status: DC
Start: 2017-04-09 — End: 2017-04-09
  Filled 2017-04-09: qty 1

## 2017-04-09 SURGICAL SUPPLY — 46 items
ADH SKN CLS APL DERMABOND .7 (GAUZE/BANDAGES/DRESSINGS) ×1
BAG SPEC RTRVL 10 TROC 200 (ENDOMECHANICALS)
CANISTER SUCT 3000ML PPV (MISCELLANEOUS) ×3 IMPLANT
CHLORAPREP W/TINT 26ML (MISCELLANEOUS) ×3 IMPLANT
CLIP VESOLOCK MED LG 6/CT (CLIP) ×3 IMPLANT
COVER SURGICAL LIGHT HANDLE (MISCELLANEOUS) ×3 IMPLANT
COVER TRANSDUCER ULTRASND (DRAPES) ×3 IMPLANT
DERMABOND ADVANCED (GAUZE/BANDAGES/DRESSINGS) ×2
DERMABOND ADVANCED .7 DNX12 (GAUZE/BANDAGES/DRESSINGS) ×1 IMPLANT
ELECT REM PT RETURN 9FT ADLT (ELECTROSURGICAL) ×3
ELECTRODE REM PT RTRN 9FT ADLT (ELECTROSURGICAL) ×1 IMPLANT
GLOVE BIO SURGEON STRL SZ7.5 (GLOVE) ×3 IMPLANT
GLOVE BIOGEL PI IND STRL 6 (GLOVE) IMPLANT
GLOVE BIOGEL PI IND STRL 7.0 (GLOVE) IMPLANT
GLOVE BIOGEL PI IND STRL 7.5 (GLOVE) IMPLANT
GLOVE BIOGEL PI INDICATOR 6 (GLOVE) ×2
GLOVE BIOGEL PI INDICATOR 7.0 (GLOVE) ×2
GLOVE BIOGEL PI INDICATOR 7.5 (GLOVE) ×2
GLOVE SURG SS PI 6.0 STRL IVOR (GLOVE) ×2 IMPLANT
GLOVE SURG SS PI 6.5 STRL IVOR (GLOVE) ×4 IMPLANT
GLOVE SURG SS PI 7.0 STRL IVOR (GLOVE) ×2 IMPLANT
GOWN STRL REUS W/ TWL LRG LVL3 (GOWN DISPOSABLE) ×2 IMPLANT
GOWN STRL REUS W/ TWL XL LVL3 (GOWN DISPOSABLE) ×1 IMPLANT
GOWN STRL REUS W/TWL LRG LVL3 (GOWN DISPOSABLE) ×6
GOWN STRL REUS W/TWL XL LVL3 (GOWN DISPOSABLE) ×6
GRASPER SUT TROCAR 14GX15 (MISCELLANEOUS) ×3 IMPLANT
HEMOSTAT SNOW SURGICEL 2X4 (HEMOSTASIS) ×2 IMPLANT
KIT BASIN OR (CUSTOM PROCEDURE TRAY) ×3 IMPLANT
KIT TURNOVER KIT B (KITS) ×2 IMPLANT
NDL INSUFFLATION 14GA 120MM (NEEDLE) ×1 IMPLANT
NEEDLE INSUFFLATION 14GA 120MM (NEEDLE) ×3 IMPLANT
NS IRRIG 1000ML POUR BTL (IV SOLUTION) ×3 IMPLANT
PAD ARMBOARD 7.5X6 YLW CONV (MISCELLANEOUS) ×3 IMPLANT
POUCH RETRIEVAL ECOSAC 10 (ENDOMECHANICALS) IMPLANT
POUCH RETRIEVAL ECOSAC 10MM (ENDOMECHANICALS)
SCISSORS LAP 5X35 DISP (ENDOMECHANICALS) ×3 IMPLANT
SET IRRIG TUBING LAPAROSCOPIC (IRRIGATION / IRRIGATOR) ×3 IMPLANT
SLEEVE ENDOPATH XCEL 5M (ENDOMECHANICALS) ×5 IMPLANT
SPECIMEN JAR SMALL (MISCELLANEOUS) ×3 IMPLANT
SUT MNCRL AB 4-0 PS2 18 (SUTURE) ×3 IMPLANT
TOWEL GREEN STERILE (TOWEL DISPOSABLE) ×2 IMPLANT
TRAY LAPAROSCOPIC MC (CUSTOM PROCEDURE TRAY) ×3 IMPLANT
TROCAR XCEL NON-BLD 11X100MML (ENDOMECHANICALS) ×3 IMPLANT
TROCAR XCEL NON-BLD 5MMX100MML (ENDOMECHANICALS) ×3 IMPLANT
TUBING INSUFFLATION (TUBING) ×3 IMPLANT
WATER STERILE IRR 1000ML POUR (IV SOLUTION) ×3 IMPLANT

## 2017-04-09 NOTE — Progress Notes (Signed)
0.75 mg dilaudid wasted in sharps container. Witnessed by Sharlot Gowdaonda Hunt, RN.

## 2017-04-09 NOTE — Anesthesia Postprocedure Evaluation (Signed)
Anesthesia Post Note  Patient: [name]  Procedure(s) Performed: LAPAROSCOPIC CHOLECYSTECTOMY (N/A Abdomen)     Patient location during evaluation: PACU Anesthesia Type: General Level of consciousness: awake and alert Pain management: pain level controlled Vital Signs Assessment: post-procedure vital signs reviewed and stable Respiratory status: spontaneous breathing, nonlabored ventilation, respiratory function stable and patient connected to nasal cannula oxygen Cardiovascular status: blood pressure returned to baseline and stable Postop Assessment: no apparent nausea or vomiting Anesthetic complications: no    Last Vitals:  Vitals:   04/09/17 1315 04/09/17 1323  BP: 136/85 (!) 142/86  Pulse: 73 76  Resp: 19   Temp: 36.7 C   SpO2: 98% 99%    Last Pain:  Vitals:   04/09/17 1315  TempSrc:   PainSc: 8                  Trevor IhaStephen A Crista Nuon

## 2017-04-09 NOTE — Anesthesia Preprocedure Evaluation (Addendum)
Anesthesia Evaluation  Patient identified by MRN, date of birth, ID band Patient awake    Reviewed: Allergy & Precautions, NPO status , Patient's Chart, lab work & pertinent test results  Airway Mallampati: II  TM Distance: >3 FB Neck ROM: Full    Dental no notable dental hx.    Pulmonary asthma , former smoker,    Pulmonary exam normal breath sounds clear to auscultation       Cardiovascular Exercise Tolerance: Good negative cardio ROS Normal cardiovascular exam Rhythm:Regular Rate:Normal     Neuro/Psych negative neurological ROS     GI/Hepatic Neg liver ROS,   Endo/Other  Morbid obesity  Renal/GU      Musculoskeletal   Abdominal (+) + obese,   Peds  Hematology negative hematology ROS (+)   Anesthesia Other Findings   Reproductive/Obstetrics negative OB ROS                            Lab Results  Component Value Date   WBC 7.6 04/04/2017   HGB 11.3 (L) 04/04/2017   HCT 35.9 (L) 04/04/2017   MCV 80.0 04/04/2017   PLT 333 04/04/2017    Anesthesia Physical Anesthesia Plan  ASA: III  Anesthesia Plan: General   Post-op Pain Management:    Induction: Intravenous  PONV Risk Score and Plan: 4 or greater and Treatment may vary due to age or medical condition, Scopolamine patch - Pre-op and Ondansetron  Airway Management Planned: Oral ETT  Additional Equipment:   Intra-op Plan:   Post-operative Plan: Extubation in OR  Informed Consent: I have reviewed the patients History and Physical, chart, labs and discussed the procedure including the risks, benefits and alternatives for the proposed anesthesia with the patient or authorized representative who has indicated his/her understanding and acceptance.   Dental advisory given  Plan Discussed with: CRNA  Anesthesia Plan Comments:        Anesthesia Quick Evaluation

## 2017-04-09 NOTE — Op Note (Signed)
04/09/2017  11:31 AM  PATIENT:  [name]  37 y.o. female  PRE-OPERATIVE DIAGNOSIS:  CHRONIC CHOLECYSTITIS, CHOLELITHIASIS   POST-OPERATIVE DIAGNOSIS:  CHRONIC CHOLECYSTITIS  CHOLELITHIASIS   PROCEDURE:  Procedure(s): LAPAROSCOPIC CHOLECYSTECTOMY (N/A)  SURGEON:  Surgeon(s) and Role:    * Axel Filleramirez, Enedelia Martorelli, MD - Primary  ANESTHESIA:   local and general  EBL:  minimal   BLOOD ADMINISTERED:none  DRAINS: none   LOCAL MEDICATIONS USED:  BUPIVICAINE   SPECIMEN:  Source of Specimen:  gallbladder  DISPOSITION OF SPECIMEN:  PATHOLOGY  COUNTS:  YES  TOURNIQUET:  * No tourniquets in log *  DICTATION: .Dragon Dictation The patient was taken to the operating and placed in the supine position with bilateral SCDs in place. The patient was prepped and draped in the usual sterile fashion. A time out was called and all facts were verified. A pneumoperitoneum was obtained via A Veress needle technique to a pressure of 14mm of mercury.  A 5mm trochar was then placed in the right upper quadrant under visualization, and there were no injuries to any abdominal organs. A 11 mm port was then placed in the umbilical region after infiltrating with local anesthesia under direct visualization. A second and third epigastric port and right lower quadrant port placement under direct visualization, respectively.   The gallbladder was identified and seen to be very inflamed.  It was grasped and retracted, the peritoneum was then sharply dissected from the gallbladder and this dissection was carried down to Calot's triangle. The gallbladder was identified and stripped away circumferentially and seen going into the gallbladder 360, the critical angle was obtained.  2 clips were placed proximally one distally and the cystic duct transected. The cystic artery was identified and 2 clips placed proximally and one distally and transected. We then proceeded to remove the gallbladder off the hepatic fossa with  Bovie cautery. A retrieval bag was then placed in the abdomen and gallbladder placed in the bag. The hepatic fossa was then reexamined and hemostasis was achieved with Bovie cautery and was excellent at the end of the case. The subhepatic fossa and perihepatic fossa was then irrigated until the effluent was clear.  The gallbladder and bag were removed from the abdominal cavity. There was some spillage of stones within the umbilcus. The 11 mm trocar fascia was reapproximated with the Endo Close #1 Vicryl x2. The pneumoperitoneum was evacuated and all trochars removed under direct visulalization. The skin was then closed with 4-0 Monocryl and the skin dressed with Steri-Strips, gauze, and tape. The patient was awaken from general anesthesia and taken to the recovery room in stable condition.   PLAN OF CARE: Discharge to home after PACU  PATIENT DISPOSITION:  PACU - hemodynamically stable.   Delay start of Pharmacological VTE agent (>24hrs) due to surgical blood loss or risk of bleeding: not applicable

## 2017-04-09 NOTE — Anesthesia Procedure Notes (Signed)
Procedure Name: Intubation Date/Time: 04/09/2017 10:31 AM Performed by: Lanell MatarBaker, Kyle Luppino M, CRNA Pre-anesthesia Checklist: Patient identified, Emergency Drugs available, Suction available and Patient being monitored Patient Re-evaluated:Patient Re-evaluated prior to induction Oxygen Delivery Method: Circle System Utilized Preoxygenation: Pre-oxygenation with 100% oxygen Induction Type: IV induction Ventilation: Mask ventilation without difficulty Laryngoscope Size: Miller and 2 Grade View: Grade I Tube type: Oral Tube size: 7.0 mm Number of attempts: 1 Airway Equipment and Method: Stylet and Oral airway Placement Confirmation: ETT inserted through vocal cords under direct vision,  positive ETCO2 and breath sounds checked- equal and bilateral Secured at: 22 cm Tube secured with: Tape Dental Injury: Teeth and Oropharynx as per pre-operative assessment

## 2017-04-09 NOTE — Transfer of Care (Signed)
Immediate Anesthesia Transfer of Care Note  Patient: [name]  Procedure(s) Performed: LAPAROSCOPIC CHOLECYSTECTOMY (N/A Abdomen)  Patient Location: PACU  Anesthesia Type:General  Level of Consciousness: awake, sedated and patient cooperative  Airway & Oxygen Therapy: Patient Spontanous Breathing and Patient connected to nasal cannula oxygen  Post-op Assessment: Report given to RN, Post -op Vital signs reviewed and stable and Patient moving all extremities X 4  Post vital signs: Reviewed and stable  Last Vitals:  Vitals Value Taken Time  BP 150/85 04/09/2017 11:45 AM  Temp    Pulse 71 04/09/2017 11:46 AM  Resp 18 04/09/2017 11:46 AM  SpO2 99 % 04/09/2017 11:46 AM  Vitals shown include unvalidated device data.  Last Pain:  Vitals:   04/09/17 0942  TempSrc:   PainSc: 0-No pain         Complications: No apparent anesthesia complications

## 2017-04-09 NOTE — Interval H&P Note (Signed)
History and Physical Interval Note:  04/09/2017 9:56 AM  [name]  has presented today for surgery, with the diagnosis of GALLSTONES  The various methods of treatment have been discussed with the patient and family. After consideration of risks, benefits and other options for treatment, the patient has consented to  Procedure(s): LAPAROSCOPIC CHOLECYSTECTOMY (N/A) as a surgical intervention .  The patient's history has been reviewed, patient examined, no change in status, stable for surgery.  I have reviewed the patient's chart and labs.  Questions were answered to the patient's satisfaction.     Marigene Ehlersamirez Jr., Jed LimerickArmando

## 2017-04-09 NOTE — Discharge Instructions (Signed)
[facility] ______[facility] °LAPAROSCOPIC SURGERY: POST OP INSTRUCTIONS °Always review your discharge instruction sheet given to you by the facility where your surgery was performed. °IF YOU HAVE DISABILITY OR FAMILY LEAVE FORMS, YOU MUST BRING THEM TO THE OFFICE FOR PROCESSING.   °DO NOT GIVE THEM TO YOUR DOCTOR. ° °1. A prescription for pain medication may be given to you upon discharge.  Take your pain medication as prescribed, if needed.  If narcotic pain medicine is not needed, then you may take acetaminophen (Tylenol) or ibuprofen (Advil) as needed. °2. Take your usually prescribed medications unless otherwise directed. °3. If you need a refill on your pain medication, please contact your pharmacy.  They will contact our office to request authorization. Prescriptions will not be filled after 5pm or on week-ends. °4. You should follow a light diet the first few days after arrival home, such as soup and crackers, etc.  Be sure to include lots of fluids daily. °5. Most patients will experience some swelling and bruising in the area of the incisions.  Ice packs will help.  Swelling and bruising can take several days to resolve.  °6. It is common to experience some constipation if taking pain medication after surgery.  Increasing fluid intake and taking a stool softener (such as Colace) will usually help or prevent this problem from occurring.  A mild laxative (Milk of Magnesia or Miralax) should be taken according to package instructions if there are no bowel movements after 48 hours. °7. Unless discharge instructions indicate otherwise, you may remove your bandages 24-48 hours after surgery, and you may shower at that time.  You may have steri-strips (small skin tapes) in place directly over the incision.  These strips should be left on the skin for 7-10 days.  If your surgeon used skin glue on the incision, you may shower in 24 hours.  The glue will flake off over the next 2-3 weeks.  Any sutures or  staples will be removed at the office during your follow-up visit. °8. ACTIVITIES:  You may resume regular (light) daily activities beginning the next day--such as daily self-care, walking, climbing stairs--gradually increasing activities as tolerated.  You may have sexual intercourse when it is comfortable.  Refrain from any heavy lifting or straining until approved by your doctor. °a. You may drive when you are no longer taking prescription pain medication, you can comfortably wear a seatbelt, and you can safely maneuver your car and apply brakes. °b. RETURN TO WORK:  __________________________________________________________ °9. You should see your doctor in the office for a follow-up appointment approximately 2-3 weeks after your surgery.  Make sure that you call for this appointment within a day or two after you arrive home to insure a convenient appointment time. °10. OTHER INSTRUCTIONS: __________________________________________________________________________________________________________________________ __________________________________________________________________________________________________________________________ °WHEN TO CALL YOUR DOCTOR: °1. Fever over 101.0 °2. Inability to urinate °3. Continued bleeding from incision. °4. Increased pain, redness, or drainage from the incision. °5. Increasing abdominal pain ° °The clinic staff is available to answer your questions during regular business hours.  Please don’t hesitate to call and ask to speak to one of the nurses for clinical concerns.  If you have a medical emergency, go to the nearest emergency room or call 911.  A surgeon from [facility] is always on call at the hospital. °[address], [address], [place], [place]  [address] ? [address] [address], [place], [place]   [address] °[phone] ? [phone] ? FAX [phone] °Web site:   [email or web address] °

## 2017-04-10 ENCOUNTER — Encounter (HOSPITAL_COMMUNITY): Payer: Self-pay | Admitting: General Surgery

## 2017-04-15 ENCOUNTER — Other Ambulatory Visit (HOSPITAL_COMMUNITY)
Admission: RE | Admit: 2017-04-15 | Discharge: 2017-04-15 | Disposition: A | Discharge status: Home / Self Care | Payer: Medicare Other | Source: Ambulatory Visit | Attending: Family Medicine | Admitting: Family Medicine

## 2017-04-15 ENCOUNTER — Ambulatory Visit (INDEPENDENT_AMBULATORY_CARE_PROVIDER_SITE_OTHER): Payer: Medicare Other | Admitting: Internal Medicine

## 2017-04-15 ENCOUNTER — Other Ambulatory Visit: Payer: Self-pay

## 2017-04-15 ENCOUNTER — Encounter: Payer: Self-pay | Admitting: Internal Medicine

## 2017-04-15 VITALS — BP 134/90 | HR 85 | Temp 98.5°F | Wt 245.0 lb

## 2017-04-15 DIAGNOSIS — B3731 Acute candidiasis of vulva and vagina: Secondary | ICD-10-CM | POA: Insufficient documentation

## 2017-04-15 DIAGNOSIS — B373 Candidiasis of vulva and vagina: Secondary | ICD-10-CM | POA: Insufficient documentation

## 2017-04-15 MED ORDER — FLUCONAZOLE 150 MG PO TABS: 150.0000 mg | ORAL_TABLET | Freq: Once | ORAL | 0 refills | Status: AC

## 2017-04-15 NOTE — Progress Notes (Signed)
   [facility] Phone: [phone]  Subjective:  [name] is a 37 year old female presenting to clinic with vaginal discharge for the last 3 days. The discharge is thick and white. She has a "burning sensation" of the skin around her genital area. No vaginal itchiness. She noticed a burning with sexual intercourse. She is sexually active with one female partner. She tried putting vaseline on the vaginal area, which helped the burning sensation. Of note, she just had gallbladder surgery 6 days ago. She was also on Amoxicillin for a couple weeks before that due to an infected tooth.  ROS: See HPI for pertinent positives and negatives  Past Medical History- migraines, moderate persistent asthma, allergic rhinitis, morbid obesity  Family history reviewed for today's visit. No changes.  Social history- patient is a former smoker  Objective: BP 134/90   Pulse 85   Temp 98.5 F (36.9 C) (Oral)   Wt 245 lb (111.1 kg)   LMP 04/01/2017   SpO2 99%   BMI 39.54 kg/m  Gen: NAD, alert, cooperative with exam GU: External genitalia normal in appearance, vaginal walls normal, moderate amount of thick white discharge present, cervix normal in appearance, no cervical motion tenderness.  Assessment/Plan: Vaginal Candidiasis: Patient with vaginal burning and thick white vaginal discharge consistent with a yeast infection. Swab for wet prep was dropped on the floor en route to the lab, so wet prep not performed in our lab. - Based on symptoms, will treat empirically for yeast infection with Diflucan 150mg  x 1, repeat in 3 days if symptoms persist - Gonorrhea/chlamydia ordered. Will add on yeast, BV, and trichomonas. - Will call patient with lab results.   Cynthia CarolKaty Zhavia Cunanan, MD PGY-3

## 2017-04-15 NOTE — Patient Instructions (Signed)
It was so nice to meet you!  You have a yeast infection. I have sent in a prescription for Diflucan. Please take this once. If you are still having symptoms after 3 days, you can take the second tablet.  I will call you with the results of your other labs.  -Dr. Nancy MarusMayo

## 2017-04-15 NOTE — Assessment & Plan Note (Signed)
Patient with vaginal burning and thick white vaginal discharge consistent with a yeast infection. Swab for wet prep was dropped on the floor en route to the lab, so wet prep not performed in our lab. - Based on symptoms, will treat empirically for yeast infection with Diflucan 150mg  x 1, repeat in 3 days if symptoms persist - Gonorrhea/chlamydia ordered. Will add on yeast, BV, and trichomonas. - Will call patient with lab results.

## 2017-04-16 LAB — CERVICOVAGINAL ANCILLARY ONLY
Bacterial vaginitis: NEGATIVE
CANDIDA VAGINITIS: POSITIVE — AB
CHLAMYDIA, DNA PROBE: NEGATIVE
Neisseria Gonorrhea: NEGATIVE
Trichomonas: NEGATIVE

## 2017-04-17 ENCOUNTER — Telehealth: Payer: Self-pay | Admitting: Internal Medicine

## 2017-04-17 NOTE — Telephone Encounter (Signed)
Called patient to discuss her lab results. Labs came back positive for yeast only. Her symptoms have already resolved after taking 1 tablet of the Diflucan. She voiced understanding.  Willadean CarolKaty Ramsay Bognar, MD PGY-3

## 2017-05-07 ENCOUNTER — Telehealth: Payer: Self-pay | Admitting: Family Medicine

## 2017-05-07 NOTE — Telephone Encounter (Signed)
Pt called and needs documentation for schooling to prove that she is disabled and this is why she has not been employed. She said she would just need proof of her diagnosis. Pt does not know if she would need to make an appointment for if Dr Pollie MeyerMcIntyre would just be able to write a letter for her to pick up. Please contact pt.

## 2017-05-08 NOTE — Telephone Encounter (Signed)
Pt schedule for appt. Deseree [name], CMA

## 2017-05-08 NOTE — Telephone Encounter (Signed)
She should schedule an appointment to discuss this. Thanks! Latrelle DodrillBrittany J Miela Desjardin, MD

## 2017-05-13 ENCOUNTER — Other Ambulatory Visit: Payer: Self-pay

## 2017-05-13 ENCOUNTER — Encounter: Payer: Self-pay | Admitting: Internal Medicine

## 2017-05-13 ENCOUNTER — Ambulatory Visit (INDEPENDENT_AMBULATORY_CARE_PROVIDER_SITE_OTHER): Payer: Medicare Other | Admitting: Internal Medicine

## 2017-05-13 VITALS — BP 136/80 | HR 71 | Temp 98.4°F | Ht 66.0 in | Wt 244.8 lb

## 2017-05-13 DIAGNOSIS — M25562 Pain in left knee: Secondary | ICD-10-CM

## 2017-05-13 DIAGNOSIS — M25561 Pain in right knee: Secondary | ICD-10-CM
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
## 2017-05-13 NOTE — Patient Instructions (Signed)
It was nice meeting you today Ms. Oblinger!  For your knee pain, take 1-2 ibuprofen tablets every 6 hours on a regular schedule over the next 3-5 days. After that, you can take ibuprofen as needed for your knee pain. You can also use a heating pad or ice pack on your knees as needed for pain relief.   If you have any questions or concerns, please feel free to call the clinic.   Be well,  Dr. Natale Milch

## 2017-06-13 DIAGNOSIS — I10 Essential (primary) hypertension: Secondary | ICD-10-CM | POA: Diagnosis not present

## 2017-06-19 ENCOUNTER — Ambulatory Visit: Payer: Medicare Other | Admitting: Family Medicine

## 2017-06-19 NOTE — Progress Notes (Deleted)
   Subjective:   Patient ID: [name]    DOB: [birth year], 37 y.o. female   MRN: [id number]  [name] is a 37 y.o. female with a history of moderate persistent asthma, allergies, morbid obesity here for   Asthma - Medications: albuterol PRN, singulair, symbicort - Taking: *** - Common triggers: *** - ED visits/hospitalization in the last 6 months: *** - Current symptoms: *** - Asthma Action Plan: ***  Blood Pressure - ***  Review of Systems:  Per HPI.   PMFSH: reviewed. Smoking status reviewed. Medications reviewed.  Objective:   There were no vitals taken for this visit. Vitals and nursing note reviewed.  General: well nourished, well developed, in no acute distress with non-toxic appearance HEENT: normocephalic, atraumatic, moist mucous membranes Neck: supple, non-tender without lymphadenopathy CV: regular rate and rhythm without murmurs, rubs, or gallops, no lower extremity edema Lungs: clear to auscultation bilaterally with normal work of breathing Abdomen: soft, non-tender, non-distended, no masses or organomegaly palpable, normoactive bowel sounds Skin: warm, dry, no rashes or lesions Extremities: warm and well perfused, normal tone MSK: ROM grossly intact, strength intact, gait normal Neuro: Alert and oriented, speech normal  Assessment & Plan:   No problem-specific Assessment & Plan notes found for this encounter.  No orders of the defined types were placed in this encounter.  No orders of the defined types were placed in this encounter.   Ellwood DenseAlison Rayann Jolley, DO PGY-1, [facility] 06/19/2017 7:21 AM

## 2017-06-27 DIAGNOSIS — J4520 Mild intermittent asthma, uncomplicated: Secondary | ICD-10-CM | POA: Diagnosis not present

## 2017-06-27 DIAGNOSIS — I10 Essential (primary) hypertension: Secondary | ICD-10-CM | POA: Diagnosis not present

## 2017-06-30 NOTE — Progress Notes (Signed)
 @Patient  ID: [name], female    DOB: 1980/09/29, 37 y.o.   MRN: [id number]  Chief Complaint  Patient presents with  . Acute Visit    Pt has complaints of SOB, cough with yellow to green mucus, wheezing, and chest tightness that has been bothering her x2 weeks. Pt denies any fever.    Referring provider: Latrelle DodrillMcIntyre, Brittany J, MD  HPI: 37 yo f with asthma since age 37 on 47maint rx ever since then quit smoking 2004 and no better intially seen in pulmonary clinic by Dr Sung AmabileSimonds early 2000s [facility]/ ER with dtca asthma she attributes to allergies self referred to Pulmonary clinic 04/13/2015    Recent [facility] Encounters:   04/13/2015 1st [facility] office visit/ Wert  maint rx symbicort 160 2 bid Chief Complaint  Patient presents with  . Pulmonary Consult    Self referral Asthma- former pt, last seen 2013  while on maint on symbicort 160 2bid but two er trips both p exp to dogs while living at [employer] house but getting ready to move.  symbicort is 90 dollars/ proair is 45 and uses maybe one every 6 m and used 3 x then to ER  Off symb x 3 days  Overt HB on bcps rec Plan A = Automatic = Symbicort 160 Take 2 puffs first thing in am and then another 2 puffs about 12 hours later                                     Singulair 10 mg each day  Plan B = Backup Only use your albuterol as a rescue medication  Work on maintaining perfect inhaler technique:   Continue pepcid 20 mg at bedtime and as needed during the day GERD (REFLUX)  is an extremely common cause of respiratory symptoms just like yours , many times with no obvious heartburn at all.  Discuss with your gyn alternatives to your birth control pills which promote more reflux    06/22/2015  f/u ov/Wert re: preop asthma eval for breast reduciton   Chief Complaint  Patient presents with  . Follow-up    Pt states had to go to ED 06/14/15 with asthma flare. Breathing is now back at her normal  baseline. She uses albuterol inhaler 1 to 2 x per wk on average.   off med x 2 weeks before attack p exp to smoke from grill > Er as above  Back on symb 2 bid and singulair now Able to sleep ok on 2 pillows s noct resp c/o's No obvious day to day or daytime variability or assoc excess/ purulent sputum or mucus plugs or hemoptysis or cp or chest tightness, subjective wheeze or overt sinus or hb symptoms. No unusual exp hx or h/o childhood pna/ asthma or knowledge of premature birth. Sleeping ok without nocturnal  or early am exacerbation  of respiratory  c/o's or need for noct saba. Also denies any obvious fluctuation of symptoms with weather or environmental changes or other aggravating or alleviating factors except as outlined above     Tests:   04/13/15-spirometry-mild airway obstruction  Imaging:   Cardiac:   Labs:   Micro:   Chart Review:      07/01/17 Acute  37 year old patient presenting today for acute symptoms of shortness of breath, wheezing, cough, chest tightness, productive mucus.  Patient is reporting adherence to  her Symbicort as well as her pro-air.  Patient reporting she is needing her rescue inhaler 3-4 times a day.   Patient reporting that she is been off of Pepcid for greater than 3 months.  Patient also reporting she has not been taking her Zyrtec for last few weeks.  Patient reporting that she sometimes gets drowsy from the Zyrtec in the morning.  Patient is not using Flonase or any intranasal glucocorticoids.   Allergies  Allergen Reactions  . Iohexol Hives and Itching       . Moxifloxacin Hives and Itching    Immunization History  Administered Date(s) Administered  . Influenza Split 12/18/2010  . Influenza Whole 01/27/2010  . Pneumococcal Polysaccharide-23 03/10/1998  . Tdap 05/08/2010, 12/04/2016    Past Medical History:  Diagnosis Date  . Asthma     reservations to 2-3 timmes  a year that required ED visits/hospitalizations. Never intubated    . GERD (gastroesophageal reflux disease)   . Seasonal allergies     Tobacco History: Social History   Tobacco Use  Smoking Status Former Smoker  . Packs/day: 0.30  . Years: 0.50  . Pack years: 0.15  . Types: Cigarettes  . Last attempt to quit: 01/15/2001  . Years since quitting: 16.4  Smokeless Tobacco Never Used   Counseling given: Not Answered Continue not smoking.   Outpatient Encounter Medications as of 07/01/2017  Medication Sig  . albuterol (PROAIR HFA) 108 (90 Base) MCG/ACT inhaler INHALE TWO PUFFS INTO LUNGS EVERY 4 HOURS AS NEEDED FOR WHEEZING AND FOR SHORTNESS OF BREATH (Patient taking differently: Inhale 1-2 puffs into the lungs every 4 (four) hours as needed. INHALE TWO PUFFS INTO LUNGS EVERY 4 HOURS AS NEEDED FOR WHEEZING AND FOR SHORTNESS OF BREATH)  . amLODipine (NORVASC) 5 MG tablet Take 5 mg by mouth daily.  . budesonide-formoterol (SYMBICORT) 160-4.5 MCG/ACT inhaler Inhale 2 puffs into the lungs 2 (two) times daily.  . cetirizine (ZYRTEC) 10 MG tablet Take 1 tablet (10 mg total) by mouth daily.  . famotidine (PEPCID) 20 MG tablet Take 1 tablet (20 mg total) by mouth at bedtime as needed for heartburn or indigestion.  Marland Kitchen ibuprofen (ADVIL,MOTRIN) 200 MG tablet Take 200 mg by mouth every 6 (six) hours as needed for headache or moderate pain.   Marland Kitchen losartan (COZAAR) 25 MG tablet Take 25 mg by mouth daily.  . montelukast (SINGULAIR) 10 MG tablet TAKE 1 TABLET BY MOUTH AT BEDTIME (Patient taking differently: TAKE 10 MG BY MOUTH AT BEDTIME)  . [DISCONTINUED] famotidine (PEPCID) 20 MG tablet Take 1 tablet (20 mg total) by mouth at bedtime as needed for heartburn or indigestion.  . fluticasone (FLONASE) 50 MCG/ACT nasal spray Place 2 sprays into both nostrils 2 (two) times daily.  . phentermine 15 MG capsule Take 15 mg by mouth every morning.  . [DISCONTINUED] traMADol (ULTRAM) 50 MG tablet Take 1 tablet (50 mg total) by mouth every 6 (six) hours as needed.  . [DISCONTINUED]  trimethoprim-polymyxin b (POLYTRIM) ophthalmic solution Place 1 drop into both eyes every 4 (four) hours.   No facility-administered encounter medications on file as of 07/01/2017.      Review of Systems  Constitutional:  +fatigue     No  weight loss, night sweats,  fevers, chills HEENT:  +nasal congestion, post nasal drip, headaches, ST   No headaches,  Difficulty swallowing,  Tooth/dental problems, No sneezing, itching, ear ache CV: No chest pain,  orthopnea, PND, swelling in lower extremities, anasarca, dizziness, palpitations,  syncope  GI: No heartburn, indigestion, abdominal pain, nausea, vomiting, diarrhea, change in bowel habits, loss of appetite, bloody stools Resp: +sob with exertion, cough - yellow mucous, occasional wheezing    No coughing up of blood.  No change in color of mucus.  No chest wall deformity Skin: no rash, lesions, no skin changes. GU: no dysuria, change in color of urine, no urgency or frequency.  No flank pain, no hematuria  MS:  No joint pain or swelling.  No decreased range of motion.  No back pain. Psych:  No change in mood or affect. No depression or anxiety.  No memory loss.  Reporting: sleep walking when younger, restless sleep, snoring Denies: , sleep talking, bruxism, or nightmares.  There is no history of restless legs.  He denies sleep hallucinations, sleep paralysis, or cataplexy.   Physical Exam  BP 136/86 (BP Location: Left Arm, Patient Position: Sitting, Cuff Size: Normal)   Pulse 93   Temp 98.4 F (36.9 C) (Oral)   Ht 5\' 6"  (1.676 m)   Wt 245 lb 12.8 oz (111.5 kg)   SpO2 98%   BMI 39.67 kg/m    Wt Readings from Last 3 Encounters:  07/01/17 245 lb 12.8 oz (111.5 kg)  05/13/17 244 lb 12.8 oz (111 kg)  04/15/17 245 lb (111.1 kg)     GEN: A/Ox3; pleasant , NAD, well nourished    HEENT:  [place]/AT,  EACs-clear, TMs-wnl, NOSE-clear, THROAT-clear, mallampati I, no lesions, no postnasal drip or exudate noted, Sinus - Left frontal slightly  tender to palpation but not severe  NECK:  Supple w/ fair ROM; no JVD; normal carotid impulses w/o bruits; no thyromegaly or nodules palpated; no lymphadenopathy.    RESP:  Clear  P & A; w/o, wheezes/ rales/ or rhonchi, with good air movement. no accessory muscle use, no dullness to percussion  CARD:  RRR, no m/r/g, no peripheral edema, pulses intact, no cyanosis or clubbing.  GI:   Soft & nt; nml bowel sounds; no organomegaly or masses detected.   Musco: Warm bil, no deformities or joint swelling noted.   Neuro: alert, no focal deficits noted.    Skin: Warm, no lesions or rashes    Epworths score: 19   Lab Results:  CBC    Component Value Date/Time   WBC 7.6 04/04/2017 1022   RBC 4.49 04/04/2017 1022   HGB 11.3 (L) 04/04/2017 1022   HGB 11.2 03/04/2017 1404   HCT 35.9 (L) 04/04/2017 1022   HCT 35.0 03/04/2017 1404   PLT 333 04/04/2017 1022   PLT 393 (H) 03/04/2017 1404   MCV 80.0 04/04/2017 1022   MCV 79 03/04/2017 1404   MCH 25.2 (L) 04/04/2017 1022   MCHC 31.5 04/04/2017 1022   RDW 15.1 04/04/2017 1022   RDW 14.4 03/04/2017 1404   LYMPHSABS 1.9 03/04/2017 1404   MONOABS 0.6 03/19/2012 1503   EOSABS 0.1 03/04/2017 1404   BASOSABS 0.0 03/04/2017 1404    BMET    Component Value Date/Time   NA 139 03/04/2017 1404   K 4.3 03/04/2017 1404   CL 99 03/04/2017 1404   CO2 24 03/04/2017 1404   GLUCOSE 77 03/04/2017 1404   GLUCOSE 83 08/25/2014 1152   GLUCOSE 79 04/26/2012 0912   BUN 8 03/04/2017 1404   CREATININE 0.68 03/04/2017 1404   CREATININE 0.62 08/25/2014 1152   CALCIUM 9.6 03/04/2017 1404   GFRNONAA 114 03/04/2017 1404   GFRAA 131 03/04/2017 1404    BNP No  results found for: BNP  ProBNP No results found for: PROBNP  Imaging: No results found.   Assessment & Plan:   Pleasant 37 year old patient seen in office visit today.  Unfortunately patient has not been taking her controller therapies as far as a daily antihistamine as well as her H2  blocker for GERD.  We discussed this at length is how this can exacerbate and worsen your respiratory symptoms.  Patient is agreeable to start back on a daily antihistamine but wants to try Claritin instead of Zyrtec.  Patient also will restart Pepcid I refilled today.  Provided discharge instructions with patient on appropriate diet for GERD.  On assessment as well as with discussion with patient patient is exceptionally fatigued.  Epworth score today is 19.  We will set patient up for home sleep study.  Patient reporting that her boyfriend reports that she snores very loud.  Will also do baseline lab work of CBC as well as TSH due to the fatigue.  Follow-up with Dr. Sherene Sires in 3 months  Moderate persistent allergic asthma without complication Continue your Symbicort >>> 2 puffs in the morning right when you wake up, rinse your mouth out, 12 hours later 2 more puffs, rinse her mouth out after use >>> Continue this daily no matter what  Let us restart a daily antihistamine >>> You can do Claritin/loratadine 10 mg daily  Fluticasone nasal spray >>> 2 sprays each nostril daily as needed for nasal congestion or allergy symptoms  Follow up with Dr. Sherene Sires in 3 months    Allergic rhinitis Continue your Symbicort >>> 2 puffs in the morning right when you wake up, rinse your mouth out, 12 hours later 2 more puffs, rinse her mouth out after use >>> Continue this daily no matter what  Let us restart a daily antihistamine >>> You can do Claritin/loratadine 10 mg daily  Fluticasone nasal spray >>> 2 sprays each nostril daily as needed for nasal congestion or allergy symptoms    Morbid obesity (HCC) Continue to work towards healthy weight  Fatigue  Lab work today >>> TSH and CBC >>> Complete this in the basement of our office today  when leaving this appointment >>>if these are irregular then primary care can manage   Home Sleep Study  >>> Based off of your fatigue, discussion of your  sleep, and Epworth sleep score of 19 -we will order home sleep study today  Follow up with Dr. Sherene Sires in 3 months       Coral Ceo, NP 07/01/2017

## 2017-07-01 ENCOUNTER — Ambulatory Visit (INDEPENDENT_AMBULATORY_CARE_PROVIDER_SITE_OTHER): Payer: Medicare Other | Admitting: Pulmonary Disease

## 2017-07-01 ENCOUNTER — Other Ambulatory Visit (INDEPENDENT_AMBULATORY_CARE_PROVIDER_SITE_OTHER): Payer: Medicare Other

## 2017-07-01 ENCOUNTER — Encounter: Payer: Self-pay | Admitting: Pulmonary Disease

## 2017-07-01 VITALS — BP 136/86 | HR 93 | Temp 98.4°F | Ht 66.0 in | Wt 245.8 lb

## 2017-07-01 DIAGNOSIS — R5383 Other fatigue: Secondary | ICD-10-CM

## 2017-07-01 DIAGNOSIS — J4540 Moderate persistent asthma, uncomplicated: Secondary | ICD-10-CM

## 2017-07-01 DIAGNOSIS — J309 Allergic rhinitis, unspecified: Secondary | ICD-10-CM | POA: Diagnosis not present

## 2017-07-01 DIAGNOSIS — K219 Gastro-esophageal reflux disease without esophagitis: Secondary | ICD-10-CM

## 2017-07-01 LAB — CBC
HCT: 33.3 % — ABNORMAL LOW (ref 36.0–46.0)
HEMOGLOBIN: 11.1 g/dL — AB (ref 12.0–15.0)
MCHC: 33.2 g/dL (ref 30.0–36.0)
MCV: 77.4 fl — ABNORMAL LOW (ref 78.0–100.0)
PLATELETS: 335 10*3/uL (ref 150.0–400.0)
RBC: 4.3 Mil/uL (ref 3.87–5.11)
RDW: 15.6 % — ABNORMAL HIGH (ref 11.5–15.5)
WBC: 11.5 10*3/uL — AB (ref 4.0–10.5)

## 2017-07-01 LAB — TSH: TSH: 1.34 u[IU]/mL (ref 0.35–4.50)

## 2017-07-01 MED ORDER — FAMOTIDINE 20 MG PO TABS: 20.0000 mg | ORAL_TABLET | Freq: Every evening | ORAL | 1 refills | Status: DC | PRN

## 2017-07-01 MED ORDER — FLUTICASONE PROPIONATE 50 MCG/ACT NA SUSP: 2.0000 | Freq: Two times a day (BID) | NASAL | 3 refills | Status: DC

## 2017-07-01 NOTE — Assessment & Plan Note (Signed)
Continue your Symbicort >>> 2 puffs in the morning right when you wake up, rinse your mouth out, 12 hours later 2 more puffs, rinse her mouth out after use >>> Continue this daily no matter what  Let us restart a daily antihistamine >>> You can do Claritin/loratadine 10 mg daily  Fluticasone nasal spray >>> 2 sprays each nostril daily as needed for nasal congestion or allergy symptoms  Follow up with Dr. Sherene SiresWert in 3 months

## 2017-07-01 NOTE — Progress Notes (Signed)
Chart and office note reviewed in detail  > agree with a/p as outlined    

## 2017-07-01 NOTE — Assessment & Plan Note (Signed)
Continue to work towards healthy weight 

## 2017-07-01 NOTE — Patient Instructions (Addendum)
Continue your Symbicort >>> 2 puffs in the morning right when you wake up, rinse your mouth out, 12 hours later 2 more puffs, rinse her mouth out after use >>> Continue this daily no matter what  Let [name] restart a daily antihistamine >>> You can do Claritin/loratadine 10 mg daily  Fluticasone nasal spray >>> 2 sprays each nostril daily as needed for nasal congestion or allergy symptoms  Restart Pepcid 20 mg >>> Take at night  >>> Reviewed literature and discharge paperwork below about appropriate diet for acid reflux management >>> Continue working towards a healthy weight   Lab work today >>> TSH and CBC >>> Complete this in the basement of our office today  when leaving this appointment >>>if these are irregular then primary care can manage   Home Sleep Study  >>> Based off of your fatigue, discussion of your sleep, and Epworth sleep score of 19 -we will order home sleep study today  Follow up with Dr. Sherene SiresWert in 3 months      Please contact the office if your symptoms worsen or you have concerns that you are not improving.   Thank you for choosing [facility] for your healthcare, and for allowing [name] to partner with you on your healthcare journey. I am thankful to be able to provide care to you today.   Elisha HeadlandBrian Arnez Stoneking FNP-C   Gastroesophageal Reflux Disease, Adult Normally, food travels down the esophagus and stays in the stomach to be digested. If a person has gastroesophageal reflux disease (GERD), food and stomach acid move back up into the esophagus. When this happens, the esophagus becomes sore and swollen (inflamed). Over time, GERD can make small holes (ulcers) in the lining of the esophagus. Follow these instructions at home: Diet  Follow a diet as told by your doctor. You may need to avoid foods and drinks such as: ? Coffee and tea (with or without caffeine). ? Drinks that contain alcohol. ? Energy drinks and sports drinks. ? Carbonated drinks or  sodas. ? Chocolate and cocoa. ? Peppermint and mint flavorings. ? Garlic and onions. ? Horseradish. ? Spicy and acidic foods, such as peppers, chili powder, curry powder, vinegar, hot sauces, and BBQ sauce. ? Citrus fruit juices and citrus fruits, such as oranges, lemons, and limes. ? Tomato-based foods, such as red sauce, chili, salsa, and pizza with red sauce. ? Fried and fatty foods, such as donuts, french fries, potato chips, and high-fat dressings. ? High-fat meats, such as hot dogs, rib eye steak, sausage, ham, and bacon. ? High-fat dairy items, such as whole milk, butter, and cream cheese.  Eat small meals often. Avoid eating large meals.  Avoid drinking large amounts of liquid with your meals.  Avoid eating meals during the 2-3 hours before bedtime.  Avoid lying down right after you eat.  Do not exercise right after you eat. General instructions  Pay attention to any changes in your symptoms.  Take over-the-counter and prescription medicines only as told by your doctor. Do not take aspirin, ibuprofen, or other NSAIDs unless your doctor says it is okay.  Do not use any tobacco products, including cigarettes, chewing tobacco, and e-cigarettes. If you need help quitting, ask your doctor.  Wear loose clothes. Do not wear anything tight around your waist.  Raise (elevate) the head of your bed about 6 inches (15 cm).  Try to lower your stress. If you need help doing this, ask your doctor.  If you are overweight, lose an amount  of weight that is healthy for you. Ask your doctor about a safe weight loss goal.  Keep all follow-up visits as told by your doctor. This is important. Contact a doctor if:  You have new symptoms.  You lose weight and you do not know why it is happening.  You have trouble swallowing, or it hurts to swallow.  You have wheezing or a cough that keeps happening.  Your symptoms do not get better with treatment.  You have a hoarse voice. Get help  right away if:  You have pain in your arms, neck, jaw, teeth, or back.  You feel sweaty, dizzy, or light-headed.  You have chest pain or shortness of breath.  You throw up (vomit) and your throw up looks like blood or coffee grounds.  You pass out (faint).  Your poop (stool) is bloody or black.  You cannot swallow, drink, or eat. This information is not intended to replace advice given to you by your health care provider. Make sure you discuss any questions you have with your health care provider. Document Released: 06/20/2007 Document Revised: 06/09/2015 Document Reviewed: 04/28/2014 Elsevier Interactive Patient Education  2018 [employer].     Food Choices for Gastroesophageal Reflux Disease, Adult When you have gastroesophageal reflux disease (GERD), the foods you eat and your eating habits are very important. Choosing the right foods can help ease your discomfort. What guidelines do I need to follow?  Choose fruits, vegetables, whole grains, and low-fat dairy products.  Choose low-fat meat, fish, and poultry.  Limit fats such as oils, salad dressings, butter, nuts, and avocado.  Keep a food diary. This helps you identify foods that cause symptoms.  Avoid foods that cause symptoms. These may be different for everyone.  Eat small meals often instead of 3 large meals a day.  Eat your meals slowly, in a place where you are relaxed.  Limit fried foods.  Cook foods using methods other than frying.  Avoid drinking alcohol.  Avoid drinking large amounts of liquids with your meals.  Avoid bending over or lying down until 2-3 hours after eating. What foods are not recommended? These are some foods and drinks that may make your symptoms worse: Vegetables Tomatoes. Tomato juice. Tomato and spaghetti sauce. Chili peppers. Onion and garlic. Horseradish. Fruits Oranges, grapefruit, and lemon (fruit and juice). Meats High-fat meats, fish, and poultry. This includes hot  dogs, ribs, ham, sausage, salami, and bacon. Dairy Whole milk and chocolate milk. Sour cream. Cream. Butter. Ice cream. Cream cheese. Drinks Coffee and tea. Bubbly (carbonated) drinks or energy drinks. Condiments Hot sauce. Barbecue sauce. Sweets/Desserts Chocolate and cocoa. Donuts. Peppermint and spearmint. Fats and Oils High-fat foods. This includes [place] fries and potato chips. Other Vinegar. Strong spices. This includes black pepper, white pepper, red pepper, cayenne, curry powder, cloves, ginger, and chili powder. The items listed above may not be a complete list of foods and drinks to avoid. Contact your dietitian for more information. This information is not intended to replace advice given to you by your health care provider. Make sure you discuss any questions you have with your health care provider. Document Released: 07/03/2011 Document Revised: 06/09/2015 Document Reviewed: 11/05/2012 Elsevier Interactive Patient Education  2017 [employer].

## 2017-07-01 NOTE — Assessment & Plan Note (Addendum)
Mild AR Flare today   Continue your Symbicort >>> 2 puffs in the morning right when you wake up, rinse your mouth out, 12 hours later 2 more puffs, rinse her mouth out after use >>> Continue this daily no matter what  Let us restart a daily antihistamine >>> You can do Claritin/loratadine 10 mg daily  Fluticasone nasal spray >>> 2 sprays each nostril daily as needed for nasal congestion or allergy symptoms

## 2017-07-01 NOTE — Assessment & Plan Note (Signed)
  Lab work today >>> TSH and CBC >>> Complete this in the basement of our office today  when leaving this appointment >>>if these are irregular then primary care can manage   Home Sleep Study  >>> Based off of your fatigue, discussion of your sleep, and Epworth sleep score of 19 -we will order home sleep study today  Follow up with Dr. Sherene SiresWert in 3 months

## 2017-07-01 NOTE — Progress Notes (Signed)
Your lab results have come back.  Your TSH or thyroid-stimulating hormone level is normal.    Your blood counts though are still showing an anemia.  I would definitely your primary care office note this.  You may benefit from starting some daily iron tablets to go to raise this level back up.  And they should be monitoring and making sure that we are treating this appropriately.  There is also additional lab work that they can do to check iron stores as well as to further see if this is an iron deficiency anemia.  We will route/fax these lab results to your primary care as well.  I recommend that you call them to talk with him by your symptoms as well as the anemia.  As we discussed at our office visit today.  Elisha HeadlandBrian Mack FNP

## 2017-07-08 DIAGNOSIS — I10 Essential (primary) hypertension: Secondary | ICD-10-CM | POA: Diagnosis not present

## 2017-07-17 DIAGNOSIS — R0683 Snoring: Secondary | ICD-10-CM | POA: Diagnosis not present

## 2017-07-18 DIAGNOSIS — R0683 Snoring: Secondary | ICD-10-CM | POA: Diagnosis not present

## 2017-07-23 ENCOUNTER — Other Ambulatory Visit: Payer: Self-pay | Admitting: *Deleted

## 2017-07-23 DIAGNOSIS — R5383 Other fatigue: Secondary | ICD-10-CM

## 2017-08-01 DIAGNOSIS — I10 Essential (primary) hypertension: Secondary | ICD-10-CM | POA: Diagnosis not present

## 2017-08-13 ENCOUNTER — Telehealth: Payer: Self-pay | Admitting: Pulmonary Disease

## 2017-08-13 NOTE — Telephone Encounter (Signed)
Called and spoke with pt letting her know the results of the HST.  Pt expressed understanding. Nothing further needed.

## 2017-08-13 NOTE — Telephone Encounter (Signed)
08/05/17 - Home sleep study results-AHI 2.1.  Lowest SaO2 91%.  We do not treat for obstructive sleep apnea unless the AHI is greater than 5.  Patient does not need treatment at this time.  Elisha HeadlandBrian Jaicion Laurie, FNP

## 2017-08-13 NOTE — Telephone Encounter (Signed)
Pt called wanting to know the results of the HST that was completed 07/17/17.  Arlys JohnBrian, please advise on this for pt. Thanks!

## 2017-10-01 ENCOUNTER — Other Ambulatory Visit (INDEPENDENT_AMBULATORY_CARE_PROVIDER_SITE_OTHER): Payer: Medicare Other

## 2017-10-01 ENCOUNTER — Ambulatory Visit (INDEPENDENT_AMBULATORY_CARE_PROVIDER_SITE_OTHER): Payer: Medicare Other | Admitting: Internal Medicine

## 2017-10-01 ENCOUNTER — Encounter: Payer: Self-pay | Admitting: Internal Medicine

## 2017-10-01 VITALS — BP 134/78 | HR 74 | Ht 66.0 in | Wt 260.0 lb

## 2017-10-01 DIAGNOSIS — J4540 Moderate persistent asthma, uncomplicated: Secondary | ICD-10-CM

## 2017-10-01 LAB — CBC WITH DIFFERENTIAL/PLATELET
BASOS ABS: 0.1 10*3/uL (ref 0.0–0.1)
Basophils Relative: 0.9 % (ref 0.0–3.0)
Eosinophils Absolute: 0.1 10*3/uL (ref 0.0–0.7)
Eosinophils Relative: 1.5 % (ref 0.0–5.0)
HCT: 34.7 % — ABNORMAL LOW (ref 36.0–46.0)
Hemoglobin: 11.2 g/dL — ABNORMAL LOW (ref 12.0–15.0)
LYMPHS ABS: 2.3 10*3/uL (ref 0.7–4.0)
Lymphocytes Relative: 27.3 % (ref 12.0–46.0)
MCHC: 32.3 g/dL (ref 30.0–36.0)
MCV: 78.1 fl (ref 78.0–100.0)
Monocytes Absolute: 0.7 10*3/uL (ref 0.1–1.0)
Monocytes Relative: 8.7 % (ref 3.0–12.0)
NEUTROS ABS: 5.1 10*3/uL (ref 1.4–7.7)
NEUTROS PCT: 61.6 % (ref 43.0–77.0)
PLATELETS: 334 10*3/uL (ref 150.0–400.0)
RBC: 4.45 Mil/uL (ref 3.87–5.11)
RDW: 15.5 % (ref 11.5–15.5)
WBC: 8.3 10*3/uL (ref 4.0–10.5)

## 2017-10-01 LAB — POCT EXHALED NITRIC OXIDE: FeNO level (ppb): 47

## 2017-10-01 NOTE — Patient Instructions (Addendum)
Not change medications for now  Please remember to go to the lab department downstairs in the basement  for your tests - we will call you with the results when they are available.      Please schedule a follow up visit in 6  months but call sooner if needed

## 2017-10-01 NOTE — Progress Notes (Signed)
Subjective:    Patient ID: [name], female    DOB: 09-20-1980,   MRN: [id number]    Brief patient profile:  [age] yobf with asthma since age 37 on maint rx ever since then quit smoking 2004 and no better intially seen in pulmonary clinic by Dr Sung AmabileSimonds early 2000s [facility] ER with dtca asthma she attributes to allergies self referred to Pulmonary clinic 04/13/2015 p last ov:   02/22/2011 recs Pepcid 20 mg one at bedtime GERD diet / lifestyle    Plan A is to use the symbicort 160 Take 2 puffs first thing in am and then another 2 puffs about 12 hours later and the singulair at night If you can't catch your breath, go to Plan B= ok to use ventolin hfa if needed up to every 4 hours with the goal of less than twice weekly If you still catch your breath Plan C = use the nebulizer up to 4 hours and call us right away for appt to see me or Tammy Nurse Practioner. F/u 4 weeks > no show     04/13/2015 1st [facility] office visit/ [name]  maint rx symbicort 160 2 bid Chief Complaint  Patient presents with  . Pulmonary Consult    Self referral Asthma- former pt, last seen 2013  while on maint on symbicort 160 2bid but two er trips both p exp to dogs while living at [place] house but getting ready to move.  symbicort is 90 dollars/ proair is 45 and uses maybe one every 6 m and used 3 x then to ER  Off symb x 3 days  Overt HB on bcps rec Plan A = Automatic = Symbicort 160 Take 2 puffs first thing in am and then another 2 puffs about 12 hours later                                     Singulair 10 mg each day  Plan B = Backup Only use your albuterol as a rescue medication  Work on maintaining perfect inhaler technique:   Continue pepcid 20 mg at bedtime and as needed during the day GERD (REFLUX)  is an extremely common cause of respiratory symptoms just like yours , many times with no obvious heartburn at all.  Discuss with your gyn alternatives to your birth control  pills which promote more reflux     06/22/2015  f/u ov/[name] re: preop asthma eval for breast reduciton   Chief Complaint  Patient presents with  . Follow-up    Pt states had to go to ED 06/14/15 with asthma flare. Breathing is now back at her normal baseline. She uses albuterol inhaler 1 to 2 x per wk on average.   off med x 2 weeks before attack p exp to smoke from grill > Er as above  Back on symb 2 bid and singulair now Able to sleep ok on 2 pillows s noct resp c/o's rec  continue symb 160 2bid    07/01/17  NP eval for flare off hs h2  Continue your Symbicort >>> 2 puffs in the morning right when you wake up, rinse your mouth out, 12 hours later 2 more puffs, rinse her mouth out after use >>> Continue this daily no matter what Let us restart a daily antihistamine >>> You can do Claritin/loratadine 10 mg daily Fluticasone nasal spray >>> 2  sprays each nostril daily as needed for nasal congestion or allergy symptoms Restart Pepcid 20 mg >>> Take at night  >>> Reviewed literature and discharge paperwork below about appropriate diet for acid reflux management >>> Continue working towards a healthy weight Lab work today >>> TSH and CBC >>> Complete this in the basement of our office today  when leaving this appointment >>>if these are irregular then primary care can manage     10/01/2017  f/u ov/[name] re: asthma maint on symb 160/ singulair and noct h2  Chief Complaint  Patient presents with  . Follow-up    Breathing has returned to baseline. She rarely uses her albuterol inhaler.  She c/o swelling in her left ankle and both hands for the past few wks.    Dyspnea:  MMRC1 = can walk nl pace, flat grade, can't hurry or go uphills or steps s sob   Cough: none Sleeping: flat / 1-2 pillows  SABA use: rare 02: none     No obvious day to day or daytime variability or assoc excess/ purulent sputum or mucus plugs or hemoptysis or cp or chest tightness, subjective wheeze or overt sinus  or hb symptoms.   Sleeps as above without nocturnal  or early am exacerbation  of respiratory  c/o's or need for noct saba. Also denies any obvious fluctuation of symptoms with weather or environmental changes or other aggravating or alleviating factors except as outlined above   No unusual exposure hx or h/o childhood pna/ asthma or knowledge of premature birth.  Current Allergies, Complete Past Medical History, Past Surgical History, Family History, and Social History were reviewed in [employer] record.  ROS  The following are not active complaints unless bolded Hoarseness, sore throat, dysphagia, dental problems, itching, sneezing,  nasal congestion or discharge of excess mucus or purulent secretions, ear ache,   fever, chills, sweats, unintended wt loss or wt gain, classically pleuritic or exertional cp,  orthopnea pnd or arm/hand swelling  or leg swelling, presyncope, palpitations, abdominal pain, anorexia, nausea, vomiting, diarrhea  or change in bowel habits or change in bladder habits, change in stools or change in urine, dysuria, hematuria,  rash, arthralgias, visual complaints, headache, numbness, weakness or ataxia or problems with walking or coordination,  change in mood or  memory.        Current Meds  Medication Sig  . albuterol (PROAIR HFA) 108 (90 Base) MCG/ACT inhaler INHALE TWO PUFFS INTO LUNGS EVERY 4 HOURS AS NEEDED FOR WHEEZING AND FOR SHORTNESS OF BREATH (Patient taking differently: Inhale 1-2 puffs into the lungs every 4 (four) hours as needed. INHALE TWO PUFFS INTO LUNGS EVERY 4 HOURS AS NEEDED FOR WHEEZING AND FOR SHORTNESS OF BREATH)  . amLODipine (NORVASC) 5 MG tablet Take 5 mg by mouth daily.  . budesonide-formoterol (SYMBICORT) 160-4.5 MCG/ACT inhaler Inhale 2 puffs into the lungs 2 (two) times daily.  . cetirizine (ZYRTEC) 10 MG tablet Take 1 tablet (10 mg total) by mouth daily.  . famotidine (PEPCID) 20 MG tablet Take 1 tablet (20 mg total) by  mouth at bedtime as needed for heartburn or indigestion.  . fluticasone (FLONASE) 50 MCG/ACT nasal spray Place 2 sprays into both nostrils 2 (two) times daily.  Marland Kitchen ibuprofen (ADVIL,MOTRIN) 200 MG tablet Take 200 mg by mouth every 6 (six) hours as needed for headache or moderate pain.   Marland Kitchen losartan (COZAAR) 25 MG tablet Take 25 mg by mouth daily.  . montelukast (SINGULAIR) 10 MG tablet TAKE  1 TABLET BY MOUTH AT BEDTIME (Patient taking differently: TAKE 10 MG BY MOUTH AT BEDTIME)  . phentermine 15 MG capsule Take 15 mg by mouth every morning.                          Objective:   Physical Exam  amb obese bf / pseudowheeze only elimated with plm  10/01/2017       260  06/22/2015          245  04/13/15 243 lb 6.4 oz (110.406 kg)  04/03/15 239 lb (108.41 kg)  03/14/15 242 lb 1.6 oz (109.816 kg)     Vital signs reviewed - Note on arrival 02 sats  98% on RA       HEENT: nl dentition, turbinates bilaterally, and oropharynx. Nl external ear canals without cough reflex   NECK :  without JVD/Nodes/TM/ nl carotid upstrokes bilaterally   LUNGS: no acc muscle use,  Nl contour chest with min insp/exp rhonchi  bilaterally without cough on insp or exp maneuvers   CV:  RRR  no s3 or murmur or increase in P2, and trace ankle  edema L >R   ABD: obese  soft and nontender with nl inspiratory excursion in the supine position. No bruits or organomegaly appreciated, bowel sounds nl  MS:  Nl gait/ ext warm without deformities, calf tenderness, cyanosis or clubbing No obvious joint restrictions   SKIN: warm and dry without lesions    NEURO:  alert, approp, nl sensorium with  no motor or cerebellar deficits apparent.       Labs ordered 10/01/2017  Allergy profile   Assessment & Plan:

## 2017-10-02 ENCOUNTER — Encounter: Payer: Self-pay | Admitting: Internal Medicine

## 2017-10-02 LAB — RESPIRATORY ALLERGY PROFILE REGION II ~~LOC~~
ALLERGEN, MOUSE U PROTEIN, E72: 1.13 kU/L — AB
Allergen, A. alternata, m6: <0.1 kU/L
Allergen, Cedar tree, t12: <0.1 kU/L
Allergen, Comm Silver Birch, t9: <0.1 kU/L
Allergen, Cottonwood, t14: <0.1 kU/L
Aspergillus fumigatus, m3: <0.1 kU/L
Bermuda Grass: <0.1 kU/L
Box Elder IgE: <0.1 kU/L
CLADOSPORIUM HERBARUM (M2) IGE: <0.1 kU/L
CLASS: 0
CLASS: 0
CLASS: 0
CLASS: 0
CLASS: 0
CLASS: 0
CLASS: 0
CLASS: 2
CLASS: 2
COCKROACH: 0.94 kU/L — AB
COMMON RAGWEED (SHORT) (W1) IGE: 0.59 kU/L — ABNORMAL HIGH
Cat Dander: 4.95 kU/L — ABNORMAL HIGH
Class: 0
Class: 0
Class: 0
Class: 0
Class: 0
Class: 0
Class: 0
Class: 0
Class: 0
Class: 0
Class: 0
Class: 0
Class: 1
Class: 3
Class: 4
Dog Dander: 33.8 kU/L — ABNORMAL HIGH
Elm IgE: <0.1 kU/L
IGE (IMMUNOGLOBULIN E), SERUM: 100 kU/L (ref <=114)
Johnson Grass: <0.1 kU/L
Pecan/Hickory Tree IgE: <0.1 kU/L
Rough Pigweed  IgE: <0.1 kU/L
Sheep Sorrel IgE: <0.1 kU/L

## 2017-10-02 LAB — INTERPRETATION:

## 2017-10-02 NOTE — Assessment & Plan Note (Addendum)
-  NO 04/13/2015  = 119 3 d off symbicort  - Spirometry 04/13/2015  FEV1 2.19 (78%)  Ratio 65   - 06/22/2015  After extensive coaching HFA effectiveness =    90%  - FENO 06/22/2015  107   - FENO 10/01/2017  =   47 - Allergy profile 10/01/2017 >  Eos 0.1/  IgE pending    - 10/01/2017  After extensive coaching inhaler device,  effectiveness =    90%    All goals of chronic asthma control met including optimal function and elimination of symptoms with minimal need for rescue therapy.  Contingencies discussed in full including contacting this office immediately if not controlling the symptoms using the rule of two's.    F/u can be q 6 m sooner prn    I had an extended discussion with the patient reviewing all relevant studies completed to date and  lasting 15 to 20 minutes of a 25 minute visit    See device teaching which extended face to face time for this visit.  Each maintenance medication was reviewed in detail including emphasizing most importantly the difference between maintenance and prns and under what circumstances the prns are to be triggered using an action plan format that is not reflected in the computer generated alphabetically organized AVS which I have not found useful in most complex patients, especially with respiratory illnesses  Please see AVS for specific instructions unique to this visit that I personally wrote and verbalized to the the pt in detail and then reviewed with pt  by my nurse highlighting any  changes in therapy recommended at today's visit to their plan of care.

## 2017-10-02 NOTE — Assessment & Plan Note (Signed)
Body mass index is 41.97 kg/m.  -  trending up Lab Results  Component Value Date   TSH 1.34 07/01/2017     Contributing to gerd risk/ doe/reviewed the need and the process to achieve and maintain neg calorie balance > defer f/u primary care including intermittently monitoring thyroid status

## 2017-10-03 ENCOUNTER — Other Ambulatory Visit: Payer: Self-pay

## 2017-10-03 NOTE — Patient Outreach (Signed)
[facility] Care Management  10/03/2017  Rexene Agentnchon M Joaquin 1980/10/01 [id number]   Medication Adherence call to Mrs. Kylin Crain left a message for patient to call back patient is due on Losartan 25 mg. <rs. Haub is showing past due under [facility].  Lillia AbedAna Ollison-Moran CPhT Pharmacy Technician [facility] Care Management Direct Dial [phone]  Fax [phone] [email or web address]

## 2017-10-03 NOTE — Progress Notes (Signed)
Spoke with pt and notified of results per Dr. Wert. Pt verbalized understanding and denied any questions. 

## 2017-10-19 ENCOUNTER — Other Ambulatory Visit: Payer: Self-pay | Admitting: Family Medicine

## 2017-10-25 ENCOUNTER — Telehealth: Payer: Self-pay | Admitting: *Deleted

## 2017-10-25 ENCOUNTER — Other Ambulatory Visit: Payer: Self-pay | Admitting: Pharmacist

## 2017-10-25 NOTE — Telephone Encounter (Signed)
Gentry Fitz called to inform Dr. Pollie Meyer that pt has not been taking Losartan.  Pt was unable to give a timeframe.  Pt states she was unable to afford the co-pay to go back to cards and they would not refill her meds.   Per Gentry Fitz, pt checks BP @ home and reports 130's/80's.  Pt has appt with Dr. Manson Passey on 10/29/17.  Will forward message to her in Dr. Valorie Roosevelt absence. Giancarlo Askren, Maryjo Rochester, CMA

## 2017-10-25 NOTE — Patient Outreach (Signed)
[facility]) Care Management  10/25/2017  VITORIA CONYER Jun 20, 1980 [id number]  Medication Adherence  Outreach call to Rexene Agent Hofbauer regarding her request for follow up from the [facility] Medication Adherence Campaign. Speak with patient. HIPAA identifiers verified and verbal consent received.  Ms. Yoshino reports that she stopped taking her losartan 25 mg once daily a couple of months ago because she ran out of refills. Reports that she was previously seen by a Cardiologist and started on this medication for blood pressure control, but did not return for a follow up appointment due to concern about the cost of her specialist copayment. Reports that she has a home upper arm blood pressure monitor and that her blood pressure has been "good", around 130/80, since stopping the losartan. Counsel patient on the importance of blood pressure control and follow up with her doctor. Encourage patient to call her PCP office to schedule a follow up appointment. Patient agrees that she will as this copayment is affordable to her.  Patient reports that she is currently in need a of a Symbicort Refill. Confirm via Epic that a new Symbicort prescription has been sent into her pharmacy this week. Patient denies consistently rinsing her mouth out after use of this inhaler. Counsel patient on the importance of doing this.  Medication Assistance  Patient reports that she currently has Full Extra Help subsidy with the cost of her medications.   Coordination of Care  Explain to patient [facility] Care Management services. Patient denies need for Social Work at this time, but expresses interest in speaking with a nurse.   Patient denies any further medication questions/concerns at this time. Provide patient with my phone number.   PLAN  1) I will call to follow up with patient's PCP to let the provider know that Ms. Massenburg reports having stopped taking her losartan prescription due to not having kept  her Cardiology follow up appointment due to concerns about copayment cost.   2) Patient to call to schedule follow up appointment with her PCP.  3) Will send referral to [facility] Health Coach to follow up with patient about asthma control and health maintenance.  4) Will close pharmacy episode.  Duanne Moron, PharmD, [facility] Clinical Pharmacist [facility] Care Management [phone]

## 2017-10-25 NOTE — Patient Outreach (Signed)
[facility]) Care Management  10/25/2017  Bria Sparr Daus [birth year] [id number]   Call to follow up with patient's PCP to let the provider know that Ms. Koebel reports having stopped taking her losartan prescription due to not having kept her Cardiology follow up appointment due to concerns about copayment cost. Speak with Shanda Bumps in the office. Note that patient has a follow up visit scheduled for next week. Shanda Bumps states that she will send a note to the provider, Dr. Manson Passey, asking her to follow up with the patient regarding her blood pressure/discontinued losartan.  Duanne Moron, PharmD, [facility] Clinical Pharmacist [facility] Care Management [phone]

## 2017-10-27 NOTE — Telephone Encounter (Signed)
Agree with nursing note. Will monitor BP, consider restarting medication as indicated. Will discuss at visit.

## 2017-10-29 ENCOUNTER — Ambulatory Visit: Payer: Medicare Other | Admitting: Family Medicine

## 2017-11-08 ENCOUNTER — Other Ambulatory Visit: Payer: Self-pay

## 2017-11-08 NOTE — Patient Outreach (Signed)
[facility]) Care Management  11/08/2017  [name] 22-Aug-1980 [id number]    Telephone call to the patient to give health information. HIPAA verified. RN Health Coach reviewed the chart and confirmed with the patient that she does not have a diagnosis of Hypertension.  The patient did state that she would like some general information about hypertension.  I reviewed with the patient about signs and symptoms, diet, increasing her activity with weight management, lowering her stress and monitoring her blood pressure.  The patient verbalized understanding.  She stated that she appreciated me calling.  I explained that I will send her a pamphlet about [facility] services and in the future if needed she can contact us.  I am also sending her a booklet Matter of choice blood pressure control and an EMMI on Dash Diet.   Plan RN Health Coach will close the case since no further interventions needed at this time.  Juanell Fairly RN, BSN, [facility] RN Health Coach Disease Management [facility] Dial:  [phone]  Fax: [phone]

## 2017-12-01 ENCOUNTER — Ambulatory Visit (HOSPITAL_COMMUNITY)
Admission: EM | Admit: 2017-12-01 | Discharge: 2017-12-01 | Disposition: A | Discharge status: Home / Self Care | Payer: Medicare Other | Attending: Family Medicine | Admitting: Family Medicine

## 2017-12-01 ENCOUNTER — Other Ambulatory Visit: Payer: Self-pay

## 2017-12-01 ENCOUNTER — Encounter (HOSPITAL_COMMUNITY): Payer: Self-pay | Admitting: Emergency Medicine

## 2017-12-01 DIAGNOSIS — J4541 Moderate persistent asthma with (acute) exacerbation: Secondary | ICD-10-CM | POA: Diagnosis not present

## 2017-12-01 MED ORDER — IPRATROPIUM-ALBUTEROL 0.5-2.5 (3) MG/3ML IN SOLN
3.0000 mL | Freq: Once | RESPIRATORY_TRACT | Status: AC
  Administered 2017-12-01: 3 mL via RESPIRATORY_TRACT

## 2017-12-01 MED ORDER — AZITHROMYCIN 250 MG PO TABS: 250.0000 mg | ORAL_TABLET | Freq: Every day | ORAL | 0 refills | Status: DC

## 2017-12-01 MED ORDER — PREDNISONE 50 MG PO TABS: 50.0000 mg | ORAL_TABLET | Freq: Every day | ORAL | 0 refills | Status: AC

## 2017-12-01 MED ORDER — IPRATROPIUM-ALBUTEROL 0.5-2.5 (3) MG/3ML IN SOLN
RESPIRATORY_TRACT | Status: AC
  Filled 2017-12-01: qty 3

## 2017-12-01 MED ORDER — BUDESONIDE-FORMOTEROL FUMARATE 160-4.5 MCG/ACT IN AERO
2.0000 | INHALATION_SPRAY | Freq: Two times a day (BID) | RESPIRATORY_TRACT | 0 refills | Status: DC
Start: 2017-12-01 — End: 2018-02-12

## 2017-12-01 MED ORDER — ALBUTEROL SULFATE (2.5 MG/3ML) 0.083% IN NEBU: 2.5000 mg | INHALATION_SOLUTION | Freq: Four times a day (QID) | RESPIRATORY_TRACT | 12 refills | Status: DC | PRN

## 2017-12-01 NOTE — Discharge Instructions (Signed)
We gave you breathing treatment in clinic today Please continue to use inhalers as prescribed, I provided you with a printed prescription for Symbicort if needed I have provided you with albuterol nebulizers Please begin prednisone daily with breakfast for 5 days Please also begin azithromycin, 2 tablets today, 1 tablet for the following 4 days to cover any respiratory infection that has developed  Please continue to monitor your breathing, follow-up if symptoms not resolving, worsening, developing fever, chest pain, difficulty breathing

## 2017-12-01 NOTE — ED Provider Notes (Signed)
[facility]    CSN: [id number] Arrival date & time: 12/01/17  1039     History   Chief Complaint Chief Complaint  Patient presents with  . Asthma    HPI [name] is a 37 y.o. female history of asthma, GERD, allergic rhinitis presenting today for evaluation of worsening asthma.  Patient states that she has had cough and congestion.  Symptoms began with a cough approximately 1-1/2 weeks ago, since she is developed a cough that is been significant productive with mucus.  She has been using her inhalers as prescribed.  She is currently on Symbicort twice daily, albuterol as needed.  She has a nebulizer machine, but has not had the medicine for this.  She sees [place] pulmonology.  Denies congestion.  Denies fevers.  She notes that over the past 2 weeks she has had 3 asthma attacks which is atypical for her.  HPI  Past Medical History:  Diagnosis Date  . Asthma     reservations to 2-3 timmes  a year that required ED visits/hospitalizations. Never intubated  . GERD (gastroesophageal reflux disease)   . Seasonal allergies     Patient Active Problem List   Diagnosis Date Noted  . Fatigue 07/01/2017  . Vaginal candidiasis 04/15/2017  . RUQ abdominal pain 03/04/2017  . Acute bilateral low back pain with bilateral sciatica 09/10/2016  . Contraception management 03/17/2015  . Large breasts 07/13/2014  . S/P repeat low transverse C-section 03/05/2014  . Coccyx pain 01/03/2014  . Right shoulder pain 07/24/2013  . Pes planus 07/10/2012  . Plantar fasciitis, bilateral 07/10/2012  . Drug abuse, marijuana 01/20/2011  . Moderate persistent allergic asthma without complication 12/28/2009  . CONSTIPATION, CHRONIC 09/10/2008  . COMMON MIGRAINE 03/20/2007  . Morbid obesity (HCC) 05/24/2006  . Allergic rhinitis 03/14/2006  . GASTROESOPHAGEAL REFLUX, NO ESOPHAGITIS 03/14/2006    Past Surgical History:  Procedure Laterality Date  . CESAREAN SECTION    . CESAREAN SECTION  N/A 10/25/2012   Procedure: CESAREAN SECTION;  Surgeon: Dorien Chihuahua. Richardson Dopp, MD;  Location: [facility];  Service: Obstetrics;  Laterality: N/A;  . CESAREAN SECTION N/A 03/05/2014   Procedure: CESAREAN SECTION;  Surgeon: Purcell Nails, MD;  Location: [facility];  Service: Obstetrics;  Laterality: N/A;  . CHOLECYSTECTOMY N/A 04/09/2017   Procedure: LAPAROSCOPIC CHOLECYSTECTOMY;  Surgeon: Axel Filler, MD;  Location: [facility];  Service: General;  Laterality: N/A;    OB History    Gravida  7   Para  3   Term  3   Preterm  0   AB  4   Living  3     SAB  2   TAB  2   Ectopic  0   Multiple  0   Live Births  3            Home Medications    Prior to Admission medications   Medication Sig Start Date End Date Taking? Authorizing Provider  albuterol (PROAIR HFA) 108 (90 Base) MCG/ACT inhaler INHALE TWO PUFFS INTO LUNGS EVERY 4 HOURS AS NEEDED FOR WHEEZING AND FOR SHORTNESS OF BREATH Patient taking differently: Inhale 1-2 puffs into the lungs every 4 (four) hours as needed. INHALE TWO PUFFS INTO LUNGS EVERY 4 HOURS AS NEEDED FOR WHEEZING AND FOR SHORTNESS OF BREATH 07/17/16  Yes [name], MD  cetirizine (ZYRTEC) 10 MG tablet Take 1 tablet (10 mg total) by mouth daily. 07/17/16  Yes [name], MD  famotidine (PEPCID) 20  MG tablet Take 1 tablet (20 mg total) by mouth at bedtime as needed for heartburn or indigestion. 07/01/17  Yes Coral Ceo, NP  fluticasone (FLONASE) 50 MCG/ACT nasal spray Place 2 sprays into both nostrils 2 (two) times daily. 07/01/17  Yes Coral Ceo, NP  montelukast (SINGULAIR) 10 MG tablet TAKE 1 TABLET BY MOUTH AT BEDTIME Patient taking differently: TAKE 10 MG BY MOUTH AT BEDTIME 01/01/17  Yes [name], MD  albuterol (PROVENTIL) (2.5 MG/3ML) 0.083% nebulizer solution Take 3 mLs (2.5 mg total) by nebulization every 6 (six) hours as needed for wheezing or shortness of breath. 12/01/17   Aine Strycharz C, PA-C  azithromycin (ZITHROMAX) 250  MG tablet Take 1 tablet (250 mg total) by mouth daily. Take first 2 tablets together, then 1 every day until finished. 12/01/17   Geanette Buonocore C, PA-C  budesonide-formoterol (SYMBICORT) 160-4.5 MCG/ACT inhaler Inhale 2 puffs into the lungs 2 (two) times daily. 12/01/17   Kalden Wanke C, PA-C  ibuprofen (ADVIL,MOTRIN) 200 MG tablet Take 200 mg by mouth every 6 (six) hours as needed for headache or moderate pain.     [provider]  predniSONE (DELTASONE) 50 MG tablet Take 1 tablet (50 mg total) by mouth daily for 5 days. 12/01/17 12/06/17  Di Jasmer, Junius Creamer, PA-C    Family History Family History  Problem Relation Age of Onset  . Hypertension Mother   . Allergies Mother   . Asthma Father   . Emphysema Maternal Grandfather   . Allergies Maternal Grandmother   . Asthma Maternal Grandmother     Social History Social History   Tobacco Use  . Smoking status: Former Smoker    Packs/day: 0.30    Years: 0.50    Pack years: 0.15    Types: Cigarettes    Last attempt to quit: 01/15/2001    Years since quitting: 16.8  . Smokeless tobacco: Never Used  Substance Use Topics  . Alcohol use: No    Alcohol/week: 1.0 standard drinks    Types: 1 Standard drinks or equivalent per week    Comment: occasionaly  . Drug use: No     Allergies   Iohexol and Moxifloxacin   Review of Systems Review of Systems  Constitutional: Negative for activity change, appetite change, chills, fatigue and fever.  HENT: Positive for congestion. Negative for ear pain, rhinorrhea, sinus pressure, sore throat and trouble swallowing.   Eyes: Negative for discharge and redness.  Respiratory: Positive for cough, chest tightness, shortness of breath and wheezing.   Cardiovascular: Negative for chest pain.  Gastrointestinal: Negative for abdominal pain, diarrhea, nausea and vomiting.  Musculoskeletal: Negative for myalgias.  Skin: Negative for rash.  Neurological: Negative for dizziness, light-headedness  and headaches.     Physical Exam Triage Vital Signs ED Triage Vitals  Enc Vitals Group     BP 12/01/17 1133 125/74     Pulse Rate 12/01/17 1133 74     Resp 12/01/17 1133 20     Temp 12/01/17 1133 98 F (36.7 C)     Temp Source 12/01/17 1133 Oral     SpO2 12/01/17 1133 99 %     Weight --      Height --      Head Circumference --      Peak Flow --      Pain Score 12/01/17 1130 6     Pain Loc --      Pain Edu? --      Excl.  in GC? --    No data found.  Updated Vital Signs BP 125/74 (BP Location: Right Arm) Comment (BP Location): large cuff  Pulse 74   Temp 98 F (36.7 C) (Oral)   Resp 20   SpO2 99%   Visual Acuity Right Eye Distance:   Left Eye Distance:   Bilateral Distance:    Right Eye Near:   Left Eye Near:    Bilateral Near:     Physical Exam  Constitutional: She appears well-developed and well-nourished. No distress.  HENT:  Head: Normocephalic and atraumatic.  Bilateral ears without tenderness to palpation of external auricle, tragus and mastoid, EAC's without erythema or swelling, TM's with good bony landmarks and cone of light. Non erythematous.  Oral mucosa pink and moist, no tonsillar enlargement or exudate. Posterior pharynx patent and nonerythematous, no uvula deviation or swelling. Normal phonation.   Eyes: Conjunctivae are normal.  Neck: Neck supple.  Cardiovascular: Normal rate and regular rhythm.  No murmur heard. Pulmonary/Chest: Effort normal and breath sounds normal. No respiratory distress.  Moderate expiratory wheezing throughout bilateral lung fields  Abdominal: Soft. There is no tenderness.  Musculoskeletal: She exhibits no edema.  Neurological: She is alert.  Skin: Skin is warm and dry.  Psychiatric: She has a normal mood and affect.  Nursing note and vitals reviewed.    UC Treatments / Results  Labs (all labs ordered are listed, but only abnormal results are displayed) Labs Reviewed - No data to  display  EKG None  Radiology No results found.  Procedures Procedures (including critical care time)  Medications Ordered in UC Medications  ipratropium-albuterol (DUONEB) 0.5-2.5 (3) MG/3ML nebulizer solution 3 mL (has no administration in time range)    Initial Impression / Assessment and Plan / UC Course  I have reviewed the triage vital signs and the nursing notes.  Pertinent labs & imaging results that were available during my care of the patient were reviewed by me and considered in my medical decision making (see chart for details).     Breathing treatment provided in clinic, continue to use inhalers, will refill Symbicort, refill of nebulizers.  Will begin patient on prednisone daily for 5 days.  Will provide azithromycin to cover atypical respiratory illness given symptoms for 1/2 weeks.  Continue to monitor breathing and symptoms,Discussed strict return precautions. Patient verbalized understanding and is agreeable with plan.  Final Clinical Impressions(s) / UC Diagnoses   Final diagnoses:  Moderate persistent asthma with exacerbation     Discharge Instructions     We gave you breathing treatment in clinic today Please continue to use inhalers as prescribed, I provided you with a printed prescription for Symbicort if needed I have provided you with albuterol nebulizers Please begin prednisone daily with breakfast for 5 days Please also begin azithromycin, 2 tablets today, 1 tablet for the following 4 days to cover any respiratory infection that has developed  Please continue to monitor your breathing, follow-up if symptoms not resolving, worsening, developing fever, chest pain, difficulty breathing   ED Prescriptions    Medication Sig Dispense Auth. Provider   predniSONE (DELTASONE) 50 MG tablet Take 1 tablet (50 mg total) by mouth daily for 5 days. 5 tablet Emalynn Clewis C, PA-C   azithromycin (ZITHROMAX) 250 MG tablet Take 1 tablet (250 mg total) by mouth  daily. Take first 2 tablets together, then 1 every day until finished. 6 tablet Felton Buczynski C, PA-C   budesonide-formoterol (SYMBICORT) 160-4.5 MCG/ACT inhaler Inhale 2 puffs into  the lungs 2 (two) times daily. 3 Inhaler Anastasya Jewell C, PA-C   albuterol (PROVENTIL) (2.5 MG/3ML) 0.083% nebulizer solution Take 3 mLs (2.5 mg total) by nebulization every 6 (six) hours as needed for wheezing or shortness of breath. 75 mL Maison Agrusa C, PA-C     Controlled Substance Prescriptions [place] Controlled Substance Registry consulted? Not Applicable   Lew DawesWieters, Kyrillos Adams C, [place]PA-C 12/01/17 1151

## 2017-12-01 NOTE — ED Triage Notes (Signed)
Symptoms started 1 1/2 weeks ago.  Started with cough and congestion, productive cough-green/yellow phlegm.  Asthma attacks are back to back.  No known fever.  Inhalers help, but episodes are coming closer and closer together

## 2017-12-30 ENCOUNTER — Ambulatory Visit (INDEPENDENT_AMBULATORY_CARE_PROVIDER_SITE_OTHER): Payer: Medicare Other | Admitting: Family Medicine

## 2017-12-30 ENCOUNTER — Other Ambulatory Visit: Payer: Self-pay

## 2017-12-30 ENCOUNTER — Encounter: Payer: Self-pay | Admitting: Family Medicine

## 2017-12-30 VITALS — BP 140/78 | HR 80 | Temp 98.8°F | Ht 66.0 in | Wt 269.0 lb

## 2017-12-30 DIAGNOSIS — R062 Wheezing: Secondary | ICD-10-CM | POA: Diagnosis not present

## 2017-12-30 MED ORDER — PREDNISONE 50 MG PO TABS: 50.0000 mg | ORAL_TABLET | Freq: Every day | ORAL | 0 refills | Status: DC

## 2017-12-30 MED ORDER — ALBUTEROL SULFATE (2.5 MG/3ML) 0.083% IN NEBU
2.5000 mg | INHALATION_SOLUTION | Freq: Once | RESPIRATORY_TRACT | Status: AC
  Administered 2017-12-30: 2.5 mg via RESPIRATORY_TRACT

## 2017-12-30 NOTE — Progress Notes (Signed)
Date of Visit: 12/30/2017   HPI:  Patient presents to discuss asthma flareup.  [facility] Treated with zpack and prednisone with improvement in symptoms. Then got sick again after her partner was sick. Symptoms began about 3 days ago this time. No fevers. Has had nasal congestion and runny nose. Some decreased appetite but is drinking liquids well. Has cough, wheezing and chest tightness. Using albuterol inhaler and neb with some relief. Thinks she needs another round of prednisone  ROS: See HPI.  PMFSH: history of morbid obesity, allergic rhinitis, GERD, constipation, asthma  PHYSICAL EXAM: BP 140/78   Pulse 80   Temp 98.8 F (37.1 C) (Oral)   Ht 5\' 6"  (1.676 m)   Wt 269 lb (122 kg)   SpO2 96%   BMI 43.42 kg/m  Gen: no acute distress, pleasant, cooperative HEENT: normocephalic, atraumatic, moist mucous membranes., tympanic membranes clear bilaterally. Nares patent Heart: regular rate and rhythm, no murmur Lungs: normal work of breathing. Speaks in full sentences without distress. Some end expiratory wheeze prior to admin of albuterol neb, with improvement in wheezes after neb treatment Neuro: alert, grossly nonfocal, speech normal  ASSESSMENT/PLAN:  Health maintenance:  -advised patient to schedule physical as she is due for pap smear  Asthma exacerbation Triggered by viral URI. No hypoxia with ambulatory pulse ox. Normal work of breathing. Improved some with albuterol neb here in clinic. Will treat with prednisone 50mg  daily. Continue albuterol nebs/ MDI. Follow up if not improving.  FOLLOW UP: Schedule physical  GrenadaBrittany J. Pollie MeyerMcIntyre, MD [facility]

## 2017-12-30 NOTE — Patient Instructions (Addendum)
Take prednisone 50mg  daily for 5 days Call if not improving, if severe go to ER  Continue albuterol 2 puffs every 4 hours as needed  Schedule complete physical so we can update your pap smear  Be well, Dr. Pollie MeyerMcIntyre

## 2018-01-21 ENCOUNTER — Ambulatory Visit: Payer: Medicare Other | Admitting: Family Medicine

## 2018-02-03 ENCOUNTER — Encounter: Payer: Self-pay | Admitting: Internal Medicine

## 2018-02-12 ENCOUNTER — Ambulatory Visit (INDEPENDENT_AMBULATORY_CARE_PROVIDER_SITE_OTHER): Payer: Medicare Other | Admitting: Family Medicine

## 2018-02-12 ENCOUNTER — Encounter: Payer: Self-pay | Admitting: Family Medicine

## 2018-02-12 ENCOUNTER — Other Ambulatory Visit: Payer: Self-pay

## 2018-02-12 VITALS — BP 138/80 | HR 77 | Temp 99.1°F | Ht 66.0 in | Wt 273.4 lb

## 2018-02-12 DIAGNOSIS — R635 Abnormal weight gain: Secondary | ICD-10-CM

## 2018-02-12 DIAGNOSIS — Z3009 Encounter for other general counseling and advice on contraception: Secondary | ICD-10-CM

## 2018-02-12 DIAGNOSIS — J4541 Moderate persistent asthma with (acute) exacerbation: Secondary | ICD-10-CM

## 2018-02-12 DIAGNOSIS — J309 Allergic rhinitis, unspecified: Secondary | ICD-10-CM

## 2018-02-12 LAB — POCT URINE PREGNANCY: Preg Test, Ur: NEGATIVE

## 2018-02-12 MED ORDER — BUDESONIDE-FORMOTEROL FUMARATE 160-4.5 MCG/ACT IN AERO: 2.0000 | INHALATION_SPRAY | Freq: Two times a day (BID) | RESPIRATORY_TRACT | 11 refills | Status: DC

## 2018-02-12 MED ORDER — MONTELUKAST SODIUM 10 MG PO TABS: ORAL_TABLET | ORAL | 3 refills | Status: DC

## 2018-02-12 MED ORDER — PREDNISONE 20 MG PO TABS: 40.0000 mg | ORAL_TABLET | Freq: Every day | ORAL | 0 refills | Status: DC

## 2018-02-12 MED ORDER — FLUTICASONE PROPIONATE 50 MCG/ACT NA SUSP: 2.0000 | Freq: Two times a day (BID) | NASAL | 3 refills | Status: DC

## 2018-02-12 NOTE — Progress Notes (Signed)
  Patient Name: [name] Date of Birth: 25-May-1980 Date of Visit: 02/12/18 PCP: Latrelle DodrillMcIntyre, Brittany J, MD  Chief Complaint: breathing issue   Subjective: [name] is a pleasant 38 y.o. woman with history of moderate persistent asthma, allergies (ragweed, dogs, roaches) , obesity, and migraines presenting with wheezing and pleuritic chest pain. She reports a 1.5 week history of cough, congestion, and worsening wheezing. Over the past 3 days she has had increasing albuterol use. She has reproducible right sided rib pain, sometimes worse with breathing. No chest pain with exertion. No LE edema, orthopnea, PND. She does not smoke. Lives in home with carpet. No pets. No sick children. She thinks weather triggered symptoms. She has been taking Singulair and Symbicort. She is out of flonase X 1 month.   [name] has noted progressive weight gain. She reports 'comfort eating'. Denies polyuria, polydipsia, signs/symptoms of hypothyroidism. She is sexually active. Uses nothing for contraception. Has several stressors at home--currently moving apartments and getting married in spring.   The patient has tried OCPs and Nexplanon for contraception (had weight gain and irregular bleeding). She dislikes the thought of an IUD. Dislikes Depo due to weight gain. Has two toddlers and 37 year old. She is getting married this spring.    ROS: Per HPI.  ROS  I have reviewed the patient's medical, surgical, family, and social history as appropriate.   Vitals:   02/12/18 0931  BP: 138/80  Pulse: 77  Temp: 99.1 F (37.3 C)  SpO2: 98%   Filed Weights   02/12/18 0931  Weight: 273 lb 6.4 oz (124 kg)   HEENT: Sclera anicteric. Dentition is moderate. Appears well hydrated. Neck: Supple Cardiac: Regular rate and rhythm. Normal S1/S2. No murmurs, rubs, or gallops appreciated. Lungs: Obese, no tachypnea, no increased WOB. Diminished air entry with bilateral expiratory wheezing.  Extremities: Warm, well  perfused without edema.  Skin: Warm, dry Psych: Pleasant and appropriate     [place] was seen today for asthma.  Diagnoses and all orders for this visit:  Weight gain -     POCT urine pregnancy - Discussed dietary and exercise recommendations. Discussed logging food, reducing sugar sweetened beverages.   Contraception, discussed options at length, recommended prenatal vitamins.   Moderate persistent asthma with (acute) exacerbation -     budesonide-formoterol (SYMBICORT) 160-4.5 MCG/ACT inhaler; Inhale 2 puffs into the lungs 2 (two) times daily. - Prednisone 40 mg X 5 days - New prescription for Flonase - Discussed return precautions at length.  - Recommended scheduling follow up with Pulmonary medicine (3 exacerbations requiring steroid in 3 months).   Terisa Starrarina Zymiere Trostle, MD  Family Medicine Teaching Service

## 2018-02-12 NOTE — Patient Instructions (Addendum)
   It was wonderful to see you today.  Thank you for choosing [facility].   Please call [phone] with any questions about today's appointment.  Please be sure to schedule follow up at the front  desk before you leave today.   Terisa Starr, MD  Family Medicine   Please schedule follow-up with Dr. Pollie Meyer and your pulmonologist to discuss your asthma control.   Please return to care or go to the emergency department if you develop fevers, worsening breathing or chest pain.   Keep thinking about contraception also called birth control.  There are lots of options. I recommend bedsider.org as a reference for options.    Good luck with wedding planning!   Prednisone 2 tabs a day for 5 days---- take with food.

## 2018-02-17 ENCOUNTER — Ambulatory Visit: Payer: Medicare Other

## 2018-02-25 ENCOUNTER — Ambulatory Visit (INDEPENDENT_AMBULATORY_CARE_PROVIDER_SITE_OTHER): Payer: Medicare Other | Admitting: Family Medicine

## 2018-02-25 ENCOUNTER — Encounter: Payer: Self-pay | Admitting: Family Medicine

## 2018-02-25 ENCOUNTER — Other Ambulatory Visit (HOSPITAL_COMMUNITY)
Admission: RE | Admit: 2018-02-25 | Discharge: 2018-02-25 | Disposition: A | Discharge status: Home / Self Care | Payer: Medicare Other | Source: Ambulatory Visit | Attending: Family Medicine | Admitting: Family Medicine

## 2018-02-25 VITALS — BP 125/80 | HR 73 | Temp 99.1°F | Wt 273.4 lb

## 2018-02-25 DIAGNOSIS — D649 Anemia, unspecified: Secondary | ICD-10-CM | POA: Diagnosis not present

## 2018-02-25 DIAGNOSIS — Z01419 Encounter for gynecological examination (general) (routine) without abnormal findings: Secondary | ICD-10-CM | POA: Insufficient documentation

## 2018-02-25 DIAGNOSIS — Z0000 Encounter for general adult medical examination without abnormal findings: Secondary | ICD-10-CM | POA: Diagnosis not present

## 2018-02-25 DIAGNOSIS — Z113 Encounter for screening for infections with a predominantly sexual mode of transmission: Secondary | ICD-10-CM

## 2018-02-25 NOTE — Patient Instructions (Signed)
It was great to see you again today!  Checking blood work today Follow up in 1 year for next physical  Be well, Dr. Ardelia Mems   Health Maintenance, Female Adopting a healthy lifestyle and getting preventive care can go a long way to promote health and wellness. Talk with your health care provider about what schedule of regular examinations is right for you. This is a good chance for you to check in with your provider about disease prevention and staying healthy. In between checkups, there are plenty of things you can do on your own. Experts have done a lot of research about which lifestyle changes and preventive measures are most likely to keep you healthy. Ask your health care provider for more information. Weight and diet Eat a healthy diet  Be sure to include plenty of vegetables, fruits, low-fat dairy products, and lean protein.  Do not eat a lot of foods high in solid fats, added sugars, or salt.  Get regular exercise. This is one of the most important things you can do for your health. ? Most adults should exercise for at least 150 minutes each week. The exercise should increase your heart rate and make you sweat (moderate-intensity exercise). ? Most adults should also do strengthening exercises at least twice a week. This is in addition to the moderate-intensity exercise. Maintain a healthy weight  Body mass index (BMI) is a measurement that can be used to identify possible weight problems. It estimates body fat based on height and weight. Your health care provider can help determine your BMI and help you achieve or maintain a healthy weight.  For females 13 years of age and older: ? A BMI below 18.5 is considered underweight. ? A BMI of 18.5 to 24.9 is normal. ? A BMI of 25 to 29.9 is considered overweight. ? A BMI of 30 and above is considered obese. Watch levels of cholesterol and blood lipids  You should start having your blood tested for lipids and cholesterol at 37 years  of age, then have this test every 5 years.  You may need to have your cholesterol levels checked more often if: ? Your lipid or cholesterol levels are high. ? You are older than 37 years of age. ? You are at high risk for heart disease. Cancer screening Lung Cancer  Lung cancer screening is recommended for adults 75-47 years old who are at high risk for lung cancer because of a history of smoking.  A yearly low-dose CT scan of the lungs is recommended for people who: ? Currently smoke. ? Have quit within the past 15 years. ? Have at least a 30-pack-year history of smoking. A pack year is smoking an average of one pack of cigarettes a day for 1 year.  Yearly screening should continue until it has been 15 years since you quit.  Yearly screening should stop if you develop a health problem that would prevent you from having lung cancer treatment. Breast Cancer  Practice breast self-awareness. This means understanding how your breasts normally appear and feel.  It also means doing regular breast self-exams. Let your health care provider know about any changes, no matter how small.  If you are in your 20s or 30s, you should have a clinical breast exam (CBE) by a health care provider every 1-3 years as part of a regular health exam.  If you are 97 or older, have a CBE every year. Also consider having a breast X-ray (mammogram) every year.  If  you have a family history of breast cancer, talk to your health care provider about genetic screening.  If you are at high risk for breast cancer, talk to your health care provider about having an MRI and a mammogram every year.  Breast cancer gene (BRCA) assessment is recommended for women who have family members with BRCA-related cancers. BRCA-related cancers include: ? Breast. ? Ovarian. ? Tubal. ? Peritoneal cancers.  Results of the assessment will determine the need for genetic counseling and BRCA1 and BRCA2 testing. Cervical Cancer Your  health care provider may recommend that you be screened regularly for cancer of the pelvic organs (ovaries, uterus, and vagina). This screening involves a pelvic examination, including checking for microscopic changes to the surface of your cervix (Pap test). You may be encouraged to have this screening done every 3 years, beginning at age 54.  For women ages 27-65, health care providers may recommend pelvic exams and Pap testing every 3 years, or they may recommend the Pap and pelvic exam, combined with testing for human papilloma virus (HPV), every 5 years. Some types of HPV increase your risk of cervical cancer. Testing for HPV may also be done on women of any age with unclear Pap test results.  Other health care providers may not recommend any screening for nonpregnant women who are considered low risk for pelvic cancer and who do not have symptoms. Ask your health care provider if a screening pelvic exam is right for you.  If you have had past treatment for cervical cancer or a condition that could lead to cancer, you need Pap tests and screening for cancer for at least 20 years after your treatment. If Pap tests have been discontinued, your risk factors (such as having a new sexual partner) need to be reassessed to determine if screening should resume. Some women have medical problems that increase the chance of getting cervical cancer. In these cases, your health care provider may recommend more frequent screening and Pap tests. Colorectal Cancer  This type of cancer can be detected and often prevented.  Routine colorectal cancer screening usually begins at 37 years of age and continues through 37 years of age.  Your health care provider may recommend screening at an earlier age if you have risk factors for colon cancer.  Your health care provider may also recommend using home test kits to check for hidden blood in the stool.  A small camera at the end of a tube can be used to examine your  colon directly (sigmoidoscopy or colonoscopy). This is done to check for the earliest forms of colorectal cancer.  Routine screening usually begins at age 50.  Direct examination of the colon should be repeated every 5-10 years through 37 years of age. However, you may need to be screened more often if early forms of precancerous polyps or small growths are found. Skin Cancer  Check your skin from head to toe regularly.  Tell your health care provider about any new moles or changes in moles, especially if there is a change in a mole's shape or color.  Also tell your health care provider if you have a mole that is larger than the size of a pencil eraser.  Always use sunscreen. Apply sunscreen liberally and repeatedly throughout the day.  Protect yourself by wearing long sleeves, pants, a wide-brimmed hat, and sunglasses whenever you are outside. Heart disease, diabetes, and high blood pressure  High blood pressure causes heart disease and increases the risk of stroke.  High blood pressure is more likely to develop in: ? People who have blood pressure in the high end of the normal range (130-139/85-89 mm Hg). ? People who are overweight or obese. ? People who are African American.  If you are 33-49 years of age, have your blood pressure checked every 3-5 years. If you are 77 years of age or older, have your blood pressure checked every year. You should have your blood pressure measured twice-once when you are at a hospital or clinic, and once when you are not at a hospital or clinic. Record the average of the two measurements. To check your blood pressure when you are not at a hospital or clinic, you can use: ? An automated blood pressure machine at a pharmacy. ? A home blood pressure monitor.  If you are between 52 years and 23 years old, ask your health care provider if you should take aspirin to prevent strokes.  Have regular diabetes screenings. This involves taking a blood sample to  check your fasting blood sugar level. ? If you are at a normal weight and have a low risk for diabetes, have this test once every three years after 37 years of age. ? If you are overweight and have a high risk for diabetes, consider being tested at a younger age or more often. Preventing infection Hepatitis B  If you have a higher risk for hepatitis B, you should be screened for this virus. You are considered at high risk for hepatitis B if: ? You were born in a country where hepatitis B is common. Ask your health care provider which countries are considered high risk. ? Your parents were born in a high-risk country, and you have not been immunized against hepatitis B (hepatitis B vaccine). ? You have HIV or AIDS. ? You use needles to inject street drugs. ? You live with someone who has hepatitis B. ? You have had sex with someone who has hepatitis B. ? You get hemodialysis treatment. ? You take certain medicines for conditions, including cancer, organ transplantation, and autoimmune conditions. Hepatitis C  Blood testing is recommended for: ? Everyone born from [birth year] through 1964. ? Anyone with known risk factors for hepatitis C. Sexually transmitted infections (STIs)  You should be screened for sexually transmitted infections (STIs) including gonorrhea and chlamydia if: ? You are sexually active and are younger than 37 years of age. ? You are older than 37 years of age and your health care provider tells you that you are at risk for this type of infection. ? Your sexual activity has changed since you were last screened and you are at an increased risk for chlamydia or gonorrhea. Ask your health care provider if you are at risk.  If you do not have HIV, but are at risk, it may be recommended that you take a prescription medicine daily to prevent HIV infection. This is called pre-exposure prophylaxis (PrEP). You are considered at risk if: ? You are sexually active and do not regularly use  condoms or know the HIV status of your partner(s). ? You take drugs by injection. ? You are sexually active with a partner who has HIV. Talk with your health care provider about whether you are at high risk of being infected with HIV. If you choose to begin PrEP, you should first be tested for HIV. You should then be tested every 3 months for as long as you are taking PrEP. Pregnancy  If you are premenopausal and  you may become pregnant, ask your health care provider about preconception counseling.  If you may become pregnant, take 400 to 800 micrograms (mcg) of folic acid every day.  If you want to prevent pregnancy, talk to your health care provider about birth control (contraception). Osteoporosis and menopause  Osteoporosis is a disease in which the bones lose minerals and strength with aging. This can result in serious bone fractures. Your risk for osteoporosis can be identified using a bone density scan.  If you are 65 years of age or older, or if you are at risk for osteoporosis and fractures, ask your health care provider if you should be screened.  Ask your health care provider whether you should take a calcium or vitamin D supplement to lower your risk for osteoporosis.  Menopause may have certain physical symptoms and risks.  Hormone replacement therapy may reduce some of these symptoms and risks. Talk to your health care provider about whether hormone replacement therapy is right for you. Follow these instructions at home:  Schedule regular health, dental, and eye exams.  Stay current with your immunizations.  Do not use any tobacco products including cigarettes, chewing tobacco, or electronic cigarettes.  If you are pregnant, do not drink alcohol.  If you are breastfeeding, limit how much and how often you drink alcohol.  Limit alcohol intake to no more than 1 drink per day for nonpregnant women. One drink equals 12 ounces of beer, 5 ounces of wine, or 1 ounces of  hard liquor.  Do not use street drugs.  Do not share needles.  Ask your health care provider for help if you need support or information about quitting drugs.  Tell your health care provider if you often feel depressed.  Tell your health care provider if you have ever been abused or do not feel safe at home. This information is not intended to replace advice given to you by your health care provider. Make sure you discuss any questions you have with your health care provider. Document Released: 07/17/2010 Document Revised: 06/09/2015 Document Reviewed: 10/05/2014 Elsevier Interactive Patient Education  2019 [employer].

## 2018-02-25 NOTE — Progress Notes (Signed)
Date of Visit: 02/25/2018   HPI:  Patient presents today for a well woman exam.   Concerns today: none Periods: monthly periods, lasts 7 days Contraception: declines all contraception, is open to the idea of pregnancy     Pelvic symptoms: no vaginal discharge or pelvic pain (recently treated for yeast with over the counter monistat, symptoms resolved) Sexual activity: with female fiance STD Screening: desires this today Pap smear status: due today [facility]: [facility], feels better doing this Smoking: no Alcohol: no Drugs: no Mood: overall good Dentist: yes  ROS: See HPI  PMFSH:  Cancers in family: none  PHYSICAL EXAM: BP 125/80   Pulse 73   Temp 99.1 F (37.3 C) (Oral)   Wt 273 lb 6.4 oz (124 kg)   SpO2 98%   BMI 44.13 kg/m  Gen: NAD, pleasant, cooperative HEENT: NCAT, PERRL, no palpable thyromegaly or anterior cervical lymphadenopathy Heart: RRR, no murmurs Lungs: CTAB, NWOB Abdomen: soft, nontender to palpation Neuro: grossly nonfocal, speech normal GU: normal appearing external genitalia without lesions. Vagina is moist with white discharge. Cervix normal in appearance. No cervical motion tenderness or tenderness on bimanual exam. No adnexal masses.   ASSESSMENT/PLAN:  Health maintenance:  -STD screening: check gc/chl/trich, HIV, RPR today -pap smear: done today -handout given on health maintenance topics  History of anemia Pulmonologist wanted [place] to check iron studies. Will check ferritin, iron studies, CBC today with labs  FOLLOW UP: Follow up in 1 year for next CPE  [place] J. Pollie Meyer, MD [facility]

## 2018-02-26 LAB — CBC
HEMATOCRIT: 34.6 % (ref 34.0–46.6)
HEMOGLOBIN: 11.3 g/dL (ref 11.1–15.9)
MCH: 25.2 pg — ABNORMAL LOW (ref 26.6–33.0)
MCHC: 32.7 g/dL (ref 31.5–35.7)
MCV: 77 fL — AB (ref 79–97)
Platelets: 389 10*3/uL (ref 150–450)
RBC: 4.48 x10E6/uL (ref 3.77–5.28)
RDW: 14.4 % (ref 11.7–15.4)
WBC: 8.1 10*3/uL (ref 3.4–10.8)

## 2018-02-26 LAB — CERVICOVAGINAL ANCILLARY ONLY
Chlamydia: NEGATIVE
Neisseria Gonorrhea: NEGATIVE
Trichomonas: NEGATIVE

## 2018-02-26 LAB — IRON AND TIBC
IRON SATURATION: 12 % — AB (ref 15–55)
Iron: 37 ug/dL (ref 27–159)
Total Iron Binding Capacity: 306 ug/dL (ref 250–450)
UIBC: 269 ug/dL (ref 131–425)

## 2018-02-26 LAB — HIV ANTIBODY (ROUTINE TESTING W REFLEX): HIV Screen 4th Generation wRfx: NONREACTIVE

## 2018-02-26 LAB — RPR: RPR: NONREACTIVE

## 2018-02-26 LAB — FERRITIN: FERRITIN: 46 ng/mL (ref 15–150)

## 2018-02-27 LAB — CYTOLOGY - PAP
DIAGNOSIS: NEGATIVE
HPV: NOT DETECTED

## 2018-02-28 ENCOUNTER — Encounter: Payer: Self-pay | Admitting: Family Medicine

## 2018-03-03 ENCOUNTER — Ambulatory Visit (INDEPENDENT_AMBULATORY_CARE_PROVIDER_SITE_OTHER): Payer: Medicare Other | Admitting: Internal Medicine

## 2018-03-03 ENCOUNTER — Encounter: Payer: Self-pay | Admitting: Internal Medicine

## 2018-03-03 VITALS — BP 122/84 | HR 61 | Ht 66.0 in | Wt 274.0 lb

## 2018-03-03 DIAGNOSIS — J4540 Moderate persistent asthma, uncomplicated: Secondary | ICD-10-CM

## 2018-03-03 NOTE — Progress Notes (Signed)
Subjective:   Patient ID: [name], female    DOB: [birth year],   MRN: [id number]    Brief patient profile:  89 yobf with asthma since age 38 on maint rx ever since then quit smoking 2004 and no better intially seen in pulmonary clinic by Dr Sung AmabileSimonds early 2000s [facility]/ ER with dtca asthma she attributed to allergies self referred to Pulmonary clinic 04/13/2015 p last ov:   02/22/2011 recs Pepcid 20 mg one at bedtime GERD diet / lifestyle    Plan A is to use the symbicort 160 Take 2 puffs first thing in am and then another 2 puffs about 12 hours later and the singulair at night If you can't catch your breath, go to Plan B= ok to use ventolin hfa if needed up to every 4 hours with the goal of less than twice weekly If you still catch your breath Plan C = use the nebulizer up to 4 hours and call us right away for appt to see me or Tammy Nurse Practioner. F/u 4 weeks > no show     04/13/2015 1st [facility] office visit/ Nayel Purdy  maint rx symbicort 160 2 bid Chief Complaint  Patient presents with  . Pulmonary Consult    Self referral Asthma- former pt, last seen 2013  while on maint on symbicort 160 2bid but two er trips both p exp to dogs while living at [place] house but getting ready to move.  symbicort is 90 dollars/ proair is 45 and uses maybe one every 6 m and used 3 x then to ER  Off symb x 3 days  Overt HB on bcps rec Plan A = Automatic = Symbicort 160 Take 2 puffs first thing in am and then another 2 puffs about 12 hours later                                     Singulair 10 mg each day  Plan B = Backup Only use your albuterol as a rescue medication  Work on maintaining perfect inhaler technique:   Continue pepcid 20 mg at bedtime and as needed during the day GERD (REFLUX)  is an extremely common cause of respiratory symptoms just like yours , many times with no obvious heartburn at all.  Discuss with your gyn alternatives to your birth control  pills which promote more reflux     06/22/2015  f/u ov/Amma Crear re: preop asthma eval for breast reduciton   Chief Complaint  Patient presents with  . Follow-up    Pt states had to go to ED 06/14/15 with asthma flare. Breathing is now back at her normal baseline. She uses albuterol inhaler 1 to 2 x per wk on average.   off med x 2 weeks before attack p exp to smoke from grill > Er as above  Back on symb 2 bid and singulair now Able to sleep ok on 2 pillows s noct resp c/o's rec  continue symb 160 2bid    07/01/17  NP eval for flare off hs h2  Continue your Symbicort >>> 2 puffs in the morning right when you wake up, rinse your mouth out, 12 hours later 2 more puffs, rinse her mouth out after use >>> Continue this daily no matter what Let us restart a daily antihistamine >>> You can do Claritin/loratadine 10 mg daily Fluticasone nasal spray >>> 2 sprays  each nostril daily as needed for nasal congestion or allergy symptoms Restart Pepcid 20 mg >>> Take at night  >>> Reviewed literature and discharge paperwork below about appropriate diet for acid reflux management >>> Continue working towards a healthy weight Lab work today >>> TSH and CBC >>> Complete this in the basement of our office today  when leaving this appointment >>>if these are irregular then primary care can manage     10/01/2017  f/u ov/Orelia Brandstetter re: asthma maint on symb 160/ singulair and noct h2  Chief Complaint  Patient presents with  . Follow-up    Breathing has returned to baseline. She rarely uses her albuterol inhaler.  She c/o swelling in her left ankle and both hands for the past few wks.    Dyspnea:  MMRC1 = can walk nl pace, flat grade, can't hurry or go uphills or steps s sob   Cough: none Sleeping: flat / 1-2 pillows  SABA use: rare 02: none   rec Not change medications for now Please remember to go to the lab department downstairs in the basement  for your tests - we will call you with the results when they  are available.   03/03/2018  f/u ov/Maryna Yeagle re:  Asthma maint on symb 160 / singulair  Chief Complaint  Patient presents with  . Follow-up    sob resolved p predniosone by pcp,   has not not needed neb recently.   Dyspnea:  Housework / flat walking ok , steps are still a problem Cough: none now  Sleeping: on 30 degrees electric bed SABA use: as above, was not using any albuterol until a few weeks ago then flare ? mech  Went to pcp sev weeks prior to OV  > prednisone corrected the problem last dose sev weeks prior to OV     No obvious day to day or daytime variability or assoc excess/ purulent sputum or mucus plugs or hemoptysis or cp or chest tightness, subjective wheeze or overt sinus or hb symptoms.   Sleeping as above without nocturnal  or early am exacerbation  of respiratory  c/o's or need for noct saba. Also denies any obvious fluctuation of symptoms with weather or environmental changes or other aggravating or alleviating factors except as outlined above   No unusual exposure hx or h/o childhood pna or knowledge of premature birth.  Current Allergies, Complete Past Medical History, Past Surgical History, Family History, and Social History were reviewed in [employer] record.  ROS  The following are not active complaints unless bolded Hoarseness, sore throat, dysphagia, dental problems, itching, sneezing,  nasal congestion or discharge of excess mucus or purulent secretions, ear ache,   fever, chills, sweats, unintended wt loss or wt gain, classically pleuritic or exertional cp,  orthopnea pnd or arm/hand swelling  or leg swelling, presyncope, palpitations, abdominal pain, anorexia, nausea, vomiting, diarrhea  or change in bowel habits or change in bladder habits, change in stools or change in urine, dysuria, hematuria,  rash, arthralgias, visual complaints, headache, numbness, weakness or ataxia or problems with walking or coordination,  change in mood or  memory.         Current Meds  Medication Sig  . albuterol (PROAIR HFA) 108 (90 Base) MCG/ACT inhaler INHALE TWO PUFFS INTO LUNGS EVERY 4 HOURS AS NEEDED FOR WHEEZING AND FOR SHORTNESS OF BREATH (Patient taking differently: Inhale 1-2 puffs into the lungs every 4 (four) hours as needed. INHALE TWO PUFFS INTO LUNGS EVERY 4 HOURS  AS NEEDED FOR WHEEZING AND FOR SHORTNESS OF BREATH)  . albuterol (PROVENTIL) (2.5 MG/3ML) 0.083% nebulizer solution Take 3 mLs (2.5 mg total) by nebulization every 6 (six) hours as needed for wheezing or shortness of breath.  . budesonide-formoterol (SYMBICORT) 160-4.5 MCG/ACT inhaler Inhale 2 puffs into the lungs 2 (two) times daily.  . cetirizine (ZYRTEC) 10 MG tablet Take 1 tablet (10 mg total) by mouth daily.  . famotidine (PEPCID) 20 MG tablet Take 1 tablet (20 mg total) by mouth at bedtime as needed for heartburn or indigestion.  . fluticasone (FLONASE) 50 MCG/ACT nasal spray Place 2 sprays into both nostrils 2 (two) times daily.  Marland Kitchen ibuprofen (ADVIL,MOTRIN) 200 MG tablet Take 200 mg by mouth every 6 (six) hours as needed for headache or moderate pain.   . montelukast (SINGULAIR) 10 MG tablet TAKE 10 MG BY MOUTH AT BEDTIME             Objective:   Physical Exam  amb pleasant obese bf nad    03/03/2018       274  10/01/2017       260  06/22/2015          245  04/13/15 243 lb 6.4 oz (110.406 kg)  04/03/15 239 lb (108.41 kg)  03/14/15 242 lb 1.6 oz (109.816 kg)        HEENT: nl dentition, turbinates bilaterally, and oropharynx. Nl external ear canals without cough reflex   NECK :  without JVD/Nodes/TM/ nl carotid upstrokes bilaterally   LUNGS: no acc muscle use,  Nl contour chest which is clear to A and P bilaterally without cough on insp or exp maneuvers   CV:  RRR  no s3 or murmur or increase in P2, and no edema   ABD:  soft and nontender with nl inspiratory excursion in the supine position. No bruits or organomegaly appreciated, bowel sounds nl  MS:  Nl  gait/ ext warm without deformities, calf tenderness, cyanosis or clubbing No obvious joint restrictions   SKIN: warm and dry without lesions    NEURO:  alert, approp, nl sensorium with  no motor or cerebellar deficits apparent.                Assessment & Plan:

## 2018-03-03 NOTE — Assessment & Plan Note (Addendum)
Onset age 38   - NO 04/13/2015  = 119 3 d off symbicort - FENO 06/22/2015  107  - FENO 10/01/2017  =   47 - Allergy profile 10/01/2017 >  Eos 0.1/  IgE  100 ragweed, dog> cat roaches  03/03/2018  After extensive coaching inhaler device,  effectiveness =    95%   Not really clear why she flared but suspect it was either due to viral uri or non-adherence noting that she is exquisitely dep on symbicort in high doses and now maint back on them and doing fine again.    Contingencies discussed in full including contacting this office immediately if not controlling the symptoms using the rule of two's.  ie If your breathing worsens or you need to use your rescue inhaler more than twice weekly or wake up more than twice a month with any respiratory symptoms or require more than two rescue inhalers per year, we need to see you right away because this means we're not controlling the underlying problem (inflammation) adequately.  Rescue inhalers (albuterol) do not control inflammation and overuse can lead to unnecessary and costly consequences.  They can make you feel better temporarily but eventually they will quit working effectively much as sleep aids lead to more insomnia if used regularly.

## 2018-03-03 NOTE — Assessment & Plan Note (Signed)
Body mass index is 44.22 kg/m.  -  trending up  Lab Results  Component Value Date   TSH 1.34 07/01/2017     Contributing to gerd risk/ doe/reviewed the need and the process to achieve and maintain neg calorie balance > defer f/u primary care including intermittently monitoring thyroid status     I had an extended discussion with the patient reviewing all relevant studies completed to date and  lasting 15 to 20 minutes of a 25 minute visit    See device teaching which extended face to face time for this visit.  Each maintenance medication was reviewed in detail including emphasizing most importantly the difference between maintenance and prns and under what circumstances the prns are to be triggered using an action plan format that is not reflected in the computer generated alphabetically organized AVS which I have not found useful in most complex patients, especially with respiratory illnesses  Please see AVS for specific instructions unique to this visit that I personally wrote and verbalized to the the pt in detail and then reviewed with pt  by my nurse highlighting any  changes in therapy recommended at today's visit to their plan of care.

## 2018-03-03 NOTE — Patient Instructions (Signed)
Work on inhaler technique:  relax and gently blow all the way out then take a nice smooth deep breath back in, triggering the inhaler at same time you start breathing in.  Hold for up to 5 seconds if you can. Blow out thru nose. Rinse and gargle with water when done      Only use your albuterol as a rescue medication to be used if you can't catch your breath by resting or doing a relaxed purse lip breathing pattern.  - The less you use it, the better it will work when you need it. - Ok to use up to 2 puffs  every 4 hours if you must but call for immediate appointment if use goes up over your usual need - Don't leave home without it !!  (think of it like the spare tire for your car)    Please schedule a follow up visit in 6  months but call sooner if needed

## 2018-03-18 ENCOUNTER — Ambulatory Visit (INDEPENDENT_AMBULATORY_CARE_PROVIDER_SITE_OTHER): Payer: Medicare Other

## 2018-03-18 VITALS — BP 120/80 | HR 65 | Temp 98.9°F | Ht 66.0 in | Wt 273.6 lb

## 2018-03-18 DIAGNOSIS — Z0000 Encounter for general adult medical examination without abnormal findings: Secondary | ICD-10-CM

## 2018-03-18 DIAGNOSIS — Z0100 Encounter for examination of eyes and vision without abnormal findings: Secondary | ICD-10-CM | POA: Diagnosis not present

## 2018-03-18 NOTE — Progress Notes (Signed)
I have reviewed this visit and agree with the documentation.  ?Muaaz Brau J Daymond Cordts, MD  ?

## 2018-03-18 NOTE — Patient Instructions (Addendum)
[name] , Thank you for taking time to come for your Medicare Wellness Visit. I appreciate your ongoing commitment to your health goals. Please review the following plan we discussed and let me know if I can assist you in the future.   These are the goals we discussed: Goals      Patient Stated   . Patient Stated (pt-stated)     Weight loss. Wants to prepare food differently. Baked vs frying.        This is a list of the screening recommended for you and due dates:  Health Maintenance  Topic Date Due  . Flu Shot  05/21/2018*  . Pap Smear  02/26/2023  . Tetanus Vaccine  12/05/2026  . HIV Screening  Completed  *Topic was postponed. The date shown is not the original due date.     Fat and Cholesterol Restricted Eating Plan Getting too much fat and cholesterol in your diet may cause health problems. Choosing the right foods helps keep your fat and cholesterol at normal levels. This can keep you from getting certain diseases. Your doctor may recommend an eating plan that includes:  Total fat: ______% or less of total calories a day.  Saturated fat: ______% or less of total calories a day.  Cholesterol: less than _________mg a day.  Fiber: ______g a day. What are tips for following this plan? Meal planning  At meals, divide your plate into four equal parts: ? Fill one-half of your plate with vegetables and green salads. ? Fill one-fourth of your plate with whole grains. ? Fill one-fourth of your plate with low-fat (lean) protein foods.  Eat fish that is high in omega-3 fats at least two times a week. This includes mackerel, tuna, sardines, and salmon.  Eat foods that are high in fiber, such as whole grains, beans, apples, broccoli, carrots, peas, and barley. General tips   Work with your doctor to lose weight if you need to.  Avoid: ? Foods with added sugar. ? Fried foods. ? Foods with partially hydrogenated oils.  Limit alcohol intake to no more than 1 drink a  day for nonpregnant women and 2 drinks a day for men. One drink equals 12 oz of beer, 5 oz of wine, or 1 oz of hard liquor. Reading food labels  Check food labels for: ? Trans fats. ? Partially hydrogenated oils. ? Saturated fat (g) in each serving. ? Cholesterol (mg) in each serving. ? Fiber (g) in each serving.  Choose foods with healthy fats, such as: ? Monounsaturated fats. ? Polyunsaturated fats. ? Omega-3 fats.  Choose grain products that have whole grains. Look for the word "whole" as the first word in the ingredient list. Cooking  Cook foods using low-fat methods. These include baking, boiling, grilling, and broiling.  Eat more home-cooked foods. Eat at restaurants and buffets less often.  Avoid cooking using saturated fats, such as butter, cream, palm oil, palm kernel oil, and coconut oil. Recommended foods  Fruits  All fresh, canned (in natural juice), or frozen fruits. Vegetables  Fresh or frozen vegetables (raw, steamed, roasted, or grilled). Green salads. Grains  Whole grains, such as whole wheat or whole grain breads, crackers, cereals, and pasta. Unsweetened oatmeal, bulgur, barley, quinoa, or brown rice. Corn or whole wheat flour tortillas. Meats and other protein foods  Ground beef (85% or leaner), grass-fed beef, or beef trimmed of fat. Skinless chicken or [place]. Ground chicken or [place]. Pork trimmed of fat. All fish and seafood. Egg  whites. Dried beans, peas, or lentils. Unsalted nuts or seeds. Unsalted canned beans. Nut butters without added sugar or oil. Dairy  Low-fat or nonfat dairy products, such as skim or 1% milk, 2% or reduced-fat cheeses, low-fat and fat-free ricotta or cottage cheese, or plain low-fat and nonfat yogurt. Fats and oils  Tub margarine without trans fats. Light or reduced-fat mayonnaise and salad dressings. Avocado. Olive, canola, sesame, or safflower oils. The items listed above may not be a complete list of foods and beverages  you can eat. Contact a dietitian for more information. Foods to avoid Fruits  Canned fruit in heavy syrup. Fruit in cream or butter sauce. Fried fruit. Vegetables  Vegetables cooked in cheese, cream, or butter sauce. Fried vegetables. Grains  White bread. White pasta. White rice. Cornbread. Bagels, pastries, and croissants. Crackers and snack foods that contain trans fat and hydrogenated oils. Meats and other protein foods  Fatty cuts of meat. Ribs, chicken wings, bacon, sausage, bologna, salami, chitterlings, fatback, hot dogs, bratwurst, and packaged lunch meats. Liver and organ meats. Whole eggs and egg yolks. Chicken and [place] with skin. Fried meat. Dairy  Whole or 2% milk, cream, half-and-half, and cream cheese. Whole milk cheeses. Whole-fat or sweetened yogurt. Full-fat cheeses. Nondairy creamers and whipped toppings. Processed cheese, cheese spreads, and cheese curds. Beverages  Alcohol. Sugar-sweetened drinks such as sodas, lemonade, and fruit drinks. Fats and oils  Butter, stick margarine, lard, shortening, ghee, or bacon fat. Coconut, palm kernel, and palm oils. Sweets and desserts  Corn syrup, sugars, honey, and molasses. Candy. Jam and jelly. Syrup. Sweetened cereals. Cookies, pies, cakes, donuts, muffins, and ice cream. The items listed above may not be a complete list of foods and beverages you should avoid. Contact a dietitian for more information. Summary  Choosing the right foods helps keep your fat and cholesterol at normal levels. This can keep you from getting certain diseases.  At meals, fill one-half of your plate with vegetables and green salads.  Eat high-fiber foods, like whole grains, beans, apples, carrots, peas, and barley.  Limit added sugar, saturated fats, alcohol, and fried foods. This information is not intended to replace advice given to you by your health care provider. Make sure you discuss any questions you have with your health care  provider. Document Released: 07/03/2011 Document Revised: 09/04/2017 Document Reviewed: 09/18/2016 Elsevier Interactive Patient Education  2019 [employer].

## 2018-03-18 NOTE — Progress Notes (Signed)
Subjective:   LATORI MURTY is a 38 y.o. female who presents for Medicare Annual (Subsequent) preventive examination.  Review of Systems:  Physical assessment deferred to PCP.  Cardiac Risk Factors include: obesity (BMI >30kg/m2)     Objective:    Vitals: BP 120/80   Pulse 65   Temp 98.9 F (37.2 C) (Oral)   Ht 5\' 6"  (1.676 m)   Wt 273 lb 9.6 oz (124.1 kg)   LMP 02/19/2018   SpO2 97%   BMI 44.16 kg/m   Body mass index is 44.16 kg/m.  Advanced Directives 03/18/2018 02/12/2018 12/30/2017 05/13/2017 04/15/2017 04/09/2017 04/04/2017  Does Patient Have a Medical Advance Directive? No No No No No No No  Type of Advance Directive - - - - - - -  Copy of Healthcare Power of Attorney in Chart? - - - - - - -  Would patient like information on creating a medical advance directive? Yes (MAU/Ambulatory/Procedural Areas - Information given) No - Patient declined No - Patient declined No - Patient declined No - Patient declined No - Patient declined No - Patient declined  Pre-existing out of facility DNR order (yellow form or pink MOST form) - - - - - - -    Tobacco Social History   Tobacco Use  Smoking Status Former Smoker  . Packs/day: 0.30  . Years: 0.50  . Pack years: 0.15  . Types: Cigarettes  . Last attempt to quit: 01/15/2001  . Years since quitting: 17.1  Smokeless Tobacco Never Used     Counseling given: Yes   Clinical Intake:  Pre-visit preparation completed: Yes  Pain : No/denies pain Pain Score: 0-No pain     Nutritional Status: BMI > 30  Obese Diabetes: No  How often do you need to have someone help you when you read instructions, pamphlets, or other written materials from your doctor or pharmacy?: 1 - Never What is the last grade level you completed in school?: college - 2 year degree  Interpreter Needed?: No     Past Medical History:  Diagnosis Date  . Asthma     reservations to 2-3 timmes  a year that required ED visits/hospitalizations. Never  intubated  . GERD (gastroesophageal reflux disease)   . Seasonal allergies    Past Surgical History:  Procedure Laterality Date  . CESAREAN SECTION    . CESAREAN SECTION N/A 10/25/2012   Procedure: CESAREAN SECTION;  Surgeon: Dorien Chihuahua. Richardson Dopp, MD;  Location: [facility];  Service: Obstetrics;  Laterality: N/A;  . CESAREAN SECTION N/A 03/05/2014   Procedure: CESAREAN SECTION;  Surgeon: Purcell Nails, MD;  Location: [facility];  Service: Obstetrics;  Laterality: N/A;  . CHOLECYSTECTOMY N/A 04/09/2017   Procedure: LAPAROSCOPIC CHOLECYSTECTOMY;  Surgeon: Axel Filler, MD;  Location: [facility] OR;  Service: General;  Laterality: N/A;  . FRACTURE SURGERY     left middle finger   Family History  Problem Relation Age of Onset  . Hypertension Mother   . Allergies Mother   . Asthma Father   . Sleep apnea Father   . Hyperlipidemia Father   . Emphysema Maternal Grandfather   . Allergies Maternal Grandmother   . Asthma Maternal Grandmother    Social History   Socioeconomic History  . Marital status: Single    Spouse name: Not on file  . Number of children: 3  . Years of education: 85  . Highest education level: Associate degree: occupational, Scientist, product/process development, or vocational program  Occupational History  .  Occupation: disabled    Associate Professor: UNEMPLOYED    Comment: chronic asthma  Social Needs  . Financial resource strain: Not hard at all  . Food insecurity:    Worry: Never true    Inability: Never true  . Transportation needs:    Medical: No    Non-medical: No  Tobacco Use  . Smoking status: Former Smoker    Packs/day: 0.30    Years: 0.50    Pack years: 0.15    Types: Cigarettes    Last attempt to quit: 01/15/2001    Years since quitting: 17.1  . Smokeless tobacco: Never Used  Substance and Sexual Activity  . Alcohol use: No    Comment: occasionaly  . Drug use: No  . Sexual activity: Yes    Birth control/protection: None  Lifestyle  . Physical activity:    Days per week: 3 days    Minutes  per session: 60 min  . Stress: Not on file  Relationships  . Social connections:    Talks on phone: More than three times a week    Gets together: More than three times a week    Attends religious service: More than 4 times per year    Active member of club or organization: Yes    Attends meetings of clubs or organizations: More than 4 times per year    Relationship status: Never married  Other Topics Concern  . Not on file  Social History Narrative   Lives with three kids in home. Ages 40, 5, 4. Lives in apartment, ground floor. Smoke alarms present. Has a grab bar at bathtub. Stove has knobs in front for kids.      No pets due to asthma.       Wears seat belt in car.      Likes to read, journal, style hair. Kid's activities. Movies. Bowling.      Like to eat chicken, [place], no red meats. Loves vegetables, rice, bread and sweets. Likes to eat fruit.      Active in church.    Outpatient Encounter Medications as of 03/18/2018  Medication Sig  . albuterol (PROAIR HFA) 108 (90 Base) MCG/ACT inhaler INHALE TWO PUFFS INTO LUNGS EVERY 4 HOURS AS NEEDED FOR WHEEZING AND FOR SHORTNESS OF BREATH (Patient taking differently: Inhale 1-2 puffs into the lungs every 4 (four) hours as needed. INHALE TWO PUFFS INTO LUNGS EVERY 4 HOURS AS NEEDED FOR WHEEZING AND FOR SHORTNESS OF BREATH)  . albuterol (PROVENTIL) (2.5 MG/3ML) 0.083% nebulizer solution Take 3 mLs (2.5 mg total) by nebulization every 6 (six) hours as needed for wheezing or shortness of breath.  . budesonide-formoterol (SYMBICORT) 160-4.5 MCG/ACT inhaler Inhale 2 puffs into the lungs 2 (two) times daily.  . cetirizine (ZYRTEC) 10 MG tablet Take 1 tablet (10 mg total) by mouth daily.  . famotidine (PEPCID) 20 MG tablet Take 1 tablet (20 mg total) by mouth at bedtime as needed for heartburn or indigestion.  . fluticasone (FLONASE) 50 MCG/ACT nasal spray Place 2 sprays into both nostrils 2 (two) times daily.  Marland Kitchen ibuprofen (ADVIL,MOTRIN) 200  MG tablet Take 200 mg by mouth every 6 (six) hours as needed for headache or moderate pain.   . montelukast (SINGULAIR) 10 MG tablet TAKE 10 MG BY MOUTH AT BEDTIME   No facility-administered encounter medications on file as of 03/18/2018.     Activities of Daily Living In your present state of health, do you have any difficulty performing the following activities: 03/18/2018  04/04/2017  Hearing? N N  Vision? N N  Difficulty concentrating or making decisions? N N  Walking or climbing stairs? N N  Dressing or bathing? N N  Doing errands, shopping? N -  Preparing Food and eating ? N -  Using the Toilet? N -  In the past six months, have you accidently leaked urine? N -  Do you have problems with loss of bowel control? N -  Managing your Medications? N -  Managing your Finances? N -  Housekeeping or managing your Housekeeping? N -  Some recent data might be hidden    Patient Care Team: Latrelle DodrillMcIntyre, Brittany J, MD as PCP - General (Family Medicine) Pleasant, Dennard SchaumannFrances H, RN as [facility] Care Management Wert, Charlaine DaltonMichael B, MD as Consulting Physician (Pulmonary Disease) [facility], [facility]    Assessment:   This is a routine wellness examination for Jorja.  Exercise Activities and Dietary recommendations Current Exercise Habits: Home exercise routine, Type of exercise: walking;calisthenics, Time (Minutes): 60, Frequency (Times/Week): 3, Weekly Exercise (Minutes/Week): 180, Intensity: Moderate, Exercise limited by: respiratory conditions(s)  Goals      Patient Stated   . Patient Stated (pt-stated)     Weight loss. Wants to prepare food differently. Baked vs frying.        Fall Risk Fall Risk  03/18/2018 02/12/2018 12/30/2017 05/13/2017 04/15/2017  Falls in the past year? 0 0 0 No No   Is the patient's home free of loose throw rugs in walkways, pet beds, electrical cords, etc?   yes      Grab bars in the bathroom? yes      Handrails on the stairs?   yes       Adequate lighting?   yes   Depression Screen PHQ 2/9 Scores 03/18/2018 02/12/2018 12/30/2017 05/13/2017  PHQ - 2 Score 0 0 0 0     Cognitive Function MMSE - Mini Mental State Exam 03/18/2018  Orientation to time 5  Orientation to Place 5  Registration 3  Attention/ Calculation 5  Recall 3  Language- name 2 objects 2  Language- repeat 1  Language- follow 3 step command 3  Language- read & follow direction 1  Write a sentence 1  Copy design 1  Total score 30     6CIT Screen 03/18/2018  What Year? 0 points  What month? 0 points  What time? 0 points  Count back from 20 0 points  Months in reverse 0 points  Repeat phrase 0 points  Total Score 0    Immunization History  Administered Date(s) Administered  . Influenza Split 12/18/2010  . Influenza Whole 01/27/2010  . Pneumococcal Polysaccharide-23 03/10/1998  . Tdap 05/08/2010, 12/04/2016    Screening Tests Health Maintenance  Topic Date Due  . INFLUENZA VACCINE  05/21/2018 (Originally 08/15/2017)  . PAP SMEAR-Modifier  02/26/2023  . TETANUS/TDAP  12/05/2026  . HIV Screening  Completed    Cancer Screenings: Lung: Low Dose CT Chest recommended if Age 70-80 years, 30 pack-year currently smoking OR have quit w/in 15years. Patient does not qualify. Breast:  Up to date on Mammogram? N/A   Up to date of Bone Density/Dexa? N/A Colorectal: N/A  Additional Screenings: : HIV screening complete     Plan:  All health maintenance up to date. Patient offered a flu vaccine but declined. Advance Directives info given and explained. Referral made for eye exam.   I have personally reviewed and noted the following in the patient's chart:   . Medical  and social history . Use of alcohol, tobacco or illicit drugs  . Current medications and supplements . Functional ability and status . Nutritional status . Physical activity . Advanced directives . List of other physicians . Hospitalizations, surgeries, and ER visits in previous 12  months . Vitals . Screenings to include cognitive, depression, and falls . Referrals and appointments  In addition, I have reviewed and discussed with patient certain preventive protocols, quality metrics, and best practice recommendations. A written personalized care plan for preventive services as well as general preventive health recommendations were provided to patient.     Nigel Mormon, RN  03/18/2018

## 2018-04-01 ENCOUNTER — Ambulatory Visit: Payer: Medicare Other | Admitting: Internal Medicine

## 2018-04-14 ENCOUNTER — Telehealth: Payer: Self-pay

## 2018-04-14 NOTE — Telephone Encounter (Signed)
Pt called nurse line stating she has been having a lot of "arch" discomfort in her left foot. Pt stated she can barely walk or stand. This is a new problem, no obvious injury to her foot. Pt stated she has been taking ibuprofen with minimal relief. Do due to covid will forward to PCP to advise.

## 2018-04-15 MED ORDER — ALBUTEROL SULFATE HFA 108 (90 BASE) MCG/ACT IN AERS: INHALATION_SPRAY | RESPIRATORY_TRACT | 5 refills | Status: DC

## 2018-04-15 NOTE — Telephone Encounter (Signed)
Returned call to patient. For the past 3 days has had pain in left medial foot around the arch. The area has been mildly red and swollen. No preceding injuries. No redness or swelling going up the leg. No fever. She took ibuprofen before going to bed and this morning it's feeling better.  Given COVID pandemic & patient's history of severe asthma I think it is prudent to keep her out of healthcare offices if possible. Since her symptoms are improving will just monitor. Sounds like it could be plantar fasciitis and if need be we could discuss supportive measures and exercises for that over the phone.  Encouraged patient to stay home as much as possible to avoid exposure to COVID. Educated on emphasis of telehealth right now to minimize risk of disease transmission. Advised she call us with any concerns or needs.   Refilled her albuterol inhaler so she has plenty on hand. Patient appreciative.  [name], MD

## 2018-04-15 NOTE — Addendum Note (Signed)
Addended by: [name] on: 04/15/2018 09:10 AM   Modules accepted: Orders

## 2018-05-03 ENCOUNTER — Other Ambulatory Visit: Payer: Self-pay

## 2018-05-03 ENCOUNTER — Encounter (HOSPITAL_COMMUNITY): Payer: Self-pay | Admitting: Family Medicine

## 2018-05-03 ENCOUNTER — Ambulatory Visit (HOSPITAL_COMMUNITY)
Admission: EM | Admit: 2018-05-03 | Discharge: 2018-05-03 | Disposition: A | Discharge status: Home / Self Care | Payer: Medicare Other | Attending: Family Medicine | Admitting: Family Medicine

## 2018-05-03 DIAGNOSIS — R0789 Other chest pain: Secondary | ICD-10-CM | POA: Diagnosis not present

## 2018-05-03 MED ORDER — OXYCODONE-ACETAMINOPHEN 5-325 MG PO TABS: 2.0000 | ORAL_TABLET | ORAL | 0 refills | Status: DC | PRN

## 2018-05-03 MED ORDER — PREDNISONE 20 MG PO TABS: ORAL_TABLET | ORAL | 0 refills | Status: DC

## 2018-05-03 NOTE — ED Provider Notes (Signed)
[facility]    CSN: [id number] Arrival date & time: 05/03/18  1015     History   Chief Complaint Chief Complaint  Patient presents with  . Chest Pain    under left breast in rib cage  . Back Pain    HPI [name] is a 38 y.o. female.   Per pt she had been exercising and about 2 days later her left rib cage began to hurt and radiates to her back feels as if she has pulled something.   The pain came after the exercises she recently performed.  No shortness of breath or fever.     Past Medical History:  Diagnosis Date  . Asthma     reservations to 2-3 timmes  a year that required ED visits/hospitalizations. Never intubated  . GERD (gastroesophageal reflux disease)   . Seasonal allergies     Patient Active Problem List   Diagnosis Date Noted  . Fatigue 07/01/2017  . Vaginal candidiasis 04/15/2017  . RUQ abdominal pain 03/04/2017  . Acute bilateral low back pain with bilateral sciatica 09/10/2016  . Contraception management 03/17/2015  . Large breasts 07/13/2014  . S/P repeat low transverse C-section 03/05/2014  . Coccyx pain 01/03/2014  . Right shoulder pain 07/24/2013  . Pes planus 07/10/2012  . Plantar fasciitis, bilateral 07/10/2012  . Drug abuse, marijuana 01/20/2011  . Moderate persistent allergic asthma without complication 12/28/2009  . CONSTIPATION, CHRONIC 09/10/2008  . COMMON MIGRAINE 03/20/2007  . Morbid obesity (HCC) 05/24/2006  . Allergic rhinitis 03/14/2006  . GASTROESOPHAGEAL REFLUX, NO ESOPHAGITIS 03/14/2006    Past Surgical History:  Procedure Laterality Date  . CESAREAN SECTION    . CESAREAN SECTION N/A 10/25/2012   Procedure: CESAREAN SECTION;  Surgeon: Dorien Chihuahuaara J. Richardson Doppole, MD;  Location: [facility];  Service: Obstetrics;  Laterality: N/A;  . CESAREAN SECTION N/A 03/05/2014   Procedure: CESAREAN SECTION;  Surgeon: Purcell NailsAngela Y Roberts, MD;  Location: [facility];  Service: Obstetrics;  Laterality: N/A;  . CHOLECYSTECTOMY N/A 04/09/2017   Procedure: LAPAROSCOPIC CHOLECYSTECTOMY;  Surgeon: Axel Filleramirez, Armando, MD;  Location: [facility];  Service: General;  Laterality: N/A;  . FRACTURE SURGERY     left middle finger    OB History    Gravida  7   Para  3   Term  3   Preterm  0   AB  4   Living  3     SAB  2   TAB  2   Ectopic  0   Multiple  0   Live Births  3            Home Medications    Prior to Admission medications   Medication Sig Start Date End Date Taking? Authorizing Provider  albuterol (PROAIR HFA) 108 (90 Base) MCG/ACT inhaler INHALE TWO PUFFS INTO LUNGS EVERY 4 HOURS AS NEEDED FOR WHEEZING AND FOR SHORTNESS OF BREATH 04/15/18   Latrelle DodrillMcIntyre, Brittany J, MD  albuterol (PROVENTIL) (2.5 MG/3ML) 0.083% nebulizer solution Take 3 mLs (2.5 mg total) by nebulization every 6 (six) hours as needed for wheezing or shortness of breath. 12/01/17   Wieters, Hallie C, PA-C  budesonide-formoterol (SYMBICORT) 160-4.5 MCG/ACT inhaler Inhale 2 puffs into the lungs 2 (two) times daily. 02/12/18   Westley ChandlerBrown, Carina M, MD  cetirizine (ZYRTEC) 10 MG tablet Take 1 tablet (10 mg total) by mouth daily. 07/17/16   Latrelle DodrillMcIntyre, Brittany J, MD  famotidine (PEPCID) 20 MG tablet Take 1 tablet (20 mg total) by mouth  at bedtime as needed for heartburn or indigestion. 07/01/17   Coral Ceo, NP  fluticasone (FLONASE) 50 MCG/ACT nasal spray Place 2 sprays into both nostrils 2 (two) times daily. 02/12/18   Westley Chandler, MD  ibuprofen (ADVIL,MOTRIN) 200 MG tablet Take 200 mg by mouth every 6 (six) hours as needed for headache or moderate pain.     [provider]  montelukast (SINGULAIR) 10 MG tablet TAKE 10 MG BY MOUTH AT BEDTIME 02/12/18   Westley Chandler, MD  oxyCODONE-acetaminophen (PERCOCET/ROXICET) 5-325 MG tablet Take 2 tablets by mouth every 4 (four) hours as needed for severe pain. 05/03/18   Elvina Sidle, MD  predniSONE (DELTASONE) 20 MG tablet Two daily with food 05/03/18   Elvina Sidle, MD    Family History Family  History  Problem Relation Age of Onset  . Hypertension Mother   . Allergies Mother   . Asthma Father   . Sleep apnea Father   . Hyperlipidemia Father   . Emphysema Maternal Grandfather   . Allergies Maternal Grandmother   . Asthma Maternal Grandmother     Social History Social History   Tobacco Use  . Smoking status: Former Smoker    Packs/day: 0.30    Years: 0.50    Pack years: 0.15    Types: Cigarettes    Last attempt to quit: 01/15/2001    Years since quitting: 17.3  . Smokeless tobacco: Never Used  Substance Use Topics  . Alcohol use: No    Comment: occasionaly  . Drug use: No     Allergies   Iohexol and Moxifloxacin   Review of Systems Review of Systems   Physical Exam Triage Vital Signs ED Triage Vitals [05/03/18 1032]  Enc Vitals Group     BP      Pulse      Resp      Temp      Temp src      SpO2      Weight      Height      Head Circumference      Peak Flow      Pain Score 10     Pain Loc      Pain Edu?      Excl. in GC?    No data found.  Updated Vital Signs BP (!) 146/84 (BP Location: Right Arm)   Pulse 81   Temp 98.7 F (37.1 C) (Oral)   Resp 16   LMP 03/30/2018   SpO2 100%    Physical Exam Vitals signs and nursing note reviewed.  Constitutional:      General: She is in acute distress.     Appearance: She is well-developed. She is obese. She is not ill-appearing.  HENT:     Head: Normocephalic.  Neck:     Musculoskeletal: Normal range of motion and neck supple.  Cardiovascular:     Rate and Rhythm: Normal rate and regular rhythm.     Heart sounds: Normal heart sounds.  Pulmonary:     Effort: Pulmonary effort is normal.     Breath sounds: Normal breath sounds.  Chest:     Chest wall: Tenderness present. No deformity.     Comments: Tender left ribs with no rash Skin:    General: Skin is warm and dry.     Findings: No rash.  Neurological:     General: No focal deficit present.     Mental Status: She is alert.   Psychiatric:  Mood and Affect: Mood is anxious.        Behavior: Behavior is agitated.      UC Treatments / Results  Labs (all labs ordered are listed, but only abnormal results are displayed) Labs Reviewed - No data to display  EKG None  Radiology No results found.  Procedures Procedures (including critical care time)  Medications Ordered in UC Medications - No data to display  Initial Impression / Assessment and Plan / UC Course  I have reviewed the triage vital signs and the nursing notes.  Pertinent labs & imaging results that were available during my care of the patient were reviewed by me and considered in my medical decision making (see chart for details).    Final Clinical Impressions(s) / UC Diagnoses   Final diagnoses:  Chest wall pain   Discharge Instructions   None    ED Prescriptions    Medication Sig Dispense Auth. Provider   predniSONE (DELTASONE) 20 MG tablet Two daily with food 10 tablet Elvina Sidle, MD   oxyCODONE-acetaminophen (PERCOCET/ROXICET) 5-325 MG tablet Take 2 tablets by mouth every 4 (four) hours as needed for severe pain. 10 tablet Elvina Sidle, MD     Controlled Substance Prescriptions [place] Controlled Substance Registry consulted? Not Applicable   Elvina Sidle, MD 05/03/18 1046

## 2018-05-03 NOTE — ED Triage Notes (Signed)
Per pt she had been exercising and about 2 days later her left rib cage and radiates to her back feels as if she has pulled something. the PAIN WAS NOT THERE UNTIL AFTERWARDS. no sob.

## 2018-09-01 ENCOUNTER — Ambulatory Visit: Payer: Medicare Other | Admitting: Internal Medicine

## 2018-10-09 ENCOUNTER — Other Ambulatory Visit: Payer: Self-pay

## 2018-10-09 ENCOUNTER — Telehealth: Payer: Self-pay | Admitting: Family Medicine

## 2018-10-09 ENCOUNTER — Ambulatory Visit (INDEPENDENT_AMBULATORY_CARE_PROVIDER_SITE_OTHER): Payer: Medicare Other | Admitting: Family Medicine

## 2018-10-09 DIAGNOSIS — M62838 Other muscle spasm: Secondary | ICD-10-CM

## 2018-10-09 MED ORDER — NAPROXEN 500 MG PO TABS: 500.0000 mg | ORAL_TABLET | Freq: Two times a day (BID) | ORAL | 0 refills | Status: DC

## 2018-10-09 MED ORDER — METHOCARBAMOL 500 MG PO TABS: 500.0000 mg | ORAL_TABLET | Freq: Four times a day (QID) | ORAL | 0 refills | Status: DC | PRN

## 2018-10-09 MED ORDER — LIDOCAINE 4 % EX PTCH: 1.0000 | MEDICATED_PATCH | Freq: Every day | CUTANEOUS | 0 refills | Status: DC | PRN

## 2018-10-09 NOTE — Telephone Encounter (Signed)
Pt came into office with a form for her PCP to complete for her disability. Pt's last in office apt was on 02-25-18. Forms were placed in red team folder.

## 2018-10-09 NOTE — Progress Notes (Signed)
     Subjective:  HPI: [name] is a 38 y.o. presenting to clinic today to discuss the following:  Neck Pain Patient reports neck pain ongoing for the past two weeks that radiates down to her right shoulder. It is worse at night and described as sharp with assocaited stiffness and difficulty moving her head from side to side. It occurred after a nephew was "hanging out her neck" while they were playing. She woke up the next day with the pain and stiffness. Looking left makes it worse. OTC Ibuprofen and biofreeze have helped some but she is looking for more relief.  She denies changes in vision, headaches, jaw pain, ear pain, or changes in her hearing. No fever or chills.    ROS noted in HPI.    Social History   Tobacco Use  Smoking Status Former Smoker  . Packs/day: 0.30  . Years: 0.50  . Pack years: 0.15  . Types: Cigarettes  . Quit date: 01/15/2001  . Years since quitting: 17.7  Smokeless Tobacco Never Used   Objective: BP (!) 142/82   Pulse 72   SpO2 99%  Vitals and nursing notes reviewed  Physical Exam Gen: Alert and Oriented x 3, NAD Neck: trachea midline, no thyroidmegaly, no LAD; decreased ROM on side bending and rotation at the cervical spine. Normal ROM in flexion but extension is limited. Cervical spine musculature and sternocleidomastoid muscles are tight on palpation. No cervical spinous process tenderness.  CV: RRR, no murmurs, normal S1, S2 split Resp: CTAB, no wheezing, rales, or rhonchi, comfortable work of breathing Ext: no clubbing, cyanosis, or edema Skin: warm, dry, intact, no rashes  Assessment/Plan:  Muscle spasm Most likely an acute muscle spasm brought on by direct over use of the neck muscles caused by her nephew hanging on her neck - Cont OTC biofreeze and topicals - Naproxen scheduled 500mg  BID for 7-10 days - Robaxin 500mg  QID prn - Lidocaine patch for quick relief - F/u as needed  PATIENT EDUCATION PROVIDED: See AVS    Diagnosis  and plan along with any newly prescribed medication(s) were discussed in detail with this patient today. The patient verbalized understanding and agreed with the plan. Patient advised if symptoms worsen return to clinic or ER.    Meds ordered this encounter  Medications  . naproxen (NAPROSYN) 500 MG tablet    Sig: Take 1 tablet (500 mg total) by mouth 2 (two) times daily with a meal.    Dispense:  30 tablet    Refill:  0  . methocarbamol (ROBAXIN) 500 MG tablet    Sig: Take 1 tablet (500 mg total) by mouth every 6 (six) hours as needed for muscle spasms.    Dispense:  30 tablet    Refill:  0  . Lidocaine (HM LIDOCAINE PATCH) 4 % PTCH    Sig: Apply 1 patch topically daily as needed.    Dispense:  15 patch    Refill:  0     Harolyn Rutherford, DO 10/09/2018, 4:50 PM PGY-3 [place]

## 2018-10-09 NOTE — Patient Instructions (Signed)
Muscle Strain A muscle strain is an injury that occurs when a muscle is stretched beyond its normal length. Usually, a small number of muscle fibers are torn when this happens. There are three types of muscle strains. First-degree strains have the least amount of muscle fiber tearing and the least amount of pain. Second-degree and third-degree strains have more tearing and pain. Usually, recovery from muscle strain takes 1-2 weeks. Complete healing normally takes 5-6 weeks. What are the causes? This condition is caused when a sudden, violent force is placed on a muscle and stretches it too far. This may occur with a fall, lifting, or sports. What increases the risk? This condition is more likely to develop in athletes and people who are physically active. What are the signs or symptoms? Symptoms of this condition include:  Pain.  Bruising.  Swelling.  Trouble using the muscle. How is this diagnosed? This condition is diagnosed based on a physical exam and your medical history. Tests may also be done, including an X-ray, ultrasound, or MRI. How is this treated? This condition is initially treated with PRICE therapy. This therapy involves:  Protecting the muscle from being injured again.  Resting the injured muscle.  Icing the injured muscle.  Applying pressure (compression) to the injured muscle. This may be done with a splint or elastic bandage.  Raising (elevating) the injured muscle. Your health care provider may also recommend medicine for pain. Follow these instructions at home: If you have a splint:  Wear the splint as told by your health care provider. Remove it only as told by your health care provider.  Loosen the splint if your fingers or toes tingle, become numb, or turn cold and blue.  Keep the splint clean.  If the splint is not waterproof: ? Do not let it get wet. ? Cover it with a watertight covering when you take a bath or a shower. Managing pain, stiffness,  and swelling   If directed, put ice on the injured area. ? If you have a removable splint, remove it as told by your health care provider. ? Put ice in a plastic bag. ? Place a towel between your skin and the bag. ? Leave the ice on for 20 minutes, 2-3 times a day.  Move your fingers or toes often to avoid stiffness and to lessen swelling.  Raise (elevate) the injured area above the level of your heart while you are sitting or lying down.  Wear an elastic bandage as told by your health care provider. Make sure that it is not too tight. General instructions  Take over-the-counter and prescription medicines only as told by your health care provider.  Restrict your activity and rest the injured muscle as told by your health care provider. Gentle movements may be allowed.  If physical therapy was prescribed, do exercises as told by your health care provider.  Do not put pressure on any part of the splint until it is fully hardened. This may take several hours.  Do not use any products that contain nicotine or tobacco, such as cigarettes and e-cigarettes. These can delay bone healing. If you need help quitting, ask your health care provider.  Ask your health care provider when it is safe to drive if you have a splint.  Keep all follow-up visits as told by your health care provider. This is important. How is this prevented?  Warm up before exercising. This helps to prevent future muscle strains. Contact a health care provider if:    You have more pain or swelling in the injured area. Get help right away if:  You have numbness or tingling or lose a lot of strength in the injured area. Summary  A muscle strain is an injury that occurs when a muscle is stretched beyond its normal length.  This condition is caused when a sudden, violent force is placed on a muscle and stretches it too far.  This condition is initially treated with PRICE therapy, which involves protecting, resting,  icing, compressing, and elevating.  Gentle movements may be allowed. If physical therapy was prescribed, do exercises as told by your health care provider. This information is not intended to replace advice given to you by your health care provider. Make sure you discuss any questions you have with your health care provider. Document Released: 01/01/2005 Document Revised: 12/14/2016 Document Reviewed: 02/08/2016 Elsevier Patient Education  2020 [employer]  

## 2018-10-10 NOTE — Telephone Encounter (Signed)
Reviewed and placed in provider's box for completion.  Ozella Almond, [place]

## 2018-10-13 NOTE — Telephone Encounter (Signed)
Pt is calling to check on the status of forms being completed. I informed her that we ask up to 7 days for form completion.   Pt asks that someone calls her when they are finished and ready to be picked up at the front desk.

## 2018-10-15 DIAGNOSIS — M62838 Other muscle spasm: Secondary | ICD-10-CM | POA: Insufficient documentation

## 2018-10-15 NOTE — Assessment & Plan Note (Signed)
Most likely an acute muscle spasm brought on by direct over use of the neck muscles caused by her nephew hanging on her neck - Cont OTC biofreeze and topicals - Naproxen scheduled 500mg  BID for 7-10 days - Robaxin 500mg  QID prn - Lidocaine patch for quick relief - F/u as needed

## 2018-10-16 NOTE — Telephone Encounter (Signed)
Called patient. The form she is asking be completed is a Lawyer forgiveness form that requires me to certify that she can never do ANY gainful employment of ANY type. I am not able to certify that. She is cognitively intact and could potentially work in some capacity. Explained this to patient. Encouraged her to try alternate means of documentation (she can show evidence that she receives SSDI or SSI) as outlined on the form. Will place uncompleted forms at front desk for patient to pick up. Patient understanding and appreciative.  Leeanne Rio, MD

## 2019-01-06 ENCOUNTER — Ambulatory Visit: Payer: Medicare Other | Attending: Internal Medicine

## 2019-01-06 DIAGNOSIS — U071 COVID-19: Secondary | ICD-10-CM | POA: Diagnosis not present

## 2019-01-06 DIAGNOSIS — R238 Other skin changes: Secondary | ICD-10-CM | POA: Diagnosis not present

## 2019-01-08 LAB — NOVEL CORONAVIRUS, NAA: SARS-CoV-2, NAA: NOT DETECTED

## 2019-01-28 ENCOUNTER — Ambulatory Visit (INDEPENDENT_AMBULATORY_CARE_PROVIDER_SITE_OTHER): Payer: Medicare Other | Admitting: Family Medicine

## 2019-01-28 ENCOUNTER — Encounter: Payer: Self-pay | Admitting: Family Medicine

## 2019-01-28 ENCOUNTER — Other Ambulatory Visit: Payer: Self-pay

## 2019-01-28 VITALS — BP 124/86 | HR 75 | Wt 279.2 lb

## 2019-01-28 DIAGNOSIS — K279 Peptic ulcer, site unspecified, unspecified as acute or chronic, without hemorrhage or perforation: Secondary | ICD-10-CM | POA: Diagnosis not present

## 2019-01-28 DIAGNOSIS — G8929 Other chronic pain: Secondary | ICD-10-CM | POA: Diagnosis not present

## 2019-01-28 DIAGNOSIS — R1012 Left upper quadrant pain: Secondary | ICD-10-CM | POA: Diagnosis not present

## 2019-01-28 MED ORDER — PANTOPRAZOLE SODIUM 40 MG PO TBEC: 40.0000 mg | DELAYED_RELEASE_TABLET | Freq: Every day | ORAL | 0 refills | Status: DC

## 2019-01-28 NOTE — Patient Instructions (Addendum)
It was a pleasure meeting you today.  You were seen for abdominal pain.  Avoid Advil, Ibuprofen, Naproxen, Excedrin or Diclofenac  Start Protonix 40mg  one tablet daily. You can also add Mirlax to help with bowel movements  I will refer you to GI specialist, they will call you with an appointment.   Follow up with your PCP as needed  MD   Peptic Ulcer  A peptic ulcer is a painful sore in the lining of your stomach or the first part of your small intestine. What are the causes? Common causes of this condition include:  An infection.  Using certain pain medicines too often or too much. What increases the risk? You are more likely to get this condition if you:  Smoke.  Have a family history of ulcer disease.  Drink alcohol.  Have been hospitalized in an intensive care unit (ICU). What are the signs or symptoms? Symptoms include:  Burning pain in the area between the chest and the belly button. The pain may: ? Not go away (be persistent). ? Be worse when your stomach is empty. ? Be worse at night.  Heartburn.  Feeling sick to your stomach (nauseous) and throwing up (vomiting).  Bloating. If the ulcer results in bleeding, it can cause you to:  Have poop (stool) that is black and looks like tar.  Throw up bright red blood.  Throw up material that looks like coffee grounds. How is this treated? Treatment for this condition may include:  Stopping things that can cause the ulcer, such as: ? Smoking. ? Using pain medicines.  Medicines to reduce stomach acid.  Antibiotic medicines if the ulcer is caused by an infection.  A procedure that is done using a small, flexible tube that has a camera at the end (upper endoscopy). This may be done if you have a bleeding ulcer.  Surgery. This may be needed if: ? You have a lot of bleeding. ? The ulcer caused a hole somewhere in the digestive system. Follow these instructions at home:  Do not drink alcohol  if your doctor tells you not to drink.  Limit how much caffeine you take in.  Do not use any products that contain nicotine or tobacco, such as cigarettes, e-cigarettes, and chewing tobacco. If you need help quitting, ask your doctor.  Take over-the-counter and prescription medicines only as told by your doctor. ? Do not stop or change your medicines unless you talk with your doctor about it first. ? Do not take aspirin, ibuprofen, or other NSAIDs unless your doctor told you to do so.  Keep all follow-up visits as told by your doctor. This is important. Contact a doctor if:  You do not get better in 7 days after you start treatment.  You keep having an upset stomach (indigestion) or heartburn. Get help right away if:  You have sudden, sharp pain in your belly (abdomen).  You have belly pain that does not go away.  You have bloody poop (stool) or black, tarry poop.  You throw up blood. It may look like coffee grounds.  You feel light-headed or feel like you may pass out (faint).  You get weak.  You get sweaty or feel sticky and cold to the touch (clammy). Summary  Symptoms of a peptic ulcer include burning pain in the area between the chest and the belly button.  Take medicines only as told by your doctor.  Limit how much alcohol and caffeine you have.  Keep  all follow-up visits as told by your doctor. This information is not intended to replace advice given to you by your health care provider. Make sure you discuss any questions you have with your health care provider. Document Revised: 07/09/2017 Document Reviewed: 07/09/2017 Elsevier Patient Education  [place].

## 2019-01-28 NOTE — Progress Notes (Signed)
  Patient Name: [name] Date of Birth: 1980-05-04 Date of Visit: 01/28/19 PCP: [name], MD  Chief Complaint: pain in stomach  Subjective: [name] is a pleasant 38 y.o. with medical history significant for GERD, Cholecystectomy 2019, Asthma and Seasonal Allergies presenting today for abdominal pain.   Abdominal Pain Pt reports left side abdominal pain that has been ongoing since 2019.  She states that sometimes the pain occurs an hour after she eats it is very sporadic and not specific to any types of foods eaten.  She reports that the pain is intermittent and starts as cramping like a feeling of gas but worsen to a point that she is in a fetal position. She has tried Rolaids, Tums without relief, and GasX that initially help but has since stopped working.  She reports the pain about 24 hrs and feels better after a BM the next day.  She also reports having taken ibuprofen 200 mg 3 tablets every 5-6 hours with little relief.  Denies any chest pain, shortness of breath nausea or vomiting.  Denies any difficulty with urination, low back pain, diarrhea or hematochezia.    ROS: Per HPI.   I have reviewed the patient's medical, surgical, family, and social history as appropriate.  Vitals:   01/28/19 1628  BP: 124/86  Pulse: 75  SpO2: 100%    Abdomen: Obese, soft, nontender, nondistended.  Bowel sounds present.  No organomegaly.  Peptic ulcer disease Considering differentials PUD, diverticulitis, constipation.  Seems likely peptic ulcer disease given the patient has history of GERD and usage of high-dose NSAIDs. -Avoid all NSAID-containing products -Start Protonix 40 mg daily -Referral to GI for further evaluation. -Follow-up with PCP as needed.    Dana Allan, MD  Family Medicine Teaching Service

## 2019-01-29 ENCOUNTER — Encounter: Payer: Self-pay | Admitting: Physician Assistant

## 2019-02-01 DIAGNOSIS — K279 Peptic ulcer, site unspecified, unspecified as acute or chronic, without hemorrhage or perforation: Secondary | ICD-10-CM | POA: Insufficient documentation

## 2019-02-01 NOTE — Assessment & Plan Note (Signed)
Considering differentials PUD, diverticulitis, constipation.  Seems likely peptic ulcer disease given the patient has history of GERD and usage of high-dose NSAIDs. -Avoid all NSAID-containing products -Start Protonix 40 mg daily -Referral to GI for further evaluation. -Follow-up with PCP as needed.

## 2019-02-05 ENCOUNTER — Other Ambulatory Visit: Payer: Medicare Other

## 2019-02-05 ENCOUNTER — Other Ambulatory Visit (INDEPENDENT_AMBULATORY_CARE_PROVIDER_SITE_OTHER): Payer: Medicare Other

## 2019-02-05 ENCOUNTER — Ambulatory Visit (INDEPENDENT_AMBULATORY_CARE_PROVIDER_SITE_OTHER): Payer: Medicare Other | Admitting: Physician Assistant

## 2019-02-05 ENCOUNTER — Encounter: Payer: Self-pay | Admitting: Physician Assistant

## 2019-02-05 VITALS — BP 128/80 | HR 90 | Temp 97.6°F | Ht 66.0 in | Wt 276.0 lb

## 2019-02-05 DIAGNOSIS — R1012 Left upper quadrant pain: Secondary | ICD-10-CM

## 2019-02-05 LAB — CBC WITH DIFFERENTIAL/PLATELET
Basophils Absolute: 0 10*3/uL (ref 0.0–0.1)
Basophils Relative: 0.6 % (ref 0.0–3.0)
Eosinophils Absolute: 0.2 10*3/uL (ref 0.0–0.7)
Eosinophils Relative: 3.1 % (ref 0.0–5.0)
HCT: 35.5 % — ABNORMAL LOW (ref 36.0–46.0)
Hemoglobin: 11.4 g/dL — ABNORMAL LOW (ref 12.0–15.0)
Lymphocytes Relative: 30.6 % (ref 12.0–46.0)
Lymphs Abs: 2 10*3/uL (ref 0.7–4.0)
MCHC: 32.2 g/dL (ref 30.0–36.0)
MCV: 77.1 fl — ABNORMAL LOW (ref 78.0–100.0)
Monocytes Absolute: 0.5 10*3/uL (ref 0.1–1.0)
Monocytes Relative: 7.3 % (ref 3.0–12.0)
Neutro Abs: 3.8 10*3/uL (ref 1.4–7.7)
Neutrophils Relative %: 58.4 % (ref 43.0–77.0)
Platelets: 352 10*3/uL (ref 150.0–400.0)
RBC: 4.61 Mil/uL (ref 3.87–5.11)
RDW: 15.9 % — ABNORMAL HIGH (ref 11.5–15.5)
WBC: 6.5 10*3/uL (ref 4.0–10.5)

## 2019-02-05 LAB — BASIC METABOLIC PANEL
BUN: 11 mg/dL (ref 6–23)
CO2: 24 mEq/L (ref 19–32)
Calcium: 9.4 mg/dL (ref 8.4–10.5)
Chloride: 106 mEq/L (ref 96–112)
Creatinine, Ser: 0.79 mg/dL (ref 0.40–1.20)
GFR: 98.64 mL/min (ref >60.00)
Glucose, Bld: 93 mg/dL (ref 70–99)
Potassium: 3.8 mEq/L (ref 3.5–5.1)
Sodium: 138 mEq/L (ref 135–145)

## 2019-02-05 LAB — SEDIMENTATION RATE: Sed Rate: 110 mm/hr — ABNORMAL HIGH (ref 0–20)

## 2019-02-05 MED ORDER — HYOSCYAMINE SULFATE 0.125 MG SL SUBL: 0.1250 mg | SUBLINGUAL_TABLET | SUBLINGUAL | 2 refills | Status: DC | PRN

## 2019-02-05 NOTE — Progress Notes (Signed)
Subjective:    Patient ID: [name], female    DOB: 06-27-1980, 39 y.o.   MRN: [id number]  HPI [name] is a pleasant 39 year old female, new to GI today referred by [facility]/Tonya Wells for evaluation of left-sided abdominal pain. Patient is status post cholecystectomy in 2019, has had C-section x3 and has history of asthma and obesity. No prior GI evaluation. Patient says she has been having difficulty with intermittent sharp left mid quadrant pain ever since she had her gallbladder removed.  She says this starts out as a crampy type sensation that then escalates to a sharp pain.  These episodes are random and she has been unable to figure out what triggers them.  She feels she is having at least 1 episode per week.  Last night she had a long episode of pain.  She says she will usually wind up taking Tylenol, goes to bed with pain and when she wakes up she is better. She says most episodes last multiple hours.  She generally does not have any nausea or vomiting, no associated fever or chills.  Sometimes she will have an urge for a bowel movement and if she does move her bowels this seems to decrease the intensity of the pain but does not relieve it.  Pain is usually in the left mid the left upper quadrant without radiation.  Bowel movements in between these episodes are normal, no melena or hematochezia.  Review of Systems Pertinent positive and negative review of systems were noted in the above HPI section.  All other review of systems was otherwise negative.  Outpatient Encounter Medications as of 02/05/2019  Medication Sig  . albuterol (PROAIR HFA) 108 (90 Base) MCG/ACT inhaler INHALE TWO PUFFS INTO LUNGS EVERY 4 HOURS AS NEEDED FOR WHEEZING AND FOR SHORTNESS OF BREATH  . albuterol (PROVENTIL) (2.5 MG/3ML) 0.083% nebulizer solution Take 3 mLs (2.5 mg total) by nebulization every 6 (six) hours as needed for wheezing or shortness of breath.  . budesonide-formoterol (SYMBICORT)  160-4.5 MCG/ACT inhaler Inhale 2 puffs into the lungs 2 (two) times daily.  . cetirizine (ZYRTEC) 10 MG tablet Take 1 tablet (10 mg total) by mouth daily.  . fluticasone (FLONASE) 50 MCG/ACT nasal spray Place 2 sprays into both nostrils 2 (two) times daily.  . montelukast (SINGULAIR) 10 MG tablet TAKE 10 MG BY MOUTH AT BEDTIME  . pantoprazole (PROTONIX) 40 MG tablet Take 1 tablet (40 mg total) by mouth daily.  . hyoscyamine (LEVSIN SL) 0.125 MG SL tablet Place 1 tablet (0.125 mg total) under the tongue every 4 (four) hours as needed.  . [DISCONTINUED] famotidine (PEPCID) 20 MG tablet Take 1 tablet (20 mg total) by mouth at bedtime as needed for heartburn or indigestion.  . [DISCONTINUED] ibuprofen (ADVIL,MOTRIN) 200 MG tablet Take 200 mg by mouth every 6 (six) hours as needed for headache or moderate pain.   . [DISCONTINUED] Lidocaine (HM LIDOCAINE PATCH) 4 % PTCH Apply 1 patch topically daily as needed.  . [DISCONTINUED] methocarbamol (ROBAXIN) 500 MG tablet Take 1 tablet (500 mg total) by mouth every 6 (six) hours as needed for muscle spasms.  . [DISCONTINUED] naproxen (NAPROSYN) 500 MG tablet Take 1 tablet (500 mg total) by mouth 2 (two) times daily with a meal.  . [DISCONTINUED] oxyCODONE-acetaminophen (PERCOCET/ROXICET) 5-325 MG tablet Take 2 tablets by mouth every 4 (four) hours as needed for severe pain.  . [DISCONTINUED] predniSONE (DELTASONE) 20 MG tablet Two daily with food   No facility-administered  encounter medications on file as of 02/05/2019.   Allergies  Allergen Reactions  . Iohexol Hives and Itching       . Moxifloxacin Hives and Itching   Patient Active Problem List   Diagnosis Date Noted  . Peptic ulcer disease 02/01/2019  . Muscle spasm 10/15/2018  . Fatigue 07/01/2017  . Vaginal candidiasis 04/15/2017  . RUQ abdominal pain 03/04/2017  . Acute bilateral low back pain with bilateral sciatica 09/10/2016  . Contraception management 03/17/2015  . Large breasts  07/13/2014  . S/P repeat low transverse C-section 03/05/2014  . Coccyx pain 01/03/2014  . Right shoulder pain 07/24/2013  . Pes planus 07/10/2012  . Plantar fasciitis, bilateral 07/10/2012  . Drug abuse, marijuana 01/20/2011  . Moderate persistent allergic asthma without complication 55/73/2202  . CONSTIPATION, CHRONIC 09/10/2008  . COMMON MIGRAINE 03/20/2007  . Morbid obesity ([place]) 05/24/2006  . Allergic rhinitis 03/14/2006  . GASTROESOPHAGEAL REFLUX, NO ESOPHAGITIS 03/14/2006   Social History   Socioeconomic History  . Marital status: Single    Spouse name: Not on file  . Number of children: 3  . Years of education: 72  . Highest education level: Associate degree: occupational, Hotel manager, or vocational program  Occupational History  . Occupation: cosmetologist  Tobacco Use  . Smoking status: Former Smoker    Packs/day: 0.30    Years: 0.50    Pack years: 0.15    Types: Cigarettes    Quit date: 01/15/2001    Years since quitting: 18.0  . Smokeless tobacco: Never Used  Substance and Sexual Activity  . Alcohol use: No    Comment: occasionaly  . Drug use: No  . Sexual activity: Yes    Birth control/protection: None  Other Topics Concern  . Not on file  Social History Narrative   Lives with three kids in home. Ages 21, 38, 30. Lives in apartment, ground floor. Smoke alarms present. Has a grab bar at bathtub. Stove has knobs in front for kids.      No pets due to asthma.       Wears seat belt in car.      Likes to read, journal, style hair. Kid's activities. Movies. Bowling.      Like to eat chicken, [place], no red meats. Loves vegetables, rice, bread and sweets. Likes to eat fruit.      Active in church.   Social Determinants of Health   Financial Resource Strain: Low Risk   . Difficulty of Paying Living Expenses: Not hard at all  Food Insecurity: No Food Insecurity  . Worried About Charity fundraiser in the Last Year: Never true  . Ran Out of Food in the Last  Year: Never true  Transportation Needs: No Transportation Needs  . Lack of Transportation (Medical): No  . Lack of Transportation (Non-Medical): No  Physical Activity: Sufficiently Active  . Days of Exercise per Week: 3 days  . Minutes of Exercise per Session: 60 min  Stress:   . Feeling of Stress : Not on file  Social Connections: Slightly Isolated  . Frequency of Communication with Friends and Family: More than three times a week  . Frequency of Social Gatherings with Friends and Family: More than three times a week  . Attends Religious Services: More than 4 times per year  . Active Member of Clubs or Organizations: Yes  . Attends Archivist Meetings: More than 4 times per year  . Marital Status: Never married  Intimate Partner Violence: Not  At Risk  . Fear of Current or Ex-Partner: No  . Emotionally Abused: No  . Physically Abused: No  . Sexually Abused: No    [name]'s family history includes Allergies in her maternal grandmother and mother; Asthma in her father and maternal grandmother; Diabetes in her maternal grandmother; Emphysema in her maternal grandfather; Hyperlipidemia in her father; Hypertension in her mother; Sleep apnea in her father.      Objective:    Vitals:   02/05/19 1015  BP: 128/80  Pulse: 90  Temp: 97.6 F (36.4 C)    Physical Exam Well-developed well-nourished female in no acute distress.   Weight 276, BMI 44.5  HEENT; nontraumatic normocephalic, EOMI, PER R LA, sclera anicteric. Oropharynx; not examined/mask/Covid Neck; supple, no JVD Cardiovascular; regular rate and rhythm with S1-S2, no murmur rub or gallop Pulmonary; Clear bilaterally Abdomen; soft, obese, nontender, nondistended, no palpable mass or hepatosplenomegaly, bowel sounds are active Rectal; not done today skin; benign exam, no jaundice rash or appreciable lesions Extremities; no clubbing cyanosis or edema skin warm and dry Neuro/Psych; alert and oriented x4, grossly  nonfocal mood and affect appropriate       Assessment & Plan:   #76 39 year old female status post cholecystectomy 2019 with intermittent episodes of left upper quadrant/left mid quadrant sharp crampy abdominal pain which lasted for multiple hours and is occurring 3-4 times per month.  No associated nausea or vomiting, no associated diarrhea melena or hematochezia.  Etiology of these episodes is not clear, rule out secondary to adhesions, partial low-grade bowel obstruction, intussusception. rule out colonic/splenic flexure spasm.  #2 asthma #3.  Obesity #4.  Status post C-section x3  Plan; CBC, be met, sed rate Schedule for CT of the abdomen pelvis with oral but no IV contrast as she had prior reaction with diffuse pruritus. Trial of Levsin sublingual every 4 hours as needed. Further recommendations pending above.  Patient will be established with Dr. Henrene Pastor.  Niya Behler Genia Harold PA-C 02/05/2019   Cc: Dickie La, MD

## 2019-02-05 NOTE — Progress Notes (Signed)
Evaluation and plans noted

## 2019-02-05 NOTE — Patient Instructions (Signed)
You have been scheduled for a CT scan of the abdomen and pelvis At [facility] are scheduled on 02/12/2019 at 3:30pm. You should arrive 15 minutes prior to your appointment time for registration. Please follow the written instructions below on the day of your exam:  WARNING: IF YOU ARE ALLERGIC TO IODINE/X-RAY DYE, PLEASE NOTIFY RADIOLOGY IMMEDIATELY AT [phone]! YOU WILL BE GIVEN A 13 HOUR PREMEDICATION PREP.  1) Do not eat or drink anything after 11:30am (4 hours prior to your test) 2) You have been given 2 bottles of oral contrast to drink. The solution may taste better if refrigerated, but do NOT add ice or any other liquid to this solution. Shake well before drinking.    Drink 1 bottle of contrast @ 1:30pm (2 hours prior to your exam)  Drink 1 bottle of contrast @ 2:30pm (1 hour prior to your exam)  You may take any medications as prescribed with a small amount of water, if necessary. If you take any of the following medications: METFORMIN, GLUCOPHAGE, GLUCOVANCE, AVANDAMET, RIOMET, FORTAMET, [place] MET, JANUMET, GLUMETZA or METAGLIP, you MAY be asked to HOLD this medication 48 hours AFTER the exam.  The purpose of you drinking the oral contrast is to aid in the visualization of your intestinal tract. The contrast solution may cause some diarrhea. Depending on your individual set of symptoms, you may also receive an intravenous injection of x-ray contrast/dye. Plan on being at [facility] for 30 minutes or longer, depending on the type of exam you are having performed.  This test typically takes 30-45 minutes to complete.  If you have any questions regarding your exam or if you need to reschedule, you may call the CT department at [phone] between the hours of 8:00 am and 5:00 pm, Monday-Friday.  ________________________________________________________________________   If you are age 38 or younger, your body mass index should be between 19-25. Your Body mass  index is 44.55 kg/m. If this is out of the aformentioned range listed, please consider follow up with your Primary Care Provider.   We have sent the following medications to your pharmacy for you to pick up at your convenience: [facility] provider has requested that you go to the basement level for lab work before leaving today. Press "B" on the elevator. The lab is located at the first door on the left as you exit the elevator.  Thank you for choosing me and [facility].  Amy Esterwood-PA

## 2019-02-09 NOTE — Progress Notes (Signed)
Please let patient know her labs showed mild anemia which is chronic for her, hemoglobin 11.4, she may be iron deficient.  Would like her to come back for iron studies.Sed rate is also significantly elevated which is a nonspecific marker for inflammation.  She is scheduled for CT of the abdomen and pelvis, will wait for CT, then decide if further work-up indicated.

## 2019-02-10 ENCOUNTER — Other Ambulatory Visit: Payer: Self-pay

## 2019-02-10 DIAGNOSIS — D649 Anemia, unspecified: Secondary | ICD-10-CM

## 2019-02-12 ENCOUNTER — Ambulatory Visit (HOSPITAL_COMMUNITY)
Admission: RE | Admit: 2019-02-12 | Discharge: 2019-02-12 | Disposition: A | Discharge status: Home / Self Care | Payer: Medicare Other | Source: Ambulatory Visit | Attending: Physician Assistant | Admitting: Physician Assistant

## 2019-02-12 ENCOUNTER — Other Ambulatory Visit: Payer: Self-pay

## 2019-02-12 ENCOUNTER — Other Ambulatory Visit: Payer: Medicare Other

## 2019-02-12 DIAGNOSIS — N200 Calculus of kidney: Secondary | ICD-10-CM | POA: Diagnosis not present

## 2019-02-12 DIAGNOSIS — D649 Anemia, unspecified: Secondary | ICD-10-CM

## 2019-02-12 DIAGNOSIS — R1012 Left upper quadrant pain: Secondary | ICD-10-CM | POA: Diagnosis not present

## 2019-02-12 DIAGNOSIS — K429 Umbilical hernia without obstruction or gangrene: Secondary | ICD-10-CM | POA: Diagnosis not present

## 2019-02-13 LAB — IRON,TIBC AND FERRITIN PANEL
%SAT: 9 % (calc) — ABNORMAL LOW (ref 16–45)
Ferritin: 54 ng/mL (ref 16–154)
Iron: 30 ug/dL — ABNORMAL LOW (ref 40–190)
TIBC: 327 mcg/dL (calc) (ref 250–450)

## 2019-03-12 ENCOUNTER — Ambulatory Visit: Payer: Medicare Other

## 2019-03-16 ENCOUNTER — Encounter: Payer: Self-pay | Admitting: Family Medicine

## 2019-03-16 ENCOUNTER — Other Ambulatory Visit: Payer: Self-pay

## 2019-03-16 ENCOUNTER — Ambulatory Visit (INDEPENDENT_AMBULATORY_CARE_PROVIDER_SITE_OTHER): Payer: Medicare Other | Admitting: Family Medicine

## 2019-03-16 VITALS — BP 132/80 | HR 88 | Wt 280.6 lb

## 2019-03-16 DIAGNOSIS — Z3200 Encounter for pregnancy test, result unknown: Secondary | ICD-10-CM | POA: Diagnosis not present

## 2019-03-16 DIAGNOSIS — J4540 Moderate persistent asthma, uncomplicated: Secondary | ICD-10-CM | POA: Diagnosis not present

## 2019-03-16 LAB — POCT URINE PREGNANCY: Preg Test, Ur: POSITIVE — AB

## 2019-03-16 NOTE — Assessment & Plan Note (Signed)
Doing well on current asthma regimen. Reviewed all medications and confirmed they are all acceptable in pregnancy.

## 2019-03-16 NOTE — Patient Instructions (Signed)
It was great to see you again today!  Congratulations on your pregnancy. You need to take prenatal vitamins each day. Continue to stay away from cigarettes! Also, avoid raw meats and cheeses as well as cat litter.    Schedule an appointment with an OB/GYN to start prenatal care. I recommend [facility]. Have had a great experience with Dr. Juliene Pina.  Call with any questions or concerns.  Be well, Dr. Pollie Meyer    First Trimester of Pregnancy The first trimester of pregnancy is from week 1 until the end of week 13 (months 1 through 3). A week after a sperm fertilizes an egg, the egg will implant on the wall of the uterus. This embryo will begin to develop into a baby. Genes from you and your partner will form the baby. The female genes will determine whether the baby will be a boy or a girl. At 6-8 weeks, the eyes and face will be formed, and the heartbeat can be seen on ultrasound. At the end of 12 weeks, all the baby's organs will be formed. Now that you are pregnant, you will want to do everything you can to have a healthy baby. Two of the most important things are to get good prenatal care and to follow your health care provider's instructions. Prenatal care is all the medical care you receive before the baby's birth. This care will help prevent, find, and treat any problems during the pregnancy and childbirth. Body changes during your first trimester Your body goes through many changes during pregnancy. The changes vary from woman to woman.  You may gain or lose a couple of pounds at first.  You may feel sick to your stomach (nauseous) and you may throw up (vomit). If the vomiting is uncontrollable, call your health care provider.  You may tire easily.  You may develop headaches that can be relieved by medicines. All medicines should be approved by your health care provider.  You may urinate more often. Painful urination may mean you have a bladder infection.  You may develop heartburn  as a result of your pregnancy.  You may develop constipation because certain hormones are causing the muscles that push stool through your intestines to slow down.  You may develop hemorrhoids or swollen veins (varicose veins).  Your breasts may begin to grow larger and become tender. Your nipples may stick out more, and the tissue that surrounds them (areola) may become darker.  Your gums may bleed and may be sensitive to brushing and flossing.  Dark spots or blotches (chloasma, mask of pregnancy) may develop on your face. This will likely fade after the baby is born.  Your menstrual periods will stop.  You may have a loss of appetite.  You may develop cravings for certain kinds of food.  You may have changes in your emotions from day to day, such as being excited to be pregnant or being concerned that something may go wrong with the pregnancy and baby.  You may have more vivid and strange dreams.  You may have changes in your hair. These can include thickening of your hair, rapid growth, and changes in texture. Some women also have hair loss during or after pregnancy, or hair that feels dry or thin. Your hair will most likely return to normal after your baby is born. What to expect at prenatal visits During a routine prenatal visit:  You will be weighed to make sure you and the baby are growing normally.  Your blood  pressure will be taken.  Your abdomen will be measured to track your baby's growth.  The fetal heartbeat will be listened to between weeks 10 and 14 of your pregnancy.  Test results from any previous visits will be discussed. Your health care provider may ask you:  How you are feeling.  If you are feeling the baby move.  If you have had any abnormal symptoms, such as leaking fluid, bleeding, severe headaches, or abdominal cramping.  If you are using any tobacco products, including cigarettes, chewing tobacco, and electronic cigarettes.  If you have any  questions. Other tests that may be performed during your first trimester include:  Blood tests to find your blood type and to check for the presence of any previous infections. The tests will also be used to check for low iron levels (anemia) and protein on red blood cells (Rh antibodies). Depending on your risk factors, or if you previously had diabetes during pregnancy, you may have tests to check for high blood sugar that affects pregnant women (gestational diabetes).  Urine tests to check for infections, diabetes, or protein in the urine.  An ultrasound to confirm the proper growth and development of the baby.  Fetal screens for spinal cord problems (spina bifida) and Down syndrome.  HIV (human immunodeficiency virus) testing. Routine prenatal testing includes screening for HIV, unless you choose not to have this test.  You may need other tests to make sure you and the baby are doing well. Follow these instructions at home: Medicines  Follow your health care provider's instructions regarding medicine use. Specific medicines may be either safe or unsafe to take during pregnancy.  Take a prenatal vitamin that contains at least 600 micrograms (mcg) of folic acid.  If you develop constipation, try taking a stool softener if your health care provider approves. Eating and drinking   Eat a balanced diet that includes fresh fruits and vegetables, whole grains, good sources of protein such as meat, eggs, or tofu, and low-fat dairy. Your health care provider will help you determine the amount of weight gain that is right for you.  Avoid raw meat and uncooked cheese. These carry germs that can cause birth defects in the baby.  Eating four or five small meals rather than three large meals a day may help relieve nausea and vomiting. If you start to feel nauseous, eating a few soda crackers can be helpful. Drinking liquids between meals, instead of during meals, also seems to help ease nausea and  vomiting.  Limit foods that are high in fat and processed sugars, such as fried and sweet foods.  To prevent constipation: ? Eat foods that are high in fiber, such as fresh fruits and vegetables, whole grains, and beans. ? Drink enough fluid to keep your urine clear or pale yellow. Activity  Exercise only as directed by your health care provider. Most women can continue their usual exercise routine during pregnancy. Try to exercise for 30 minutes at least 5 days a week. Exercising will help you: ? Control your weight. ? Stay in shape. ? Be prepared for labor and delivery.  Experiencing pain or cramping in the lower abdomen or lower back is a good sign that you should stop exercising. Check with your health care provider before continuing with normal exercises.  Try to avoid standing for long periods of time. Move your legs often if you must stand in one place for a long time.  Avoid heavy lifting.  Wear low-heeled shoes and  practice good posture.  You may continue to have sex unless your health care provider tells you not to. Relieving pain and discomfort  Wear a good support bra to relieve breast tenderness.  Take warm sitz baths to soothe any pain or discomfort caused by hemorrhoids. Use hemorrhoid cream if your health care provider approves.  Rest with your legs elevated if you have leg cramps or low back pain.  If you develop varicose veins in your legs, wear support hose. Elevate your feet for 15 minutes, 3-4 times a day. Limit salt in your diet. Prenatal care  Schedule your prenatal visits by the twelfth week of pregnancy. They are usually scheduled monthly at first, then more often in the last 2 months before delivery.  Write down your questions. Take them to your prenatal visits.  Keep all your prenatal visits as told by your health care provider. This is important. Safety  Wear your seat belt at all times when driving.  Make a list of emergency phone numbers,  including numbers for family, friends, the hospital, and police and fire departments. General instructions  Ask your health care provider for a referral to a local prenatal education class. Begin classes no later than the beginning of month 6 of your pregnancy.  Ask for help if you have counseling or nutritional needs during pregnancy. Your health care provider can offer advice or refer you to specialists for help with various needs.  Do not use hot tubs, steam rooms, or saunas.  Do not douche or use tampons or scented sanitary pads.  Do not cross your legs for long periods of time.  Avoid cat litter boxes and soil used by cats. These carry germs that can cause birth defects in the baby and possibly loss of the fetus by miscarriage or stillbirth.  Avoid all smoking, herbs, alcohol, and medicines not prescribed by your health care provider. Chemicals in these products affect the formation and growth of the baby.  Do not use any products that contain nicotine or tobacco, such as cigarettes and e-cigarettes. If you need help quitting, ask your health care provider. You may receive counseling support and other resources to help you quit.  Schedule a dentist appointment. At home, brush your teeth with a soft toothbrush and be gentle when you floss. Contact a health care provider if:  You have dizziness.  You have mild pelvic cramps, pelvic pressure, or nagging pain in the abdominal area.  You have persistent nausea, vomiting, or diarrhea.  You have a bad smelling vaginal discharge.  You have pain when you urinate.  You notice increased swelling in your face, hands, legs, or ankles.  You are exposed to fifth disease or chickenpox.  You are exposed to [place] measles (rubella) and have never had it. Get help right away if:  You have a fever.  You are leaking fluid from your vagina.  You have spotting or bleeding from your vagina.  You have severe abdominal cramping or  pain.  You have rapid weight gain or loss.  You vomit blood or material that looks like coffee grounds.  You develop a severe headache.  You have shortness of breath.  You have any kind of trauma, such as from a fall or a car accident. Summary  The first trimester of pregnancy is from week 1 until the end of week 13 (months 1 through 3).  Your body goes through many changes during pregnancy. The changes vary from woman to woman.  You will  have routine prenatal visits. During those visits, your health care provider will examine you, discuss any test results you may have, and talk with you about how you are feeling. This information is not intended to replace advice given to you by your health care provider. Make sure you discuss any questions you have with your health care provider. Document Revised: 12/14/2016 Document Reviewed: 12/14/2015 Elsevier Patient Education  2020 [employer].

## 2019-03-16 NOTE — Progress Notes (Signed)
  Date of Visit: 03/16/2019   HPI:  [name] presents today for a pregnancy test. Had multiple positive tests at home. LMP 01/14/19. Periods were previously regular, and this was a normal period. Was not on any birth control. EDC 10/21/19, making her [redacted]w[redacted]d pregnant today. Denies pelvic pain or vaginal bleeding. Has felt tired and had some back pain but overall doing fine. Wants to establish prenatal care with an OB/GYN office. Her prior OB office ([facility]) no longer takes her insurance ([employer]).  8th pregnancy for her. 3 term c/s deliveries, two of which had GDM. Also 2 first trimester SABs and 2 operative TABs.  History of severe asthma. Taking symbicort 160-4.29mcg 2 puffs twice daily, as well as flonase 2 sprays daily, singulair 10mg  daily, and zyrtec 10mg  daily. Using albuterol very rarely (about 1-2 times a month).  PHYSICAL EXAM: BP 132/80   Pulse 88   Wt 280 lb 9.6 oz (127.3 kg)   SpO2 99%   BMI 45.29 kg/m  Gen: no acute distress, pleasant, cooperative well appearing Psych: normal range of affect, well groomed, speech normal in rate and volume, normal eye contact   ASSESSMENT/PLAN:  Health maintenance:  -declines flu shot today, educated on importance of flu shot in pregnancy  Pregnancy Advised to start PNV General pregnancy advice given Offered for her to come here for her prenatal care, but she prefers to see an OB/GYN. She will schedule with them.   Moderate persistent allergic asthma without complication Doing well on current asthma regimen. Reviewed all medications and confirmed they are all acceptable in pregnancy.  J. , MD [facility]  Greater than 20 minutes were spent on this encounter on the day of service, including pre-visit planning, actual face to face time, coordination of care, and documentation of visit.

## 2019-03-25 LAB — OB RESULTS CONSOLE RUBELLA ANTIBODY, IGM: Rubella: IMMUNE

## 2019-03-25 LAB — OB RESULTS CONSOLE HIV ANTIBODY (ROUTINE TESTING): HIV: NONREACTIVE

## 2019-03-25 LAB — OB RESULTS CONSOLE GC/CHLAMYDIA
Chlamydia: NEGATIVE
Gonorrhea: NEGATIVE

## 2019-03-25 LAB — OB RESULTS CONSOLE ABO/RH: RH Type: POSITIVE

## 2019-03-25 LAB — OB RESULTS CONSOLE RPR: RPR: NONREACTIVE

## 2019-03-25 LAB — OB RESULTS CONSOLE ANTIBODY SCREEN: Antibody Screen: NEGATIVE

## 2019-03-25 LAB — OB RESULTS CONSOLE HEPATITIS B SURFACE ANTIGEN: Hepatitis B Surface Ag: NEGATIVE

## 2019-03-28 ENCOUNTER — Other Ambulatory Visit: Payer: Self-pay

## 2019-03-28 ENCOUNTER — Ambulatory Visit (HOSPITAL_COMMUNITY)
Admission: EM | Admit: 2019-03-28 | Discharge: 2019-03-28 | Disposition: A | Discharge status: Home / Self Care | Payer: Medicare Other | Attending: Emergency Medicine | Admitting: Emergency Medicine

## 2019-03-28 ENCOUNTER — Encounter (HOSPITAL_COMMUNITY): Payer: Self-pay | Admitting: Gynecology

## 2019-03-28 DIAGNOSIS — M5442 Lumbago with sciatica, left side: Secondary | ICD-10-CM | POA: Diagnosis not present

## 2019-03-28 MED ORDER — HYDROCODONE-ACETAMINOPHEN 5-325 MG PO TABS: 1.0000 | ORAL_TABLET | Freq: Four times a day (QID) | ORAL | 0 refills | Status: DC | PRN

## 2019-03-28 MED ORDER — ACETAMINOPHEN 325 MG PO TABS
975.0000 mg | ORAL_TABLET | Freq: Once | ORAL | Status: AC
  Administered 2019-03-28: 975 mg via ORAL

## 2019-03-28 MED ORDER — ACETAMINOPHEN 325 MG PO TABS
ORAL_TABLET | ORAL | Status: AC
  Filled 2019-03-28: qty 3

## 2019-03-28 MED ORDER — CYCLOBENZAPRINE HCL 10 MG PO TABS: 10.0000 mg | ORAL_TABLET | Freq: Three times a day (TID) | ORAL | 0 refills | Status: AC | PRN

## 2019-03-28 NOTE — ED Provider Notes (Signed)
HPI  SUBJECTIVE:  [name]  G8, P3 SAB 2 TAB 2 39 y.o. female who presents with 12 days of posterior left leg and low back pain.  She had this on her visit on 3/1 with her PMD, but states it was not as bad then as it is now.  She describes the pain as constant soreness with intermittent sharp nagging pain that lasts for an unknown amount of time.  She states it started in the back of her leg, and is going radiating through her buttocks to her low back.  She denies trauma, heavy lifting, fevers, urinary complaints, leg weakness, numbness.  No bilateral radicular leg pain/weakness.  She reports tingling in her foot today.  No rash.  No saddle anesthesia urinary or fecal incontinence urinary retention, pain worse at night.  No unintentional weight loss.  She tried taking 200 mg of Tylenol without improvement in her symptoms.  No antipyretic in the past 4 to 6 hours.  No alleviating factors.  Symptoms are worse with bending forward, lifting her left leg, going from sitting to standing, and sitting for prolonged periods of time.  She has a past medical history of asthma GERD low back pain.  No history of sciatica diabetes hypertension cancer multiple myeloma HIV IVDU aortic abdominal aneurysm pyelonephritis nephrolithiasis.  LMP: 02/09/2019.  She is 6 weeks 5 days pregnant today.  PMD: Leeanne Rio, MD She is establishing care with [facility] and infertility Dr. Genia Harold this Wednesday.  She denies vaginal bleeding cramping leakage of fluid and passage of tissue.  She states the pregnancy is going well so far.   Past Medical History:  Diagnosis Date  . Asthma     reservations to 2-3 timmes  a year that required ED visits/hospitalizations. Never intubated  . GERD (gastroesophageal reflux disease)   . Seasonal allergies     Past Surgical History:  Procedure Laterality Date  . CESAREAN SECTION    . CESAREAN SECTION N/A 10/25/2012   Procedure: CESAREAN SECTION;  Surgeon: Maeola Sarah. Landry Mellow, MD;   Location: [place] [facility];  Service: Obstetrics;  Laterality: N/A;  . CESAREAN SECTION N/A 03/05/2014   Procedure: CESAREAN SECTION;  Surgeon: Delice Lesch, MD;  Location: [facility];  Service: Obstetrics;  Laterality: N/A;  . CHOLECYSTECTOMY N/A 04/09/2017   Procedure: LAPAROSCOPIC CHOLECYSTECTOMY;  Surgeon: Ralene Ok, MD;  Location: [facility] OR;  Service: General;  Laterality: N/A;  . FRACTURE SURGERY     left middle finger    Family History  Problem Relation Age of Onset  . Hypertension Mother   . Allergies Mother   . Asthma Father   . Sleep apnea Father   . Hyperlipidemia Father   . Emphysema Maternal Grandfather   . Allergies Maternal Grandmother   . Asthma Maternal Grandmother   . Diabetes Maternal Grandmother     Social History   Tobacco Use  . Smoking status: Former Smoker    Packs/day: 0.30    Years: 0.50    Pack years: 0.15    Types: Cigarettes    Quit date: 01/15/2001    Years since quitting: 18.2  . Smokeless tobacco: Never Used  Substance Use Topics  . Alcohol use: No    Comment: occasionaly  . Drug use: No    No current facility-administered medications for this encounter.  Current Outpatient Medications:  .  albuterol (PROAIR HFA) 108 (90 Base) MCG/ACT inhaler, INHALE TWO PUFFS INTO LUNGS EVERY 4 HOURS AS NEEDED FOR WHEEZING AND FOR  SHORTNESS OF BREATH, Disp: 18 each, Rfl: 5 .  albuterol (PROVENTIL) (2.5 MG/3ML) 0.083% nebulizer solution, Take 3 mLs (2.5 mg total) by nebulization every 6 (six) hours as needed for wheezing or shortness of breath., Disp: 75 mL, Rfl: 12 .  budesonide-formoterol (SYMBICORT) 160-4.5 MCG/ACT inhaler, Inhale 2 puffs into the lungs 2 (two) times daily., Disp: 3 Inhaler, Rfl: 11 .  cetirizine (ZYRTEC) 10 MG tablet, Take 1 tablet (10 mg total) by mouth daily., Disp: 90 tablet, Rfl: 1 .  fluticasone (FLONASE) 50 MCG/ACT nasal spray, Place 2 sprays into both nostrils 2 (two) times daily. (Patient taking differently: Place 2 sprays into both  nostrils daily. ), Disp: 16 g, Rfl: 3 .  montelukast (SINGULAIR) 10 MG tablet, TAKE 10 MG BY MOUTH AT BEDTIME, Disp: 90 tablet, Rfl: 3 .  cyclobenzaprine (FLEXERIL) 10 MG tablet, Take 1 tablet (10 mg total) by mouth 3 (three) times daily as needed for up to 3 days for muscle spasms., Disp: 9 tablet, Rfl: 0 .  HYDROcodone-acetaminophen (NORCO/VICODIN) 5-325 MG tablet, Take 1-2 tablets by mouth every 6 (six) hours as needed for up to 3 days for moderate pain., Disp: 12 tablet, Rfl: 0  Allergies  Allergen Reactions  . Iohexol Hives and Itching       . Moxifloxacin Hives and Itching     ROS  As noted in HPI.   Physical Exam  BP 137/88 (BP Location: Left Arm)   Pulse 80   Resp 18   LMP 02/09/2019   SpO2 100%   Constitutional: Well developed, well nourished, appears very uncomfortable Eyes:  EOMI, conjunctiva normal bilaterally HENT: Normocephalic, atraumatic,mucus membranes moist Respiratory: Normal inspiratory effort Cardiovascular: Normal rate GI: nondistended. No suprapubic tenderness skin: No rash on  foot, skin intact Musculoskeletal: no CVAT. + Left paralumbar tenderness, -appreciable muscle spasm. No bony tenderness L-spine SI joint.  Positive tenderness along the left buttock, sciatic notch.  Sensation intact over entire left leg and foot.  Bilateral lower extremities nontender, baseline ROM with intact PT pulses, CR<2 secs all digits bilaterally. No pain with int/ext rotation  hips bilaterally.  Pain aggravated with left hip flexion against resistance.  SLR positive left side. Sensation baseline light touch bilaterally for Pt, DTR's symmetric and intact bilaterally KJ, Motor symmetric bilateral 5/5 hip flexion, quadriceps, hamstrings, EHL, foot dorsiflexion, foot plantarflexion, gait somewhat antalgic but without apparent new ataxia. Neurologic: Alert & oriented x 3, no focal neuro deficits Psychiatric: Speech and behavior appropriate   ED Course   Medications   acetaminophen (TYLENOL) tablet 975 mg (975 mg Oral Given 03/28/19 1700)    No orders of the defined types were placed in this encounter.   No results found for this or any previous visit (from the past 24 hour(s)). No results found.  ED Clinical Impression  1. Acute left-sided low back pain with left-sided sciatica      ED Assessment/Plan  [place] Narcotic database reviewed for this patient, and feel that the risk/benefit ratio today is favorable for proceeding with a prescription for controlled substance. Pt with no opiate rx since 04/2018.  No historical red flags as noted in HPI. No physical red flags such as fever, bony tenderness, lower extremity weakness, saddle anesthesia. Imaging not indicated at this time.   Discussed with Dr. Charlette Caffey, OB/GYN on-call.  Plan to try 1000 mg of Tylenol 3-4 times a day, Flexeril 10 mg 3 times daily for 3 days, short course of Norco.  Discussed medical decision-making, and plan for  follow-up with the patient.  Discussed signs and symptoms that should prompt return to the emergency department.  Patient agrees with plan.   Meds ordered this encounter  Medications  . acetaminophen (TYLENOL) tablet 975 mg  . cyclobenzaprine (FLEXERIL) 10 MG tablet    Sig: Take 1 tablet (10 mg total) by mouth 3 (three) times daily as needed for up to 3 days for muscle spasms.    Dispense:  9 tablet    Refill:  0  . DISCONTD: HYDROcodone-acetaminophen (NORCO/VICODIN) 5-325 MG tablet    Sig: Take 1-2 tablets by mouth every 6 (six) hours as needed for up to 3 days for moderate pain.    Dispense:  12 tablet    Refill:  0  . HYDROcodone-acetaminophen (NORCO/VICODIN) 5-325 MG tablet    Sig: Take 1-2 tablets by mouth every 6 (six) hours as needed for up to 3 days for moderate pain.    Dispense:  12 tablet    Refill:  0    *This clinic note was created using [employer]. Therefore, there may be occasional mistakes despite careful proofreading.  ?      Melynda Ripple, MD 03/28/19 1724

## 2019-03-28 NOTE — Discharge Instructions (Addendum)
Take a Tylenol containing product 3 or 4 times a day.  You may take either 1000 mg of Tylenol or you may take 1-2 Norco for severe pain.  Do not take Tylenol and Norco at the same time as they both have Tylenol in them and too much Tylenol can hurt your liver.  You can try the Flexeril for short period of time.  Apply heat, gentle stretching.  You may benefit from physical therapy.

## 2019-03-28 NOTE — ED Triage Notes (Signed)
Per patient with lower back pain rotating to her left leg x 1 week ago. Per patient 6.[redacted] weeks pregnant.

## 2019-03-31 ENCOUNTER — Encounter (HOSPITAL_COMMUNITY): Payer: Self-pay | Admitting: Family Medicine

## 2019-03-31 ENCOUNTER — Inpatient Hospital Stay (HOSPITAL_COMMUNITY): Payer: Medicare Other

## 2019-03-31 ENCOUNTER — Other Ambulatory Visit: Payer: Self-pay

## 2019-03-31 ENCOUNTER — Inpatient Hospital Stay (HOSPITAL_COMMUNITY)
Admission: AD | Admit: 2019-03-31 | Discharge: 2019-03-31 | Disposition: A | Discharge status: Home / Self Care | Payer: Medicare Other | Attending: Obstetrics and Gynecology | Admitting: Obstetrics and Gynecology

## 2019-03-31 DIAGNOSIS — O3680X0 Pregnancy with inconclusive fetal viability, not applicable or unspecified: Secondary | ICD-10-CM

## 2019-03-31 DIAGNOSIS — O99891 Other specified diseases and conditions complicating pregnancy: Secondary | ICD-10-CM | POA: Diagnosis not present

## 2019-03-31 DIAGNOSIS — M5442 Lumbago with sciatica, left side: Secondary | ICD-10-CM | POA: Diagnosis not present

## 2019-03-31 DIAGNOSIS — Z87891 Personal history of nicotine dependence: Secondary | ICD-10-CM | POA: Insufficient documentation

## 2019-03-31 DIAGNOSIS — O09522 Supervision of elderly multigravida, second trimester: Secondary | ICD-10-CM | POA: Diagnosis not present

## 2019-03-31 DIAGNOSIS — Z3A01 Less than 8 weeks gestation of pregnancy: Secondary | ICD-10-CM | POA: Diagnosis not present

## 2019-03-31 DIAGNOSIS — R109 Unspecified abdominal pain: Secondary | ICD-10-CM

## 2019-03-31 DIAGNOSIS — R030 Elevated blood-pressure reading, without diagnosis of hypertension: Secondary | ICD-10-CM | POA: Diagnosis not present

## 2019-03-31 DIAGNOSIS — O26891 Other specified pregnancy related conditions, first trimester: Secondary | ICD-10-CM | POA: Diagnosis not present

## 2019-03-31 DIAGNOSIS — M5432 Sciatica, left side: Secondary | ICD-10-CM | POA: Diagnosis not present

## 2019-03-31 MED ORDER — OXYCODONE-ACETAMINOPHEN 5-325 MG PO TABS: 2.0000 | ORAL_TABLET | ORAL | 0 refills | Status: DC | PRN

## 2019-03-31 MED ORDER — OXYCODONE-ACETAMINOPHEN 5-325 MG PO TABS
2.0000 | ORAL_TABLET | Freq: Once | ORAL | Status: AC
  Administered 2019-03-31: 2 via ORAL
  Filled 2019-03-31: qty 2

## 2019-03-31 NOTE — MAU Note (Addendum)
Gerrit Heck CNM in Triage to talk with pt. Did u/s without in Triage and could not determine if pt has iup. Pt to RM 121 for further eval.

## 2019-03-31 NOTE — Progress Notes (Signed)
Wynelle Bourgeois CNM in earlier to discuss test results and d/c plan. Written and verbal d/c instructions given and understanding voiced. U/S pic given to pt

## 2019-03-31 NOTE — Discharge Instructions (Signed)

## 2019-03-31 NOTE — MAU Provider Note (Signed)
History     CSN: [id number]  Arrival date and time: 03/31/19 1910   First Provider Initiated Contact with Patient 03/31/19 2035      No chief complaint on file.  [name] is a 39 y.o. [id number] at [redacted]w[redacted]d by Definite LMP who receives care at [facility].  She presents today for Left side leg and abdominal pain.  Patient states pain has been present for the past 2 weeks, but has gotten progressively worse in the last few days.  Patient states that she was seen in Urgent care and given norco and flexeril, but has not taken the Norco because it "just makes me hot."  Patient reports taking the flexeril only without relief.  Patient reports that she has not had an early pregnancy ultrasound, but denies vaginal bleeding or discharge.       OB History    Gravida  8   Para  3   Term  3   Preterm  0   AB  4   Living  3     SAB  2   TAB  2   Ectopic  0   Multiple  0   Live Births  3           Past Medical History:  Diagnosis Date  . Asthma     reservations to 2-3 timmes  a year that required ED visits/hospitalizations. Never intubated  . GERD (gastroesophageal reflux disease)   . Seasonal allergies     Past Surgical History:  Procedure Laterality Date  . CESAREAN SECTION    . CESAREAN SECTION N/A 10/25/2012   Procedure: CESAREAN SECTION;  Surgeon: Dorien Chihuahua. Richardson Dopp, MD;  Location: [facility];  Service: Obstetrics;  Laterality: N/A;  . CESAREAN SECTION N/A 03/05/2014   Procedure: CESAREAN SECTION;  Surgeon: Purcell Nails, MD;  Location: [facility];  Service: Obstetrics;  Laterality: N/A;  . CHOLECYSTECTOMY N/A 04/09/2017   Procedure: LAPAROSCOPIC CHOLECYSTECTOMY;  Surgeon: Axel Filler, MD;  Location[facility];  Service: General;  Laterality: N/A;  . FRACTURE SURGERY     left middle finger    Family History  Problem Relation Age of Onset  . Hypertension Mother   . Allergies Mother   . Asthma Father   . Sleep apnea Father   . Hyperlipidemia Father   . Emphysema  Maternal Grandfather   . Allergies Maternal Grandmother   . Asthma Maternal Grandmother   . Diabetes Maternal Grandmother     Social History   Tobacco Use  . Smoking status: Former Smoker    Packs/day: 0.30    Years: 0.50    Pack years: 0.15    Types: Cigarettes    Quit date: 01/15/2001    Years since quitting: 18.2  . Smokeless tobacco: Never Used  Substance Use Topics  . Alcohol use: No    Comment: occasionaly  . Drug use: No    Allergies:  Allergies  Allergen Reactions  . Iohexol Hives and Itching       . Moxifloxacin Hives and Itching    Medications Prior to Admission  Medication Sig Dispense Refill Last Dose  . albuterol (PROAIR HFA) 108 (90 Base) MCG/ACT inhaler INHALE TWO PUFFS INTO LUNGS EVERY 4 HOURS AS NEEDED FOR WHEEZING AND FOR SHORTNESS OF BREATH 18 each 5   . albuterol (PROVENTIL) (2.5 MG/3ML) 0.083% nebulizer solution Take 3 mLs (2.5 mg total) by nebulization every 6 (six) hours as needed for wheezing or shortness of breath. 75 mL  12   . budesonide-formoterol (SYMBICORT) 160-4.5 MCG/ACT inhaler Inhale 2 puffs into the lungs 2 (two) times daily. 3 Inhaler 11   . cetirizine (ZYRTEC) 10 MG tablet Take 1 tablet (10 mg total) by mouth daily. 90 tablet 1   . cyclobenzaprine (FLEXERIL) 10 MG tablet Take 1 tablet (10 mg total) by mouth 3 (three) times daily as needed for up to 3 days for muscle spasms. 9 tablet 0   . fluticasone (FLONASE) 50 MCG/ACT nasal spray Place 2 sprays into both nostrils 2 (two) times daily. (Patient taking differently: Place 2 sprays into both nostrils daily. ) 16 g 3   . HYDROcodone-acetaminophen (NORCO/VICODIN) 5-325 MG tablet Take 1-2 tablets by mouth every 6 (six) hours as needed for up to 3 days for moderate pain. 12 tablet 0   . montelukast (SINGULAIR) 10 MG tablet TAKE 10 MG BY MOUTH AT BEDTIME 90 tablet 3     Review of Systems  Gastrointestinal: Positive for abdominal pain (Left Side-Stabbing/Shooting) and nausea. Negative for  vomiting.  Genitourinary: Negative for difficulty urinating, dysuria, vaginal bleeding and vaginal discharge.   Physical Exam   Blood pressure (!) 130/95, pulse 81, temperature 98.6 F (37 C), resp. rate 20, height 5\' 6"  (1.676 m), weight 124.3 kg, last menstrual period 02/09/2019.  Physical Exam  Constitutional: She is oriented to person, place, and time. She appears well-developed and well-nourished. She appears distressed.  Obese Patient with limited mobility s/t pain.  Appears distress, tense, and moves with rigidity.   HENT:  Head: Normocephalic and atraumatic.  Eyes: Conjunctivae are normal.  Cardiovascular: Normal rate, regular rhythm and normal heart sounds.  Respiratory: Effort normal and breath sounds normal.  GI: Soft. There is abdominal tenderness in the left lower quadrant. There is no guarding.  Musculoskeletal:     Cervical back: Normal range of motion.     Left hip: Tenderness present. Decreased range of motion.  Neurological: She is alert and oriented to person, place, and time.  Skin: Skin is warm and dry.  Psychiatric: She has a normal mood and affect. Her behavior is normal.    MAU Course  Procedures No results found for this or any previous visit (from the past 24 hour(s)). 02/11/2019 OB LESS THAN 14 WEEKS WITH OB TRANSVAGINAL  Result Date: 03/31/2019 CLINICAL DATA:  Pain. EXAM: OBSTETRIC <14 WK 04/02/2019 AND TRANSVAGINAL OB US TECHNIQUE: Both transabdominal and transvaginal ultrasound examinations were performed for complete evaluation of the gestation as well as the maternal uterus, adnexal regions, and pelvic cul-de-sac. Transvaginal technique was performed to assess early pregnancy. COMPARISON:  None. FINDINGS: Intrauterine gestational sac: Single Yolk sac:  Visualized. Embryo:  Visualized. Cardiac Activity: Visualized. Heart Rate: 152 bpm CRL: 13.3 mm   7 w   4 d                  [place] EDC: 11/13/2019 Subchorionic hemorrhage:  None visualized. Maternal uterus/adnexae: There is  no significant maternal abnormality. There is a trace amount of pelvic free fluid. IMPRESSION: Single live IUP at 7 weeks and 4 days as detailed above. Electronically Signed   By: 11/15/2019 M.D.   On: 03/31/2019 21:35    MDM Physical Exam Pain medication 04/02/2019 Assessment and Plan  39 year old 30 at 7.1weeks Left Side Pain-Likely Sciatica Pregnancy of Unknown Anatomical Location  -Reviewed POC with patient. -Exam performed and findings discussed.  -Informed that provider would send for [id number] to rule out ectopic. -Will give percocet for pain  and reassess.  Maryann Conners 03/31/2019, 8:51 PM   Reassessment (9:49 PM) SIUP at 7.4 weeks  -In room to discuss [place] results. -Patient reports improvement in pain and appears without distress.  -Will send script for Percocet. -Patient instructed to discontinue and discard Norco. -Will give information in AVS for sciatica pain. -Encouraged to discuss pain with primary ob for potential PT referral. -Patient without questions or concerns. -Encouraged to call primary ob or return to MAU if symptoms worsen or with the onset of new symptoms. -Discharged to home in stable condition.  Maryann Conners MSN, CNM Advanced Practice Provider, [facility] [place] (9:54 PM) -Nurse reports patient blood pressure elevated, but patient contributes to pain and discomfort. -Nurse instructed to have patient inform primary ob tomorrow during visit.  Maryann Conners MSN, CNM Advanced Practice Provider, [facility]

## 2019-03-31 NOTE — MAU Note (Signed)
Having pain in lower back that runs down back of L leg and to foot. Went to Urgent Care Sat and gave me medicine but is not helping. Denies any pregnancy concerns.

## 2019-04-19 ENCOUNTER — Telehealth: Payer: Self-pay | Admitting: Family Medicine

## 2019-04-19 NOTE — Telephone Encounter (Signed)
[facility] Emergency After Hours Line:  Patient calling emergency after-hours line she is having worsening sciatic pain.  Patient is currently [redacted] weeks pregnant.  She denies having these symptoms prior to her pregnancy.  She is treated the pain with Tylenol without relief.  Denies any urinary or fecal incontinence.  Has been referred to physical therapy with an appointment scheduled for 4/22.  Recommended patient schedule an appointment with PCP to be evaluated.  Also recommended heat, ice, massage, home exercises/stretches.  She voiced understanding and agreement to plan.

## 2019-04-20 ENCOUNTER — Other Ambulatory Visit: Payer: Self-pay

## 2019-04-20 ENCOUNTER — Ambulatory Visit (INDEPENDENT_AMBULATORY_CARE_PROVIDER_SITE_OTHER): Payer: Medicare Other | Admitting: Family Medicine

## 2019-04-20 VITALS — BP 134/80 | HR 93 | Wt 264.2 lb

## 2019-04-20 DIAGNOSIS — O99891 Other specified diseases and conditions complicating pregnancy: Secondary | ICD-10-CM | POA: Diagnosis not present

## 2019-04-20 DIAGNOSIS — M549 Dorsalgia, unspecified: Secondary | ICD-10-CM | POA: Diagnosis not present

## 2019-04-20 MED ORDER — DICLOFENAC SODIUM 1 % EX GEL: 2.0000 g | Freq: Four times a day (QID) | CUTANEOUS | 0 refills | Status: AC

## 2019-04-20 NOTE — Patient Instructions (Signed)
Today we talked about your back pain, I do think the most important step is for you to get physical therapy.  You can call a massage therapist as well to see if that might help you.  Please consider wearing supportive shoes see when walking around the house, it is important you try to maintain some level of activity.  Were going to order some Voltaren gel which she can use as a topical ointment which might help.  Continue with ice and heat as tolerated, continue with Tylenol as tolerated.  Please avoid ibuprofen and oral NSAIDs.  Remember if you start having any incontinence or numbness in your groin to please get to a physician immediately  Dr. Parke Simmers

## 2019-04-21 DIAGNOSIS — M549 Dorsalgia, unspecified: Secondary | ICD-10-CM | POA: Insufficient documentation

## 2019-04-21 NOTE — Progress Notes (Signed)
    SUBJECTIVE:   CHIEF COMPLAINT / HPI: Radicular left-sided back pain  Patient states that this back pain came on slowly over the course of 3 to 4 days starting a few days after her March visit with Dr. Pollie Meyer.  She has since been to the emergency department twice for this.  She says that the Flexeril has not been particularly helpful, rotating ice and heat has not been that helpful, she has been prescribed narcotics which she said helped but she does not want to continue because she is concerned for the health of her child.   She has had an ultrasound to confirm IUP, denies any abdominal or pelvic pain, no bleeding.  No injuries and says she is safe at home.  Denies saddle anesthesia or incontinence.  She said there was roughly a day during the middle of March where she felt she was having some paresthesia but says that has completely resolved and she has full sensation at this time.  She does have full muscular control although due to pain she prefers to stay seated if possible although it hurts.  PERTINENT  PMH / PSH: Currently [redacted] weeks pregnant, obesity  OBJECTIVE:   BP 134/80   Pulse 93   Wt 264 lb 3.2 oz (119.8 kg)   LMP 02/09/2019   SpO2 99%   BMI 42.64 kg/m   General: Very uncomfortable but not in distress Respiratory: No increased work of breathing, no cough Cardiac: Regular rate MSK: Does have full sensation and muscular control symmetrically in lower extremities.  No midline bony tenderness on lower back.  Due to pain is unable to perform full battery of mobility testing but is able to walk   ASSESSMENT/PLAN:   Back pain affecting pregnancy in first trimester Patient is clearly uncomfortable but there are no emergency symptoms and IUP has been confirmed at the emergency department.  Emergency precautions were discussed with patient.  We discussed potential of supportive maternity clothing as well as calling the PT referral she already has to try and offer to fill in for  last-minute cancellations.  Also instructed that she could try massage therapy and could potentially find one group on.  Advise scheduling Tylenol, avoiding ibuprofen and NSAIDs, can use rotating ice and heat as tolerated.  Advised to wear supportive shoes in the house and not stand for barefoot long periods of time     Cynthia Rolling, DO [facility]

## 2019-04-21 NOTE — Assessment & Plan Note (Signed)
Patient is clearly uncomfortable but there are no emergency symptoms and IUP has been confirmed at the emergency department.  Emergency precautions were discussed with patient.  We discussed potential of supportive maternity clothing as well as calling the PT referral she already has to try and offer to fill in for last-minute cancellations.  Also instructed that she could try massage therapy and could potentially find one group on.  Advise scheduling Tylenol, avoiding ibuprofen and NSAIDs, can use rotating ice and heat as tolerated.  Advised to wear supportive shoes in the house and not stand for barefoot long periods of time

## 2019-04-22 DIAGNOSIS — M9903 Segmental and somatic dysfunction of lumbar region: Secondary | ICD-10-CM | POA: Diagnosis not present

## 2019-04-22 DIAGNOSIS — M9905 Segmental and somatic dysfunction of pelvic region: Secondary | ICD-10-CM | POA: Diagnosis not present

## 2019-04-22 DIAGNOSIS — M25552 Pain in left hip: Secondary | ICD-10-CM | POA: Diagnosis not present

## 2019-04-22 DIAGNOSIS — M5116 Intervertebral disc disorders with radiculopathy, lumbar region: Secondary | ICD-10-CM | POA: Diagnosis not present

## 2019-04-23 DIAGNOSIS — M5430 Sciatica, unspecified side: Secondary | ICD-10-CM | POA: Diagnosis not present

## 2019-04-23 DIAGNOSIS — J45909 Unspecified asthma, uncomplicated: Secondary | ICD-10-CM | POA: Diagnosis not present

## 2019-04-28 ENCOUNTER — Telehealth: Payer: Self-pay

## 2019-04-28 DIAGNOSIS — O99891 Other specified diseases and conditions complicating pregnancy: Secondary | ICD-10-CM

## 2019-04-28 DIAGNOSIS — M549 Dorsalgia, unspecified: Secondary | ICD-10-CM

## 2019-04-28 NOTE — Telephone Encounter (Signed)
Patient calls nurse line requesting a DME order for a wheelchair. Patient states she has sciatic and feels a wheelchair will be beneficial to her. I offered to schedule her with PCP to discuss, however the patient stated she has been in for this multiple times. Please advise.

## 2019-04-29 DIAGNOSIS — M25552 Pain in left hip: Secondary | ICD-10-CM | POA: Diagnosis not present

## 2019-04-29 DIAGNOSIS — M5417 Radiculopathy, lumbosacral region: Secondary | ICD-10-CM | POA: Diagnosis not present

## 2019-04-29 DIAGNOSIS — M545 Low back pain: Secondary | ICD-10-CM | POA: Diagnosis not present

## 2019-04-29 DIAGNOSIS — M6281 Muscle weakness (generalized): Secondary | ICD-10-CM | POA: Diagnosis not present

## 2019-04-29 DIAGNOSIS — M62830 Muscle spasm of back: Secondary | ICD-10-CM | POA: Diagnosis not present

## 2019-04-29 NOTE — Telephone Encounter (Signed)
Patient calling again to check the status of DME request.

## 2019-05-04 ENCOUNTER — Other Ambulatory Visit: Payer: Self-pay | Admitting: Family Medicine

## 2019-05-04 DIAGNOSIS — M549 Dorsalgia, unspecified: Secondary | ICD-10-CM

## 2019-05-04 NOTE — Addendum Note (Signed)
Addended by: [name] on: 05/04/2019 04:45 PM   Modules accepted: Orders

## 2019-05-04 NOTE — Telephone Encounter (Signed)
Called patient to discuss this request.  Reports 6 weeks of severe unrelenting L sided back pain radiating down the L leg to her toes. Denies bowel/bladder incontinence, fever, saddle anesthesia. Denies L leg weakness, just severely limited due to pain. Has essentially been laying in bed for 6 weeks, per patient.  Reviewed records - she saw UC and also went to [facility] for assessment, additionally saw Dr. Parke Simmers here. Has established with [facility] (Dr. Juliene Pina) for pregnancy care patient states she is aware of this going on. Has seen physical therapy once, also saw chiropracter once and had dry needling done. Treatments thus far have included tylenol, flexeril, norco, percocet.  Says her physical therapist told her to get a wheelchair as she is concerned she may fall. Has not had any spinal imaging, as per patient providers were concerned about risks given pregnancy. She is [redacted]w[redacted]d today.  Will order DME wheelchair, though advised patient stay as active as possible.  Given severity of her pain, warrants further evaluation and likely imaging. Will refer to orthopedics given severity of pain in pregnancy. Degenerative disc disease noted on CT abdomen/pelvis from 1/28. May benefit from MRI to eval further, while weighing risks/benefits given early pregnancy.  Patient agreeable with plan and appreciative. Will route note to [facility] provider Dr. Juliene Pina so she is aware.  [name], MD

## 2019-05-06 NOTE — Telephone Encounter (Signed)
Community message send to [employer].

## 2019-05-06 NOTE — Telephone Encounter (Signed)
[name], [name]  Gildo Crisco P, CMA; Stenson, Melissa; [name], [name]; Ott, Jennifer L   Got it, thanks!   

## 2019-05-07 DIAGNOSIS — M25552 Pain in left hip: Secondary | ICD-10-CM | POA: Diagnosis not present

## 2019-05-07 DIAGNOSIS — M62830 Muscle spasm of back: Secondary | ICD-10-CM | POA: Diagnosis not present

## 2019-05-07 DIAGNOSIS — M6281 Muscle weakness (generalized): Secondary | ICD-10-CM | POA: Diagnosis not present

## 2019-05-07 DIAGNOSIS — M545 Low back pain: Secondary | ICD-10-CM | POA: Diagnosis not present

## 2019-05-07 DIAGNOSIS — M5417 Radiculopathy, lumbosacral region: Secondary | ICD-10-CM | POA: Diagnosis not present

## 2019-05-11 ENCOUNTER — Other Ambulatory Visit: Payer: Self-pay

## 2019-05-11 ENCOUNTER — Ambulatory Visit (INDEPENDENT_AMBULATORY_CARE_PROVIDER_SITE_OTHER): Payer: Medicare Other | Admitting: Family Medicine

## 2019-05-11 ENCOUNTER — Encounter: Payer: Self-pay | Admitting: Family Medicine

## 2019-05-11 DIAGNOSIS — M5442 Lumbago with sciatica, left side: Secondary | ICD-10-CM

## 2019-05-11 DIAGNOSIS — Z3A12 12 weeks gestation of pregnancy: Secondary | ICD-10-CM | POA: Diagnosis not present

## 2019-05-11 MED ORDER — MONTELUKAST SODIUM 10 MG PO TABS: ORAL_TABLET | ORAL | 1 refills | Status: DC

## 2019-05-11 NOTE — Progress Notes (Signed)
Office Visit Note   Patient: [name]           Date of Birth: January 17, 1980           MRN: [id number] Visit Date: 05/11/2019 Requested by: Leeanne Rio, [address],  [place] [id number] PCP: Leeanne Rio, MD  Subjective: Chief Complaint  Patient presents with  . Lower Back - Pain    Pain in the middle part of the lower back and over to the left side x 7 weeks. NKI. Pain radiates down the left leg to the toes. Occasional numbness/tingling in the toes. Pain in back aches, in the leg it's burning/spasm-like pain.    HPI: She is here with low back and left leg pain.  Symptoms started about 2 months ago, no injury.  She started feeling pain radiating down the back of her leg.  Pain got progressively worse and she went to her PCP who gave her some medications.  She was subsequently diagnosed with pregnancy.  About a month ago she was in such severe pain that she went to the ER.  She is now using a walker for support.  She is currently [redacted] weeks pregnant and there have been no complications with the pregnancy otherwise.  She has 3 other children and never had problems like this before.  Pain is best when lying on her side, worse when sitting.  No bowel or bladder dysfunction, no rash, no fevers or chills.  She tried chiropractic once at Dr. Jimmye Norman office, but insurance did not cover it so she stopped.  [facility]              ROS:   All other systems were reviewed and are negative.  Objective: Vital Signs: LMP 02/09/2019   Physical Exam:  General:  Alert and oriented, in no acute distress. Pulm:  Breathing unlabored. Psy:  Normal mood, congruent affect. Low back: She is tender to palpation in the midline at the L5-S1 level.  She has pain in the left sciatic notch.  Straight leg raise is equivocal, lower extremity strength and reflexes are normal except slight dorsiflexion weakness in the left  ankle.  Imaging: No results found.  Assessment & Plan: 1.  Low back pain with left-sided sciatica, probable lumbar disc protrusion -Discussed options with her, elected to check with her obstetrician to see whether it would be safe for her to have an epidural steroid injection. -She will continue with physical therapy.     Procedures: No procedures performed  No notes on file     PMFS History: Patient Active Problem List   Diagnosis Date Noted  . Back pain affecting pregnancy in first trimester 04/21/2019  . Peptic ulcer disease 02/01/2019  . Muscle spasm 10/15/2018  . Fatigue 07/01/2017  . Vaginal candidiasis 04/15/2017  . RUQ abdominal pain 03/04/2017  . Acute bilateral low back pain with bilateral sciatica 09/10/2016  . Contraception management 03/17/2015  . Large breasts 07/13/2014  . S/P repeat low transverse C-section 03/05/2014  . Coccyx pain 01/03/2014  . Right shoulder pain 07/24/2013  . Pes planus 07/10/2012  . Plantar fasciitis, bilateral 07/10/2012  . Drug abuse, marijuana 01/20/2011  . Moderate persistent allergic asthma without complication 00/93/8182  . CONSTIPATION, CHRONIC 09/10/2008  . COMMON MIGRAINE 03/20/2007  . Morbid obesity ([place]) 05/24/2006  . Allergic rhinitis 03/14/2006  . GASTROESOPHAGEAL REFLUX, NO ESOPHAGITIS 03/14/2006   Past Medical History:  Diagnosis Date  .  Asthma     reservations to 2-3 timmes  a year that required ED visits/hospitalizations. Never intubated  . GERD (gastroesophageal reflux disease)   . Seasonal allergies     Family History  Problem Relation Age of Onset  . Hypertension Mother   . Allergies Mother   . Asthma Father   . Sleep apnea Father   . Hyperlipidemia Father   . Emphysema Maternal Grandfather   . Allergies Maternal Grandmother   . Asthma Maternal Grandmother   . Diabetes Maternal Grandmother     Past Surgical History:  Procedure Laterality Date  . CESAREAN SECTION    . CESAREAN SECTION N/A  10/25/2012   Procedure: CESAREAN SECTION;  Surgeon: Dorien Chihuahua. Richardson Dopp, MD;  Location: [facility];  Service: Obstetrics;  Laterality: N/A;  . CESAREAN SECTION N/A 03/05/2014   Procedure: CESAREAN SECTION;  Surgeon: Purcell Nails, MD;  Location: [facility];  Service: Obstetrics;  Laterality: N/A;  . CHOLECYSTECTOMY N/A 04/09/2017   Procedure: LAPAROSCOPIC CHOLECYSTECTOMY;  Surgeon: Axel Filler, MD;  Location: [facility];  Service: General;  Laterality: N/A;  . FRACTURE SURGERY     left middle finger   Social History   Occupational History  . Occupation: cosmetologist  Tobacco Use  . Smoking status: Former Smoker    Packs/day: 0.30    Years: 0.50    Pack years: 0.15    Types: Cigarettes    Quit date: 01/15/2001    Years since quitting: 18.3  . Smokeless tobacco: Never Used  Substance and Sexual Activity  . Alcohol use: No    Comment: occasionaly  . Drug use: No  . Sexual activity: Yes    Birth control/protection: None

## 2019-05-12 ENCOUNTER — Encounter: Payer: Self-pay | Admitting: Family Medicine

## 2019-05-12 DIAGNOSIS — M5417 Radiculopathy, lumbosacral region: Secondary | ICD-10-CM | POA: Diagnosis not present

## 2019-05-12 DIAGNOSIS — M25552 Pain in left hip: Secondary | ICD-10-CM | POA: Diagnosis not present

## 2019-05-12 DIAGNOSIS — M6281 Muscle weakness (generalized): Secondary | ICD-10-CM | POA: Diagnosis not present

## 2019-05-12 DIAGNOSIS — M62830 Muscle spasm of back: Secondary | ICD-10-CM | POA: Diagnosis not present

## 2019-05-12 DIAGNOSIS — M545 Low back pain: Secondary | ICD-10-CM | POA: Diagnosis not present

## 2019-05-14 DIAGNOSIS — M5417 Radiculopathy, lumbosacral region: Secondary | ICD-10-CM | POA: Diagnosis not present

## 2019-05-14 DIAGNOSIS — M545 Low back pain: Secondary | ICD-10-CM | POA: Diagnosis not present

## 2019-05-14 DIAGNOSIS — M25552 Pain in left hip: Secondary | ICD-10-CM | POA: Diagnosis not present

## 2019-05-14 DIAGNOSIS — M6281 Muscle weakness (generalized): Secondary | ICD-10-CM | POA: Diagnosis not present

## 2019-05-14 DIAGNOSIS — M62830 Muscle spasm of back: Secondary | ICD-10-CM | POA: Diagnosis not present

## 2019-05-21 DIAGNOSIS — M62830 Muscle spasm of back: Secondary | ICD-10-CM | POA: Diagnosis not present

## 2019-05-21 DIAGNOSIS — M5417 Radiculopathy, lumbosacral region: Secondary | ICD-10-CM | POA: Diagnosis not present

## 2019-05-21 DIAGNOSIS — M6281 Muscle weakness (generalized): Secondary | ICD-10-CM | POA: Diagnosis not present

## 2019-05-21 DIAGNOSIS — M545 Low back pain: Secondary | ICD-10-CM | POA: Diagnosis not present

## 2019-05-21 DIAGNOSIS — M25552 Pain in left hip: Secondary | ICD-10-CM | POA: Diagnosis not present

## 2019-06-03 DIAGNOSIS — M25552 Pain in left hip: Secondary | ICD-10-CM | POA: Diagnosis not present

## 2019-06-03 DIAGNOSIS — M5417 Radiculopathy, lumbosacral region: Secondary | ICD-10-CM | POA: Diagnosis not present

## 2019-06-03 DIAGNOSIS — M62830 Muscle spasm of back: Secondary | ICD-10-CM | POA: Diagnosis not present

## 2019-06-03 DIAGNOSIS — M5489 Other dorsalgia: Secondary | ICD-10-CM | POA: Diagnosis not present

## 2019-06-03 DIAGNOSIS — M545 Low back pain: Secondary | ICD-10-CM | POA: Diagnosis not present

## 2019-06-03 DIAGNOSIS — M6281 Muscle weakness (generalized): Secondary | ICD-10-CM | POA: Diagnosis not present

## 2019-06-04 DIAGNOSIS — M25552 Pain in left hip: Secondary | ICD-10-CM | POA: Diagnosis not present

## 2019-06-04 DIAGNOSIS — M62830 Muscle spasm of back: Secondary | ICD-10-CM | POA: Diagnosis not present

## 2019-06-04 DIAGNOSIS — M545 Low back pain: Secondary | ICD-10-CM | POA: Diagnosis not present

## 2019-06-04 DIAGNOSIS — M6281 Muscle weakness (generalized): Secondary | ICD-10-CM | POA: Diagnosis not present

## 2019-06-04 DIAGNOSIS — M5417 Radiculopathy, lumbosacral region: Secondary | ICD-10-CM | POA: Diagnosis not present

## 2019-06-09 DIAGNOSIS — M545 Low back pain: Secondary | ICD-10-CM | POA: Diagnosis not present

## 2019-06-09 DIAGNOSIS — M6281 Muscle weakness (generalized): Secondary | ICD-10-CM | POA: Diagnosis not present

## 2019-06-09 DIAGNOSIS — M25552 Pain in left hip: Secondary | ICD-10-CM | POA: Diagnosis not present

## 2019-06-09 DIAGNOSIS — M62830 Muscle spasm of back: Secondary | ICD-10-CM | POA: Diagnosis not present

## 2019-06-09 DIAGNOSIS — M5417 Radiculopathy, lumbosacral region: Secondary | ICD-10-CM | POA: Diagnosis not present

## 2019-06-11 DIAGNOSIS — M6281 Muscle weakness (generalized): Secondary | ICD-10-CM | POA: Diagnosis not present

## 2019-06-11 DIAGNOSIS — M62830 Muscle spasm of back: Secondary | ICD-10-CM | POA: Diagnosis not present

## 2019-06-11 DIAGNOSIS — M25552 Pain in left hip: Secondary | ICD-10-CM | POA: Diagnosis not present

## 2019-06-11 DIAGNOSIS — M5417 Radiculopathy, lumbosacral region: Secondary | ICD-10-CM | POA: Diagnosis not present

## 2019-06-11 DIAGNOSIS — M545 Low back pain: Secondary | ICD-10-CM | POA: Diagnosis not present

## 2019-06-16 DIAGNOSIS — M6281 Muscle weakness (generalized): Secondary | ICD-10-CM | POA: Diagnosis not present

## 2019-06-16 DIAGNOSIS — M25552 Pain in left hip: Secondary | ICD-10-CM | POA: Diagnosis not present

## 2019-06-16 DIAGNOSIS — M545 Low back pain: Secondary | ICD-10-CM | POA: Diagnosis not present

## 2019-06-16 DIAGNOSIS — M62830 Muscle spasm of back: Secondary | ICD-10-CM | POA: Diagnosis not present

## 2019-06-16 DIAGNOSIS — M5417 Radiculopathy, lumbosacral region: Secondary | ICD-10-CM | POA: Diagnosis not present

## 2019-06-19 ENCOUNTER — Ambulatory Visit: Payer: Self-pay | Admitting: *Deleted

## 2019-06-19 NOTE — Telephone Encounter (Signed)
Pt called into the [facility] last night after 7:00 PM.  Upon starting my shift this morning I saw her note that was left by the agent and called her.  She has a history of severe sciatica pain however she is 4 months pregnant and the pain is worse and radiating down both of her legs.   She is under the care of an OB doctor, orthopedic and her PCP for this issue.   "They offered to do a steroid shot in my spine but I was uncomfortable because of x-ray having to be used since I'm pregnant".     See triage notes.  I have referred her to the ED at [facility] since the [facility] and [facility] is on that campus.  That way if they feel she needs OB care she is already on the campus.  She was agreeable with going to the ED.  She is going to call her son to take her.   I asked if she had tried to contact her OB or any of her doctors during the night.   "I tried but could not get anyone".      Reason for Disposition . Unable to walk    4 months pregnant.  Has sciatica but both sides of her lower back is with sharp pains radiating down both legs that is worse now.  Can walk but it's very difficult for her to stand up straight due to the severe pain.  Answer Assessment - Initial Assessment Questions 1. ONSET: "When did the pain begin?"      Pt having severe back pain.  It's on both sides of back.  It's a sharp pain like "you break something like pain".   I'm having spasms up and down my back.   I've been taking Tylenol it takes the edge off.   I'm using a heating pad and ice packs. I have sciatica real bad.   My PT says it can radiate up in the last phase.   It's so painful.   I can't stand up straight   It's hard to lay down.   When I lay on my side it's hard for me to breath.  I have asthma too.  I used my inhaler last night. 2. LOCATION: "Where does it hurt?" (upper, mid or lower back)     Both sides of lower back.   I had pain in the top of my stomach yesterday.   No  vaginal bleeding.   3. SEVERITY: "How bad is the pain?"  (e.g., Scale 1-10; mild, moderate, or severe)   - MILD (1-3): doesn't interfere with normal activities    - MODERATE (4-7): interferes with normal activities or awakens from sleep    - SEVERE (8-10): excruciating pain, unable to do any normal activities      Severe.  This pain has been in my legs the whole 4 months I've been pregnant.  It's just worse now.   4. PATTERN: "Is the pain constant?" (e.g., yes, no; constant, intermittent)      Yes 5. RADIATION: "Does the pain shoot into your legs or elsewhere?"     Both legs.    My OB said to keep going to the PT and take Tylenol. 6. CAUSE:  "What do you think is causing the back pain?"      I have severe sciatica.    7. BACK OVERUSE:  "Any recent lifting of heavy objects, strenuous work or exercise?"  No 8. MEDICATIONS: "What have you taken so far for the pain?" (e.g., nothing, acetaminophen, NSAIDS)     Tylenol is the only thing I can take since I'm pregnant. 9. NEUROLOGIC SYMPTOMS: "Do you have any weakness, numbness, or problems with bowel/bladder control?"     It's hard to walk straight up because my lower back hurts so bad and it's radiating down both of my legs. 10. OTHER SYMPTOMS: "Do you have any other symptoms?" (e.g., fever, abdominal pain, burning with urination, blood in urine)       Denies abd pain other than yesterday she had pain in the top of her stomach not present this morning.  Denies abd pain in her lower abd.   Denies vaginal bleeding. 11. PREGNANCY: "Is there any chance you are pregnant?" (e.g., yes, no; LMP)       Yes.  She's in her 3rd trimester.   She is under the care of an orthopedic, OB and PCP for this back pain.  Answer Assessment - Initial Assessment Questions 1. ONSET: "When did the pain begin?"      I have severe sciatica but it seems to be worse. 2. LOCATION: "Where does it hurt?" (upper, mid or lower back)     Both sides of lower back and down both  legs. 3. SEVERITY: "How bad is the pain?"  (e.g., Scale 1-10; mild, moderate, or severe)   - MILD (1-3): doesn't interfere with normal activities    - MODERATE (4-7): interferes with normal activities or awakens from sleep    - SEVERE (8-10): excruciating pain, unable to do any normal activities      Severe. 4. PATTERN: "Is the pain constant?" (e.g., yes, no; constant, intermittent)      Yes 5. RADIATION: "Does the pain shoot into your legs or elsewhere?"     Yes 6. CAUSE:  "What do you think is causing the back pain?"      "My sciatica"  7. BACK OVERUSE:  "Any recent lifting of heavy objects, strenuous work or exercise?"     No 8. MEDICATIONS: "What have you taken so far for the pain?" (e.g., nothing, acetaminophen)     Tylenol 9. NEUROLOGIC SYMPTOMS: "Do you have any weakness, numbness, or problems with bowel/bladder control?"     *No Answer* 10. OTHER SYMPTOMS: "Do you have any other symptoms?" (e.g., fever, abdominal pain, burning with urination, blood in urine, fluid leaking from vagina)       See assessment answers under the back pain protocol. 11. EDD: "What date are you expecting to deliver?"       "I'm in my 4th month of pregnancy.  Protocols used: PREGNANCY - BACK PAIN-A-AH, BACK PAIN-A-AH

## 2019-06-23 DIAGNOSIS — M62830 Muscle spasm of back: Secondary | ICD-10-CM | POA: Diagnosis not present

## 2019-06-23 DIAGNOSIS — M6281 Muscle weakness (generalized): Secondary | ICD-10-CM | POA: Diagnosis not present

## 2019-06-23 DIAGNOSIS — M545 Low back pain: Secondary | ICD-10-CM | POA: Diagnosis not present

## 2019-06-23 DIAGNOSIS — M5417 Radiculopathy, lumbosacral region: Secondary | ICD-10-CM | POA: Diagnosis not present

## 2019-06-23 DIAGNOSIS — M25552 Pain in left hip: Secondary | ICD-10-CM | POA: Diagnosis not present

## 2019-06-25 DIAGNOSIS — M62830 Muscle spasm of back: Secondary | ICD-10-CM | POA: Diagnosis not present

## 2019-06-25 DIAGNOSIS — M25552 Pain in left hip: Secondary | ICD-10-CM | POA: Diagnosis not present

## 2019-06-25 DIAGNOSIS — M545 Low back pain: Secondary | ICD-10-CM | POA: Diagnosis not present

## 2019-06-25 DIAGNOSIS — M6281 Muscle weakness (generalized): Secondary | ICD-10-CM | POA: Diagnosis not present

## 2019-06-25 DIAGNOSIS — M5417 Radiculopathy, lumbosacral region: Secondary | ICD-10-CM | POA: Diagnosis not present

## 2019-07-02 DIAGNOSIS — M25552 Pain in left hip: Secondary | ICD-10-CM | POA: Diagnosis not present

## 2019-07-02 DIAGNOSIS — M545 Low back pain: Secondary | ICD-10-CM | POA: Diagnosis not present

## 2019-07-02 DIAGNOSIS — M6281 Muscle weakness (generalized): Secondary | ICD-10-CM | POA: Diagnosis not present

## 2019-07-02 DIAGNOSIS — M62830 Muscle spasm of back: Secondary | ICD-10-CM | POA: Diagnosis not present

## 2019-07-02 DIAGNOSIS — M5417 Radiculopathy, lumbosacral region: Secondary | ICD-10-CM | POA: Diagnosis not present

## 2019-07-07 DIAGNOSIS — M62830 Muscle spasm of back: Secondary | ICD-10-CM | POA: Diagnosis not present

## 2019-07-07 DIAGNOSIS — M6281 Muscle weakness (generalized): Secondary | ICD-10-CM | POA: Diagnosis not present

## 2019-07-07 DIAGNOSIS — M25552 Pain in left hip: Secondary | ICD-10-CM | POA: Diagnosis not present

## 2019-07-07 DIAGNOSIS — M545 Low back pain: Secondary | ICD-10-CM | POA: Diagnosis not present

## 2019-07-07 DIAGNOSIS — M5417 Radiculopathy, lumbosacral region: Secondary | ICD-10-CM | POA: Diagnosis not present

## 2019-07-14 DIAGNOSIS — M5417 Radiculopathy, lumbosacral region: Secondary | ICD-10-CM | POA: Diagnosis not present

## 2019-07-14 DIAGNOSIS — M545 Low back pain: Secondary | ICD-10-CM | POA: Diagnosis not present

## 2019-07-14 DIAGNOSIS — M25552 Pain in left hip: Secondary | ICD-10-CM | POA: Diagnosis not present

## 2019-07-14 DIAGNOSIS — M6281 Muscle weakness (generalized): Secondary | ICD-10-CM | POA: Diagnosis not present

## 2019-07-14 DIAGNOSIS — M62830 Muscle spasm of back: Secondary | ICD-10-CM | POA: Diagnosis not present

## 2019-07-16 DIAGNOSIS — M62830 Muscle spasm of back: Secondary | ICD-10-CM | POA: Diagnosis not present

## 2019-07-16 DIAGNOSIS — M5417 Radiculopathy, lumbosacral region: Secondary | ICD-10-CM | POA: Diagnosis not present

## 2019-07-16 DIAGNOSIS — M6281 Muscle weakness (generalized): Secondary | ICD-10-CM | POA: Diagnosis not present

## 2019-07-16 DIAGNOSIS — M545 Low back pain: Secondary | ICD-10-CM | POA: Diagnosis not present

## 2019-07-16 DIAGNOSIS — M25552 Pain in left hip: Secondary | ICD-10-CM | POA: Diagnosis not present

## 2019-07-21 DIAGNOSIS — M6281 Muscle weakness (generalized): Secondary | ICD-10-CM | POA: Diagnosis not present

## 2019-07-21 DIAGNOSIS — M5417 Radiculopathy, lumbosacral region: Secondary | ICD-10-CM | POA: Diagnosis not present

## 2019-07-21 DIAGNOSIS — M545 Low back pain: Secondary | ICD-10-CM | POA: Diagnosis not present

## 2019-07-21 DIAGNOSIS — M25552 Pain in left hip: Secondary | ICD-10-CM | POA: Diagnosis not present

## 2019-07-21 DIAGNOSIS — M62830 Muscle spasm of back: Secondary | ICD-10-CM | POA: Diagnosis not present

## 2019-08-04 DIAGNOSIS — M25552 Pain in left hip: Secondary | ICD-10-CM | POA: Diagnosis not present

## 2019-08-04 DIAGNOSIS — M545 Low back pain: Secondary | ICD-10-CM | POA: Diagnosis not present

## 2019-08-04 DIAGNOSIS — M6281 Muscle weakness (generalized): Secondary | ICD-10-CM | POA: Diagnosis not present

## 2019-08-04 DIAGNOSIS — M5417 Radiculopathy, lumbosacral region: Secondary | ICD-10-CM | POA: Diagnosis not present

## 2019-08-04 DIAGNOSIS — M62830 Muscle spasm of back: Secondary | ICD-10-CM | POA: Diagnosis not present

## 2019-08-10 ENCOUNTER — Other Ambulatory Visit: Payer: Self-pay | Admitting: Family Medicine

## 2019-08-11 DIAGNOSIS — M6281 Muscle weakness (generalized): Secondary | ICD-10-CM | POA: Diagnosis not present

## 2019-08-11 DIAGNOSIS — M545 Low back pain: Secondary | ICD-10-CM | POA: Diagnosis not present

## 2019-08-11 DIAGNOSIS — M62830 Muscle spasm of back: Secondary | ICD-10-CM | POA: Diagnosis not present

## 2019-08-11 DIAGNOSIS — M25552 Pain in left hip: Secondary | ICD-10-CM | POA: Diagnosis not present

## 2019-08-11 DIAGNOSIS — M5417 Radiculopathy, lumbosacral region: Secondary | ICD-10-CM | POA: Diagnosis not present

## 2019-08-24 ENCOUNTER — Encounter (HOSPITAL_BASED_OUTPATIENT_CLINIC_OR_DEPARTMENT_OTHER): Payer: Self-pay | Admitting: *Deleted

## 2019-08-24 ENCOUNTER — Emergency Department (HOSPITAL_BASED_OUTPATIENT_CLINIC_OR_DEPARTMENT_OTHER)
Admission: EM | Admit: 2019-08-24 | Discharge: 2019-08-24 | Disposition: A | Discharge status: Home / Self Care | Payer: Medicare Other | Attending: Emergency Medicine | Admitting: Emergency Medicine

## 2019-08-24 ENCOUNTER — Emergency Department (HOSPITAL_BASED_OUTPATIENT_CLINIC_OR_DEPARTMENT_OTHER): Payer: Medicare Other

## 2019-08-24 ENCOUNTER — Other Ambulatory Visit: Payer: Self-pay

## 2019-08-24 DIAGNOSIS — J45909 Unspecified asthma, uncomplicated: Secondary | ICD-10-CM | POA: Diagnosis not present

## 2019-08-24 DIAGNOSIS — U071 COVID-19: Secondary | ICD-10-CM | POA: Insufficient documentation

## 2019-08-24 DIAGNOSIS — Z87891 Personal history of nicotine dependence: Secondary | ICD-10-CM | POA: Insufficient documentation

## 2019-08-24 DIAGNOSIS — R05 Cough: Secondary | ICD-10-CM | POA: Diagnosis present

## 2019-08-24 DIAGNOSIS — Z79899 Other long term (current) drug therapy: Secondary | ICD-10-CM | POA: Diagnosis not present

## 2019-08-24 DIAGNOSIS — O99513 Diseases of the respiratory system complicating pregnancy, third trimester: Secondary | ICD-10-CM | POA: Diagnosis not present

## 2019-08-24 DIAGNOSIS — O98513 Other viral diseases complicating pregnancy, third trimester: Secondary | ICD-10-CM | POA: Diagnosis not present

## 2019-08-24 DIAGNOSIS — Z3490 Encounter for supervision of normal pregnancy, unspecified, unspecified trimester: Secondary | ICD-10-CM

## 2019-08-24 DIAGNOSIS — O09523 Supervision of elderly multigravida, third trimester: Secondary | ICD-10-CM | POA: Insufficient documentation

## 2019-08-24 DIAGNOSIS — Z7951 Long term (current) use of inhaled steroids: Secondary | ICD-10-CM | POA: Insufficient documentation

## 2019-08-24 DIAGNOSIS — O26893 Other specified pregnancy related conditions, third trimester: Secondary | ICD-10-CM | POA: Diagnosis not present

## 2019-08-24 DIAGNOSIS — Z3A28 28 weeks gestation of pregnancy: Secondary | ICD-10-CM | POA: Insufficient documentation

## 2019-08-24 DIAGNOSIS — Z7982 Long term (current) use of aspirin: Secondary | ICD-10-CM | POA: Diagnosis not present

## 2019-08-24 DIAGNOSIS — R0602 Shortness of breath: Secondary | ICD-10-CM | POA: Diagnosis not present

## 2019-08-24 DIAGNOSIS — R8271 Bacteriuria: Secondary | ICD-10-CM

## 2019-08-24 LAB — BASIC METABOLIC PANEL
Anion gap: 12 (ref 5–15)
BUN: 8 mg/dL (ref 6–20)
CO2: 19 mmol/L — ABNORMAL LOW (ref 22–32)
Calcium: 8.8 mg/dL — ABNORMAL LOW (ref 8.9–10.3)
Chloride: 106 mmol/L (ref 98–111)
Creatinine, Ser: 0.63 mg/dL (ref 0.44–1.00)
GFR calc Af Amer: >60 mL/min (ref >60)
GFR calc non Af Amer: >60 mL/min (ref >60)
Glucose, Bld: 110 mg/dL — ABNORMAL HIGH (ref 70–99)
Potassium: 3.1 mmol/L — ABNORMAL LOW (ref 3.5–5.1)
Sodium: 137 mmol/L (ref 135–145)

## 2019-08-24 LAB — CBC WITH DIFFERENTIAL/PLATELET
Abs Immature Granulocytes: 0.04 10*3/uL (ref 0.00–0.07)
Basophils Absolute: 0 10*3/uL (ref 0.0–0.1)
Basophils Relative: 0 %
Eosinophils Absolute: 0 10*3/uL (ref 0.0–0.5)
Eosinophils Relative: 0 %
HCT: 35.4 % — ABNORMAL LOW (ref 36.0–46.0)
Hemoglobin: 11.2 g/dL — ABNORMAL LOW (ref 12.0–15.0)
Immature Granulocytes: 1 %
Lymphocytes Relative: 14 %
Lymphs Abs: 1 10*3/uL (ref 0.7–4.0)
MCH: 25 pg — ABNORMAL LOW (ref 26.0–34.0)
MCHC: 31.6 g/dL (ref 30.0–36.0)
MCV: 79 fL — ABNORMAL LOW (ref 80.0–100.0)
Monocytes Absolute: 0.5 10*3/uL (ref 0.1–1.0)
Monocytes Relative: 7 %
Neutro Abs: 5.4 10*3/uL (ref 1.7–7.7)
Neutrophils Relative %: 78 %
Platelets: 355 10*3/uL (ref 150–400)
RBC: 4.48 MIL/uL (ref 3.87–5.11)
RDW: 15.5 % (ref 11.5–15.5)
WBC: 6.9 10*3/uL (ref 4.0–10.5)
nRBC: 0 % (ref 0.0–0.2)

## 2019-08-24 LAB — URINALYSIS, ROUTINE W REFLEX MICROSCOPIC
Glucose, UA: NEGATIVE mg/dL
Hgb urine dipstick: NEGATIVE
Ketones, ur: 15 mg/dL — AB
Nitrite: NEGATIVE
Protein, ur: 100 mg/dL — AB
Specific Gravity, Urine: 1.02 (ref 1.005–1.030)
pH: 6 (ref 5.0–8.0)

## 2019-08-24 LAB — URINALYSIS, MICROSCOPIC (REFLEX)

## 2019-08-24 LAB — TROPONIN I (HIGH SENSITIVITY): Troponin I (High Sensitivity): 6 ng/L (ref <18)

## 2019-08-24 MED ORDER — ONDANSETRON 4 MG PO TBDP: 4.0000 mg | ORAL_TABLET | Freq: Three times a day (TID) | ORAL | 0 refills | Status: DC | PRN

## 2019-08-24 MED ORDER — CEPHALEXIN 500 MG PO CAPS: 500.0000 mg | ORAL_CAPSULE | Freq: Three times a day (TID) | ORAL | 0 refills | Status: AC

## 2019-08-24 MED ORDER — SODIUM CHLORIDE 0.9 % IV BOLUS
1000.0000 mL | Freq: Once | INTRAVENOUS | Status: AC
  Administered 2019-08-24: 1000 mL via INTRAVENOUS

## 2019-08-24 MED ORDER — ONDANSETRON HCL 4 MG/2ML IJ SOLN
4.0000 mg | Freq: Once | INTRAMUSCULAR | Status: AC
  Administered 2019-08-24: 4 mg via INTRAVENOUS
  Filled 2019-08-24: qty 2

## 2019-08-24 MED ORDER — ACETAMINOPHEN 325 MG PO TABS
650.0000 mg | ORAL_TABLET | Freq: Once | ORAL | Status: AC
  Administered 2019-08-24: 650 mg via ORAL
  Filled 2019-08-24: qty 2

## 2019-08-24 NOTE — Progress Notes (Signed)
OB RR RN called to remotely assess G8P3 presenting to [facility] ED with shortness of breath and covid symptoms following +covid test.  Reports that she feels baby moving ok, no LOF, bleeding, or contractions. [facility] noted in chart but pt reports that she is seen by Nigel Bridgeman CNM at [facility].  Placed on monitor, will obtain NST and call [facility] MD on call  Update 2100: Called Dr. Su Hilt at 2055, cleared from [facility] standpoint, would like follow up on pt POC.  Called primary RN to update.  Ok to take off monitor.

## 2019-08-24 NOTE — ED Triage Notes (Signed)
Covid positive 5 days ago. Symptoms since Monday. She is [redacted] weeks pregnant. C.o SOB.

## 2019-08-24 NOTE — Discharge Instructions (Addendum)
Please take antibiotic as prescribed for the possible urinary tract infection.  Take Zofran as needed for nausea.  If you develop worsening difficulty breathing, chest pain, passing out, other concerns symptom, please return to ER for reassessment.  Please schedule virtual recheck with either your primary doctor or your OB/GYN in 24 to 48 hours.

## 2019-08-24 NOTE — ED Notes (Signed)
OB rapid response called  Pt can come off monitor and is cleared from the OB point of view

## 2019-08-24 NOTE — ED Notes (Signed)
Pt placed on toco monitor  Rapid OB response notified  Pt being monitored for 20 minutes

## 2019-08-25 NOTE — ED Provider Notes (Signed)
[facility] EMERGENCY DEPARTMENT Provider Note   CSN: [id number] Arrival date & time: 08/24/19  1931     History No chief complaint on file.   [name] is a 39 y.o. female.  Presents to ER with concern for Covid symptoms.  Patient has been having symptoms for approximately 1 week, initially mild but steadily worsening.  Has been experiencing cough, feeling more short of breath, some chest discomfort with coughing.  Body aches.  Fevers.  Reports that she is [redacted] weeks pregnant, no complications with pregnancy.  She has not had any vaginal bleeding, vaginal discharge, gush of fluids, contractions.  She also has been having some increased nausea and occasional episodes of vomiting.  Nonbloody nonbilious.  No associated abdominal pain.  HPI     Past Medical History:  Diagnosis Date  . Asthma     reservations to 2-3 timmes  a year that required ED visits/hospitalizations. Never intubated  . GERD (gastroesophageal reflux disease)   . Seasonal allergies     Patient Active Problem List   Diagnosis Date Noted  . Back pain affecting pregnancy in first trimester 04/21/2019  . Peptic ulcer disease 02/01/2019  . Muscle spasm 10/15/2018  . Fatigue 07/01/2017  . Vaginal candidiasis 04/15/2017  . RUQ abdominal pain 03/04/2017  . Acute bilateral low back pain with bilateral sciatica 09/10/2016  . Contraception management 03/17/2015  . Large breasts 07/13/2014  . S/P repeat low transverse C-section 03/05/2014  . Coccyx pain 01/03/2014  . Right shoulder pain 07/24/2013  . Pes planus 07/10/2012  . Plantar fasciitis, bilateral 07/10/2012  . Drug abuse, marijuana 01/20/2011  . Moderate persistent allergic asthma without complication 12/28/2009  . CONSTIPATION, CHRONIC 09/10/2008  . COMMON MIGRAINE 03/20/2007  . Morbid obesity (HCC) 05/24/2006  . Allergic rhinitis 03/14/2006  . GASTROESOPHAGEAL REFLUX, NO ESOPHAGITIS 03/14/2006    Past Surgical History:  Procedure  Laterality Date  . CESAREAN SECTION    . CESAREAN SECTION N/A 10/25/2012   Procedure: CESAREAN SECTION;  Surgeon: Dorien Chihuahua. Richardson Dopp, MD;  Location: [facility];  Service: Obstetrics;  Laterality: N/A;  . CESAREAN SECTION N/A 03/05/2014   Procedure: CESAREAN SECTION;  Surgeon: Purcell Nails, MD;  Location: [facility];  Service: Obstetrics;  Laterality: N/A;  . CHOLECYSTECTOMY N/A 04/09/2017   Procedure: LAPAROSCOPIC CHOLECYSTECTOMY;  Surgeon: Axel Filler, MD;  Location: [facility] OR;  Service: General;  Laterality: N/A;  . FRACTURE SURGERY     left middle finger     OB History    Gravida  8   Para  3   Term  3   Preterm  0   AB  4   Living  3     SAB  2   TAB  2   Ectopic  0   Multiple  0   Live Births  3           Family History  Problem Relation Age of Onset  . Hypertension Mother   . Allergies Mother   . Asthma Father   . Sleep apnea Father   . Hyperlipidemia Father   . Emphysema Maternal Grandfather   . Allergies Maternal Grandmother   . Asthma Maternal Grandmother   . Diabetes Maternal Grandmother     Social History   Tobacco Use  . Smoking status: Former Smoker    Packs/day: 0.30    Years: 0.50    Pack years: 0.15    Types: Cigarettes    Quit date: 01/15/2001  Years since quitting: 18.6  . Smokeless tobacco: Never Used  Vaping Use  . Vaping Use: Never used  Substance Use Topics  . Alcohol use: No    Comment: occasionaly  . Drug use: No    Home Medications Prior to Admission medications   Medication Sig Start Date End Date Taking? Authorizing Provider  acetaminophen (TYLENOL) 500 MG tablet Take 500 mg by mouth every 8 (eight) hours as needed. 1-2 qd - bid    [provider]  albuterol (PROVENTIL) (2.5 MG/3ML) 0.083% nebulizer solution Take 3 mLs (2.5 mg total) by nebulization every 6 (six) hours as needed for wheezing or shortness of breath. 12/01/17   Wieters, Hallie C, PA-C  aspirin EC 81 MG tablet Take 81 mg by mouth daily.    [provider]  budesonide-formoterol (SYMBICORT) 160-4.5 MCG/ACT inhaler Inhale 2 puffs into the lungs 2 (two) times daily. 02/12/18   Westley Chandler, MD  cephALEXin (KEFLEX) 500 MG capsule Take 1 capsule (500 mg total) by mouth 3 (three) times daily for 5 days. 08/24/19 08/29/19  Milagros Loll, MD  cetirizine (ZYRTEC) 10 MG tablet Take 1 tablet (10 mg total) by mouth daily. 07/17/16   [name], MD  montelukast (SINGULAIR) 10 MG tablet TAKE 10 MG BY MOUTH AT BEDTIME 05/11/19   [name], MD  ondansetron (ZOFRAN ODT) 4 MG disintegrating tablet Take 1 tablet (4 mg total) by mouth every 8 (eight) hours as needed for nausea or vomiting. 08/24/19   Milagros Loll, MD  Prenatal Vit-Fe Fumarate-FA (PRENATAL MULTIVITAMIN) TABS tablet Take 1 tablet by mouth daily at 12 noon.    [provider]  PROAIR HFA 108 [phone] Base) MCG/ACT inhaler INHALE 2 PUFFS BY MOUTH EVERY 4 HOURS AS NEEDED FOR WHEEZING AND FOR SHORTNESS OF BREATH 08/10/19   [name], MD    Allergies    Iohexol and Moxifloxacin  Review of Systems   Review of Systems  Constitutional: Positive for chills, fatigue and fever.  HENT: Negative for ear pain and sore throat.   Eyes: Negative for pain and visual disturbance.  Respiratory: Positive for cough and shortness of breath.   Cardiovascular: Positive for chest pain. Negative for palpitations.  Gastrointestinal: Positive for diarrhea, nausea and vomiting. Negative for abdominal pain.  Genitourinary: Negative for dysuria and hematuria.  Musculoskeletal: Negative for arthralgias and back pain.  Skin: Negative for color change and rash.  Neurological: Negative for seizures and syncope.  All other systems reviewed and are negative.   Physical Exam Updated Vital Signs BP 130/70   Pulse 94   Temp 98.4 F (36.9 C) (Oral)   Resp (!) 22   Ht 5\' 6"  (1.676 m)   Wt 121.1 kg   LMP 02/09/2019   SpO2 95%   BMI 43.09 kg/m   Physical Exam Vitals and  nursing note reviewed.  Constitutional:      General: She is not in acute distress.    Appearance: She is well-developed.  HENT:     Head: Normocephalic and atraumatic.  Eyes:     Conjunctiva/sclera: Conjunctivae normal.  Cardiovascular:     Rate and Rhythm: Normal rate and regular rhythm.     Heart sounds: No murmur heard.   Pulmonary:     Effort: Pulmonary effort is normal. No respiratory distress.     Breath sounds: Normal breath sounds.  Abdominal:     Palpations: Abdomen is soft.     Tenderness: There is no  abdominal tenderness.     Comments: Gravid abdomen  Musculoskeletal:        General: No deformity or signs of injury.     Cervical back: Neck supple.     Right lower leg: No edema.     Left lower leg: No edema.  Skin:    General: Skin is warm and dry.     Capillary Refill: Capillary refill takes less than 2 seconds.  Neurological:     General: No focal deficit present.     Mental Status: She is alert and oriented to person, place, and time.  Psychiatric:        Mood and Affect: Mood normal.        Behavior: Behavior normal.     ED Results / Procedures / Treatments   Labs (all labs ordered are listed, but only abnormal results are displayed) Labs Reviewed  URINALYSIS, ROUTINE W REFLEX MICROSCOPIC - Abnormal; Notable for the following components:      Result Value   APPearance CLOUDY (*)    Bilirubin Urine SMALL (*)    Ketones, ur 15 (*)    Protein, ur 100 (*)    Leukocytes,Ua SMALL (*)    All other components within normal limits  URINALYSIS, MICROSCOPIC (REFLEX) - Abnormal; Notable for the following components:   Bacteria, UA MANY (*)    All other components within normal limits  CBC WITH DIFFERENTIAL/PLATELET - Abnormal; Notable for the following components:   Hemoglobin 11.2 (*)    HCT 35.4 (*)    MCV 79.0 (*)    MCH 25.0 (*)    All other components within normal limits  BASIC METABOLIC PANEL - Abnormal; Notable for the following components:    Potassium 3.1 (*)    CO2 19 (*)    Glucose, Bld 110 (*)    Calcium 8.8 (*)    All other components within normal limits  TROPONIN I (HIGH SENSITIVITY)    EKG EKG Interpretation  Date/Time:  Monday August 24 2019 21:54:16 EDT Ventricular Rate:  94 PR Interval:    QRS Duration: 83 QT Interval:  383 QTC Calculation: 479 R Axis:   48 Text Interpretation: Sinus rhythm Confirmed by Marianna Fussykstra, Raesean Bartoletti ([id number]) on 08/24/2019 10:24:39 PM   Radiology DG Chest Portable 1 View  Result Date: 08/24/2019 CLINICAL DATA:  COVID positive and shortness of breath EXAM: PORTABLE CHEST 1 VIEW COMPARISON:  None. FINDINGS: The heart size and mediastinal contours are within normal limits. Hazy patchy airspace opacity seen at the periphery of the right lung base. The left lung is clear. No pleural effusion. No acute osseous abnormality. IMPRESSION: Hazy airspace opacity at the right lung base which could be due to atelectasis and/or infectious etiology. Electronically Signed   By: Jonna ClarkBindu  Avutu M.D.   On: 08/24/2019 20:22    Procedures Procedures (including critical care time)  Medications Ordered in ED Medications  acetaminophen (TYLENOL) tablet 650 mg (650 mg Oral Given 08/24/19 2009)  sodium chloride 0.9 % bolus 1,000 mL ( Intravenous Stopped 08/24/19 2319)  ondansetron (ZOFRAN) injection 4 mg (4 mg Intravenous Given 08/24/19 2143)    ED Course  I have reviewed the triage vital signs and the nursing notes.  Pertinent labs & imaging results that were available during my care of the patient were reviewed by me and considered in my medical decision making (see chart for details).    MDM Rules/Calculators/A&P  39 year old lady [redacted] weeks pregnant presenting to ER with concern worsening Covid symptoms.  Here, patient noted to be well-appearing, initially borderline fever with mild tachycardia.  CXR consistent with mild patchy infiltrates likely Covid pneumonia.  Given her GI symptoms,  checked labs, provided fluids and Zofran.  EKG without acute ischemic change, troponin normal limits, doubt ACS.  Labs were grossly stable.  Urine sample did have some bacteria, patient currently without urinary symptoms.  On reassessment, patient's vital signs remained stable, she has no hypoxia.  Suspect her symptoms are from Covid.  She is tolerating p.o. without difficulty at this time.  Believe she is appropriate for discharge and outpatient management.  Will provide Rx for Zofran, cephalexin for bacturia in pregnancy.  Recommended follow-up with primary and OB/GYN.  Reviewed return precautions and discharged home.    After the discussed management above, the patient was determined to be safe for discharge.  The patient was in agreement with this plan and all questions regarding their care were answered.  ED return precautions were discussed and the patient will return to the ED with any significant worsening of condition.   Final Clinical Impression(s) / ED Diagnoses Final diagnoses:  Bacteria in urine  COVID-19  Pregnancy, unspecified gestational age    Rx / DC Orders ED Discharge Orders         Ordered    ondansetron (ZOFRAN ODT) 4 MG disintegrating tablet  Every 8 hours PRN     Discontinue  Reprint     08/24/19 2331    cephALEXin (KEFLEX) 500 MG capsule  3 times daily     Discontinue  Reprint     08/24/19 2331           Milagros Loll, MD 08/25/19 0006

## 2019-08-28 ENCOUNTER — Telehealth: Payer: Self-pay

## 2019-08-28 NOTE — Telephone Encounter (Signed)
Received phone call from patient regarding testing positive for COVID. Patient reports positive COVID test Wednesday 8/4 with symptom onset on 8/2. Patient states that she is continuing to have productive cough with intermittent SHOB and diarrhea. Of note, patient is [redacted] weeks pregnant.   Scheduled virtual visit with ATC on Monday afternoon for follow up as [facility] clinic is full.   Strict ED precautions given.   Veronda Prude, RN

## 2019-08-31 ENCOUNTER — Other Ambulatory Visit: Payer: Self-pay

## 2019-08-31 ENCOUNTER — Telehealth (INDEPENDENT_AMBULATORY_CARE_PROVIDER_SITE_OTHER): Payer: Medicare Other | Admitting: Family Medicine

## 2019-08-31 DIAGNOSIS — U071 COVID-19: Secondary | ICD-10-CM | POA: Insufficient documentation

## 2019-08-31 DIAGNOSIS — B379 Candidiasis, unspecified: Secondary | ICD-10-CM | POA: Diagnosis not present

## 2019-08-31 DIAGNOSIS — J1282 Pneumonia due to coronavirus disease 2019: Secondary | ICD-10-CM | POA: Insufficient documentation

## 2019-08-31 DIAGNOSIS — T3695XA Adverse effect of unspecified systemic antibiotic, initial encounter: Secondary | ICD-10-CM

## 2019-08-31 MED ORDER — CLOTRIMAZOLE 1 % VA CREA: 1.0000 | TOPICAL_CREAM | Freq: Every day | VAGINAL | 0 refills | Status: AC

## 2019-08-31 NOTE — Assessment & Plan Note (Addendum)
Improving. 14 days from last symptom and >72 hours from last known fever. Encouraged to continue social distancing, washing, hands and wearing masks. Discussed making an informed decision to get vaccinated and can be vaccinated as soon as illness has resolved.  -Follow up with [place] as needed -Continue symbicort and decrease use of albuterol; follow up with pulmonology -Follow up with OBGYN for regular prenatal care

## 2019-08-31 NOTE — Progress Notes (Signed)
[facility] Telemedicine Visit  Patient consented to have virtual visit and was identified by name and date of birth. Method of visit: Video  Encounter participants: Patient: [name] - located at home Provider: Lavonda Jumbo - located at [facility] Others (if applicable): N/A  Chief Complaint: COVID positive  HPI:  COVID pnuemonia Ms. Huisman is a 39 yo [id number] @[redacted]w[redacted]d  found to be Covid + 08/24/2019 in ED.   Symptoms started 1 week prior to that date.  Was experiencing cough, shortness of breath, chest discomfort, body aches, fevers.  Was discharged same day. Now feeling back to 90-95%, some shortness of breath that is sporadic with turning over in bed or getting up to go back to bathroom.  Using nebs daily, using symbicort daily. Sees pulmonology on a regular basis. Not vaccinated against COVID. Would like to make an informed decision about getting it during pregnancy. [facility]es to [facility]. Baby moving. No leakage of fluids or bleeding.   Vaginal discharge Treated for UTI during ED visit. On a course of keflex. Now has a yeast infection, would like treatment for this. Endorse cottage cheese discharge.   ROS: per HPI  Pertinent PMHx: Morbid obesity (BMI 43)  Exam:  LMP 02/09/2019   Respiratory: Speaking in full sentences. No nasal flaring.   Assessment/Plan:  Pneumonia due to COVID-19 virus Improving. 14 days from last symptom and >72 hours from last known fever. Encouraged to continue social distancing, washing, hands and wearing masks. Discussed making an informed decision to get vaccinated and can be vaccinated as soon as illness has resolved.  -Follow up with 02/11/2019 as needed -Continue symbicort and decrease use of albuterol; follow up with pulmonology -Follow up with OBGYN for regular prenatal care   Antibiotic-induced yeast infection -1% Clotrimazole vaginal applicator x7 days -Finish Keflex course -Follow up if symptoms fail to improve     Time spent during visit with patient: 20 minutes

## 2019-08-31 NOTE — Patient Instructions (Signed)
It was very nice to meet you today. Please enjoy the rest of your week. Today you were seen for ED visit follow up. We discussed finishing antibiotic for UTI. Starting treatment for yeast infection. And when to get the COVID vaccine.   Please call the clinic at [phone] if your symptoms worsen or you have any concerns. It was our pleasure to serve you.  Quarantine can end after Day 10 without testing and if no symptoms have been reported during daily monitoring.

## 2019-08-31 NOTE — Assessment & Plan Note (Signed)
-  1% Clotrimazole vaginal applicator x7 days -Finish Keflex course -Follow up if symptoms fail to improve

## 2019-09-08 ENCOUNTER — Other Ambulatory Visit: Payer: Self-pay

## 2019-09-08 ENCOUNTER — Inpatient Hospital Stay (HOSPITAL_COMMUNITY)
Admission: AD | Admit: 2019-09-08 | Discharge: 2019-09-08 | Disposition: A | Discharge status: Home / Self Care | Payer: Medicare Other | Attending: Obstetrics & Gynecology | Admitting: Obstetrics & Gynecology

## 2019-09-08 ENCOUNTER — Inpatient Hospital Stay (HOSPITAL_BASED_OUTPATIENT_CLINIC_OR_DEPARTMENT_OTHER): Payer: Medicare Other

## 2019-09-08 ENCOUNTER — Encounter (HOSPITAL_COMMUNITY): Payer: Self-pay | Admitting: Obstetrics & Gynecology

## 2019-09-08 DIAGNOSIS — O99513 Diseases of the respiratory system complicating pregnancy, third trimester: Secondary | ICD-10-CM | POA: Insufficient documentation

## 2019-09-08 DIAGNOSIS — O4693 Antepartum hemorrhage, unspecified, third trimester: Secondary | ICD-10-CM | POA: Insufficient documentation

## 2019-09-08 DIAGNOSIS — K219 Gastro-esophageal reflux disease without esophagitis: Secondary | ICD-10-CM | POA: Diagnosis not present

## 2019-09-08 DIAGNOSIS — O09523 Supervision of elderly multigravida, third trimester: Secondary | ICD-10-CM | POA: Diagnosis not present

## 2019-09-08 DIAGNOSIS — O34212 Maternal care for vertical scar from previous cesarean delivery: Secondary | ICD-10-CM | POA: Insufficient documentation

## 2019-09-08 DIAGNOSIS — Z79899 Other long term (current) drug therapy: Secondary | ICD-10-CM | POA: Insufficient documentation

## 2019-09-08 DIAGNOSIS — J45909 Unspecified asthma, uncomplicated: Secondary | ICD-10-CM | POA: Insufficient documentation

## 2019-09-08 DIAGNOSIS — Z7982 Long term (current) use of aspirin: Secondary | ICD-10-CM | POA: Diagnosis not present

## 2019-09-08 DIAGNOSIS — Z6841 Body Mass Index (BMI) 40.0 and over, adult: Secondary | ICD-10-CM

## 2019-09-08 DIAGNOSIS — Z7951 Long term (current) use of inhaled steroids: Secondary | ICD-10-CM | POA: Diagnosis not present

## 2019-09-08 DIAGNOSIS — O99613 Diseases of the digestive system complicating pregnancy, third trimester: Secondary | ICD-10-CM | POA: Insufficient documentation

## 2019-09-08 DIAGNOSIS — Z87891 Personal history of nicotine dependence: Secondary | ICD-10-CM | POA: Insufficient documentation

## 2019-09-08 DIAGNOSIS — Z3689 Encounter for other specified antenatal screening: Secondary | ICD-10-CM

## 2019-09-08 DIAGNOSIS — Z3A30 30 weeks gestation of pregnancy: Secondary | ICD-10-CM | POA: Diagnosis not present

## 2019-09-08 DIAGNOSIS — O99213 Obesity complicating pregnancy, third trimester: Secondary | ICD-10-CM | POA: Diagnosis not present

## 2019-09-08 DIAGNOSIS — Z9049 Acquired absence of other specified parts of digestive tract: Secondary | ICD-10-CM | POA: Diagnosis not present

## 2019-09-08 LAB — CBC WITH DIFFERENTIAL/PLATELET
Abs Immature Granulocytes: 0.04 10*3/uL (ref 0.00–0.07)
Basophils Absolute: 0 10*3/uL (ref 0.0–0.1)
Basophils Relative: 0 %
Eosinophils Absolute: 0.1 10*3/uL (ref 0.0–0.5)
Eosinophils Relative: 1 %
HCT: 29 % — ABNORMAL LOW (ref 36.0–46.0)
Hemoglobin: 9 g/dL — ABNORMAL LOW (ref 12.0–15.0)
Immature Granulocytes: 1 %
Lymphocytes Relative: 19 %
Lymphs Abs: 1.6 10*3/uL (ref 0.7–4.0)
MCH: 25.1 pg — ABNORMAL LOW (ref 26.0–34.0)
MCHC: 31 g/dL (ref 30.0–36.0)
MCV: 81 fL (ref 80.0–100.0)
Monocytes Absolute: 0.7 10*3/uL (ref 0.1–1.0)
Monocytes Relative: 8 %
Neutro Abs: 6.1 10*3/uL (ref 1.7–7.7)
Neutrophils Relative %: 71 %
Platelets: 371 10*3/uL (ref 150–400)
RBC: 3.58 MIL/uL — ABNORMAL LOW (ref 3.87–5.11)
RDW: 16.4 % — ABNORMAL HIGH (ref 11.5–15.5)
WBC: 8.5 10*3/uL (ref 4.0–10.5)
nRBC: 0 % (ref 0.0–0.2)

## 2019-09-08 LAB — URINALYSIS, ROUTINE W REFLEX MICROSCOPIC
Bilirubin Urine: NEGATIVE
Glucose, UA: NEGATIVE mg/dL
Hgb urine dipstick: NEGATIVE
Ketones, ur: NEGATIVE mg/dL
Nitrite: NEGATIVE
Protein, ur: NEGATIVE mg/dL
Specific Gravity, Urine: 1.016 (ref 1.005–1.030)
pH: 7 (ref 5.0–8.0)

## 2019-09-08 LAB — WET PREP, GENITAL
Clue Cells Wet Prep HPF POC: NONE SEEN
Sperm: NONE SEEN
Trich, Wet Prep: NONE SEEN
Yeast Wet Prep HPF POC: NONE SEEN

## 2019-09-08 NOTE — MAU Note (Signed)
[name] is a 39 y.o. at [redacted]w[redacted]d here in MAU reporting: this morning saw some bleeding when using the bathroom, states she was trying to have a BM. Has used the bathroom a couple time since then and she has seen more bleeding but states less than the first time. No LOF. No pain. +FM  Onset of complaint: today  Pain score: 0/10  Vitals:   09/08/19 1056  BP: 99/73  Pulse: 80  Resp: 18  Temp: 99 F (37.2 C)  SpO2: 97%     FHT: +FM  Lab orders placed from triage: UA

## 2019-09-08 NOTE — MAU Provider Note (Signed)
History     CSN: [id number]  Arrival date and time: 09/08/19 1036   First Provider Initiated Contact with Patient 09/08/19 1126      Chief Complaint  Patient presents with  . Vaginal Bleeding   [name] is a 39 y.o. [id number] at [redacted]w[redacted]d who presents today with vaginal bleeding. She states that this started this morning after straining for a BM. She denies any pain or contractions. She reports normal fetal movement. She has had sciatica pain during the pregnancy. She was just diagnosed with GDM. She has had 3 prior c-sections. She is just getting over covid.   Vaginal Bleeding The patient's primary symptoms include vaginal bleeding. This is a new problem. The current episode started today. The problem occurs intermittently. The problem has been gradually improving. She is pregnant. The vaginal discharge was bloody. The vaginal bleeding is spotting. She has not been passing clots. She has not been passing tissue. Nothing aggravates the symptoms. She has tried nothing for the symptoms. Sexual activity: denies intercourse or anything in the vagina in the last 24 hours.     OB History    Gravida  8   Para  3   Term  3   Preterm  0   AB  4   Living  3     SAB  2   TAB  2   Ectopic  0   Multiple  0   Live Births  3           Past Medical History:  Diagnosis Date  . Asthma     reservations to 2-3 timmes  a year that required ED visits/hospitalizations. Never intubated  . GERD (gastroesophageal reflux disease)   . Seasonal allergies     Past Surgical History:  Procedure Laterality Date  . CESAREAN SECTION    . CESAREAN SECTION N/A 10/25/2012   Procedure: CESAREAN SECTION;  Surgeon: Dorien Chihuahua. Richardson Dopp, MD;  Location: [facility];  Service: Obstetrics;  Laterality: N/A;  . CESAREAN SECTION N/A 03/05/2014   Procedure: CESAREAN SECTION;  Surgeon: Purcell Nails, MD;  Location: [facility];  Service: Obstetrics;  Laterality: N/A;  . CHOLECYSTECTOMY N/A 04/09/2017   Procedure:  LAPAROSCOPIC CHOLECYSTECTOMY;  Surgeon: Axel Filler, MD;  Location: [facility] OR;  Service: General;  Laterality: N/A;  . FRACTURE SURGERY     left middle finger    Family History  Problem Relation Age of Onset  . Hypertension Mother   . Allergies Mother   . Asthma Father   . Sleep apnea Father   . Hyperlipidemia Father   . Emphysema Maternal Grandfather   . Allergies Maternal Grandmother   . Asthma Maternal Grandmother   . Diabetes Maternal Grandmother     Social History   Tobacco Use  . Smoking status: Former Smoker    Packs/day: 0.30    Years: 0.50    Pack years: 0.15    Types: Cigarettes    Quit date: 01/15/2001    Years since quitting: 18.6  . Smokeless tobacco: Never Used  Vaping Use  . Vaping Use: Never used  Substance Use Topics  . Alcohol use: No    Comment: occasionaly  . Drug use: No    Allergies:  Allergies  Allergen Reactions  . Iohexol Hives and Itching       . Moxifloxacin Hives and Itching    Medications Prior to Admission  Medication Sig Dispense Refill Last Dose  . acetaminophen (TYLENOL) 500 MG tablet Take 500  mg by mouth every 8 (eight) hours as needed. 1-2 qd - bid   Past Week at Unknown time  . albuterol (PROVENTIL) (2.5 MG/3ML) 0.083% nebulizer solution Take 3 mLs (2.5 mg total) by nebulization every 6 (six) hours as needed for wheezing or shortness of breath. 75 mL 12 09/07/2019 at Unknown time  . aspirin EC 81 MG tablet Take 81 mg by mouth daily.   Past Week at Unknown time  . budesonide-formoterol (SYMBICORT) 160-4.5 MCG/ACT inhaler Inhale 2 puffs into the lungs 2 (two) times daily. 3 Inhaler 11 09/08/2019 at Unknown time  . cetirizine (ZYRTEC) 10 MG tablet Take 1 tablet (10 mg total) by mouth daily. 90 tablet 1 Past Month at Unknown time  . montelukast (SINGULAIR) 10 MG tablet TAKE 10 MG BY MOUTH AT BEDTIME 90 tablet 1 09/07/2019 at Unknown time  . ondansetron (ZOFRAN ODT) 4 MG disintegrating tablet Take 1 tablet (4 mg total) by mouth every 8  (eight) hours as needed for nausea or vomiting. 10 tablet 0 Past Month at Unknown time  . Prenatal Vit-Fe Fumarate-FA (PRENATAL MULTIVITAMIN) TABS tablet Take 1 tablet by mouth daily at 12 noon.   09/07/2019 at Unknown time  . PROAIR HFA 108 (90 Base) MCG/ACT inhaler INHALE 2 PUFFS BY MOUTH EVERY 4 HOURS AS NEEDED FOR WHEEZING AND FOR SHORTNESS OF BREATH 18 g 2 09/07/2019 at Unknown time    Review of Systems  Genitourinary: Positive for vaginal bleeding.   Physical Exam   Blood pressure 99/73, pulse 80, temperature 99 F (37.2 C), temperature source Oral, resp. rate 18, height 5\' 6"  (1.676 m), weight 121.4 kg, last menstrual period 02/09/2019, SpO2 97 %.  Physical Exam Vitals and nursing note reviewed. Exam conducted with a chaperone present.  Constitutional:      Appearance: Normal appearance.  HENT:     Head: Normocephalic.  Cardiovascular:     Rate and Rhythm: Normal rate.  Pulmonary:     Effort: Pulmonary effort is normal.  Abdominal:     General: Abdomen is flat.     Tenderness: There is no abdominal tenderness.  Genitourinary:    Comments:  External: no lesion Vagina: small amount of white discharge Cervix: pink, smooth, closed/thick  Uterus: AGA  Skin:    General: Skin is warm and dry.  Neurological:     Mental Status: She is alert and oriented to person, place, and time.  Psychiatric:        Mood and Affect: Mood normal.        Behavior: Behavior normal.      NST:  Baseline: 125 Variability: moderate Accels: 15x15 Decels: none Toco: none Reactive/Appropriate for GA  02/11/2019: normal fluid, no sign of abruption or previa.   Results for orders placed or performed during the hospital encounter of 09/08/19 (from the past 24 hour(s))  Urinalysis, Routine w reflex microscopic Urine, Clean Catch     Status: Abnormal   Collection Time: 09/08/19 11:48 AM  Result Value Ref Range   Color, Urine YELLOW YELLOW   APPearance HAZY (A) CLEAR   Specific Gravity, Urine 1.016  1.005 - 1.030   pH 7.0 5.0 - 8.0   Glucose, UA NEGATIVE NEGATIVE mg/dL   Hgb urine dipstick NEGATIVE NEGATIVE   Bilirubin Urine NEGATIVE NEGATIVE   Ketones, ur NEGATIVE NEGATIVE mg/dL   Protein, ur NEGATIVE NEGATIVE mg/dL   Nitrite NEGATIVE NEGATIVE   Leukocytes,Ua TRACE (A) NEGATIVE   RBC / HPF 6-10 0 - 5 RBC/hpf   WBC,  UA 21-50 0 - 5 WBC/hpf   Bacteria, UA RARE (A) NONE SEEN   Squamous Epithelial / LPF 0-5 0 - 5   Mucus PRESENT   Wet prep, genital     Status: Abnormal   Collection Time: 09/08/19 11:50 AM  Result Value Ref Range   Yeast Wet Prep HPF POC NONE SEEN NONE SEEN   Trich, Wet Prep NONE SEEN NONE SEEN   Clue Cells Wet Prep HPF POC NONE SEEN NONE SEEN   WBC, Wet Prep HPF POC MANY (A) NONE SEEN   Sperm NONE SEEN   CBC with Differential/Platelet     Status: Abnormal   Collection Time: 09/08/19 12:43 PM  Result Value Ref Range   WBC 8.5 4.0 - 10.5 K/uL   RBC 3.58 (L) 3.87 - 5.11 MIL/uL   Hemoglobin 9.0 (L) 12.0 - 15.0 g/dL   HCT 82.5 (L) 36 - 46 %   MCV 81.0 80.0 - 100.0 fL   MCH 25.1 (L) 26.0 - 34.0 pg   MCHC 31.0 30.0 - 36.0 g/dL   RDW 05.3 (H) 97.6 - 73.4 %   Platelets 371 150 - 400 K/uL   nRBC 0.0 0.0 - 0.2 %   Neutrophils Relative % 71 %   Neutro Abs 6.1 1.7 - 7.7 K/uL   Lymphocytes Relative 19 %   Lymphs Abs 1.6 0.7 - 4.0 K/uL   Monocytes Relative 8 %   Monocytes Absolute 0.7 0 - 1 K/uL   Eosinophils Relative 1 %   Eosinophils Absolute 0.1 0 - 0 K/uL   Basophils Relative 0 %   Basophils Absolute 0.0 0 - 0 K/uL   Immature Granulocytes 1 %   Abs Immature Granulocytes 0.04 0.00 - 0.07 K/uL    MAU Course  Procedures  MDM Patient reports that she needs to leave by around 1:45 in order to be able to pick up her children from school. Reviewed [place] that shows no sign of any problems. FHR has been reactive. No contractions tracing and cervix closed. Discussed that bleeding could have been from an internal hemorrhoid due to straining from BM.   Assessment and  Plan   1. Vaginal bleeding in pregnancy, third trimester   2. NST (non-stress test) reactive   3. [redacted] weeks gestation of pregnancy    DC home Comfort measures reviewed  3rd Trimester precautions  Bleeding precautions PTL precautions  Fetal kick counts RX: none  Return to MAU as needed FU with OB as planned   Follow-up Information    [facility] Follow up.   Specialty: Obstetrics and Gynecology Contact information: [address] [place] [address] [phone]             Thressa Sheller DNP, CNM  09/08/19  1:41 PM

## 2019-09-08 NOTE — Discharge Instructions (Signed)
Third Trimester of Pregnancy The third trimester is from week 28 through week 40 (months 7 through 9). The third trimester is a time when the unborn baby (fetus) is growing rapidly. At the end of the ninth month, the fetus is about 20 inches in length and weighs 6-10 pounds. Body changes during your third trimester Your body will continue to go through many changes during pregnancy. The changes vary from woman to woman. During the third trimester:  Your weight will continue to increase. You can expect to gain 25-35 pounds (11-16 kg) by the end of the pregnancy.  You may begin to get stretch marks on your hips, abdomen, and breasts.  You may urinate more often because the fetus is moving lower into your pelvis and pressing on your bladder.  You may develop or continue to have heartburn. This is caused by increased hormones that slow down muscles in the digestive tract.  You may develop or continue to have constipation because increased hormones slow digestion and cause the muscles that push waste through your intestines to relax.  You may develop hemorrhoids. These are swollen veins (varicose veins) in the rectum that can itch or be painful.  You may develop swollen, bulging veins (varicose veins) in your legs.  You may have increased body aches in the pelvis, back, or thighs. This is due to weight gain and increased hormones that are relaxing your joints.  You may have changes in your hair. These can include thickening of your hair, rapid growth, and changes in texture. Some women also have hair loss during or after pregnancy, or hair that feels dry or thin. Your hair will most likely return to normal after your baby is born.  Your breasts will continue to grow and they will continue to become tender. A yellow fluid (colostrum) may leak from your breasts. This is the first milk you are producing for your baby.  Your belly button may stick out.  You may notice more swelling in your hands,  face, or ankles.  You may have increased tingling or numbness in your hands, arms, and legs. The skin on your belly may also feel numb.  You may feel short of breath because of your expanding uterus.  You may have more problems sleeping. This can be caused by the size of your belly, increased need to urinate, and an increase in your body's metabolism.  You may notice the fetus "dropping," or moving lower in your abdomen (lightening).  You may have increased vaginal discharge.  You may notice your joints feel loose and you may have pain around your pelvic bone. What to expect at prenatal visits You will have prenatal exams every 2 weeks until week 36. Then you will have weekly prenatal exams. During a routine prenatal visit:  You will be weighed to make sure you and the baby are growing normally.  Your blood pressure will be taken.  Your abdomen will be measured to track your baby's growth.  The fetal heartbeat will be listened to.  Any test results from the previous visit will be discussed.  You may have a cervical check near your due date to see if your cervix has softened or thinned (effaced).  You will be tested for Group B streptococcus. This happens between 35 and 37 weeks. Your health care provider may ask you:  What your birth plan is.  How you are feeling.  If you are feeling the baby move.  If you have had any abnormal   symptoms, such as leaking fluid, bleeding, severe headaches, or abdominal cramping.  If you are using any tobacco products, including cigarettes, chewing tobacco, and electronic cigarettes.  If you have any questions. Other tests or screenings that may be performed during your third trimester include:  Blood tests that check for low iron levels (anemia).  Fetal testing to check the health, activity level, and growth of the fetus. Testing is done if you have certain medical conditions or if there are problems during the pregnancy.  Nonstress test  (NST). This test checks the health of your baby to make sure there are no signs of problems, such as the baby not getting enough oxygen. During this test, a belt is placed around your belly. The baby is made to move, and its heart rate is monitored during movement. What is false labor? False labor is a condition in which you feel small, irregular tightenings of the muscles in the womb (contractions) that usually go away with rest, changing position, or drinking water. These are called Braxton Hicks contractions. Contractions may last for hours, days, or even weeks before true labor sets in. If contractions come at regular intervals, become more frequent, increase in intensity, or become painful, you should see your health care provider. What are the signs of labor?  Abdominal cramps.  Regular contractions that start at 10 minutes apart and become stronger and more frequent with time.  Contractions that start on the top of the uterus and spread down to the lower abdomen and back.  Increased pelvic pressure and dull back pain.  A watery or bloody mucus discharge that comes from the vagina.  Leaking of amniotic fluid. This is also known as your "water breaking." It could be a slow trickle or a gush. Let your health care provider know if it has a color or strange odor. If you have any of these signs, call your health care provider right away, even if it is before your due date. Follow these instructions at home: Medicines  Follow your health care provider's instructions regarding medicine use. Specific medicines may be either safe or unsafe to take during pregnancy.  Take a prenatal vitamin that contains at least 600 micrograms (mcg) of folic acid.  If you develop constipation, try taking a stool softener if your health care provider approves. Eating and drinking   Eat a balanced diet that includes fresh fruits and vegetables, whole grains, good sources of protein such as meat, eggs, or tofu,  and low-fat dairy. Your health care provider will help you determine the amount of weight gain that is right for you.  Avoid raw meat and uncooked cheese. These carry germs that can cause birth defects in the baby.  If you have low calcium intake from food, talk to your health care provider about whether you should take a daily calcium supplement.  Eat four or five small meals rather than three large meals a day.  Limit foods that are high in fat and processed sugars, such as fried and sweet foods.  To prevent constipation: ? Drink enough fluid to keep your urine clear or pale yellow. ? Eat foods that are high in fiber, such as fresh fruits and vegetables, whole grains, and beans. Activity  Exercise only as directed by your health care provider. Most women can continue their usual exercise routine during pregnancy. Try to exercise for 30 minutes at least 5 days a week. Stop exercising if you experience uterine contractions.  Avoid heavy lifting.  Do   not exercise in extreme heat or humidity, or at high altitudes.  Wear low-heel, comfortable shoes.  Practice good posture.  You may continue to have sex unless your health care provider tells you otherwise. Relieving pain and discomfort  Take frequent breaks and rest with your legs elevated if you have leg cramps or low back pain.  Take warm sitz baths to soothe any pain or discomfort caused by hemorrhoids. Use hemorrhoid cream if your health care provider approves.  Wear a good support bra to prevent discomfort from breast tenderness.  If you develop varicose veins: ? Wear support pantyhose or compression stockings as told by your healthcare provider. ? Elevate your feet for 15 minutes, 3-4 times a day. Prenatal care  Write down your questions. Take them to your prenatal visits.  Keep all your prenatal visits as told by your health care provider. This is important. Safety  Wear your seat belt at all times when driving.  Make  a list of emergency phone numbers, including numbers for family, friends, the hospital, and police and fire departments. General instructions  Avoid cat litter boxes and soil used by cats. These carry germs that can cause birth defects in the baby. If you have a cat, ask someone to clean the litter box for you.  Do not travel far distances unless it is absolutely necessary and only with the approval of your health care provider.  Do not use hot tubs, steam rooms, or saunas.  Do not drink alcohol.  Do not use any products that contain nicotine or tobacco, such as cigarettes and e-cigarettes. If you need help quitting, ask your health care provider.  Do not use any medicinal herbs or unprescribed drugs. These chemicals affect the formation and growth of the baby.  Do not douche or use tampons or scented sanitary pads.  Do not cross your legs for long periods of time.  To prepare for the arrival of your baby: ? Take prenatal classes to understand, practice, and ask questions about labor and delivery. ? Make a trial run to the hospital. ? Visit the hospital and tour the maternity area. ? Arrange for maternity or paternity leave through employers. ? Arrange for family and friends to take care of pets while you are in the hospital. ? Purchase a rear-facing car seat and make sure you know how to install it in your car. ? Pack your hospital bag. ? Prepare the baby's nursery. Make sure to remove all pillows and stuffed animals from the baby's crib to prevent suffocation.  Visit your dentist if you have not gone during your pregnancy. Use a soft toothbrush to brush your teeth and be gentle when you floss. Contact a health care provider if:  You are unsure if you are in labor or if your water has broken.  You become dizzy.  You have mild pelvic cramps, pelvic pressure, or nagging pain in your abdominal area.  You have lower back pain.  You have persistent nausea, vomiting, or  diarrhea.  You have an unusual or bad smelling vaginal discharge.  You have pain when you urinate. Get help right away if:  Your water breaks before 37 weeks.  You have regular contractions less than 5 minutes apart before 37 weeks.  You have a fever.  You are leaking fluid from your vagina.  You have spotting or bleeding from your vagina.  You have severe abdominal pain or cramping.  You have rapid weight loss or weight gain.  You have   shortness of breath with chest pain.  You notice sudden or extreme swelling of your face, hands, ankles, feet, or legs.  Your baby makes fewer than 10 movements in 2 hours.  You have severe headaches that do not go away when you take medicine.  You have vision changes. Summary  The third trimester is from week 28 through week 40, months 7 through 9. The third trimester is a time when the unborn baby (fetus) is growing rapidly.  During the third trimester, your discomfort may increase as you and your baby continue to gain weight. You may have abdominal, leg, and back pain, sleeping problems, and an increased need to urinate.  During the third trimester your breasts will keep growing and they will continue to become tender. A yellow fluid (colostrum) may leak from your breasts. This is the first milk you are producing for your baby.  False labor is a condition in which you feel small, irregular tightenings of the muscles in the womb (contractions) that eventually go away. These are called Braxton Hicks contractions. Contractions may last for hours, days, or even weeks before true labor sets in.  Signs of labor can include: abdominal cramps; regular contractions that start at 10 minutes apart and become stronger and more frequent with time; watery or bloody mucus discharge that comes from the vagina; increased pelvic pressure and dull back pain; and leaking of amniotic fluid. This information is not intended to replace advice given to you by your  health care provider. Make sure you discuss any questions you have with your health care provider. Document Revised: 04/24/2018 Document Reviewed: 02/07/2016 Elsevier Patient Education  2020 [employer]  

## 2019-09-09 LAB — GC/CHLAMYDIA PROBE AMP (~~LOC~~) NOT AT ARMC
Chlamydia: NEGATIVE
Comment: NEGATIVE
Comment: NORMAL
Neisseria Gonorrhea: NEGATIVE

## 2019-09-15 ENCOUNTER — Other Ambulatory Visit: Payer: Self-pay

## 2019-09-15 ENCOUNTER — Other Ambulatory Visit: Payer: Self-pay | Admitting: Obstetrics and Gynecology

## 2019-09-15 ENCOUNTER — Other Ambulatory Visit: Payer: Medicare Other

## 2019-09-15 DIAGNOSIS — Z20822 Contact with and (suspected) exposure to covid-19: Secondary | ICD-10-CM | POA: Diagnosis not present

## 2019-09-16 LAB — NOVEL CORONAVIRUS, NAA: SARS-CoV-2, NAA: NOT DETECTED

## 2019-09-23 ENCOUNTER — Encounter: Payer: Self-pay | Admitting: Registered"

## 2019-09-23 ENCOUNTER — Other Ambulatory Visit: Payer: Self-pay

## 2019-09-23 ENCOUNTER — Encounter: Payer: Medicare Other | Attending: Obstetrics and Gynecology | Admitting: Registered"

## 2019-09-23 DIAGNOSIS — O24419 Gestational diabetes mellitus in pregnancy, unspecified control: Secondary | ICD-10-CM | POA: Diagnosis present

## 2019-09-23 NOTE — Progress Notes (Signed)
Patient was seen on 09/23/19 for Gestational Diabetes self-management class at the [facility]. The following learning objectives were met by the patient during this course:   States the definition of Gestational Diabetes  States why dietary management is important in controlling blood glucose  Describes the effects each nutrient has on blood glucose levels  Demonstrates ability to create a balanced meal plan  Demonstrates carbohydrate counting   States when to check blood glucose levels  Demonstrates proper blood glucose monitoring techniques  States the effect of stress and exercise on blood glucose levels  States the importance of limiting caffeine and abstaining from alcohol and smoking  Blood glucose monitor given: Accu-chek Guide Me Lot #[id number] Exp: 10/06/2020 CBG: 81 mg/dL  Patient instructed to monitor glucose levels: FBS: 60 - <95; 1 hour: <140; 2 hour: <120  Patient received handouts:  Nutrition Diabetes and Pregnancy, including carb counting list  Patient will be seen for follow-up as needed.

## 2019-10-01 ENCOUNTER — Telehealth: Payer: Self-pay | Admitting: Family Medicine

## 2019-10-01 NOTE — Telephone Encounter (Signed)
Reviewed form and placed in PCP's box for completion.  .Mikella Linsley R Deward Sebek, CMA  

## 2019-10-01 NOTE — Telephone Encounter (Signed)
Medical Certification for Application & Renewal of Disability Parking Placard form dropped off for at front desk for completion.  Verified that patient section of form has been completed.  Last DOS/WCC with PCP was 08/31/19.  Placed form in red team folder to be completed by clinical staff.  [name]

## 2019-10-05 NOTE — Telephone Encounter (Signed)
Called patient to discuss Needs this due to sciatica pain and shortness of breath from her asthma, has been discussing both of these issues with her OB provider Agreed to provide temporary 6 month placard Reviewed benefits of exercise  Form completed, will return to [facility] RN team [name], MD

## 2019-10-05 NOTE — Telephone Encounter (Signed)
Patient contacted and informed of from ready for pick up. Copy made for batch scanning.

## 2019-10-26 ENCOUNTER — Encounter (HOSPITAL_COMMUNITY): Payer: Self-pay

## 2019-10-26 NOTE — Patient Instructions (Signed)
[name]  10/26/2019   Your procedure is scheduled on:  10.22.2021  Arrive at 1030 at Entrance C on [address] at [facility]  and [employer]. You are invited to use the FREE valet parking or use the Visitor's parking deck.  Pick up the phone at the desk and dial [phone].  Call this number if you have problems the morning of surgery: [phone]  Remember:   Do not eat food:(After Midnight) Desps de medianoche.  Do not drink clear liquids: (After Midnight) Desps de medianoche.  Take these medicines the morning of surgery with A SIP OF WATER:  Use and Bring your inhalers as prescribed.   Do not wear jewelry, make-up or nail polish.  Do not wear lotions, powders, or perfumes. Do not wear deodorant.  Do not shave 48 hours prior to surgery.  Do not bring valuables to the hospital.  [facility] is not   responsible for any belongings or valuables brought to the hospital.  Contacts, dentures or bridgework may not be worn into surgery.  Leave suitcase in the car. After surgery it may be brought to your room.  For patients admitted to the hospital, checkout time is 11:00 AM the day of              discharge.      Please read over the following fact sheets that you were given:     Preparing for Surgery

## 2019-10-30 ENCOUNTER — Other Ambulatory Visit: Payer: Self-pay | Admitting: Obstetrics and Gynecology

## 2019-11-04 ENCOUNTER — Other Ambulatory Visit (HOSPITAL_COMMUNITY)
Admission: RE | Admit: 2019-11-04 | Discharge: 2019-11-04 | Disposition: A | Discharge status: Home / Self Care | Payer: Medicare Other | Source: Ambulatory Visit | Attending: Obstetrics & Gynecology | Admitting: Obstetrics & Gynecology

## 2019-11-04 ENCOUNTER — Other Ambulatory Visit (HOSPITAL_COMMUNITY)
Admission: RE | Admit: 2019-11-04 | Discharge: 2019-11-04 | Disposition: A | Discharge status: Home / Self Care | Payer: Medicare Other | Source: Ambulatory Visit | Attending: Obstetrics and Gynecology | Admitting: Obstetrics and Gynecology

## 2019-11-04 ENCOUNTER — Other Ambulatory Visit (HOSPITAL_COMMUNITY): Payer: Medicare Other

## 2019-11-04 ENCOUNTER — Other Ambulatory Visit: Payer: Self-pay

## 2019-11-04 DIAGNOSIS — Z20822 Contact with and (suspected) exposure to covid-19: Secondary | ICD-10-CM | POA: Insufficient documentation

## 2019-11-04 DIAGNOSIS — N96 Recurrent pregnancy loss: Secondary | ICD-10-CM | POA: Diagnosis not present

## 2019-11-04 DIAGNOSIS — Z01818 Encounter for other preprocedural examination: Secondary | ICD-10-CM | POA: Insufficient documentation

## 2019-11-04 DIAGNOSIS — O09529 Supervision of elderly multigravida, unspecified trimester: Secondary | ICD-10-CM

## 2019-11-04 LAB — CBC
HCT: 32 % — ABNORMAL LOW (ref 36.0–46.0)
Hemoglobin: 10.2 g/dL — ABNORMAL LOW (ref 12.0–15.0)
MCH: 26.1 pg (ref 26.0–34.0)
MCHC: 31.9 g/dL (ref 30.0–36.0)
MCV: 81.8 fL (ref 80.0–100.0)
Platelets: 323 10*3/uL (ref 150–400)
RBC: 3.91 MIL/uL (ref 3.87–5.11)
RDW: 17 % — ABNORMAL HIGH (ref 11.5–15.5)
WBC: 8.4 10*3/uL (ref 4.0–10.5)
nRBC: 0 % (ref 0.0–0.2)

## 2019-11-04 LAB — BASIC METABOLIC PANEL
Anion gap: 10 (ref 5–15)
BUN: 8 mg/dL (ref 6–20)
CO2: 17 mmol/L — ABNORMAL LOW (ref 22–32)
Calcium: 9.2 mg/dL (ref 8.9–10.3)
Chloride: 106 mmol/L (ref 98–111)
Creatinine, Ser: 0.6 mg/dL (ref 0.44–1.00)
GFR, Estimated: >60 mL/min (ref >60)
Glucose, Bld: 114 mg/dL — ABNORMAL HIGH (ref 70–99)
Potassium: 3.7 mmol/L (ref 3.5–5.1)
Sodium: 133 mmol/L — ABNORMAL LOW (ref 135–145)

## 2019-11-04 LAB — SARS CORONAVIRUS 2 (TAT 6-24 HRS): SARS Coronavirus 2: NEGATIVE

## 2019-11-04 NOTE — H&P (Signed)
[name] is a 39 y.o. female, [id number] at 73 weeks, presenting on 11/06/19 for scheduled repeat cesarean delivery and tubal sterilization.  History of prior C/S x 3, so cell-saver has been requested to be available and a 2nd surgeon will be present as Geophysicist/field seismologist.    Patient Active Problem List   Diagnosis Date Noted  . History of multiple miscarriages x 2 11/04/2019  . AMA (advanced maternal age) multigravida 35+ 11/04/2019  . Gestational diabetes mellitus (GDM), antepartum 09/23/2019  . Pneumonia due to COVID-19 virus 08/31/2019  . Peptic ulcer disease 02/01/2019  . Acute bilateral low back pain with bilateral sciatica 09/10/2016  . Asthma 06/22/2010  . Morbid obesity with BMI of 40.0-44.9, adult (HCC) 05/24/2006    History of present pregnancy: Patient entered care with [facility] at 22 weeks, in transfer from [facility].   EDC of 11/16/19 was established by LMP, c/w [place] at 7 5/7 weeks.   Anatomy scan:  19 weeks, with limited anatomy, overall normal findings and an anterior placenta.   Additional [place] evaluations:  F/u US for anatomy at [facility], WNL 31 weeks--vtx, normal fluid, EFW 4 lbs, 57%ile 36 weeks--vtx, normal fluid, EFW 6 lbs, 28%ile, lagging FL < 2%ile. Followed with weekly antenatal testing from 32 weeks--all WNL. Significant prenatal events:  On Flexeril for chronic back pain, followed by Dr. Sherene Sires for asthma.  Severe sciatica since early pregnancy.  ASA recommended due to BMI.  Dx COVID at 28 weeks, managed as outpatient.  Dx of GDM at 29 weeks, referred to [facility].  Scheduled for repeat C/S, with bilateral salpingectomy.  CBGs remained WNL per patient report, no meds.  GBS positive.   Last evaluation:  10/30/19--BP 116/76, weight 265, BPP 8/8, normal fluid.  OB History    Gravida  8   Para  3   Term  3   Preterm  0   AB  4   Living  3     SAB  2   TAB  2   Ectopic  0   Multiple  0   Live Births  3         2004--Primary C/S, 42 weeks, female, 7+4, [facility]--TAB at 6 weeks 2010--TAB at 6 weeks 2013--SAB at 4 weeks 2014--SAB at 4 weeks 2014--Repeat C/S, 39.2 weeks, female, 7+8, with Dr. Richardson Dopp for [facility] 2016--Repeat C/S, 39 weeks, female, 6+8, with AR  Past Medical History:  Diagnosis Date  . Asthma     reservations to 2-3 timmes  a year that required ED visits/hospitalizations. Never intubated  . GERD (gastroesophageal reflux disease)   . Seasonal allergies    Past Surgical History:  Procedure Laterality Date  . CESAREAN SECTION    . CESAREAN SECTION N/A 10/25/2012   Procedure: CESAREAN SECTION;  Surgeon: Dorien Chihuahua. Richardson Dopp, MD;  Location: [facility]S;  Service: Obstetrics;  Laterality: N/A;  . CESAREAN SECTION N/A 03/05/2014   Procedure: CESAREAN SECTION;  Surgeon: Purcell Nails, MD;  Location: [facility];  Service: Obstetrics;  Laterality: N/A;  . CHOLECYSTECTOMY N/A 04/09/2017   Procedure: LAPAROSCOPIC CHOLECYSTECTOMY;  Surgeon: Axel Filler, MD;  Location: [facility] OR;  Service: General;  Laterality: N/A;  . FRACTURE SURGERY     left middle finger   Family History: family history includes Allergies in her maternal grandmother and mother; Asthma in her maternal grandmother; Diabetes in her maternal grandmother; Hyperlipidemia in her father; Hypertension in her mother; Sleep apnea in her father. Social History:  reports that  she quit smoking about 18 years ago. Her smoking use included cigarettes. She has a 0.15 pack-year smoking history. She has never used smokeless tobacco. She reports that she does not drink alcohol and does not use drugs.  Patient is single, FOB, Tresa Endo, is involved and supportive.  Patient has 2 years of college, on disability.   Prenatal Transfer Tool  Maternal Diabetes: GDM, no meds Genetic Screening: Normal AFP, declined NIPS Maternal Ultrasounds/Referrals: Normal Fetal Ultrasounds or other Referrals:  None Maternal Substance Abuse:  No Significant Maternal Medications:  Flexeril Significant Maternal Lab  Results: Group B Strep positive  TDAP declined Flu declined  ROS:  Reports chronic back pain, denies bleeding, leaking, or any other sx, reports positive fetal movement  Allergies  Allergen Reactions  . Iohexol Hives and Itching       . Moxifloxacin Hives and Itching       Last menstrual period 02/09/2019.  Chest clear Heart RRR without murmur Abd gravid, NT, FH 38 cm Pelvic: deferred Ext: WNL  FHR: FHR 150 on last office assessment UCs:  Occasional  Prenatal labs: ABO, Rh: O/Positive/-- (03/10 0000) Antibody: Negative (03/10 0000) Rubella:  Immune (03/10 0000) RPR: Nonreactive (03/10 0000)  HBsAg: Negative (03/10 0000)  HIV: Non-reactive (03/10 0000)  GBS:  Positive on 10/20/19 Sickle cell/Hgb electrophoresis:  AA Pap:  04/2019 WNL GC:  Neg 04/23/19 Chlamydia: Neg 04/23/19 Genetic screenings:  Normal AFP, declined NIPS Glucola: Elevated 1 hour and 3 hour Other:   Hgb 11.3 at NOB       Assessment/Plan: IUP at 39 weeks Prior C/S x 3, desires repeat with bilateral salpingectomy Gestational diabetes, diet controlled AMA BMI 43 Asthma Chronic back pain GBS postive  Plan: [facility] with Dr. Normand Sloop for scheduled repeat C/S and bilateral salpingectomy. Dr. Su Hilt and Dr. Normand Sloop as surgeons, due to risk of adhesive disease, with cell-saver available. Routine [facility] pre-op orders  Nigel Bridgeman, CNM, [place] 11/04/2019, 6:53 AM

## 2019-11-05 LAB — RPR: RPR Ser Ql: NONREACTIVE

## 2019-11-06 ENCOUNTER — Inpatient Hospital Stay (HOSPITAL_COMMUNITY): Payer: Medicare Other | Admitting: Anesthesiology

## 2019-11-06 ENCOUNTER — Encounter (HOSPITAL_COMMUNITY): Payer: Self-pay | Admitting: Obstetrics and Gynecology

## 2019-11-06 ENCOUNTER — Other Ambulatory Visit: Payer: Self-pay

## 2019-11-06 ENCOUNTER — Inpatient Hospital Stay (HOSPITAL_COMMUNITY)
Admission: RE | Admit: 2019-11-06 | Discharge: 2019-11-08 | DRG: 784 | Disposition: A | Discharge status: Home / Self Care | Payer: Medicare Other | Attending: Obstetrics and Gynecology | Admitting: Obstetrics and Gynecology

## 2019-11-06 ENCOUNTER — Encounter (HOSPITAL_COMMUNITY)
Admission: RE | Disposition: A | Discharge status: Home / Self Care | Payer: Self-pay | Source: Home / Self Care | Attending: Obstetrics and Gynecology

## 2019-11-06 DIAGNOSIS — D62 Acute posthemorrhagic anemia: Secondary | ICD-10-CM | POA: Diagnosis not present

## 2019-11-06 DIAGNOSIS — Z87891 Personal history of nicotine dependence: Secondary | ICD-10-CM | POA: Diagnosis not present

## 2019-11-06 DIAGNOSIS — O9081 Anemia of the puerperium: Secondary | ICD-10-CM | POA: Diagnosis not present

## 2019-11-06 DIAGNOSIS — O9902 Anemia complicating childbirth: Secondary | ICD-10-CM | POA: Diagnosis present

## 2019-11-06 DIAGNOSIS — Z3A39 39 weeks gestation of pregnancy: Secondary | ICD-10-CM | POA: Diagnosis not present

## 2019-11-06 DIAGNOSIS — Z20822 Contact with and (suspected) exposure to covid-19: Secondary | ICD-10-CM | POA: Diagnosis present

## 2019-11-06 DIAGNOSIS — O34211 Maternal care for low transverse scar from previous cesarean delivery: Principal | ICD-10-CM | POA: Diagnosis present

## 2019-11-06 DIAGNOSIS — O24420 Gestational diabetes mellitus in childbirth, diet controlled: Secondary | ICD-10-CM | POA: Diagnosis present

## 2019-11-06 DIAGNOSIS — N96 Recurrent pregnancy loss: Secondary | ICD-10-CM | POA: Diagnosis not present

## 2019-11-06 DIAGNOSIS — O24410 Gestational diabetes mellitus in pregnancy, diet controlled: Secondary | ICD-10-CM

## 2019-11-06 DIAGNOSIS — O99214 Obesity complicating childbirth: Secondary | ICD-10-CM | POA: Diagnosis present

## 2019-11-06 DIAGNOSIS — Z302 Encounter for sterilization: Secondary | ICD-10-CM | POA: Diagnosis not present

## 2019-11-06 DIAGNOSIS — Z8616 Personal history of COVID-19: Secondary | ICD-10-CM | POA: Diagnosis not present

## 2019-11-06 DIAGNOSIS — O9952 Diseases of the respiratory system complicating childbirth: Secondary | ICD-10-CM | POA: Diagnosis present

## 2019-11-06 DIAGNOSIS — Z98891 History of uterine scar from previous surgery: Secondary | ICD-10-CM

## 2019-11-06 DIAGNOSIS — O99824 Streptococcus B carrier state complicating childbirth: Secondary | ICD-10-CM | POA: Diagnosis present

## 2019-11-06 DIAGNOSIS — J45909 Unspecified asthma, uncomplicated: Secondary | ICD-10-CM | POA: Diagnosis present

## 2019-11-06 DIAGNOSIS — O09529 Supervision of elderly multigravida, unspecified trimester: Secondary | ICD-10-CM

## 2019-11-06 DIAGNOSIS — Z8701 Personal history of pneumonia (recurrent): Secondary | ICD-10-CM | POA: Diagnosis not present

## 2019-11-06 LAB — GLUCOSE, CAPILLARY
Glucose-Capillary: 132 mg/dL — ABNORMAL HIGH (ref 70–99)
Glucose-Capillary: 81 mg/dL (ref 70–99)
Glucose-Capillary: 86 mg/dL (ref 70–99)

## 2019-11-06 LAB — PREPARE RBC (CROSSMATCH)

## 2019-11-06 SURGERY — Surgical Case
Anesthesia: Spinal

## 2019-11-06 MED ORDER — KETOROLAC TROMETHAMINE 30 MG/ML IJ SOLN
30.0000 mg | Freq: Four times a day (QID) | INTRAMUSCULAR | Status: AC | PRN
  Administered 2019-11-07: 30 mg via INTRAMUSCULAR
  Filled 2019-11-06: qty 1

## 2019-11-06 MED ORDER — ONDANSETRON HCL 4 MG/2ML IJ SOLN
INTRAMUSCULAR | Status: AC
  Filled 2019-11-06: qty 2

## 2019-11-06 MED ORDER — OXYTOCIN-SODIUM CHLORIDE 30-0.9 UT/500ML-% IV SOLN
INTRAVENOUS | Status: DC | PRN
  Administered 2019-11-06: 300 mL via INTRAVENOUS

## 2019-11-06 MED ORDER — DEXTROSE 5 % IV SOLN
INTRAVENOUS | Status: AC
  Filled 2019-11-06: qty 3000

## 2019-11-06 MED ORDER — NALOXONE HCL 4 MG/10ML IJ SOLN
1.0000 ug/kg/h | INTRAVENOUS | Status: DC | PRN
  Filled 2019-11-06: qty 5

## 2019-11-06 MED ORDER — OXYCODONE HCL 5 MG/5ML PO SOLN: 5.0000 mg | Freq: Once | ORAL | Status: DC | PRN

## 2019-11-06 MED ORDER — PHENYLEPHRINE HCL-NACL 20-0.9 MG/250ML-% IV SOLN
INTRAVENOUS | Status: DC | PRN
  Administered 2019-11-06: 60 ug/min via INTRAVENOUS

## 2019-11-06 MED ORDER — SENNOSIDES-DOCUSATE SODIUM 8.6-50 MG PO TABS
2.0000 | ORAL_TABLET | ORAL | Status: DC
  Administered 2019-11-06 – 2019-11-07 (×2): 2 via ORAL
  Filled 2019-11-06 (×2): qty 2

## 2019-11-06 MED ORDER — NALBUPHINE HCL 10 MG/ML IJ SOLN: 5.0000 mg | INTRAMUSCULAR | Status: DC | PRN

## 2019-11-06 MED ORDER — ONDANSETRON HCL 4 MG/2ML IJ SOLN: 4.0000 mg | Freq: Once | INTRAMUSCULAR | Status: DC | PRN

## 2019-11-06 MED ORDER — PRENATAL MULTIVITAMIN CH
1.0000 | ORAL_TABLET | Freq: Every day | ORAL | Status: DC
  Administered 2019-11-07 – 2019-11-08 (×2): 1 via ORAL
  Filled 2019-11-06 (×2): qty 1

## 2019-11-06 MED ORDER — KETOROLAC TROMETHAMINE 30 MG/ML IJ SOLN
INTRAMUSCULAR | Status: AC
  Filled 2019-11-06: qty 1

## 2019-11-06 MED ORDER — NALBUPHINE HCL 10 MG/ML IJ SOLN: 5.0000 mg | Freq: Once | INTRAMUSCULAR | Status: DC | PRN

## 2019-11-06 MED ORDER — DIPHENHYDRAMINE HCL 25 MG PO CAPS: 25.0000 mg | ORAL_CAPSULE | ORAL | Status: DC | PRN

## 2019-11-06 MED ORDER — DIPHENHYDRAMINE HCL 50 MG/ML IJ SOLN: 12.5000 mg | INTRAMUSCULAR | Status: DC | PRN

## 2019-11-06 MED ORDER — MORPHINE SULFATE (PF) 0.5 MG/ML IJ SOLN
INTRAMUSCULAR | Status: DC | PRN
  Administered 2019-11-06: .15 mg via INTRATHECAL

## 2019-11-06 MED ORDER — SCOPOLAMINE 1 MG/3DAYS TD PT72
1.0000 | MEDICATED_PATCH | Freq: Once | TRANSDERMAL | Status: DC
  Administered 2019-11-06: 1.5 mg via TRANSDERMAL

## 2019-11-06 MED ORDER — PHENYLEPHRINE HCL-NACL 20-0.9 MG/250ML-% IV SOLN
INTRAVENOUS | Status: AC
  Filled 2019-11-06: qty 250

## 2019-11-06 MED ORDER — HYDROMORPHONE HCL 1 MG/ML IJ SOLN
INTRAMUSCULAR | Status: AC
  Filled 2019-11-06: qty 0.5

## 2019-11-06 MED ORDER — FENTANYL CITRATE (PF) 100 MCG/2ML IJ SOLN
INTRAMUSCULAR | Status: DC | PRN
  Administered 2019-11-06: 15 ug via INTRATHECAL

## 2019-11-06 MED ORDER — MENTHOL 3 MG MT LOZG: 1.0000 | LOZENGE | OROMUCOSAL | Status: DC | PRN

## 2019-11-06 MED ORDER — OXYTOCIN-SODIUM CHLORIDE 30-0.9 UT/500ML-% IV SOLN: 2.5000 [IU]/h | INTRAVENOUS | Status: AC

## 2019-11-06 MED ORDER — FENTANYL CITRATE (PF) 100 MCG/2ML IJ SOLN
INTRAMUSCULAR | Status: AC
  Filled 2019-11-06: qty 2

## 2019-11-06 MED ORDER — DEXAMETHASONE SODIUM PHOSPHATE 10 MG/ML IJ SOLN
INTRAMUSCULAR | Status: DC | PRN
  Administered 2019-11-06: 10 mg via INTRAVENOUS

## 2019-11-06 MED ORDER — DEXTROSE 5 % IV SOLN
3.0000 g | INTRAVENOUS | Status: AC
  Administered 2019-11-06: 3 g via INTRAVENOUS

## 2019-11-06 MED ORDER — ONDANSETRON HCL 4 MG/2ML IJ SOLN
4.0000 mg | Freq: Three times a day (TID) | INTRAMUSCULAR | Status: DC | PRN
  Administered 2019-11-06: 4 mg via INTRAVENOUS
  Filled 2019-11-06: qty 2

## 2019-11-06 MED ORDER — WITCH HAZEL-GLYCERIN EX PADS: 1.0000 "application " | MEDICATED_PAD | CUTANEOUS | Status: DC | PRN

## 2019-11-06 MED ORDER — LACTATED RINGERS IV SOLN: INTRAVENOUS | Status: DC

## 2019-11-06 MED ORDER — SIMETHICONE 80 MG PO CHEW
80.0000 mg | CHEWABLE_TABLET | Freq: Three times a day (TID) | ORAL | Status: DC
  Administered 2019-11-06 – 2019-11-08 (×6): 80 mg via ORAL
  Filled 2019-11-06 (×6): qty 1

## 2019-11-06 MED ORDER — DEXAMETHASONE SODIUM PHOSPHATE 10 MG/ML IJ SOLN
INTRAMUSCULAR | Status: AC
  Filled 2019-11-06: qty 1

## 2019-11-06 MED ORDER — MEPERIDINE HCL 25 MG/ML IJ SOLN: 6.2500 mg | INTRAMUSCULAR | Status: DC | PRN

## 2019-11-06 MED ORDER — DIPHENHYDRAMINE HCL 25 MG PO CAPS
25.0000 mg | ORAL_CAPSULE | Freq: Four times a day (QID) | ORAL | Status: DC | PRN
  Administered 2019-11-06: 25 mg via ORAL
  Filled 2019-11-06: qty 1

## 2019-11-06 MED ORDER — OXYCODONE HCL 5 MG PO TABS: 5.0000 mg | ORAL_TABLET | Freq: Once | ORAL | Status: DC | PRN

## 2019-11-06 MED ORDER — TETANUS-DIPHTH-ACELL PERTUSSIS 5-2.5-18.5 LF-MCG/0.5 IM SUSP: 0.5000 mL | Freq: Once | INTRAMUSCULAR | Status: DC

## 2019-11-06 MED ORDER — NALOXONE HCL 0.4 MG/ML IJ SOLN: 0.4000 mg | INTRAMUSCULAR | Status: DC | PRN

## 2019-11-06 MED ORDER — SCOPOLAMINE 1 MG/3DAYS TD PT72
MEDICATED_PATCH | TRANSDERMAL | Status: AC
  Filled 2019-11-06: qty 1

## 2019-11-06 MED ORDER — SODIUM CHLORIDE 0.9% FLUSH: 3.0000 mL | INTRAVENOUS | Status: DC | PRN

## 2019-11-06 MED ORDER — ZOLPIDEM TARTRATE 5 MG PO TABS: 5.0000 mg | ORAL_TABLET | Freq: Every evening | ORAL | Status: DC | PRN

## 2019-11-06 MED ORDER — HYDROMORPHONE HCL 1 MG/ML IJ SOLN
0.2500 mg | INTRAMUSCULAR | Status: DC | PRN
  Administered 2019-11-06: 0.5 mg via INTRAVENOUS

## 2019-11-06 MED ORDER — SIMETHICONE 80 MG PO CHEW: 80.0000 mg | CHEWABLE_TABLET | ORAL | Status: DC | PRN

## 2019-11-06 MED ORDER — BUPIVACAINE IN DEXTROSE 0.75-8.25 % IT SOLN
INTRATHECAL | Status: DC | PRN
  Administered 2019-11-06: 2 mL via INTRATHECAL

## 2019-11-06 MED ORDER — ONDANSETRON HCL 4 MG/2ML IJ SOLN
INTRAMUSCULAR | Status: DC | PRN
  Administered 2019-11-06: 4 mg via INTRAVENOUS

## 2019-11-06 MED ORDER — KETOROLAC TROMETHAMINE 30 MG/ML IJ SOLN
30.0000 mg | Freq: Four times a day (QID) | INTRAMUSCULAR | Status: AC | PRN
  Administered 2019-11-06 (×2): 30 mg via INTRAVENOUS
  Filled 2019-11-06: qty 1

## 2019-11-06 MED ORDER — SIMETHICONE 80 MG PO CHEW
80.0000 mg | CHEWABLE_TABLET | ORAL | Status: DC
  Administered 2019-11-06 – 2019-11-07 (×2): 80 mg via ORAL
  Filled 2019-11-06 (×2): qty 1

## 2019-11-06 MED ORDER — COCONUT OIL OIL: 1.0000 "application " | TOPICAL_OIL | Status: DC | PRN

## 2019-11-06 MED ORDER — POVIDONE-IODINE 10 % EX SWAB
2.0000 "application " | Freq: Once | CUTANEOUS | Status: AC
  Administered 2019-11-06: 2 via TOPICAL

## 2019-11-06 MED ORDER — MORPHINE SULFATE (PF) 0.5 MG/ML IJ SOLN
INTRAMUSCULAR | Status: AC
  Filled 2019-11-06: qty 10

## 2019-11-06 MED ORDER — DIBUCAINE (PERIANAL) 1 % EX OINT: 1.0000 "application " | TOPICAL_OINTMENT | CUTANEOUS | Status: DC | PRN

## 2019-11-06 MED ORDER — OXYTOCIN-SODIUM CHLORIDE 30-0.9 UT/500ML-% IV SOLN
INTRAVENOUS | Status: AC
  Filled 2019-11-06: qty 500

## 2019-11-06 SURGICAL SUPPLY — 40 items
APL SKNCLS STERI-STRIP NONHPOA (GAUZE/BANDAGES/DRESSINGS) ×1
BENZOIN TINCTURE PRP APPL 2/3 (GAUZE/BANDAGES/DRESSINGS) ×3 IMPLANT
CLAMP CORD UMBIL (MISCELLANEOUS) IMPLANT
CLOSURE WOUND 1/2 X4 (GAUZE/BANDAGES/DRESSINGS) ×1
CLOTH BEACON ORANGE TIMEOUT ST (SAFETY) ×3 IMPLANT
DRAIN JACKSON PRT FLT 10 (DRAIN) ×2 IMPLANT
DRSG OPSITE POSTOP 4X10 (GAUZE/BANDAGES/DRESSINGS) ×3 IMPLANT
ELECT REM PT RETURN 9FT ADLT (ELECTROSURGICAL) ×3
ELECTRODE REM PT RTRN 9FT ADLT (ELECTROSURGICAL) ×1 IMPLANT
EVACUATOR SILICONE 100CC (DRAIN) IMPLANT
EXTRACTOR VACUUM M CUP 4 TUBE (SUCTIONS) IMPLANT
EXTRACTOR VACUUM M CUP 4' TUBE (SUCTIONS)
GLOVE BIO SURGEON STRL SZ 6.5 (GLOVE) ×2 IMPLANT
GLOVE BIO SURGEONS STRL SZ 6.5 (GLOVE) ×1
GLOVE BIOGEL PI IND STRL 7.0 (GLOVE) ×2 IMPLANT
GLOVE BIOGEL PI INDICATOR 7.0 (GLOVE) ×4
GOWN STRL REUS W/TWL LRG LVL3 (GOWN DISPOSABLE) ×6 IMPLANT
KIT ABG SYR 3ML LUER SLIP (SYRINGE) IMPLANT
NDL HYPO 25X5/8 SAFETYGLIDE (NEEDLE) IMPLANT
NEEDLE HYPO 25X5/8 SAFETYGLIDE (NEEDLE) IMPLANT
NS IRRIG 1000ML POUR BTL (IV SOLUTION) ×3 IMPLANT
PACK C SECTION WH (CUSTOM PROCEDURE TRAY) ×3 IMPLANT
PAD OB MATERNITY 4.3X12.25 (PERSONAL CARE ITEMS) ×3 IMPLANT
PENCIL SMOKE EVAC W/HOLSTER (ELECTROSURGICAL) ×3 IMPLANT
RTRCTR C-SECT PINK 25CM LRG (MISCELLANEOUS) IMPLANT
STRIP CLOSURE SKIN 1/2X4 (GAUZE/BANDAGES/DRESSINGS) ×2 IMPLANT
SUT CHROMIC 0 CT 1 (SUTURE) ×3 IMPLANT
SUT MNCRL AB 3-0 PS2 27 (SUTURE) ×3 IMPLANT
SUT PLAIN 0 NONE (SUTURE) ×2 IMPLANT
SUT PLAIN 2 0 (SUTURE) ×6
SUT PLAIN 2 0 XLH (SUTURE) ×3 IMPLANT
SUT PLAIN ABS 2-0 CT1 27XMFL (SUTURE) ×2 IMPLANT
SUT SILK 2 0 SH (SUTURE) ×2 IMPLANT
SUT VIC AB 0 CTX 36 (SUTURE) ×15
SUT VIC AB 0 CTX36XBRD ANBCTRL (SUTURE) ×4 IMPLANT
SUT VIC AB 2-0 SH 27 (SUTURE)
SUT VIC AB 2-0 SH 27XBRD (SUTURE) IMPLANT
TOWEL OR 17X24 6PK STRL BLUE (TOWEL DISPOSABLE) ×3 IMPLANT
TRAY FOLEY W/BAG SLVR 14FR LF (SET/KITS/TRAYS/PACK) ×3 IMPLANT
WATER STERILE IRR 1000ML POUR (IV SOLUTION) ×3 IMPLANT

## 2019-11-06 NOTE — Progress Notes (Signed)
Called CNM about PIT running in quickly but no new orders received for more Pitocin. Pt stable with LR running at 125/hr.

## 2019-11-06 NOTE — Op Note (Signed)
Cesarean Section Procedure Note   [name]  11/06/2019  Indications: Scheduled Proceedure/Maternal Request and desires sterilization   Pre-operative Diagnosis: repeat x 4.   Post-operative Diagnosis: Same   Surgeon Bobbe Quilter  Procedure Repeat LTCS , LOA and BTL.  Pt did not want a salpingectomy.   Assistants: Osborn Coho MD and Nigel Bridgeman CNM  Anesthesia: spinal   Procedure Details:  The patient was seen in the Holding Room. The risks, benefits, complications, treatment options, and expected outcomes were discussed with the patient. The patient concurred with the proposed plan, giving informed consent. identified as [name] and the procedure verified as C-Section Delivery. A Time Out was held and the above information confirmed.  After induction of anesthesia, the patient was draped and prepped in the usual sterile manner. A transverse incision was made and carried down through the subcutaneous tissue to the fascia. Fascial incision was made and extended transversely. The fascia was separated from the underlying rectus tissue superiorly and inferiorly. The peritoneum was identified and entered. Peritoneal incision was extended longitudinally. Bladder uterine adhesions were lysed with scissors.   The utero-vesical peritoneal reflection was incised transversely and the bladder flap was bluntly freed from the lower uterine segment. A low transverse uterine incision was made. Delivered from cephalic presentation was a  pound Living newborn infant(s) or Female with Apgar scores of 9 at one minute and 10 at five minutes. Cord ph was not sent the umbilical cord was clamped and cut cord blood was obtained for evaluation. The placenta was removed Intact and appeared normal. The uterine outline, tubes and ovaries appeared normal}. The uterine incision was closed with running locked sutures of 0Vicryl. A second layer of 0 vicryl was used to imbricate the uterus.  The patients left  fallopian tube was grasped at the mid ishtmic portion with babcock clamp, ligated with 2-0 plain and excised.  The patients right fallopian tube was followed out to the fimbriated end.  The mid isthmic portion of the tube was ligated with 2-0 plain and excised.  Both portions of tubes was sent to pathology.     Hemostasis was observed. Lavage was carried out until clear. The fascia was then reapproximated with running sutures of 0coated Vicryl. A jp drain was placed in the SQ space after the tissue was made hemostatic with the bovie The subcuticular closure was performed using 2-0plain gut. The skin was closed with 3-67monocryl.   Instrument, sponge, and needle counts were correct prior the abdominal closure and were correct at the conclusion of the case.    Findings: female vtx pres.  Normal appearing    Estimated Blood Loss: 175cc   Total IV Fluids:   Urine Output: 150CC OF clear urine  Specimens:  Placenta to path Complications: no complications  Disposition: PACU - hemodynamically stable.   Maternal Condition: stable   Baby condition / location:  Couplet care / Skin to Skin  Attending Attestation: I was present for the entire procedure.   Signed: Surgeon(s): Jaymes Graff, MD Osborn Coho, MD

## 2019-11-06 NOTE — Transfer of Care (Signed)
Immediate Anesthesia Transfer of Care Note  Patient: [name]  Procedure(s) Performed: CESAREAN SECTION WITH BILATERAL TUBAL LIGATION (N/A ) APPLICATION OF CELL SAVER (N/A )  Patient Location: PACU  Anesthesia Type:Spinal  Level of Consciousness: awake, alert  and oriented  Airway & Oxygen Therapy: Patient Spontanous Breathing  Post-op Assessment: Report given to RN and Post -op Vital signs reviewed and stable  Post vital signs: Reviewed and stable  Last Vitals:  Vitals Value Taken Time  BP    Temp    Pulse    Resp    SpO2      Last Pain:  Vitals:   11/06/19 1055  TempSrc: Oral         Complications: No complications documented.

## 2019-11-06 NOTE — Progress Notes (Signed)
Epidural catheter removed per MD order, tip intact - [email or web address]

## 2019-11-06 NOTE — Interval H&P Note (Signed)
History and Physical Interval Note:  11/06/2019 12:10 PM  [name]  has presented today for surgery, with the diagnosis of repeat x 4.  The various methods of treatment have been discussed with the patient and family. After consideration of risks, benefits and other options for treatment, the patient has consented to  Procedure(s): CESAREAN SECTION WITH BILATERAL TUBAL LIGATION (N/A) APPLICATION OF CELL SAVER (N/A) as a surgical intervention.  The patient's history has been reviewed, patient examined, no change in status, stable for surgery.  I have reviewed the patient's chart and labs.  Questions were answered to the patient's satisfaction.     Arnola Crittendon A Amor Hyle

## 2019-11-06 NOTE — Anesthesia Preprocedure Evaluation (Addendum)
Anesthesia Evaluation  Patient identified by MRN, date of birth, ID band Patient awake    Reviewed: Allergy & Precautions, NPO status , Patient's Chart, lab work & pertinent test results  History of Anesthesia Complications Negative for: history of anesthetic complications  Airway Mallampati: II  TM Distance: >3 FB Neck ROM: Full    Dental   Pulmonary asthma , former smoker,    Pulmonary exam normal        Cardiovascular negative cardio ROS Normal cardiovascular exam     Neuro/Psych negative neurological ROS  negative psych ROS   GI/Hepatic Neg liver ROS, PUD, GERD  ,  Endo/Other  diabetes, GestationalMorbid obesity  Renal/GU negative Renal ROS  negative genitourinary   Musculoskeletal negative musculoskeletal ROS (+)   Abdominal   Peds  Hematology  (+) anemia ,   Anesthesia Other Findings  Prior C/S x3 Hgb 10.2  Reproductive/Obstetrics (+) Pregnancy                           Anesthesia Physical Anesthesia Plan  ASA: III  Anesthesia Plan: Spinal   Post-op Pain Management:    Induction:   PONV Risk Score and Plan: 3 and Ondansetron and Treatment may vary due to age or medical condition  Airway Management Planned: Natural Airway  Additional Equipment: None  Intra-op Plan:   Post-operative Plan:   Informed Consent: I have reviewed the patients History and Physical, chart, labs and discussed the procedure including the risks, benefits and alternatives for the proposed anesthesia with the patient or authorized representative who has indicated his/her understanding and acceptance.       Plan Discussed with:   Anesthesia Plan Comments:        Anesthesia Quick Evaluation

## 2019-11-06 NOTE — Anesthesia Procedure Notes (Signed)
Spinal  Patient location during procedure: OR Staffing Performed: anesthesiologist  Anesthesiologist: Lucretia Kern, MD Preanesthetic Checklist Completed: patient identified, IV checked, risks and benefits discussed, surgical consent, monitors and equipment checked, pre-op evaluation and timeout performed Spinal Block Patient position: sitting Prep: DuraPrep Patient monitoring: continuous pulse ox, blood pressure and heart rate Approach: midline Location: L3-4 Injection technique: catheter Needle Needle type: Tuohy and Sprotte  Needle gauge: 24 G Needle length: 12.7 cm Catheter type: closed end flexible Catheter size: 19 g Additional Notes The patient was prepped and draped in the usual sterile fashion. A combined spinal-epidural technique was performed with a 9 cm 17g Tuohy needle and loss of resistance technique. After encountering LOR, a 12.7 cm 24g spinal needle was introduced via the Tuohy and clear CSF was aspirated prior to injection of local anesthetic. The spinal needle was removed and a 19 g flexible epidural catheter placed prior to Tuohy removal. Patient tolerated the procedure well without complications.

## 2019-11-06 NOTE — Anesthesia Postprocedure Evaluation (Signed)
Anesthesia Post Note  Patient: [name]  Procedure(s) Performed: CESAREAN SECTION WITH BILATERAL TUBAL LIGATION (N/A ) APPLICATION OF CELL SAVER (N/A )     Patient location during evaluation: PACU Anesthesia Type: Spinal Level of consciousness: oriented and awake and alert Pain management: pain level controlled Vital Signs Assessment: post-procedure vital signs reviewed and stable Respiratory status: spontaneous breathing, respiratory function stable and nonlabored ventilation Cardiovascular status: blood pressure returned to baseline and stable Postop Assessment: no headache, no backache, no apparent nausea or vomiting and spinal receding Anesthetic complications: no   No complications documented.  Last Vitals:  Vitals:   11/06/19 1430 11/06/19 1445  BP: (!) 142/51 140/73  Pulse: (!) 58 (!) 55  Resp: 18 18  Temp:    SpO2: 100% 99%    Last Pain:  Vitals:   11/06/19 1445  TempSrc:   PainSc: 5    Pain Goal:    LLE Motor Response: Purposeful movement (11/06/19 1445) LLE Sensation: Tingling (11/06/19 1445) RLE Motor Response: Purposeful movement (11/06/19 1445) RLE Sensation: Tingling (11/06/19 1445)     Epidural/Spinal Function Cutaneous sensation: Tingles (11/06/19 1445), Patient able to flex knees: Yes (11/06/19 1445), Patient able to lift hips off bed: No (11/06/19 1445), Back pain beyond tenderness at insertion site: No (11/06/19 1445), Progressively worsening motor and/or sensory loss: No (11/06/19 1445), Bowel and/or bladder incontinence post epidural: No (11/06/19 1445)  Lucretia Kern

## 2019-11-07 ENCOUNTER — Encounter (HOSPITAL_COMMUNITY): Payer: Self-pay | Admitting: Obstetrics and Gynecology

## 2019-11-07 DIAGNOSIS — O9902 Anemia complicating childbirth: Secondary | ICD-10-CM | POA: Diagnosis present

## 2019-11-07 LAB — TYPE AND SCREEN
ABO/RH(D): O POS
Antibody Screen: NEGATIVE
Unit division: 0
Unit division: 0
Unit division: 0
Unit division: 0

## 2019-11-07 LAB — GLUCOSE, CAPILLARY
Glucose-Capillary: 101 mg/dL — ABNORMAL HIGH (ref 70–99)
Glucose-Capillary: 104 mg/dL — ABNORMAL HIGH (ref 70–99)
Glucose-Capillary: 139 mg/dL — ABNORMAL HIGH (ref 70–99)
Glucose-Capillary: 85 mg/dL (ref 70–99)

## 2019-11-07 LAB — BPAM RBC
Blood Product Expiration Date: 202111232359
Blood Product Expiration Date: 202111232359
Blood Product Expiration Date: 202111232359
Blood Product Expiration Date: 202111232359
Unit Type and Rh: 5100
Unit Type and Rh: 5100
Unit Type and Rh: 5100
Unit Type and Rh: 5100

## 2019-11-07 LAB — CBC
HCT: 29.6 % — ABNORMAL LOW (ref 36.0–46.0)
Hemoglobin: 9.5 g/dL — ABNORMAL LOW (ref 12.0–15.0)
MCH: 26.2 pg (ref 26.0–34.0)
MCHC: 32.1 g/dL (ref 30.0–36.0)
MCV: 81.8 fL (ref 80.0–100.0)
Platelets: 303 10*3/uL (ref 150–400)
RBC: 3.62 MIL/uL — ABNORMAL LOW (ref 3.87–5.11)
RDW: 16.8 % — ABNORMAL HIGH (ref 11.5–15.5)
WBC: 14.6 10*3/uL — ABNORMAL HIGH (ref 4.0–10.5)
nRBC: 0 % (ref 0.0–0.2)

## 2019-11-07 MED ORDER — IBUPROFEN 800 MG PO TABS
800.0000 mg | ORAL_TABLET | Freq: Three times a day (TID) | ORAL | Status: DC
  Administered 2019-11-07 – 2019-11-08 (×2): 800 mg via ORAL
  Filled 2019-11-07 (×3): qty 1

## 2019-11-07 MED ORDER — MAGNESIUM OXIDE 400 (241.3 MG) MG PO TABS
400.0000 mg | ORAL_TABLET | Freq: Every day | ORAL | Status: DC
  Administered 2019-11-07 – 2019-11-08 (×2): 400 mg via ORAL
  Filled 2019-11-07 (×2): qty 1

## 2019-11-07 MED ORDER — ACETAMINOPHEN 500 MG PO TABS
1000.0000 mg | ORAL_TABLET | Freq: Four times a day (QID) | ORAL | Status: DC
  Administered 2019-11-07 – 2019-11-08 (×3): 1000 mg via ORAL
  Filled 2019-11-07 (×4): qty 2

## 2019-11-07 MED ORDER — OXYCODONE HCL 5 MG PO TABS
5.0000 mg | ORAL_TABLET | ORAL | Status: DC | PRN
  Administered 2019-11-07 (×3): 5 mg via ORAL
  Filled 2019-11-07 (×3): qty 1

## 2019-11-07 MED ORDER — POLYSACCHARIDE IRON COMPLEX 150 MG PO CAPS
150.0000 mg | ORAL_CAPSULE | Freq: Every day | ORAL | Status: DC
  Administered 2019-11-07 – 2019-11-08 (×2): 150 mg via ORAL
  Filled 2019-11-07 (×2): qty 1

## 2019-11-07 NOTE — Progress Notes (Addendum)
Subjective: POD# 1 Live born female  Birth Weight: 6 lb 5.6 oz (2880 g) APGAR: 9, 10  Newborn Delivery   Birth date/time: 11/06/2019 13:04:00 Delivery type: C-Section, Low Transverse Trial of labor: No C-section categorization: Repeat     Baby name: [name] Delivering provider: Jaymes Graff   Feeding: breast  Pain control at delivery: Spinal;Epidural   Reports feeling well, some pain with movement. Desires DC home in am if eligible  Patient reports tolerating PO.   Breast symptoms:+ colostrum Pain controlled with PO meds Denies HA/SOB/C/P/N/V/dizziness. Flatus absent. She reports vaginal bleeding as normal, without clots.  She is ambulating, urinating without difficulty.     Objective:   VS:    Vitals:   11/07/19 0258 11/07/19 0300 11/07/19 0400 11/07/19 0600  BP:  (!) 142/75 123/61 118/71  Pulse:  (!) 57 60 62  Resp:  18 18 18   Temp: 98 F (36.7 C)  98 F (36.7 C) 98 F (36.7 C)  TempSrc: Oral  Oral Oral  SpO2: 100% 100% 100% 100%  Weight:      Height:          Intake/Output Summary (Last 24 hours) at 11/07/2019 1322 Last data filed at 11/07/2019 11/09/2019 Gross per 24 hour  Intake 3461.68 ml  Output 2755 ml  Net 706.68 ml        Recent Labs    11/07/19 0354  WBC 14.6*  HGB 9.5*  HCT 29.6*  PLT 303     Blood type: --/--/O POS (10/20 1024)  Rubella: Immune (03/10 0000)  Vaccines: TDaP          declined         Flu             declined                    COVID-19 declined   Physical Exam:  General: alert, cooperative and no distress CV: Regular rate and rhythm Resp: clear Abdomen: soft, nontender, normal bowel sounds Incision: dry, intact and serous drainage present 30% of dressing  - JPratt drain w/ serous fluid patent Uterine Fundus: firm, below umbilicus, nontender Lochia: minimal Ext: no edema, redness or tenderness in the calves or thighs      Assessment/Plan: 39 y.o.   POD# 1. 20                  Principal Problem:    Postpartum care following cesarean delivery 10/22 Active Problems:   Morbid obesity with BMI of 40.0-44.9, adult ([facility])   Asthma   AMA (advanced maternal age) multigravida 35+   Encounter for maternal care for low transverse scar from repeat cesarean delivery   S/P cesarean section   Maternal anemia, with delivery - IDA w/ superimposed ABL anemia  - asymptomatic, will start Po iron and mag ox  GDM A1  - stable CBG's, f/u postpartum  Doing well, stable.               Advance diet as tolerated Encourage rest when baby rests Breastfeeding support Encourage to ambulate Routine post-op care Consider DC home tomorrow if eligible  10-28-1980, CNM, MSN 11/07/2019, 1:22 PM

## 2019-11-07 NOTE — Lactation Note (Signed)
This note was copied from a baby's chart. Lactation Consultation Note  Patient Name: [name] [id number] Date: 11/07/2019 Reason for consult: Initial assessment;Early term 37-38.6wks P1, 11 hour ETI female infant.  Per mom, infant not latching well been very spitty, emesis 4 or 5 times today. LC observed mom has large breast but average nipple size and LC suggested mom use the football hold position due large breast and having C/S delivery. Mom was open to trying the football position instead of the cradle hold. Tools given: hand pump to help evert nipple shaft out more prior to latching infant mom is slightly short shafted and flat but breast tissue responds well to breast stimulation.  Per mom, she receives [facility] in [facility] and she has DEBP at home. Mom would like to receive a hand pump, LC explained how to use, mom demonstrated how to use and was easily expressing few drops of colostrum. LC explained not uncommon for infant to be spitty with C/S delivery, advised mom to continue latch infant at breast and do STS as much as possible. Parents know how to suction infant.  LC entered the room, infant was cuing to BF. LC discussed hand expression and mom expressed 6 mls of colostrum in bullet, infant was given 3 mls by spoon.  Mom latched infant on her right breast using the football hold position, infant latched with depth, BF for approximately  5 minutes and then became spitty, infant was burped and afterwards  became sleepy.  Per mom,she felt comfortable with this position and infant appeared to latch well.  Dad swaddled infant in blanket. Mom knows to BF infant according to hunger cues, 8 to 12+ times within 24 hours, STS. Mom knows to ask RN or LC for assistance with latching infant at the breast if needed.  Mom made aware of O/P services, breastfeeding support groups, community resources, and our phone # for post-discharge questions.  Maternal Data Formula Feeding for  Exclusion: No Has patient been taught Hand Expression?: Yes Does the patient have breastfeeding experience prior to this delivery?: Yes  Feeding Feeding Type: Breast Fed  LATCH Score Latch: Grasps breast easily, tongue down, lips flanged, rhythmical sucking.  Audible Swallowing: Spontaneous and intermittent  Type of Nipple: Flat  Comfort (Breast/Nipple): Soft / non-tender  Hold (Positioning): Assistance needed to correctly position infant at breast and maintain latch.  LATCH Score: 8  Interventions Interventions: Expressed milk;Position options;Breast massage;Skin to skin;Assisted with latch;Adjust position;Support pillows;Hand pump;Breast compression;Breast feeding basics reviewed;Hand express;Pre-pump if needed  Lactation Tools Discussed/Used Tools: Pump Breast pump type: Manual WIC Program: Yes Pump Review: Setup, frequency, and cleaning;Milk Storage Initiated by:: [name], IBCLC Date initiated:: 11/07/19   Consult Status Consult Status: Follow-up Date: 11/07/19 Follow-up type: In-patient    [name] 11/07/2019, 12:22 AM

## 2019-11-07 NOTE — Progress Notes (Signed)
MOB called RN after eating breakfast at 1044. RN will check 2hr pp at noon.

## 2019-11-07 NOTE — Lactation Note (Addendum)
This note was copied from a baby's chart. Lactation Consultation Note  Patient Name: [name] [id number] Date: 11/07/2019    Infant is 38 weeks 31 hours old with a 4% weight loss. [name] states nursing has been a challenge since infant falls asleep at the breast after 5 minutes of nursing. [name] states she used the manual pump X 1 and noted some drops of colostrum. [name] is feeding based on cues but is concerned infant is not transferring milk. [name] states infant is STS during feedings.  At the time of the evaluation, infant was in an outfit. LC removed outfit and put infant STS with [name].   [name]'s nipples are flat and breasts are large. LC applied heat and pre-pumped using the DEBP. Drops of colostrum collected in the flange. Flange size adjusted from 27 to 30. [name] is in a lot of pain in the uterine area with pumping and after latching the baby. LC informed the RN who went in to access.   LC not able to latch at the breast directly for more than 5 minutes even with a teacup hold in football. LC applied the NS size 20 and infant latched for 10 minutes with non nutritive sucking that improved over time. Parents still nursing at the end of the visit.   Plan 1. [name] to nurse based on cues 8-12 x in 24 hour period no more than 3 hours without an attempt.           2. [name] to latch in football hold less pressure to her abdomen first at the breast and then with the nipple shield.          3. Parents shown how to look for signs of milk transfer and methods to keep infant active at the breast.           4. LC reviewed I' and O' s sheet with [name] to get a better understanding of her feeds urine and fecal output.            5. Parents aware use of NS is barrier to milk let down. [name] to pump using DEBP for q 3 hours for 15 minutes.             6. Pump assembly, storage and cleaning reviewed with Dad.   Parents stated they did not have any questions at the end of the visit.   [name] expressed wanting to feed only  using her breast milk when we talked about supplementation.

## 2019-11-07 NOTE — Progress Notes (Signed)
Called Arlan Organ @ 2037 due to patient stating that she needed pain medication .Telephone  orders were given for ibuprofen 800 mg every 8 hours scheduled and for tylenol 1 gram every 6 hours scheduled.Verbal ordered were read back to the provider.

## 2019-11-07 NOTE — Progress Notes (Signed)
Pt ambulated in the halls and is passing gas with no complaints.

## 2019-11-08 ENCOUNTER — Ambulatory Visit: Payer: Self-pay

## 2019-11-08 LAB — GLUCOSE, CAPILLARY: Glucose-Capillary: 84 mg/dL (ref 70–99)

## 2019-11-08 MED ORDER — OXYCODONE HCL 5 MG PO TABS: 5.0000 mg | ORAL_TABLET | Freq: Four times a day (QID) | ORAL | 0 refills | Status: AC | PRN

## 2019-11-08 MED ORDER — ACETAMINOPHEN 500 MG PO TABS
1000.0000 mg | ORAL_TABLET | Freq: Three times a day (TID) | ORAL | 0 refills | Status: DC | PRN
Start: 2019-11-08 — End: 2020-09-02

## 2019-11-08 MED ORDER — IBUPROFEN 800 MG PO TABS
800.0000 mg | ORAL_TABLET | Freq: Three times a day (TID) | ORAL | 0 refills | Status: DC
Start: 2019-11-08 — End: 2020-07-14

## 2019-11-08 NOTE — Progress Notes (Signed)
AVS printed and discharged instructions given to pt. Patient instructed to call for follow-up appointment and pick up prescriptions. Printed prescription given to pt. All questions answered and pt verbalized understanding.

## 2019-11-08 NOTE — Lactation Note (Signed)
This note was copied from a baby's chart. Lactation Consultation Note  Patient Name: [name] [id number] Date: 11/08/2019   Parents being d/c today.  Parents report this is not their first baby and they have no questions/concerns. Mom has breastfeeding resource list for home use.  Urged mom to call lactation as needed.   Maternal Data    Feeding Feeding Type: Breast Milk with Formula added  LATCH Score                   Interventions    Lactation Tools Discussed/Used     Consult Status      [name] 11/08/2019, 3:27 PM

## 2019-11-08 NOTE — Discharge Summary (Signed)
Postpartum Discharge Summary  Date of Service updated 11/08/19    Patient Name: [name] DOB: 08-23-1980 MRN: [id number]  Date of admission: 11/06/2019 Delivery date:11/06/2019  Delivering provider: Crawford Givens  Date of discharge: 11/08/2019  Admitting diagnosis: Encounter for maternal care for low transverse scar from repeat cesarean delivery [O34.211] S/P cesarean section [Z98.891] Intrauterine pregnancy: [redacted]w[redacted]d     Secondary diagnosis:  Principal Problem:   Postpartum care following cesarean delivery 10/22 Active Problems:   Morbid obesity with BMI of 40.0-44.9, adult ([facility])   Asthma   AMA (advanced maternal age) multigravida 38+   Encounter for maternal care for low transverse scar from repeat cesarean delivery   S/P cesarean section   Maternal anemia, with delivery - IDA w/ superimposed ABL anemia  Additional problems: none    Discharge diagnosis: Term Pregnancy Delivered                                              Post partum procedures:postpartum tubal ligation Augmentation: N/A Complications: None  Hospital course: Scheduled C/S   39 y.o. yo [id number] at [redacted]w[redacted]d was admitted to the hospital 11/06/2019 for scheduled cesarean section with the following indication:Elective Repeat.Delivery details are as follows:  Membrane Rupture Time/Date:  ,   Delivery Method:C-Section, Low Transverse  Details of operation can be found in separate operative note.  Patient had an uncomplicated postpartum course.  She is ambulating, tolerating a regular diet, passing flatus, and urinating well. Patient is discharged home in stable condition on  11/08/19        Newborn Data: Birth date:11/06/2019  Birth time:1:04 PM  Gender:Female  Living status:Living  Apgars:9 ,10  Weight:2880 g     Magnesium Sulfate received: No BMZ received: No Rhophylac:N/A MMR:N/A T-DaP:declined Flu: declined Transfusion:No  Physical exam  Vitals:   11/07/19 0600 11/07/19 1500 11/07/19 1912  11/08/19 0513  BP: 118/71 123/65 128/71 130/76  Pulse: 62 67 65 64  Resp: $Remo'18 18 16 18  'qMofa$ Temp: 98 F (36.7 C) 98.9 F (37.2 C) 98 F (36.7 C) 97.9 F (36.6 C)  TempSrc: Oral Oral Oral Oral  SpO2: 100%  96% 100%  Weight:      Height:       General: alert, cooperative and no distress Lochia: appropriate Uterine Fundus: firm Incision: drain removed w/o difficulty and less than 5 cc in collection pouch, new honeycomb applied in sterile fasion DVT Evaluation: No evidence of DVT seen on physical exam. No cords or calf tenderness. No significant calf/ankle edema. Labs: Lab Results  Component Value Date   WBC 14.6 (H) 11/07/2019   HGB 9.5 (L) 11/07/2019   HCT 29.6 (L) 11/07/2019   MCV 81.8 11/07/2019   PLT 303 11/07/2019   CMP Latest Ref Rng & Units 11/04/2019  Glucose 70 - 99 mg/dL 114(H)  BUN 6 - 20 mg/dL 8  Creatinine 0.44 - 1.00 mg/dL 0.60  Sodium 135 - 145 mmol/L 133(L)  Potassium 3.5 - 5.1 mmol/L 3.7  Chloride 98 - 111 mmol/L 106  CO2 22 - 32 mmol/L 17(L)  Calcium 8.9 - 10.3 mg/dL 9.2  Total Protein 6.0 - 8.5 g/dL -  Total Bilirubin 0.0 - 1.2 mg/dL -  Alkaline Phos 39 - 117 IU/L -  AST 0 - 40 IU/L -  ALT 0 - 32 IU/L -   Edinburgh Score: No flowsheet data found.  After visit meds:  Allergies as of 11/08/2019      Reactions   Iohexol Hives, Itching       Moxifloxacin Hives, Itching      Medication List    STOP taking these medications   ondansetron 4 MG disintegrating tablet Commonly known as: Zofran ODT     TAKE these medications   acetaminophen 500 MG tablet Commonly known as: TYLENOL Take 2 tablets (1,000 mg total) by mouth every 8 (eight) hours as needed. What changed:   how much to take  additional instructions   albuterol (2.5 MG/3ML) 0.083% nebulizer solution Commonly known as: PROVENTIL Take 3 mLs (2.5 mg total) by nebulization every 6 (six) hours as needed for wheezing or shortness of breath. What changed: Another medication with the  same name was removed. Continue taking this medication, and follow the directions you see here.   budesonide-formoterol 160-4.5 MCG/ACT inhaler Commonly known as: Symbicort Inhale 2 puffs into the lungs 2 (two) times daily.   cetirizine 10 MG tablet Commonly known as: ZYRTEC Take 1 tablet (10 mg total) by mouth daily.   ibuprofen 800 MG tablet Commonly known as: ADVIL Take 1 tablet (800 mg total) by mouth every 8 (eight) hours.   montelukast 10 MG tablet Commonly known as: SINGULAIR TAKE 10 MG BY MOUTH AT BEDTIME What changed:   how much to take  how to take this  when to take this  additional instructions   oxyCODONE 5 MG immediate release tablet Commonly known as: Oxy IR/ROXICODONE Take 1 tablet (5 mg total) by mouth every 6 (six) hours as needed for up to 5 days for severe pain.   prenatal multivitamin Tabs tablet Take 1 tablet by mouth daily at 12 noon.            Discharge Care Instructions  (From admission, onward)         Start     Ordered   11/08/19 0000  Discharge wound care:       Comments: New honeycomb dressing was placed 11/08/19. Please remove it by 11/13/19.   11/08/19 1202           Discharge home in stable condition Infant Feeding: Bottle and Breast Infant Disposition:home with mother Discharge instruction: per After Visit Summary and Postpartum booklet. Activity: Advance as tolerated. Pelvic rest for 6 weeks.  Diet: carb modified diet Anticipated Birth Control: BTL done PP Postpartum Appointment:6 weeks Additional Postpartum F/U: glucose testing at postpartum visit Future Appointments:No future appointments. Follow up Visit:  Follow-up Information    Dillard, Naima, MD. Schedule an appointment as soon as possible for a visit in 6 week(s).   Specialty: Obstetrics and Gynecology Contact information: [address] [place] [address] [phone]                   11/08/2019 Arrie Eastern, CNM

## 2019-11-10 LAB — SURGICAL PATHOLOGY

## 2020-02-11 ENCOUNTER — Ambulatory Visit: Payer: Medicare Other | Attending: Obstetrics and Gynecology | Admitting: Physical Therapy

## 2020-02-11 ENCOUNTER — Other Ambulatory Visit: Payer: Self-pay

## 2020-02-11 ENCOUNTER — Encounter: Payer: Self-pay | Admitting: Physical Therapy

## 2020-02-11 DIAGNOSIS — R278 Other lack of coordination: Secondary | ICD-10-CM | POA: Insufficient documentation

## 2020-02-11 DIAGNOSIS — M6281 Muscle weakness (generalized): Secondary | ICD-10-CM | POA: Diagnosis not present

## 2020-02-11 NOTE — Therapy (Signed)
[facility] [address] [place], [place], [address] Phone: [phone]   Fax:  [phone]  Physical Therapy Evaluation  Patient Details  Name: [name] MRN: [id number] Date of Birth: 1980/03/31 Referring Provider (PT): Arlyn Leak CNM   Encounter Date: 02/11/2020   PT End of Session - 02/11/20 1643    Visit Number 1    Date for PT Re-Evaluation 05/05/20    Authorization Type Medicare/medicaid    PT Start Time 1615    PT Stop Time 1700    PT Time Calculation (min) 45 min    Activity Tolerance Patient tolerated treatment well    Behavior During Therapy [facility] for tasks assessed/performed           Past Medical History:  Diagnosis Date  . Asthma     reservations to 2-3 timmes  a year that required ED visits/hospitalizations. Never intubated  . GERD (gastroesophageal reflux disease)   . Seasonal allergies     Past Surgical History:  Procedure Laterality Date  . CESAREAN SECTION    . CESAREAN SECTION N/A 10/25/2012   Procedure: CESAREAN SECTION;  Surgeon: Dorien Chihuahua. Richardson Dopp, MD;  Location: [facility];  Service: Obstetrics;  Laterality: N/A;  . CESAREAN SECTION N/A 03/05/2014   Procedure: CESAREAN SECTION;  Surgeon: Purcell Nails, MD;  Location: [facility];  Service: Obstetrics;  Laterality: N/A;  . CESAREAN SECTION WITH BILATERAL TUBAL LIGATION N/A 11/06/2019   Procedure: CESAREAN SECTION WITH BILATERAL TUBAL LIGATION;  Surgeon: Jaymes Graff, MD;  [facility]: [facility];  Service: Obstetrics;  Laterality: N/A;  . CHOLECYSTECTOMY N/A 04/09/2017   Procedure: LAPAROSCOPIC CHOLECYSTECTOMY;  Surgeon: Axel Filler, MD;  Location: [facility];  Service: General;  Laterality: N/A;  . FRACTURE SURGERY     left middle finger    There were no vitals filed for this visit.    Subjective Assessment - 02/11/20 1622    Subjective Patient has been leaking urine in 2014 when she had her son. Patient just had a child on  11/06/2019. When patient gets the urge she must go to the bathroom immediately. Patient has times she is not able to hold her bladder. Feels the urgency all the time.    Patient Stated Goals be able to hold her urine and reduce the urgency    Currently in Pain? No/denies              [facility] PT Assessment - 02/11/20 0001      Assessment   Medical Diagnosis N39.3 Female stress incontinence    Referring Provider (PT) Arlyn Leak CNM    Onset Date/Surgical Date --   2014   Prior Therapy none      Precautions   Precautions None      Restrictions   Weight Bearing Restrictions No      Balance Screen   Has the patient fallen in the past 6 months No    Has the patient had a decrease in activity level because of a fear of falling?  No    Is the patient reluctant to leave their home because of a fear of falling?  No      Home [employer] information [employer] manager residence      Prior Function   Level of Independence Independent    Vocation On disability    Leisure try to      Cognition   Overall Cognitive Status Within Functional Limits for tasks assessed  Observation/Other Assessments   Skin Integrity tenderness located on the c-section scar; tightness on the scar and around    Focus on Therapeutic Outcomes (FOTO)  UIQ-7  33 pts      Posture/Postural Control   Posture/Postural Control No significant limitations      ROM / Strength   AROM / PROM / Strength PROM;Strength;AROM      AROM   Right Hip Extension -10    Left Hip Extension 0    Lumbar Extension decreased by 50%    Lumbar - Right Side Bend decreased by 25%    Lumbar - Left Side Bend decreased by 25%    Lumbar - Right Rotation decreased by 25%    Lumbar - Left Rotation decreased by 25%      Strength   Right Hip Extension 3-/5    Right Hip ABduction 4-/5    Right Hip ADduction 4/5    Left Hip Extension 3/5    Left Hip ABduction 3/5      Palpation   Palpation comment decreased lower rib cage  movement;                      Objective measurements completed on examination: See above findings.     Pelvic Floor Special Questions - 02/11/20 0001    Prior Pregnancies Yes    Number of Pregnancies 4    Number of C-Sections 4    Currently Sexually Active No    Urinary Leakage Yes   no leakage at night   Pad use none    Activities that cause leaking With strong urge    Urinary urgency Yes    Urinary frequency every 30 -45 minutes    Fecal incontinence No   strain to have a bowel movements; pushes on vaginal for bowel movement; type 4   Caffeine beverages water, juice    Skin Integrity Intact    Pelvic Floor Internal Exam Patient confirms identification to assess pelvic floor and treatment    Exam Type Vaginal    Palpation fascial restrictions on the left side of bladder, tenderness located on the ischiococcygeus, tightness on the bulbocavernosus    Strength weak squeeze, no lift   2/5 on right, 3/5 on left   Tone unable to bulge the pelvic floor or use breath to asseist her                      PT Short Term Goals - 02/11/20 1706      PT SHORT TERM GOAL #1   Title Pt will be I with initial HEP for diphragmatic breathing    Baseline --    Time 4    Period Weeks    Status New    Target Date 03/10/20      PT SHORT TERM GOAL #2   Title patient will be able to demonstrate scar mobilzation to do at home to her c-section scar    Baseline ---    Time 4    Period Weeks    Target Date 03/10/20      PT SHORT TERM GOAL #3   Title able to demonstrate abdominal massage to assist in peristalic motion of the intestines    Baseline --    Time 4    Period Weeks    Status New    Target Date 03/10/20             PT Long Term Goals - 02/11/20  1708      PT LONG TERM GOAL #1   Title Patient will independent with advanced core and flexibility exercises    Baseline --    Time 12    Period Weeks    Status New    Target Date 05/05/20      PT LONG  TERM GOAL #2   Title patient will be able to delay the urge to slowly walk to the bathroom and sit on the commode without urinary leakage    Baseline ----    Time 12    Period Weeks    Status New    Target Date 05/05/20      PT LONG TERM GOAL #3   Title urge to void during the day will improve by 75% so she is not feeling it constantly    Baseline ---    Time 8    Period Weeks    Status New    Target Date 04/07/20      PT LONG TERM GOAL #4   Title able to have a bowel movement without straining 75% of the time due to the ability to bulge her pelvic floor    Baseline ---    Time 12    Period Weeks    Status New    Target Date 05/05/20      PT LONG TERM GOAL #5   Title ---    Baseline ---                  Plan - 02/11/20 1656    Clinical Impression Statement Patient is a 40 year old femele with urinary leakage since 2014 when she had a child. Patient has had 4 children by c-section. Patient last child was on 11/06/2019. Patient c-section scar is restricted with tenderness. Patient does not expand her lower rib cage well and unable to breath diaphragmatically. Patient has limited bilateral hip extension and weakness. Patient is not able to bulge her pelvic floor and has difficulty with constipation and straining. Pelvic floor strength on the left is 3/5 and right is 2/5. Patient has fascial restrictions on the left side of the bladder internally and along the levator ani muscles. Patient will leak urine when she has the urge to void. She will a constant urge to void all day. Patient will benefit from skilled therapy to improve pelvic floor coordination and strength while releasing her c-section scar.    Personal Factors and Comorbidities Fitness;Past/Current Experience;Comorbidity 1    Comorbidities 4 c-sections; last c-section on 11/06/19    Examination-Activity Limitations Continence;Toileting    Stability/Clinical Decision Making Stable/Uncomplicated    Clinical Decision  Making Low    Rehab Potential Excellent    PT Frequency 1x / week    PT Duration 12 weeks    PT Treatment/Interventions ADLs/Self Care Home Management;Biofeedback;Cryotherapy;Electrical Stimulation;Moist Heat;Ultrasound;Neuromuscular re-education;Therapeutic exercise;Therapeutic activities;Patient/family education;Manual techniques;Dry needling;Scar mobilization;Taping;Spinal Manipulations    PT Next Visit Plan c-section scar mobilization; manual internal work to the ishiococcygues, along the left side of the bladder and bulbocavernosus; hip flexor stretches; abdominal massage; diaphragmatic breathing    Consulted and Agree with Plan of Care Patient           Patient will benefit from skilled therapeutic intervention in order to improve the following deficits and impairments:  Decreased coordination,Increased fascial restricitons,Difficulty walking,Decreased activity tolerance,Decreased endurance,Increased muscle spasms,Decreased mobility,Decreased scar mobility,Decreased strength  Visit Diagnosis: Muscle weakness (generalized) - Plan: PT plan of care cert/re-cert  Other lack of coordination -  Plan: PT plan of care cert/re-cert     Problem List Patient Active Problem List   Diagnosis Date Noted  . Maternal anemia, with delivery - IDA w/ superimposed ABL anemia 11/07/2019  . Encounter for maternal care for low transverse scar from repeat cesarean delivery 11/06/2019  . S/P cesarean section 11/06/2019  . Postpartum care following cesarean delivery 10/22 11/06/2019  . AMA (advanced maternal age) multigravida 35+ 11/04/2019  . Gestational diabetes mellitus (GDM), antepartum 09/23/2019  . Pneumonia due to COVID-19 virus 08/31/2019  . Peptic ulcer disease 02/01/2019  . Acute bilateral low back pain with bilateral sciatica 09/10/2016  . Asthma 06/22/2010  . Morbid obesity with BMI of 40.0-44.9, adult (HCC) 05/24/2006    Eulis Foster, PT 02/11/20 5:12 PM   [facility] [address], [address] [place], [place], [address] Phone: [phone]   Fax:  [phone]  Name: FRANCISCA LANGENDERFER MRN: [id number] Date of Birth: January 16, 1980

## 2020-02-22 ENCOUNTER — Other Ambulatory Visit: Payer: Self-pay | Admitting: Family Medicine

## 2020-02-23 ENCOUNTER — Other Ambulatory Visit: Payer: Self-pay

## 2020-02-23 DIAGNOSIS — J4541 Moderate persistent asthma with (acute) exacerbation: Secondary | ICD-10-CM

## 2020-02-25 MED ORDER — BUDESONIDE-FORMOTEROL FUMARATE 160-4.5 MCG/ACT IN AERO: 2.0000 | INHALATION_SPRAY | Freq: Two times a day (BID) | RESPIRATORY_TRACT | 1 refills | Status: DC

## 2020-03-28 ENCOUNTER — Encounter: Payer: Self-pay | Admitting: Physical Therapy

## 2020-03-28 ENCOUNTER — Ambulatory Visit: Payer: Medicare Other | Attending: Obstetrics and Gynecology | Admitting: Physical Therapy

## 2020-03-28 ENCOUNTER — Other Ambulatory Visit: Payer: Self-pay

## 2020-03-28 DIAGNOSIS — R278 Other lack of coordination: Secondary | ICD-10-CM | POA: Diagnosis not present

## 2020-03-28 DIAGNOSIS — M6281 Muscle weakness (generalized): Secondary | ICD-10-CM | POA: Diagnosis not present

## 2020-03-28 NOTE — Therapy (Signed)
[facility] [address] [place], [place], [address] Phone: [phone]   Fax:  [phone]  Physical Therapy Treatment  Patient Details  Name: [name] MRN: [id number] Date of Birth: May 07, 1980 Referring Provider (PT): Arlyn Leak CNM   Encounter Date: 03/28/2020   PT End of Session - 03/28/20 1656    Visit Number 2    Date for PT Re-Evaluation 05/05/20    Authorization Type Medicare/medicaid    PT Start Time 1615    PT Stop Time 1653    PT Time Calculation (min) 38 min    Activity Tolerance Patient tolerated treatment well    Behavior During Therapy [facility] for tasks assessed/performed           Past Medical History:  Diagnosis Date  . Asthma     reservations to 2-3 timmes  a year that required ED visits/hospitalizations. Never intubated  . GERD (gastroesophageal reflux disease)   . Seasonal allergies     Past Surgical History:  Procedure Laterality Date  . CESAREAN SECTION    . CESAREAN SECTION N/A 10/25/2012   Procedure: CESAREAN SECTION;  Surgeon: Dorien Chihuahua. Richardson Dopp, MD;  Location: [facility];  Service: Obstetrics;  Laterality: N/A;  . CESAREAN SECTION N/A 03/05/2014   Procedure: CESAREAN SECTION;  Surgeon: Purcell Nails, MD;  Location: [facility];  Service: Obstetrics;  Laterality: N/A;  . CESAREAN SECTION WITH BILATERAL TUBAL LIGATION N/A 11/06/2019   Procedure: CESAREAN SECTION WITH BILATERAL TUBAL LIGATION;  Surgeon: Jaymes Graff, MD;  Location: [facility];  Service: Obstetrics;  Laterality: N/A;  . CHOLECYSTECTOMY N/A 04/09/2017   Procedure: LAPAROSCOPIC CHOLECYSTECTOMY;  Surgeon: Axel Filler, MD;  Location: [place] [facility] OR;  Service: General;  Laterality: N/A;  . FRACTURE SURGERY     left middle finger    There were no vitals filed for this visit.   Subjective Assessment - 03/28/20 1620    Subjective I felt alot beter after last visit. I was able to hold my urine afterwards and more relaxed  after using the bathroom.    Patient Stated Goals be able to hold her urine and reduce the urgency    Currently in Pain? No/denies    Multiple Pain Sites No                             [facility] Adult PT Treatment/Exercise - 03/28/20 0001      Lumbar Exercises: Stretches   Hip Flexor Stretch Right;Left;1 rep;30 seconds    Hip Flexor Stretch Limitations supine      Lumbar Exercises: Supine   Isometric Hip Flexion 10 reps;5 seconds    Isometric Hip Flexion Limitations then push opposite hand into the opposite knee      Manual Therapy   Manual Therapy Soft tissue mobilization;Myofascial release    Manual therapy comments educated patient on c-section scar mobilization and abdominal massage    Soft tissue mobilization manual work to the c-section scar, suprapubically, and right side to the abdomen    Myofascial Release fascial release around the bladder, and right lower quadrant                  PT Education - 03/28/20 1654    Education Details Access Code: [email or web address]; scar massage    Person(s) Educated Patient    Methods Explanation;Demonstration;Verbal cues;Handout    Comprehension Returned demonstration;Verbalized understanding  PT Short Term Goals - 02/11/20 1706      PT SHORT TERM GOAL #1   Title Pt will be I with initial HEP for diphragmatic breathing    Baseline --    Time 4    Period Weeks    Status New    Target Date 03/10/20      PT SHORT TERM GOAL #2   Title patient will be able to demonstrate scar mobilzation to do at home to her c-section scar    Baseline ---    Time 4    Period Weeks    Target Date 03/10/20      PT SHORT TERM GOAL #3   Title able to demonstrate abdominal massage to assist in peristalic motion of the intestines    Baseline --    Time 4    Period Weeks    Status New    Target Date 03/10/20             PT Long Term Goals - 02/11/20 1708      PT LONG TERM GOAL #1   Title Patient will independent  with advanced core and flexibility exercises    Baseline --    Time 12    Period Weeks    Status New    Target Date 05/05/20      PT LONG TERM GOAL #2   Title patient will be able to delay the urge to slowly walk to the bathroom and sit on the commode without urinary leakage    Baseline ----    Time 12    Period Weeks    Status New    Target Date 05/05/20      PT LONG TERM GOAL #3   Title urge to void during the day will improve by 75% so she is not feeling it constantly    Baseline ---    Time 8    Period Weeks    Status New    Target Date 04/07/20      PT LONG TERM GOAL #4   Title able to have a bowel movement without straining 75% of the time due to the ability to bulge her pelvic floor    Baseline ---    Time 12    Period Weeks    Status New    Target Date 05/05/20      PT LONG TERM GOAL #5   Title ---    Baseline ---                 Plan - 03/28/20 1656    Clinical Impression Statement Patient reports she has not had urinary leakage since the initial evaluation. Patient continues to have fascial restrictions around the c-section. Patient has learned abdominal massage to improve peristalic motion of the intestines. Patient has difficulty with abdominal contraction and will hold her breath and lift her chest. Patient felt increased stretch in the hip flexors with her stretch. Patient will benefit from skilled therapy to improve pelvic floor coordination and strength while releaseing her c-section scar.    Personal Factors and Comorbidities Fitness;Past/Current Experience;Comorbidity 1    Comorbidities 4 c-sections; last c-section on 11/06/19    Examination-Activity Limitations Continence;Toileting    Stability/Clinical Decision Making Stable/Uncomplicated    Rehab Potential Excellent    PT Frequency 1x / week    PT Duration 12 weeks    PT Treatment/Interventions ADLs/Self Care Home Management;Biofeedback;Cryotherapy;Electrical Stimulation;Moist  Heat;Ultrasound;Neuromuscular re-education;Therapeutic exercise;Therapeutic activities;Patient/family education;Manual techniques;Dry needling;Scar mobilization;Taping;Spinal Manipulations  PT Next Visit Plan release of the c-section scar; toileting; abdominal contraction; diaphragmatic breathing    PT Home Exercise Plan Access Code: [email or web address]    Recommended Other Services MD signed initial eval    Consulted and Agree with Plan of Care Patient           Patient will benefit from skilled therapeutic intervention in order to improve the following deficits and impairments:  Decreased coordination,Increased fascial restricitons,Difficulty walking,Decreased activity tolerance,Decreased endurance,Increased muscle spasms,Decreased mobility,Decreased scar mobility,Decreased strength  Visit Diagnosis: Muscle weakness (generalized)  Other lack of coordination     Problem List Patient Active Problem List   Diagnosis Date Noted  . Maternal anemia, with delivery - IDA w/ superimposed ABL anemia 11/07/2019  . Encounter for maternal care for low transverse scar from repeat cesarean delivery 11/06/2019  . S/P cesarean section 11/06/2019  . Postpartum care following cesarean delivery 10/22 11/06/2019  . AMA (advanced maternal age) multigravida 35+ 11/04/2019  . Gestational diabetes mellitus (GDM), antepartum 09/23/2019  . Pneumonia due to COVID-19 virus 08/31/2019  . Peptic ulcer disease 02/01/2019  . Acute bilateral low back pain with bilateral sciatica 09/10/2016  . Asthma 06/22/2010  . Morbid obesity with BMI of 40.0-44.9, adult (HCC) 05/24/2006    Eulis Foster, PT 03/28/20 5:02 PM   [facility] [address], [address] [place], [place], [address] Phone: [phone]   Fax:  [phone]  Name: KESHA HURRELL MRN: [id number] Date of Birth: May 06, 1980

## 2020-03-28 NOTE — Patient Instructions (Signed)
Access Code: [email or web address] URL: [email or web address] Date: 03/28/2020 Prepared by: Eulis Foster  Exercises Hooklying Isometric Hip Flexion - 1 x daily - 7 x weekly - 1 sets - 10 reps - 5 sec hold Hooklying Isometric Hip Flexion with Opposite Arm - 1 x daily - 7 x weekly - 1 sets - 10 reps - 5 sec hold Modified Thomas Stretch - 1 x daily - 7 x weekly - 1 sets - 2 reps - 30 sec hold Supine Abdominal Wall Massage - 1 x daily - 7 x weekly - 1 sets - 10 reps - 2 min hold Child's Pose Stretch - 1 x daily - 7 x weekly - 1 sets - 2 reps - 30 sec hold [facility] [address], [address] [place], [place] [address] Phone # [phone] Fax [phone]

## 2020-04-04 ENCOUNTER — Encounter: Payer: Medicare Other | Admitting: Physical Therapy

## 2020-04-13 ENCOUNTER — Ambulatory Visit: Payer: Medicare Other | Admitting: Physical Therapy

## 2020-04-13 ENCOUNTER — Encounter: Payer: Self-pay | Admitting: Physical Therapy

## 2020-04-13 ENCOUNTER — Other Ambulatory Visit: Payer: Self-pay

## 2020-04-13 DIAGNOSIS — R278 Other lack of coordination: Secondary | ICD-10-CM | POA: Diagnosis not present

## 2020-04-13 DIAGNOSIS — M6281 Muscle weakness (generalized): Secondary | ICD-10-CM

## 2020-04-13 NOTE — Therapy (Signed)
[facility] [address], [address] [place], [place], [address] Phone: [phone]   Fax:  [phone]  Physical Therapy Treatment  Patient Details  Name: [name] MRN: [id number] Date of Birth: [birth year] Referring Provider (PT): Arlyn Leak CNM   Encounter Date: 04/13/2020   PT End of Session - 04/13/20 1621    Visit Number 3    Date for PT Re-Evaluation 05/05/20    Authorization Type Medicare/medicaid    Authorization - Visit Number 3    Authorization - Number of Visits 10    PT Start Time 1615    PT Stop Time 1653    PT Time Calculation (min) 38 min    Activity Tolerance Patient tolerated treatment well    Behavior During Therapy [facility] for tasks assessed/performed           Past Medical History:  Diagnosis Date  . Asthma     reservations to 2-3 timmes  a year that required ED visits/hospitalizations. Never intubated  . GERD (gastroesophageal reflux disease)   . Seasonal allergies     Past Surgical History:  Procedure Laterality Date  . CESAREAN SECTION    . CESAREAN SECTION N/A 10/25/2012   Procedure: CESAREAN SECTION;  Surgeon: Dorien Chihuahua. Richardson Dopp, MD;  Location: [facility];  Service: Obstetrics;  Laterality: N/A;  . CESAREAN SECTION N/A 03/05/2014   Procedure: CESAREAN SECTION;  Surgeon: Purcell Nails, MD;  Location: [facility];  Service: Obstetrics;  Laterality: N/A;  . CESAREAN SECTION WITH BILATERAL TUBAL LIGATION N/A 11/06/2019   Procedure: CESAREAN SECTION WITH BILATERAL TUBAL LIGATION;  Surgeon: Jaymes Graff, MD;  Location: [facility];  Service: Obstetrics;  Laterality: N/A;  . CHOLECYSTECTOMY N/A 04/09/2017   Procedure: LAPAROSCOPIC CHOLECYSTECTOMY;  Surgeon: Axel Filler, MD;  Location: [facility] OR;  Service: General;  Laterality: N/A;  . FRACTURE SURGERY     left middle finger    There were no vitals filed for this visit.   Subjective Assessment - 04/13/20 1624    Subjective no urinary leakage  since last visit.    Patient Stated Goals be able to hold her urine and reduce the urgency    Currently in Pain? Yes    Pain Score 8     Pain Location Leg    Pain Orientation Left    Pain Descriptors / Indicators Shooting;Aching    Pain Type Chronic pain    Pain Radiating Towards radiates down left leg    Pain Onset More than a month ago    Pain Frequency Intermittent    Aggravating Factors  standing up and moving    Pain Relieving Factors rest    Multiple Pain Sites No                             [facility] Adult PT Treatment/Exercise - 04/13/20 0001      Lumbar Exercises: Stretches   Active Hamstring Stretch Right;Left;3 reps;30 seconds    Active Hamstring Stretch Limitations using a strap an dflex and extend the left knee for neural tension    ITB Stretch Right;Left;30 seconds;3 reps    ITB Stretch Limitations supine with strap      Knee/Hip Exercises: Standing   Other Standing Knee Exercises stand with pushing the foot into the wall with knee flexed at 90 degrees 5x holding for  5 sec each leg    Other Standing Knee Exercises reverse lunge 5 times  each side      Manual Therapy   Manual Therapy Joint mobilization;Soft tissue mobilization    Joint Mobilization joint mobilization to T10-L5 in sidely to gap the facets    Soft tissue mobilization using the addaday to the left hamstring with the left leg off the mat; right hip flexor and quad with right leg off mat and patient stretching it with strap                  PT Education - 04/13/20 1656    Education Details Access Code: [email or web address]    Person(s) Educated Patient    Methods Explanation;Demonstration;Verbal cues;Handout    Comprehension Returned demonstration;Verbalized understanding            PT Short Term Goals - 04/13/20 1623      PT SHORT TERM GOAL #1   Title Pt will be I with initial HEP for diphragmatic breathing    Time 4    Period Weeks    Status Achieved      PT SHORT TERM GOAL #2    Title patient will be able to demonstrate scar mobilzation to do at home to her c-section scar    Period Weeks    Status Achieved      PT SHORT TERM GOAL #3   Title able to demonstrate abdominal massage to assist in peristalic motion of the intestines    Time 4    Period Weeks    Status Achieved             PT Long Term Goals - 02/11/20 1708      PT LONG TERM GOAL #1   Title Patient will independent with advanced core and flexibility exercises    Baseline --    Time 12    Period Weeks    Status New    Target Date 05/05/20      PT LONG TERM GOAL #2   Title patient will be able to delay the urge to slowly walk to the bathroom and sit on the commode without urinary leakage    Baseline ----    Time 12    Period Weeks    Status New    Target Date 05/05/20      PT LONG TERM GOAL #3   Title urge to void during the day will improve by 75% so she is not feeling it constantly    Baseline ---    Time 8    Period Weeks    Status New    Target Date 04/07/20      PT LONG TERM GOAL #4   Title able to have a bowel movement without straining 75% of the time due to the ability to bulge her pelvic floor    Baseline ---    Time 12    Period Weeks    Status New    Target Date 05/05/20      PT LONG TERM GOAL #5   Title ---    Baseline ---                 Plan - 04/13/20 1657    Clinical Impression Statement Patient has not had urinary leakage since last visit. Patient constant urge to void has decreased by 75%. Patient has improved hamstring flexibility on the left after manual work. She is able to engage the right gluteal after therapy. Patient was able to extend her right hip better. Patient still has difficulty with toileting. Patient will benefit  from skilled therapy to improve pelvic floor coordination and strength while releasing her c-section scar.    Personal Factors and Comorbidities Fitness;Past/Current Experience;Comorbidity 1    Comorbidities 4 c-sections;  last c-section on 11/06/19    Examination-Activity Limitations Continence;Toileting    Stability/Clinical Decision Making Stable/Uncomplicated    Rehab Potential Excellent    PT Frequency 1x / week    PT Duration 12 weeks    PT Treatment/Interventions ADLs/Self Care Home Management;Biofeedback;Cryotherapy;Electrical Stimulation;Moist Heat;Ultrasound;Neuromuscular re-education;Therapeutic exercise;Therapeutic activities;Patient/family education;Manual techniques;Dry needling;Scar mobilization;Taping;Spinal Manipulations    PT Next Visit Plan work on pelvic drop and elongation of the pelvic floor; work on right hip extension actively and measuring    PT Home Exercise Plan Access Code: [email or web address]    Consulted and Agree with Plan of Care Patient           Patient will benefit from skilled therapeutic intervention in order to improve the following deficits and impairments:  Decreased coordination,Increased fascial restricitons,Difficulty walking,Decreased activity tolerance,Decreased endurance,Increased muscle spasms,Decreased mobility,Decreased scar mobility,Decreased strength  Visit Diagnosis: Muscle weakness (generalized)  Other lack of coordination     Problem List Patient Active Problem List   Diagnosis Date Noted  . Maternal anemia, with delivery - IDA w/ superimposed ABL anemia 11/07/2019  . Encounter for maternal care for low transverse scar from repeat cesarean delivery 11/06/2019  . S/P cesarean section 11/06/2019  . Postpartum care following cesarean delivery 10/22 11/06/2019  . AMA (advanced maternal age) multigravida 35+ 11/04/2019  . Gestational diabetes mellitus (GDM), antepartum 09/23/2019  . Pneumonia due to COVID-19 virus 08/31/2019  . Peptic ulcer disease 02/01/2019  . Acute bilateral low back pain with bilateral sciatica 09/10/2016  . Asthma 06/22/2010  . Morbid obesity with BMI of 40.0-44.9, adult (HCC) 05/24/2006    Eulis Foster, PT 04/13/20 5:01 PM   [facility] [address] [place], [place], [address] Phone: [phone]   Fax:  [phone]  Name: [name] MRN: [id number] Date of Birth: 03-Feb-1980

## 2020-04-13 NOTE — Patient Instructions (Signed)
Access Code: [email or web address] URL: [email or web address] Date: 04/13/2020 Prepared by: Eulis Foster  Exercises Hooklying Isometric Hip Flexion - 1 x daily - 7 x weekly - 1 sets - 10 reps - 5 sec hold Hooklying Isometric Hip Flexion with Opposite Arm - 1 x daily - 7 x weekly - 1 sets - 10 reps - 5 sec hold Modified Thomas Stretch - 1 x daily - 7 x weekly - 1 sets - 2 reps - 30 sec hold Supine Abdominal Wall Massage - 1 x daily - 7 x weekly - 1 sets - 10 reps - 2 min hold Child's Pose Stretch - 1 x daily - 7 x weekly - 1 sets - 2 reps - 30 sec hold Hooklying Hamstring Stretch with Strap - 1 x daily - 7 x weekly - 1 sets - 10 reps Supine ITB Stretch with Strap - 1 x daily - 7 x weekly - 1 sets - 2 reps - 30 sec hold Standing Hip Extension with Knee Bent - 1 x daily - 7 x weekly - 1 sets - 5 reps - 5 sec hold Reverse Lunge - 1 x daily - 7 x weekly - 1 sets - 5 reps [facility] [address], [address] [place], [place] [address] Phone # [phone] Fax [phone]

## 2020-04-18 ENCOUNTER — Encounter: Payer: Medicare Other | Admitting: Physical Therapy

## 2020-04-25 ENCOUNTER — Encounter: Payer: Self-pay | Admitting: Physical Therapy

## 2020-04-25 ENCOUNTER — Other Ambulatory Visit: Payer: Self-pay

## 2020-04-25 ENCOUNTER — Ambulatory Visit: Payer: Medicare Other | Attending: Obstetrics and Gynecology | Admitting: Physical Therapy

## 2020-04-25 DIAGNOSIS — M6281 Muscle weakness (generalized): Secondary | ICD-10-CM | POA: Insufficient documentation

## 2020-04-25 DIAGNOSIS — R278 Other lack of coordination: Secondary | ICD-10-CM | POA: Insufficient documentation

## 2020-04-25 NOTE — Therapy (Signed)
[facility] [address], [address] [place], [place], [address] Phone: [phone]   Fax:  [phone]  Physical Therapy Treatment  Patient Details  Name: [name] MRN: [id number] Date of Birth: Dec 30, 1980 Referring Provider (PT): Arlyn Leak CNM   Encounter Date: 04/25/2020   PT End of Session - 04/25/20 1700    Visit Number 4    Date for PT Re-Evaluation 05/05/20    Authorization Type Medicare/medicaid    Authorization - Visit Number 4    Authorization - Number of Visits 10    PT Start Time 1615    PT Stop Time 1653    PT Time Calculation (min) 38 min    Activity Tolerance Patient tolerated treatment well    Behavior During Therapy [facility] for tasks assessed/performed           Past Medical History:  Diagnosis Date  . Asthma     reservations to 2-3 timmes  a year that required ED visits/hospitalizations. Never intubated  . GERD (gastroesophageal reflux disease)   . Seasonal allergies     Past Surgical History:  Procedure Laterality Date  . CESAREAN SECTION    . CESAREAN SECTION N/A 10/25/2012   Procedure: CESAREAN SECTION;  Surgeon: Dorien Chihuahua. Richardson Dopp, MD;  Location: [facility];  Service: Obstetrics;  Laterality: N/A;  . CESAREAN SECTION N/A 03/05/2014   Procedure: CESAREAN SECTION;  Surgeon: Purcell Nails, MD;  Location: [facility];  Service: Obstetrics;  Laterality: N/A;  . CESAREAN SECTION WITH BILATERAL TUBAL LIGATION N/A 11/06/2019   Procedure: CESAREAN SECTION WITH BILATERAL TUBAL LIGATION;  Surgeon: Jaymes Graff, MD;  Location: [facility];  Service: Obstetrics;  Laterality: N/A;  . CHOLECYSTECTOMY N/A 04/09/2017   Procedure: LAPAROSCOPIC CHOLECYSTECTOMY;  Surgeon: Axel Filler, MD;  Location: [facility] OR;  Service: General;  Laterality: N/A;  . FRACTURE SURGERY     left middle finger    There were no vitals filed for this visit.   Subjective Assessment - 04/25/20 1620    Subjective IU have been pretty  good and 1 episode of leakage. the urgency will vary. If has a whole bottle of water and go from sit to stand has to urinate. The urgency comes out of nowhere.    Patient Stated Goals be able to hold her urine and reduce the urgency    Currently in Pain? No/denies              [facility] PT Assessment - 04/25/20 0001      Assessment   Medical Diagnosis N39.3 Female stress incontinence    Referring Provider (PT) Arlyn Leak CNM    Onset Date/Surgical Date --   2014     AROM   Right Hip Extension -5   after manual work                     Pelvic Floor Special Questions - 04/25/20 0001    Urinary frequency every hour goes to the bathroom             [facility] Adult PT Treatment/Exercise - 04/25/20 0001      Lumbar Exercises: Aerobic   Nustep level 2 for 5 minutes while assessing the patient      Lumbar Exercises: Seated   Other Seated Lumbar Exercises seated pelvic contraction holding for 5 sec 10x      Lumbar Exercises: Supine   Bridge 15 reps    Bridge Limitations vc to lift  up hips    Isometric Hip Flexion 5 reps;5 seconds    Isometric Hip Flexion Limitations each leg, can feel the pelvic floor contract      Knee/Hip Exercises: Standing   Other Standing Knee Exercises standing Pallof exercises with red band 15 each way      Knee/Hip Exercises: Prone   Hip Extension Strengthening;Right;Left;1 set;15 reps    Hip Extension Limitations VC to not lift the hip    Other Prone Exercises prone stretched right hip flexor and hip rotators    Other Prone Exercises bilateral hip rotation 15 times      Manual Therapy   Manual Therapy Joint mobilization    Joint Mobilization antieror glide of the right femoral hip to improve hip extension                    PT Short Term Goals - 04/13/20 1623      PT SHORT TERM GOAL #1   Title Pt will be I with initial HEP for diphragmatic breathing    Time 4    Period Weeks    Status Achieved      PT SHORT TERM  GOAL #2   Title patient will be able to demonstrate scar mobilzation to do at home to her c-section scar    Period Weeks    Status Achieved      PT SHORT TERM GOAL #3   Title able to demonstrate abdominal massage to assist in peristalic motion of the intestines    Time 4    Period Weeks    Status Achieved             PT Long Term Goals - 02/11/20 1708      PT LONG TERM GOAL #1   Title Patient will independent with advanced core and flexibility exercises    Baseline --    Time 12    Period Weeks    Status New    Target Date 05/05/20      PT LONG TERM GOAL #2   Title patient will be able to delay the urge to slowly walk to the bathroom and sit on the commode without urinary leakage    Baseline ----    Time 12    Period Weeks    Status New    Target Date 05/05/20      PT LONG TERM GOAL #3   Title urge to void during the day will improve by 75% so she is not feeling it constantly    Baseline ---    Time 8    Period Weeks    Status New    Target Date 04/07/20      PT LONG TERM GOAL #4   Title able to have a bowel movement without straining 75% of the time due to the ability to bulge her pelvic floor    Baseline ---    Time 12    Period Weeks    Status New    Target Date 05/05/20      PT LONG TERM GOAL #5   Title ---    Baseline ---                 Plan - 04/25/20 1701    Clinical Impression Statement Patient leaks most of the time when she goes from sit to stand. She reports her leakage is 50% better. Patient is now going to the bathroom every hour instead of every 45 minutes. Patient was  able to feel her pelvic floor contract with her exercises. Patient is learning more core stabilizaiton exercises today and she was able to feel the muscles work. She has increased right hip extension by 5 degrees after manual work. Patient will benefit from skilled therapy to improve pelvic floor coordination and strength while releasing her c-section scar.    Personal  Factors and Comorbidities Fitness;Past/Current Experience;Comorbidity 1    Comorbidities 4 c-sections; last c-section on 11/06/19    Examination-Activity Limitations Continence;Toileting    Stability/Clinical Decision Making Stable/Uncomplicated    Rehab Potential Excellent    PT Frequency 1x / week    PT Duration 12 weeks    PT Treatment/Interventions ADLs/Self Care Home Management;Biofeedback;Cryotherapy;Electrical Stimulation;Moist Heat;Ultrasound;Neuromuscular re-education;Therapeutic exercise;Therapeutic activities;Patient/family education;Manual techniques;Dry needling;Scar mobilization;Taping;Spinal Manipulations    PT Next Visit Plan ask about bowel movements, continue with right hip extension exercise, see if patient is able to elongate the pelvic floor, check on BM; check on c-section scar    PT Home Exercise Plan Access Code: [email or web address]    Consulted and Agree with Plan of Care Patient           Patient will benefit from skilled therapeutic intervention in order to improve the following deficits and impairments:  Decreased coordination,Increased fascial restricitons,Difficulty walking,Decreased activity tolerance,Decreased endurance,Increased muscle spasms,Decreased mobility,Decreased scar mobility,Decreased strength  Visit Diagnosis: Muscle weakness (generalized)  Other lack of coordination     Problem List Patient Active Problem List   Diagnosis Date Noted  . Maternal anemia, with delivery - IDA w/ superimposed ABL anemia 11/07/2019  . Encounter for maternal care for low transverse scar from repeat cesarean delivery 11/06/2019  . S/P cesarean section 11/06/2019  . Postpartum care following cesarean delivery 10/22 11/06/2019  . AMA (advanced maternal age) multigravida 35+ 11/04/2019  . Gestational diabetes mellitus (GDM), antepartum 09/23/2019  . Pneumonia due to COVID-19 virus 08/31/2019  . Peptic ulcer disease 02/01/2019  . Acute bilateral low back pain with bilateral  sciatica 09/10/2016  . Asthma 06/22/2010  . Morbid obesity with BMI of 40.0-44.9, adult (HCC) 05/24/2006    Eulis Foster, PT 04/25/20 5:08 PM   [facility] [address], [address] [place], [place], [address] Phone: [phone]   Fax:  [phone]  Name: [name] MRN: [id number] Date of Birth: 07/07/1980

## 2020-05-02 ENCOUNTER — Other Ambulatory Visit: Payer: Self-pay

## 2020-05-02 ENCOUNTER — Ambulatory Visit: Payer: Medicare Other | Admitting: Physical Therapy

## 2020-05-02 ENCOUNTER — Encounter: Payer: Self-pay | Admitting: Physical Therapy

## 2020-05-02 DIAGNOSIS — M6281 Muscle weakness (generalized): Secondary | ICD-10-CM | POA: Diagnosis not present

## 2020-05-02 DIAGNOSIS — R278 Other lack of coordination: Secondary | ICD-10-CM | POA: Diagnosis not present

## 2020-05-02 NOTE — Patient Instructions (Signed)
Access Code: [email or web address] URL: [email or web address] Date: 05/02/2020 Prepared by: Eulis Foster  Exercises Hooklying Isometric Hip Flexion - 1 x daily - 7 x weekly - 1 sets - 10 reps - 5 sec hold Hooklying Isometric Hip Flexion with Opposite Arm - 1 x daily - 7 x weekly - 1 sets - 10 reps - 5 sec hold Modified Thomas Stretch - 1 x daily - 7 x weekly - 1 sets - 2 reps - 30 sec hold Supine Abdominal Wall Massage - 1 x daily - 7 x weekly - 1 sets - 10 reps - 2 min hold Child's Pose Stretch - 1 x daily - 7 x weekly - 1 sets - 2 reps - 30 sec hold Hooklying Hamstring Stretch with Strap - 1 x daily - 7 x weekly - 1 sets - 10 reps Supine ITB Stretch with Strap - 1 x daily - 7 x weekly - 1 sets - 2 reps - 30 sec hold Standing Hip Extension with Knee Bent - 1 x daily - 7 x weekly - 1 sets - 10 reps - 2 sec hold Reverse Lunge - 1 x daily - 7 x weekly - 1 sets - 5 reps Seated Hamstring Stretch - 1 x daily - 7 x weekly - 1 sets - 2 reps - 30 sec hold Standing ITB Stretch - 1 x daily - 7 x weekly - 1 sets - 2 reps - 15 sec hold Gastroc Stretch on Wall - 1 x daily - 7 x weekly - 1 sets - 2 reps - 30 sec hold [facility] [address], [address] [place], [place] [address] Phone # [phone] Fax [phone]

## 2020-05-02 NOTE — Therapy (Signed)
[facility] [address], [place], [place], [address] Phone: [phone]   Fax:  [phone]  Physical Therapy Treatment  Patient Details  Name: [name] MRN: [id number] Date of Birth: [birth year] Referring Provider (PT): Fernanda Drum CNM   Encounter Date: 05/02/2020   PT End of Session - 05/02/20 1632    Visit Number 5    Date for PT Re-Evaluation 05/05/20    Authorization Type Medicare/medicaid    Authorization - Visit Number 5    Authorization - Number of Visits 10    PT Start Time 1630   came late   PT Stop Time 1700    PT Time Calculation (min) 30 min    Activity Tolerance Patient tolerated treatment well    Behavior During Therapy [facility] for tasks assessed/performed           Past Medical History:  Diagnosis Date  . Asthma     reservations to 2-3 timmes  a year that required ED visits/hospitalizations. Never intubated  . GERD (gastroesophageal reflux disease)   . Seasonal allergies     Past Surgical History:  Procedure Laterality Date  . CESAREAN SECTION    . CESAREAN SECTION N/A 10/25/2012   Procedure: CESAREAN SECTION;  Surgeon: Maeola Sarah. Landry Mellow, MD;  Location: [facility];  Service: Obstetrics;  Laterality: N/A;  . CESAREAN SECTION N/A 03/05/2014   Procedure: CESAREAN SECTION;  Surgeon: Delice Lesch, MD;  Location: [facility];  Service: Obstetrics;  Laterality: N/A;  . CESAREAN SECTION WITH BILATERAL TUBAL LIGATION N/A 11/06/2019   Procedure: CESAREAN SECTION WITH BILATERAL TUBAL LIGATION;  Surgeon: Crawford Givens, MD;  Location: [facility];  Service: Obstetrics;  Laterality: N/A;  . CHOLECYSTECTOMY N/A 04/09/2017   Procedure: LAPAROSCOPIC CHOLECYSTECTOMY;  Surgeon: Ralene Ok, MD;  Location: [facility];  Service: General;  Laterality: N/A;  . FRACTURE SURGERY     left middle finger    There were no vitals filed for this visit.       [facility] PT Assessment - 05/02/20 0001      Assessment    Medical Diagnosis N39.3 Female stress incontinence    Referring Provider (PT) Fernanda Drum CNM    Onset Date/Surgical Date --   2014   Prior Therapy none      Precautions   Precautions None      Restrictions   Weight Bearing Restrictions No      Cognition   Overall Cognitive Status Within Functional Limits for tasks assessed      ROM / Strength   AROM / PROM / Strength AROM;PROM;Strength      AROM   Lumbar Extension full    Lumbar - Right Side Bend decreased by 25%    Lumbar - Left Side Bend full    Lumbar - Right Rotation full    Lumbar - Left Rotation full      Strength   Right Hip Extension 5/5    Right Hip ABduction 5/5    Right Hip ADduction 5/5    Left Hip Extension 5/5    Left Hip ABduction 5/5                      Pelvic Floor Special Questions - 05/02/20 0001    Urinary Leakage No    Urinary urgency No    Exam Type Deferred             [facility] Adult PT Treatment/Exercise -  05/02/20 0001      Lumbar Exercises: Stretches   Active Hamstring Stretch Right;Left;1 rep;30 seconds    Active Hamstring Stretch Limitations sitting    Hip Flexor Stretch Right;Left;1 rep;30 seconds    Hip Flexor Stretch Limitations standing    ITB Stretch Right;Left;1 rep;30 seconds    ITB Stretch Limitations standing      Lumbar Exercises: Aerobic   Elliptical level 1 for 3 minutes while assessing patient      Knee/Hip Exercises: Standing   Hip Extension Stengthening;Right;Left;1 set;10 reps;Knee bent    Extension Limitations hold 5 sec                  PT Education - 05/02/20 1657    Education Details Access Code: [email or web address]    Person(s) Educated Patient    Methods Explanation;Demonstration;Verbal cues;Handout    Comprehension Returned demonstration;Verbalized understanding            PT Short Term Goals - 04/13/20 1623      PT SHORT TERM GOAL #1   Title Pt will be I with initial HEP for diphragmatic breathing    Time 4    Period Weeks     Status Achieved      PT SHORT TERM GOAL #2   Title patient will be able to demonstrate scar mobilzation to do at home to her c-section scar    Period Weeks    Status Achieved      PT SHORT TERM GOAL #3   Title able to demonstrate abdominal massage to assist in peristalic motion of the intestines    Time 4    Period Weeks    Status Achieved             PT Long Term Goals - 05/02/20 1637      PT LONG TERM GOAL #1   Title Patient will independent with advanced core and flexibility exercises    Time 12    Period Weeks    Status Achieved      PT LONG TERM GOAL #2   Title patient will be able to delay the urge to slowly walk to the bathroom and sit on the commode without urinary leakage    Time 12    Period Weeks    Status Achieved      PT LONG TERM GOAL #3   Title urge to void during the day will improve by 75% so she is not feeling it constantly    Time 8    Period Weeks    Status Achieved      PT LONG TERM GOAL #4   Title able to have a bowel movement without straining 75% of the time due to the ability to bulge her pelvic floor    Time 12    Period Weeks    Status Achieved                 Plan - 05/02/20 1658    Clinical Impression Statement Patient is not having urinary leakage. The urge to have to go to the bathroom constantly has decreased by 75%. Patient straining to have a bowel movement has improved by 75%. Patient is independent with her HEP. Patient lumbar ROM is full and bilateral hip strength is 5/5. Patient has improved mobility of her c-section scar. She is ready for discharge.    Personal Factors and Comorbidities Fitness;Past/Current Experience;Comorbidity 1    Comorbidities 4 c-sections; last c-section on 11/06/19    Examination-Activity Limitations Continence;Toileting  Stability/Clinical Decision Making Stable/Uncomplicated    Rehab Potential Excellent    PT Treatment/Interventions ADLs/Self Care Home  Management;Biofeedback;Cryotherapy;Electrical Stimulation;Moist Heat;Ultrasound;Neuromuscular re-education;Therapeutic exercise;Therapeutic activities;Patient/family education;Manual techniques;Dry needling;Scar mobilization;Taping;Spinal Manipulations    PT Next Visit Plan Discharge to HEP    PT Home Exercise Plan Access Code: [email or web address]    Consulted and Agree with Plan of Care Patient           Patient will benefit from skilled therapeutic intervention in order to improve the following deficits and impairments:  Decreased coordination,Increased fascial restricitons,Difficulty walking,Decreased activity tolerance,Decreased endurance,Increased muscle spasms,Decreased mobility,Decreased scar mobility,Decreased strength  Visit Diagnosis: Muscle weakness (generalized)  Other lack of coordination     Problem List Patient Active Problem List   Diagnosis Date Noted  . Maternal anemia, with delivery - IDA w/ superimposed ABL anemia 11/07/2019  . Encounter for maternal care for low transverse scar from repeat cesarean delivery 11/06/2019  . S/P cesarean section 11/06/2019  . Postpartum care following cesarean delivery 10/22 11/06/2019  . AMA (advanced maternal age) multigravida 35+ 11/04/2019  . Gestational diabetes mellitus (GDM), antepartum 09/23/2019  . Pneumonia due to COVID-19 virus 08/31/2019  . Peptic ulcer disease 02/01/2019  . Acute bilateral low back pain with bilateral sciatica 09/10/2016  . Asthma 06/22/2010  . Morbid obesity with BMI of 40.0-44.9, adult ([place]) 05/24/2006    Earlie Counts, PT 05/02/20 5:06 PM   [facility] [address] [place], [place], [address] Phone: [phone]   Fax:  [phone]  Name: [name] MRN: [id number] Date of Birth: 1980-07-11  PHYSICAL THERAPY DISCHARGE SUMMARY  Visits from Start of Care: 5  Current functional level related to goals / functional outcomes: See  above.    Remaining deficits: See above.    Education / Equipment: HEP Plan: Patient agrees to discharge.  Patient goals were met. Patient is being discharged due to                                                    Thank you for the referral. Earlie Counts, PT 05/02/20 5:06 PM   ?????

## 2020-05-09 ENCOUNTER — Encounter: Payer: Medicare Other | Admitting: Physical Therapy

## 2020-05-16 ENCOUNTER — Encounter: Payer: Medicare Other | Admitting: Physical Therapy

## 2020-05-24 ENCOUNTER — Other Ambulatory Visit: Payer: Self-pay

## 2020-05-24 ENCOUNTER — Ambulatory Visit (INDEPENDENT_AMBULATORY_CARE_PROVIDER_SITE_OTHER): Payer: Medicare Other | Admitting: Family Medicine

## 2020-05-24 ENCOUNTER — Encounter: Payer: Self-pay | Admitting: Family Medicine

## 2020-05-24 DIAGNOSIS — M5441 Lumbago with sciatica, right side: Secondary | ICD-10-CM | POA: Diagnosis not present

## 2020-05-24 DIAGNOSIS — R21 Rash and other nonspecific skin eruption: Secondary | ICD-10-CM | POA: Diagnosis not present

## 2020-05-24 DIAGNOSIS — M5442 Lumbago with sciatica, left side: Secondary | ICD-10-CM

## 2020-05-24 DIAGNOSIS — I10 Essential (primary) hypertension: Secondary | ICD-10-CM | POA: Insufficient documentation

## 2020-05-24 MED ORDER — DICLOFENAC SODIUM 1 % EX GEL: 2.0000 g | Freq: Four times a day (QID) | CUTANEOUS | 0 refills | Status: AC

## 2020-05-24 NOTE — Progress Notes (Signed)
    SUBJECTIVE:   CHIEF COMPLAINT / HPI:   [name] returns today for management and evaluation of her sciatica with back pain, concern for melasma, and blood pressure check. Of note, the pt did have gestational diabetes and maternal anemia, both of which had normalized after delivery.  Sciatica with back pain Pt experienced severe back pain with bilateral sciatica during pregnancy, and wasn't able to walk for two months. She had a successful cesarean delivery in October 2021. She notes that she completed PT in the interim, has taken Advil and Tylenol, and endorses some mild improvement without successful resolution of her symptoms. She denies saddle anesthesia or urinary incontinence. Previously saw Ortho while pregnant and declined offer of injection because she wasn't comfortable with the procedure while pregnant. Review of 02/12/19 CT revealed DDD.   Concern for melasma The pt presents with new hyperpigmented areas above both eyebrows, in the central forehead, and just above her upper lip. Areas are similar to the pt's mother, and pt notes that her mom has melasma. Requests referral to dermatology for further management.  Elevated blood pressure BP elevated at 152/79 today on first check, and was 156/107 on recheck.   PERTINENT  PMH / PSH:  Asthma Acute b/l low back pain w/ b/l sciatica  OBJECTIVE:   BP (!) 156/107   Pulse 73   Ht 5\' 6"  (1.676 m)   Wt 265 lb 3.2 oz (120.3 kg)   SpO2 98%   BMI 42.80 kg/m   Physical Exam Constitutional:      Appearance: Normal appearance.  Cardiovascular:     Rate and Rhythm: Normal rate and regular rhythm.     Heart sounds: Normal heart sounds.  Pulmonary:     Effort: Pulmonary effort is normal.     Breath sounds: Normal breath sounds.  Musculoskeletal:     Lumbar back: Tenderness present.     Comments: Paraspinal tenderness into L upper glut  Skin:    General: Skin is warm and dry.  Neurological:     Mental Status: She is alert  and oriented to person, place, and time.    ASSESSMENT/PLAN:   Acute bilateral low back pain with bilateral sciatica Improved since pregnancy but still very bothersome for pt. Pt has had this for several months now despite PT and conservative management. Will order MRI Lumbar spine for further evaluation Recommend Voltaren gel RTC in 6 weeks  HBP (high blood pressure) 152/79 in clinic today and 156/107 on recheck. Asked pt to keep record of home BP checks in the interim and return for a nurse BP check later this week  Skin rash New hyperpigmented patches on b/l eyebrows, central forehead, and above upper lip. Mother with hx of Melasma. Pt requests referral to Dermatology.     , Medical Student [facility]     Patient seen along with MS3 student [facility]. I personally evaluated this patient along with the student, and verified all aspects of the history, physical exam, and medical decision making as documented by the student. I agree with the student's documentation and have made all necessary edits.  Marcelline Mates, MD  [facility]

## 2020-05-24 NOTE — Assessment & Plan Note (Signed)
New hyperpigmented patches on b/l eyebrows, central forehead, and above upper lip. Mother with hx of Melasma. Pt requests referral to Dermatology.

## 2020-05-24 NOTE — Patient Instructions (Signed)
Schedule nurse visit to recheck blood pressure later this week Check it at home as well, let me know how it's doing.  Ordering MRI of your back  Try voltaren gel on your back  Follow up with me in 6 weeks, sooner if needed  Be well, Dr. Pollie Meyer

## 2020-05-24 NOTE — Assessment & Plan Note (Signed)
152/79 in clinic today and 156/107 on recheck. Asked pt to keep record of home BP checks in the interim and return for a nurse BP check later this week

## 2020-05-24 NOTE — Assessment & Plan Note (Signed)
Improved since pregnancy but still very bothersome for pt. Pt has had this for several months now despite PT and conservative management. Will order MRI Lumbar spine for further evaluation Recommend Voltaren gel RTC in 6 weeks

## 2020-05-27 ENCOUNTER — Ambulatory Visit: Payer: Medicare Other

## 2020-06-03 ENCOUNTER — Ambulatory Visit: Payer: Medicare Other

## 2020-06-03 ENCOUNTER — Other Ambulatory Visit: Payer: Self-pay

## 2020-06-03 VITALS — BP 150/98

## 2020-06-03 DIAGNOSIS — I10 Essential (primary) hypertension: Secondary | ICD-10-CM

## 2020-06-03 NOTE — Progress Notes (Addendum)
Patient here today for BP check.      Last BP was on 05/24/2020 and was 156/107.  BP today is 150/98 with a pulse of 87.    Checked BP in left arm with large cuff.    Symptoms present: None.   Patient does not take BP meds and has not been logging her home readings. Patient advised to monitor over the weekend. Strict ED precautions given to patient for high BP.   Patient scheduled for 06/07/2020 to FU with blood pressure.   Routed note to PCP.

## 2020-06-07 ENCOUNTER — Ambulatory Visit (INDEPENDENT_AMBULATORY_CARE_PROVIDER_SITE_OTHER): Payer: Medicare Other | Admitting: Family Medicine

## 2020-06-07 ENCOUNTER — Other Ambulatory Visit: Payer: Self-pay

## 2020-06-07 VITALS — BP 142/90 | HR 76 | Wt 262.5 lb

## 2020-06-07 DIAGNOSIS — I10 Essential (primary) hypertension: Secondary | ICD-10-CM | POA: Diagnosis not present

## 2020-06-07 MED ORDER — ENALAPRIL MALEATE 5 MG PO TABS: 5.0000 mg | ORAL_TABLET | Freq: Every day | ORAL | 2 refills | Status: DC

## 2020-06-07 NOTE — Assessment & Plan Note (Addendum)
With 3 separate measurements consistent with hypertension, she does not fit a diagnosis of hypertension.  At this time, we will move forward with starting antihypertensive therapy.  Her last blood work was done within the past 6 months.  I am going to forego blood work today in favor of having her return to clinic in 1 week for a BMP.  I discussed initial antihypertensive medication with Dr. Pollie Meyer.  Thiazides do have a tendency to decrease breastmilk production so we will be starting an ACE inhibitor which is appropriate in the setting of breast-feeding and may have the added benefit of providing renal protection if she does develop diabetes in the future. -Start enalapril 5 mg daily -Return lab visit in 1 week, follow-up BMP -Return to clinic for a visit in the next 2-4 weeks to recheck blood pressure and consider medication titration

## 2020-06-07 NOTE — Patient Instructions (Signed)
It was great to see you today.  Here is a quick review of the things we talked about:  Hypertension: Your recent blood pressure readings do give you a diagnosis of hypertension.  We will start a new medication today.  If decided to start a medication called enalapril.  Choses medication because it looks like it will not interfere with breast-feeding.  I would like you to return to clinic in the next 1-2 weeks just for a lab visit.  Please come back for a clinic visit in 2-4 weeks to check your blood pressure and see if we should increase your dose.   If all of your labs are normal, I will send you a message over my chart or send you a letter.  If there is anything to discuss, I will give you a phone call.

## 2020-06-07 NOTE — Progress Notes (Signed)
    SUBJECTIVE:   CHIEF COMPLAINT / HPI:   Hypertension Was last seen in clinic on 5/10 at which time she was noted to have an elevated blood pressure with systolics over 152/79.  Since that visit, she has had a nurse follow-up visit at which time she continued to demonstrate elevated systolic pressures at 150/98.  She presents to clinic today for blood pressure follow-up to see if medications are necessary.  Overall, she feels well on does not note any significant changes in her life in the past month (roughly the time period in which we began to notice elevated blood pressures).  She has 4 children and has previously had a tubal ligation.  Her youngest child is still breast-feeding.  PERTINENT  PMH / PSH: History of gestational diabetes  OBJECTIVE:   BP (!) 142/90   Pulse 76   Wt 262 lb 8 oz (119.1 kg)   LMP 05/24/2020   SpO2 96%   BMI 42.37 kg/m    General: Alert and cooperative and appears to be in no acute distress Cardio: Normal S1 and S2, no S3 or S4. Rhythm is regular. No murmurs or rubs.   Pulm: Clear to auscultation bilaterally, no crackles, wheezing, or diminished breath sounds. Normal respiratory effort Abdomen: Bowel sounds normal. Abdomen soft and non-tender.  Extremities: No peripheral edema. Warm/ well perfused.  Strong radial pulses. Neuro: Cranial nerves grossly intact  ASSESSMENT/PLAN:   Hypertension With 3 separate measurements consistent with hypertension, she does not fit a diagnosis of hypertension.  At this time, we will move forward with starting antihypertensive therapy.  Her last blood work was done within the past 6 months.  I am going to forego blood work today in favor of having her return to clinic in 1 week for a BMP.  I discussed initial antihypertensive medication with Dr. Pollie Meyer.  Thiazides do have a tendency to decrease breastmilk production so we will be starting an ACE inhibitor which is appropriate in the setting of breast-feeding and may have  the added benefit of providing renal protection if she does develop diabetes in the future. -Start enalapril 5 mg daily -Return lab visit in 1 week, follow-up BMP -Return to clinic for a visit in the next 2-4 weeks to recheck blood pressure and consider medication titration     Mirian Mo, MD [facility]

## 2020-06-15 ENCOUNTER — Other Ambulatory Visit: Payer: Medicare Other

## 2020-06-15 ENCOUNTER — Other Ambulatory Visit: Payer: Self-pay

## 2020-06-15 DIAGNOSIS — I10 Essential (primary) hypertension: Secondary | ICD-10-CM | POA: Diagnosis not present

## 2020-06-16 LAB — BASIC METABOLIC PANEL
BUN/Creatinine Ratio: 18 (ref 9–23)
BUN: 14 mg/dL (ref 6–20)
CO2: 19 mmol/L — ABNORMAL LOW (ref 20–29)
Calcium: 9.4 mg/dL (ref 8.7–10.2)
Chloride: 103 mmol/L (ref 96–106)
Creatinine, Ser: 0.79 mg/dL (ref 0.57–1.00)
Glucose: 92 mg/dL (ref 65–99)
Potassium: 4.3 mmol/L (ref 3.5–5.2)
Sodium: 140 mmol/L (ref 134–144)
eGFR: 98 mL/min/{1.73_m2} (ref >59)

## 2020-06-17 ENCOUNTER — Ambulatory Visit (HOSPITAL_COMMUNITY)
Admission: RE | Admit: 2020-06-17 | Discharge: 2020-06-17 | Disposition: A | Discharge status: Home / Self Care | Payer: Medicare Other | Source: Ambulatory Visit | Attending: Family Medicine | Admitting: Family Medicine

## 2020-06-17 ENCOUNTER — Other Ambulatory Visit: Payer: Self-pay

## 2020-06-17 DIAGNOSIS — M5441 Lumbago with sciatica, right side: Secondary | ICD-10-CM | POA: Diagnosis not present

## 2020-06-17 DIAGNOSIS — M545 Low back pain, unspecified: Secondary | ICD-10-CM | POA: Diagnosis not present

## 2020-06-17 DIAGNOSIS — M5442 Lumbago with sciatica, left side: Secondary | ICD-10-CM | POA: Insufficient documentation

## 2020-06-24 ENCOUNTER — Encounter: Payer: Self-pay | Admitting: Family Medicine

## 2020-07-14 ENCOUNTER — Encounter: Payer: Self-pay | Admitting: Family Medicine

## 2020-07-14 ENCOUNTER — Other Ambulatory Visit: Payer: Self-pay

## 2020-07-14 ENCOUNTER — Ambulatory Visit (INDEPENDENT_AMBULATORY_CARE_PROVIDER_SITE_OTHER): Payer: Medicare Other | Admitting: Family Medicine

## 2020-07-14 VITALS — BP 155/92 | HR 66 | Wt 262.0 lb

## 2020-07-14 DIAGNOSIS — M545 Low back pain, unspecified: Secondary | ICD-10-CM

## 2020-07-14 DIAGNOSIS — M2141 Flat foot [pes planus] (acquired), right foot: Secondary | ICD-10-CM

## 2020-07-14 DIAGNOSIS — M2142 Flat foot [pes planus] (acquired), left foot: Secondary | ICD-10-CM

## 2020-07-14 DIAGNOSIS — G8929 Other chronic pain: Secondary | ICD-10-CM

## 2020-07-14 DIAGNOSIS — I10 Essential (primary) hypertension: Secondary | ICD-10-CM

## 2020-07-14 MED ORDER — CETIRIZINE HCL 10 MG PO TABS: 10.0000 mg | ORAL_TABLET | Freq: Every day | ORAL | 3 refills | Status: AC

## 2020-07-14 NOTE — Progress Notes (Signed)
  Date of Visit: 07/14/2020   SUBJECTIVE:   HPI:  [name] presents today for follow up of hypertension.  Hypertension - was recently started on enalapril 5mg  daily but has not taken this in about a week. She just forgets to take it. Did tolerate it fine when she was taking it regularly.  Seasonal allergies - needs refill of zyrtec  Back pain follow up - had MRI done, here also to discuss results. She is agreeable to referral to neurosurgery to discuss treatment options.  Flat feet - desires referral to podiatry to consider shoe inserts  OBJECTIVE:   BP (!) 155/92   Pulse 66   Wt 262 lb (118.8 kg)   SpO2 96%   BMI 42.29 kg/m  Gen: no acute distress, pleasnat cooperative HEENT: normocephalic, atraumatic  Lungs: normal work of breathing  Neuro: alert speech normal, grossly nonfocal  ASSESSMENT/PLAN:    Hypertension Blood pressure elevated but has not taken medication Discussed strategies to remember taking medication Follow up in 1 week after more adherence to check BP at RN visit  Chronic low back pain Chronic back pain, MRi showing varying degrees of arthritis. Discussed option of referral to back surgeon and patient is agreeable. Referral placed  Seasonal allergies Refill zyrtec  Pes planus Refer to podiatry  FOLLOW UP: Follow up in 1 wk for RN blood pressure check  J. [place], MD [facility]

## 2020-07-14 NOTE — Patient Instructions (Signed)
Referring to foot doctor and back doctor  Take enalapril every night - keep it by your toothbrush Schedule nurse blood pressure check in 1 week after you've taken it consistently  Refilled zyrtec  Be well, Dr. Pollie Meyer

## 2020-07-17 NOTE — Assessment & Plan Note (Signed)
Blood pressure elevated but has not taken medication Discussed strategies to remember taking medication Follow up in 1 week after more adherence to check BP at RN visit

## 2020-07-17 NOTE — Assessment & Plan Note (Signed)
Chronic back pain, MRi showing varying degrees of arthritis. Discussed option of referral to back surgeon and patient is agreeable. Referral placed

## 2020-07-26 ENCOUNTER — Other Ambulatory Visit: Payer: Self-pay

## 2020-07-26 ENCOUNTER — Ambulatory Visit (INDEPENDENT_AMBULATORY_CARE_PROVIDER_SITE_OTHER): Payer: Medicare Other | Admitting: Podiatry

## 2020-07-26 ENCOUNTER — Ambulatory Visit (INDEPENDENT_AMBULATORY_CARE_PROVIDER_SITE_OTHER): Payer: Medicare Other

## 2020-07-26 ENCOUNTER — Encounter: Payer: Self-pay | Admitting: Podiatry

## 2020-07-26 DIAGNOSIS — M2142 Flat foot [pes planus] (acquired), left foot: Secondary | ICD-10-CM

## 2020-07-26 DIAGNOSIS — M62461 Contracture of muscle, right lower leg: Secondary | ICD-10-CM

## 2020-07-26 DIAGNOSIS — M62462 Contracture of muscle, left lower leg: Secondary | ICD-10-CM

## 2020-07-26 DIAGNOSIS — M76821 Posterior tibial tendinitis, right leg: Secondary | ICD-10-CM

## 2020-07-26 DIAGNOSIS — M2141 Flat foot [pes planus] (acquired), right foot: Secondary | ICD-10-CM

## 2020-07-26 DIAGNOSIS — M21862 Other specified acquired deformities of left lower leg: Secondary | ICD-10-CM

## 2020-07-26 DIAGNOSIS — M216X2 Other acquired deformities of left foot: Secondary | ICD-10-CM | POA: Diagnosis not present

## 2020-07-26 DIAGNOSIS — M216X1 Other acquired deformities of right foot: Secondary | ICD-10-CM | POA: Diagnosis not present

## 2020-07-26 DIAGNOSIS — M76822 Posterior tibial tendinitis, left leg: Secondary | ICD-10-CM

## 2020-07-26 DIAGNOSIS — M21861 Other specified acquired deformities of right lower leg: Secondary | ICD-10-CM

## 2020-07-26 NOTE — Patient Instructions (Signed)
Posterior Tibial Tendinitis  Posterior tibial tendinitis is irritation of a tendon called the posterior tibial tendon. Your posterior tibial tendon is a cord-like tissue that connects bones of your lower leg and foot to a muscle that: Supports your arch. Helps you raise up on your toes. Helps you turn your foot down and in. This condition causes foot and ankle pain. It can also lead to a flat foot. What are the causes? This condition is most often caused by repeated stress to the tendon (overuse injury). It can also be caused by a sudden injury that stresses the tendon, such as landing on your foot after jumping or falling. What increases the risk? This condition is more likely to develop in: People who play a sport that involves putting a lot of pressure on the feet, such as: Basketball. Tennis. Soccer. Hockey. Runners. Females who are older than 40 years of age and are overweight. People with diabetes. People with decreased foot stability. People with flat feet. What are the signs or symptoms? Symptoms include: Pain in the inner ankle. Pain at the arch of your foot. Pain that gets worse with running, walking, or standing. Swelling on the inside of your ankle and foot. Weakness in your ankle or foot. Inability to stand up on tiptoe. Flattening of the arch of your foot. How is this diagnosed? This condition may be diagnosed based on: Your symptoms. Your medical history. A physical exam. Tests, such as: X-ray. MRI. Ultrasound. How is this treated? This condition may be treated by: Putting ice to the injured area. Taking NSAIDs, such as ibuprofen, to reduce pain and swelling. Wearing a special shoe or shoe insert to support your arch (orthotic). Having physical therapy. Replacing high-impact exercise with low-impact exercise, such as swimming or cycling. If your symptoms do not improve with these treatments, you may need to wear a splint, removable walking boot, or short  leg cast for 6-8 weeks to keep your foot and ankle still (immobilized). Follow these instructions at home: If you have a cast, splint, or boot: Keep it clean and dry. Check the skin around it every day. Tell your health care provider about any concerns. If you have a cast: Do not stick anything inside it to scratch your skin. Doing that increases your risk of infection. You may put lotion on dry skin around the edges of the cast. Do not put lotion on the skin underneath the cast. If you have a splint or boot: Wear it as told by your health care provider. Remove it only as told by your health care provider. Loosen it if your toes tingle, become numb, or turn cold and blue. Bathing Do not take baths, swim, or use a hot tub until your health care provider approves. Ask your health care provider if you may take showers. If your cast, splint, or boot is not waterproof: Do not let it get wet. Cover it with a waterproof covering while you take a bath or a shower. Managing pain and swelling   If directed, put ice on the injured area. If you have a removable splint or boot, remove it as told by your health care provider. Put ice in a plastic bag. Place a towel between your skin and the bag or between your cast and the bag. Leave the ice on for 20 minutes, 2-3 times a day. Move your toes often to reduce stiffness and swelling. Raise (elevate) the injured area above the level of your heart while you are sitting   or lying down. Activity Do not use the injured foot to support your body weight until your health care provider says that you can. Use crutches as told by your health care provider. Do not do activities that make pain or swelling worse. Ask your health care provider when it is safe to drive if you have a cast, splint, or boot on your foot. Return to your normal activities as told by your health care provider. Ask your health care provider what activities are safe for you. Do exercises as  told by your health care provider. General instructions Take over-the-counter and prescription medicines only as told by your health care provider. If you have an orthotic, use it as told by your health care provider. Keep all follow-up visits as told by your health care provider. This is important. How is this prevented? Wear footwear that is appropriate to your athletic activity. Avoid athletic activities that cause pain or swelling in your ankle or foot. Before being active, do range-of-motion and stretching exercises. If you develop pain or swelling while training, stop training. If you have pain or swelling that does not improve after a few days of rest, see your health care provider. If you start a new athletic activity, start gradually so you can build up your strength and flexibility. Contact a health care provider if: Your symptoms get worse. Your symptoms do not improve in 6-8 weeks. You develop new, unexplained symptoms. Your splint, boot, or cast gets damaged. Summary Posterior tibial tendinitis is irritation of a tendon called the posterior tibial tendon. This condition is most often caused by repeated stress to the tendon (overuse injury). This condition causes foot pain and ankle pain. It can also lead to a flat foot. This condition may be treated by not doing high-impact activities, applying ice, having physical therapy, wearing orthotics, and wearing a cast, splint, or boot if needed. This information is not intended to replace advice given to you by your health care provider. Make sure you discuss any questions you have with your health care provider. Document Revised: 04/29/2018 Document Reviewed: 03/06/2018 Elsevier Patient Education  2020 [employer]  Posterior Tibial Tendinitis Rehab Ask your health care provider which exercises are safe for you. Do exercises exactly as told by your health care provider and adjust them as directed. It is normal to feel mild  stretching, pulling, tightness, or discomfort as you do these exercises. Stop right away if you feel sudden pain or your pain gets worse. Do not begin these exercises until told by your health care provider. Stretching and range-of-motion exercises These exercises warm up your muscles and joints and improve the movement and flexibility in your ankle and foot. These exercises may also help to relieve pain. Standing wall calf stretch, knee straight   Stand with your hands against a wall. Extend your left / right leg behind you, and bend your front knee slightly. If directed, place a folded washcloth under the arch of your foot for support. Point the toes of your back foot slightly inward. Keeping your heels on the floor and your back knee straight, shift your weight toward the wall. Do not allow your back to arch. You should feel a gentle stretch in your upper left / right calf. Hold this position for 10 seconds. Repeat 10 times. Complete this exercise 2 times a day. Standing wall calf stretch, knee bent Stand with your hands against a wall. Extend your left / right leg behind you, and bend your front   knee slightly. If directed, place a folded washcloth under the arch of your foot for support. Point the toes of your back foot slightly inward. Unlock your back knee so it is bent. Keep your heels on the floor. You should feel a gentle stretch deep in your lower left / right calf. Hold this position for 10 seconds. Repeat 10 times. Complete this exercise 2 times a day. Strengthening exercises These exercises build strength and endurance in your ankle and foot. Endurance is the ability to use your muscles for a long time, even after they get tired. Ankle inversion with band Secure one end of a rubber exercise band or tubing to a fixed object, such as a table leg or a pole, that will stay still when the band is pulled. Loop the other end of the band around the middle of your left / right foot. Sit  on the floor facing the object with your left / right leg extended. The band or tube should be slightly tense when your foot is relaxed. Leading with your big toe, slowly bring your left / right foot and ankle inward, toward your other foot (inversion). Hold this position for 10 seconds. Slowly return your foot to the starting position. Repeat 10 times. Complete this exercise 2 times a day. Towel curls   Sit in a chair on a non-carpeted surface, and put your feet on the floor. Place a towel in front of your feet. Keeping your heel on the floor, put your left / right foot on the towel. Pull the towel toward you by grabbing the towel with your toes and curling them under. Keep your heel on the floor while you do this. Let your toes relax. Grab the towel with your toes again. Keep going until the towel is completely underneath your foot. Repeat 10 times. Complete this exercise 2 times a day. Balance exercise This exercise improves or maintains your balance. Balance is important in preventing falls. Single leg stand Without wearing shoes, stand near a railing or in a doorway. You may hold on to the railing or door frame as needed for balance. Stand on your left / right foot. Keep your big toe down on the floor and try to keep your arch lifted. If balancing in this position is too easy, try the exercise with your eyes closed or while standing on a pillow. Hold this position for 10 seconds. Repeat 10 times. Complete this exercise 2 times a day. This information is not intended to replace advice given to you by your health care provider. Make sure you discuss any questions you have with your health care provider.  

## 2020-07-26 NOTE — Progress Notes (Signed)
  Subjective:  Patient ID: [name], female    DOB: 1980/03/08,  MRN: [id number]  Chief Complaint  Patient presents with   Nail Problem      np-pes planus bilateral-non req-dr. Felton Clinton refer    40 y.o. female presents with the above complaint. History confirmed with patient.  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  Feet are aching and pain is mostly along the inside of the arch  Objective:  Physical Exam: warm, good capillary refill, no trophic changes or ulcerative lesions, normal DP and PT pulses, and normal sensory exam.  Bilaterally she has pes planus deformity with pain on palpation at the insertion of the navicular with left worse than right along posterior tibial tendon and gastrocnemius equinus, she has full smooth range of motion of the subtalar joint   Radiographs: Multiple views x-ray of both feet: Severe pes planus deformity she has accessory navicular bilateral Assessment:   1. Pes planus of both feet   2. Posterior tibial tendinitis of both lower extremities   3. Gastrocnemius equinus of left lower extremity   4. Gastrocnemius equinus of right lower extremity      Plan:  Patient was evaluated and treated and all questions answered.  Discussed the etiology, pathomechanics and treatment options in detail with the patient.  We also reviewed today's radiographs in detail.  We discussed how pes planus deformity without pain or functional limitation is quite common and often does not require any treatment.  However when pain or functional limitation arises, treatment with nonsurgical therapy is our first line with stretching, physical therapy, and supportive orthoses.  Also discussed that when these treatments fail, often certain patients do well with surgical treatment of these deformities.  Today I recommended that she work on strengthening and stretching exercises for  the posterior tibial tendon as well as supportive flexible orthoses and she will purchase these online.  Return in about 3 months (around 10/26/2020).

## 2020-07-27 DIAGNOSIS — M5416 Radiculopathy, lumbar region: Secondary | ICD-10-CM | POA: Diagnosis not present

## 2020-07-27 DIAGNOSIS — I10 Essential (primary) hypertension: Secondary | ICD-10-CM | POA: Diagnosis not present

## 2020-08-07 ENCOUNTER — Other Ambulatory Visit: Payer: Self-pay | Admitting: Family Medicine

## 2020-08-08 NOTE — Telephone Encounter (Signed)
Please let patient know I am refilling this medication, but she needs to schedule an appointment with me to follow up on her blood pressure and asthma.  Thanks, [name], MD

## 2020-08-09 NOTE — Telephone Encounter (Signed)
Called patient and she is not able to check her calendar at the present time.  Patient will call back later to schedule appointment.  Glennie Hawk, CMA

## 2020-08-12 ENCOUNTER — Other Ambulatory Visit: Payer: Medicare Other

## 2020-08-12 DIAGNOSIS — Z03818 Encounter for observation for suspected exposure to other biological agents ruled out: Secondary | ICD-10-CM | POA: Diagnosis not present

## 2020-08-12 DIAGNOSIS — Z20822 Contact with and (suspected) exposure to covid-19: Secondary | ICD-10-CM | POA: Diagnosis not present

## 2020-08-29 DIAGNOSIS — T464X5A Adverse effect of angiotensin-converting-enzyme inhibitors, initial encounter: Secondary | ICD-10-CM | POA: Diagnosis not present

## 2020-08-29 DIAGNOSIS — R058 Other specified cough: Secondary | ICD-10-CM | POA: Diagnosis not present

## 2020-08-29 DIAGNOSIS — I10 Essential (primary) hypertension: Secondary | ICD-10-CM | POA: Diagnosis not present

## 2020-09-02 ENCOUNTER — Encounter: Payer: Self-pay | Admitting: Internal Medicine

## 2020-09-02 ENCOUNTER — Other Ambulatory Visit: Payer: Self-pay

## 2020-09-02 ENCOUNTER — Ambulatory Visit (INDEPENDENT_AMBULATORY_CARE_PROVIDER_SITE_OTHER): Payer: Medicare Other | Admitting: Internal Medicine

## 2020-09-02 DIAGNOSIS — J45909 Unspecified asthma, uncomplicated: Secondary | ICD-10-CM

## 2020-09-02 MED ORDER — PREDNISONE 10 MG PO TABS: ORAL_TABLET | ORAL | 0 refills | Status: DC

## 2020-09-02 NOTE — Patient Instructions (Addendum)
Plan A = Automatic = Always=   Symbicort 160 Take 2 puffs first thing in am and then another 2 puffs about 12 hours later.   Work on inhaler technique:  relax and gently blow all the way out then take a nice smooth full deep breath back in, triggering the inhaler at same time you start breathing in.  Hold for up to 5 seconds if you can. Blow out thru nose. Rinse and gargle with water when done.  If mouth or throat bother you at all,  try brushing teeth/gums/tongue with arm and hammer toothpaste/ make a slurry and gargle and spit out.    Plan B = Backup (to supplement plan A, not to replace it) Only use your albuterol inhaler as a rescue medication to be used if you can't catch your breath by resting or doing a relaxed purse lip breathing pattern.  - The less you use it, the better it will work when you need it. - Ok to use the inhaler up to 2 puffs  every 4 hours if you must but call for appointment if use goes up over your usual need - Don't leave home without it !!  (think of it like the spare tire for your car)     Prednisone 10 mg take  4 each am x 2 days,   2 each am x 2 days,  1 each am x 2 days and stop   Please schedule a follow up visit in 3 months but call sooner if needed or go to community [facility] center (ask for Dr Delford Field)

## 2020-09-02 NOTE — Progress Notes (Signed)
Subjective:   Patient ID: [name], female    DOB: 08-19-1980,   MRN: [id number]    Brief patient profile:  40  yobf with asthma since age 40 on maint rx ever since then quit smoking 2004 and no better intially seen in pulmonary clinic by Dr Sung Amabile early 2000s followed at [facility] fm practice/ ER with dtca asthma she attributed to allergies self referred to Pulmonary clinic 04/13/2015 p last ov:   02/22/2011 recs Pepcid 20 mg one at bedtime GERD diet / lifestyle    Plan A is to use the symbicort 160 Take 2 puffs first thing in am and then another 2 puffs about 12 hours later and the singulair at night If you can't catch your breath, go to Plan B= ok to use ventolin hfa if needed up to every 4 hours with the goal of less than twice weekly If you still catch your breath Plan C = use the nebulizer up to 4 hours and call us right away for appt to see me or Tammy Nurse Practioner. F/u 4 weeks > no show     04/13/2015 1st [facility] office visit/ Krista Godsil  maint rx symbicort 160 2 bid Chief Complaint  Patient presents with   Pulmonary Consult    Self referral Asthma- former pt, last seen 2013  while on maint on symbicort 160 2bid but two er trips both p exp to dogs while living at [employer] house but getting ready to move.  symbicort is 90 dollars/ proair is 45 and uses maybe one every 6 m and used 3 x then to ER  Off symb x 3 days  Overt HB on bcps rec Plan A = Automatic = Symbicort 160 Take 2 puffs first thing in am and then another 2 puffs about 12 hours later                                     Singulair 10 mg each day  Plan B = Backup Only use your albuterol as a rescue medication  Work on maintaining perfect inhaler technique:   Continue pepcid 20 mg at bedtime and as needed during the day GERD (REFLUX)  is an extremely common cause of respiratory symptoms just like yours , many times with no obvious heartburn at all.  Discuss with your gyn alternatives to your birth control  pills which promote more reflux     06/22/2015  f/u ov/Kaylla Cobos re: preop asthma eval for breast reduciton   Chief Complaint  Patient presents with   Follow-up    Pt states had to go to ED 06/14/15 with asthma flare. Breathing is now back at her normal baseline. She uses albuterol inhaler 1 to 2 x per wk on average.   off med x 2 weeks before attack p exp to smoke from grill > Er as above  Back on symb 2 bid and singulair now Able to sleep ok on 2 pillows s noct resp c/o's rec  continue symb 160 2bid    07/01/17  NP eval for flare off hs h2  Continue your Symbicort >>> 2 puffs in the morning right when you wake up, rinse your mouth out, 12 hours later 2 more puffs, rinse her mouth out after use >>> Continue this daily no matter what Let us restart a daily antihistamine >>> You can do Claritin/loratadine 10 mg daily Fluticasone nasal spray >>> 2  sprays each nostril daily as needed for nasal congestion or allergy symptoms Restart Pepcid 20 mg >>> Take at night  >>> Reviewed literature and discharge paperwork below about appropriate diet for acid reflux management >>> Continue working towards a healthy weight Lab work today >>> TSH and CBC >>> Complete this in the basement of our office today  when leaving this appointment >>>if these are irregular then primary care can manage     10/01/2017  f/u ov/Tim Corriher re: asthma maint on symb 160/ singulair and noct h2  Chief Complaint  Patient presents with   Follow-up    Breathing has returned to baseline. She rarely uses her albuterol inhaler.  She c/o swelling in her left ankle and both hands for the past few wks.    Dyspnea:  MMRC1 = can walk nl pace, flat grade, can't hurry or go uphills or steps s sob   Cough: none Sleeping: flat / 1-2 pillows  SABA use: rare 02: none   rec Not change medications for now Please remember to go to the lab department downstairs in the basement  for your tests - we will call you with the results when they are  available.   03/03/2018  f/u ov/Gloriann Riede re:  Asthma maint on symb 160 / singulair  Chief Complaint  Patient presents with   Follow-up    sob resolved p predniosone by pcp,   has not not needed neb recently.   Dyspnea:  Housework / flat walking ok , steps are still a problem Cough: none now  Sleeping: on 30 degrees electric bed SABA use: as above, was not using any albuterol until a few weeks ago then flare ? 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   Sleeping as above without nocturnal  or early am exacerbation  of respiratory  c/o's or need for noct saba. Also denies any obvious fluctuation of symptoms with weather or environmental changes or other aggravating or alleviating factors except as outlined above   No unusual exposure hx or h/o childhood pna/ asthma or knowledge of premature birth.  Current Allergies, Complete Past Medical History, Past Surgical History, Family History, and Social History were reviewed in [employer] record.  ROS  The following are not active  complaints unless bolded Hoarseness, sore throat, dysphagia, dental problems, itching, sneezing,  nasal congestion or discharge of excess mucus or purulent secretions, ear ache,   fever, chills,  sweats, unintended wt loss or wt gain, classically pleuritic or exertional cp,  orthopnea pnd or arm/hand swelling  or leg swelling, presyncope, palpitations, abdominal pain, anorexia, nausea, vomiting, diarrhea  or change in bowel habits or change in bladder habits, change in stools or change in urine, dysuria, hematuria,  rash, arthralgias, visual complaints, headache, numbness, weakness or ataxia or problems with walking or coordination,  change in mood or  memory.        Current Meds -  - NOTE:   Unable to verify as accurately reflecting what pt takes    Medication Sig   albuterol (PROVENTIL) (2.5 MG/3ML) 0.083% nebulizer solution Take 3 mLs (2.5 mg total) by nebulization every 6 (six) hours as needed for wheezing or shortness of breath.   budesonide-formoterol (SYMBICORT) 160-4.5 MCG/ACT inhaler Inhale 2 puffs into the lungs 2 (two) times daily.   cetirizine (ZYRTEC) 10 MG tablet Take 1 tablet (10 mg total) by mouth daily.   diclofenac Sodium (VOLTAREN) 1 % GEL Apply 2 g topically 4 (four) times daily.   hydrochlorothiazide (HYDRODIURIL) 12.5 MG tablet Take 12.5 mg by mouth daily.   montelukast (SINGULAIR) 10 MG tablet TAKE 1 TABLET BY MOUTH AT BEDTIME   Prenatal Vit-Fe Fumarate-FA (PRENATAL MULTIVITAMIN) TABS tablet Take 1 tablet by mouth daily at 12 noon.   PROAIR HFA 108 (90 Base) MCG/ACT inhaler INHALE 2 PUFFS BY MOUTH EVERY 4 HOURS AS NEEDED FOR WHEEZING AND FOR SHORTNESS OF BREATH               Objective:   Physical Exam    09/02/2020       260 03/03/2018       274  10/01/2017       260  06/22/2015          245  04/13/15 243 lb 6.4 oz (110.406 kg)  04/03/15 239 lb (108.41 kg)  03/14/15 242 lb 1.6 oz (109.816 kg)       Vital signs reviewed  09/02/2020  - Note at rest 02 sats  97% on RA    General appearance:    obese amb bf nad   HEENT : pt wearing mask not removed for exam due to covid -19 concerns.    NECK :  without JVD/Nodes/TM/ nl carotid upstrokes bilaterally   LUNGS: no acc muscle use,  Nl contour chest with distant mid exp wheezes  bilaterally without cough on insp or exp maneuvers   CV:  RRR  no s3 or murmur or increase in P2, and no edema   ABD: Obese soft and nontender with nl inspiratory excursion in the supine position. No bruits or organomegaly appreciated, bowel sounds nl  MS:  Nl gait/ ext warm without deformities, calf tenderness, cyanosis or clubbing No obvious joint restrictions   SKIN: warm and dry without lesions    NEURO:  alert, approp, nl sensorium with  no motor or cerebellar deficits apparent.                Assessment & Plan:

## 2020-09-02 NOTE — Assessment & Plan Note (Signed)
Poorly controlled asthma likely due to non adherence  - The proper method of use, as well as anticipated side effects, of a metered-dose inhaler were discussed and demonstrated to the patient using teach back method.   Continue sym,b 160 2bid and prn saba Re saba: I spent extra time with pt today reviewing appropriate use of albuterol for prn use on exertion with the following points: 1) saba is for relief of sob that does not improve by walking a slower pace or resting but rather if the pt does not improve after trying this first. 2) If the pt is convinced, as many are, that saba helps recover from activity faster then it's easy to tell if this is the case by re-challenging : ie stop, take the inhaler, then p 5 minutes try the exact same activity (intensity of workload) that just caused the symptoms and see if they are substantially diminished or not after saba 3) if there is an activity that reproducibly causes the symptoms, try the saba 15 min before the activity on alternate days   If in fact the saba really does help, then fine to continue to use it prn but advised may need to look closer at the maintenance regimen being used to achieve better control of airways disease with exertion.   Prednisone 10 mg take  4 each am x 2 days,   2 each am x 2 days,  1 each am x 2 days and stop   If can't afford meds will need to go to [facility] center/ Dr Delford Field as we have no asthma samples here.   If keeps benefits/medication  access ok to return here q 3 m         Each maintenance medication was reviewed in detail including emphasizing most importantly the difference between maintenance and prns and under what circumstances the prns are to be triggered using an action plan format where appropriate.  Total time for H and P, chart review, counseling, reviewing hfa device(s) and generating customized AVS unique to this office visit to re-establish for pt not seen > 2 y  / same day charting > 30  min

## 2020-10-12 DIAGNOSIS — L811 Chloasma: Secondary | ICD-10-CM | POA: Diagnosis not present

## 2020-10-12 DIAGNOSIS — L810 Postinflammatory hyperpigmentation: Secondary | ICD-10-CM | POA: Diagnosis not present

## 2020-10-12 DIAGNOSIS — L814 Other melanin hyperpigmentation: Secondary | ICD-10-CM | POA: Diagnosis not present

## 2020-10-27 ENCOUNTER — Ambulatory Visit: Payer: Medicare Other | Admitting: Podiatry

## 2020-11-07 ENCOUNTER — Ambulatory Visit (INDEPENDENT_AMBULATORY_CARE_PROVIDER_SITE_OTHER): Payer: Medicare Other | Admitting: Podiatry

## 2020-11-07 ENCOUNTER — Other Ambulatory Visit: Payer: Self-pay

## 2020-11-07 DIAGNOSIS — M2141 Flat foot [pes planus] (acquired), right foot: Secondary | ICD-10-CM

## 2020-11-07 DIAGNOSIS — M76821 Posterior tibial tendinitis, right leg: Secondary | ICD-10-CM | POA: Diagnosis not present

## 2020-11-07 DIAGNOSIS — M21861 Other specified acquired deformities of right lower leg: Secondary | ICD-10-CM | POA: Diagnosis not present

## 2020-11-07 DIAGNOSIS — M21862 Other specified acquired deformities of left lower leg: Secondary | ICD-10-CM | POA: Diagnosis not present

## 2020-11-07 DIAGNOSIS — M62462 Contracture of muscle, left lower leg: Secondary | ICD-10-CM

## 2020-11-07 DIAGNOSIS — M62461 Contracture of muscle, right lower leg: Secondary | ICD-10-CM

## 2020-11-07 DIAGNOSIS — M76822 Posterior tibial tendinitis, left leg: Secondary | ICD-10-CM | POA: Diagnosis not present

## 2020-11-07 DIAGNOSIS — M2142 Flat foot [pes planus] (acquired), left foot: Secondary | ICD-10-CM

## 2020-11-07 NOTE — Progress Notes (Signed)
  Subjective:  Patient ID: [name], female    DOB: 1980-06-29,  MRN: [id number]  Chief Complaint  Patient presents with   Flat Foot    pes planus bilateral, 3 month follow up    40 y.o. female presents with the above complaint. History confirmed with patient.  She is at some improvement with home therapy.  She is interested in getting arch supports at this point  Objective:  Physical Exam: warm, good capillary refill, no trophic changes or ulcerative lesions, normal DP and PT pulses, and normal sensory exam.  Bilaterally she has pes planus deformity with pain on palpation at the insertion of the navicular with left worse than right along posterior tibial tendon and gastrocnemius equinus, she has full smooth range of motion of the subtalar joint   Radiographs: Multiple views x-ray of both feet: Severe pes planus deformity she has accessory navicular bilateral Assessment:   1. Pes planus of both feet   2. Posterior tibial tendinitis of both lower extremities   3. Gastrocnemius equinus of left lower extremity   4. Gastrocnemius equinus of right lower extremity      Plan:  Patient was evaluated and treated and all questions answered.  Today we again discussed the etiology, pathomechanics and treatment options in detail with the patient.  We also reviewed today's radiographs in detail.  We discussed how pes planus deformity without pain or functional limitation is quite common and often does not require any treatment.  However when pain or functional limitation arises, treatment with nonsurgical therapy is our first line with stretching, physical therapy, and supportive orthoses.  Also discussed that when these treatments fail, often certain patients do well with surgical treatment of these deformities.   Continue home physical therapy and dispensed power step foot orthoses today.  She will see how she does with this and return to see me as needed.  We also discussed the option of  custom molded orthoses for the future if these are helpful  Return if symptoms worsen or fail to improve.

## 2020-11-30 ENCOUNTER — Encounter: Payer: Self-pay | Admitting: Internal Medicine

## 2020-11-30 ENCOUNTER — Other Ambulatory Visit: Payer: Self-pay

## 2020-11-30 ENCOUNTER — Ambulatory Visit: Payer: Medicare Other | Admitting: Internal Medicine

## 2020-11-30 ENCOUNTER — Ambulatory Visit (INDEPENDENT_AMBULATORY_CARE_PROVIDER_SITE_OTHER): Payer: Medicare Other | Admitting: Internal Medicine

## 2020-11-30 DIAGNOSIS — J45909 Unspecified asthma, uncomplicated: Secondary | ICD-10-CM | POA: Diagnosis not present

## 2020-11-30 DIAGNOSIS — Z6841 Body Mass Index (BMI) 40.0 and over, adult: Secondary | ICD-10-CM

## 2020-11-30 MED ORDER — BUDESONIDE-FORMOTEROL FUMARATE 80-4.5 MCG/ACT IN AERO: INHALATION_SPRAY | RESPIRATORY_TRACT | 12 refills | Status: DC

## 2020-11-30 NOTE — Assessment & Plan Note (Signed)
Body mass index is 41.97 kg/m.  - no change  Lab Results  Component Value Date   TSH 1.34 07/01/2017      Contributing to doe and risk of GERD >>>   reviewed the need and the process to achieve and maintain neg calorie balance > defer f/u primary care including intermittently monitoring thyroid status            Each maintenance medication was reviewed in detail including emphasizing most importantly the difference between maintenance and prns and under what circumstances the prns are to be triggered using an action plan format where appropriate.  Total time for H and P, chart review, counseling, reviewing hfa device(s) and generating customized AVS unique to this office visit / same day charting = 21 min

## 2020-11-30 NOTE — Assessment & Plan Note (Addendum)
Onset in childhood  - 11/30/2020  After extensive coaching inhaler device,  effectiveness =    75% > continue symb 160 2bid  All goals of chronic asthma control met including optimal function and elimination of symptoms with minimal need for rescue therapy.  Contingencies discussed in full including contacting this office immediately if not controlling the symptoms using the rule of two's.     >>> f/u q 3 m for now to assure adherence.   

## 2020-11-30 NOTE — Progress Notes (Deleted)
Subjective:   Patient ID: [name], female    DOB: 08-19-1980,   MRN: [id number]    Brief patient profile:  40  yobf with asthma since age 40 on maint rx ever since then quit smoking 2004 and no better intially seen in pulmonary clinic by Dr Sung Amabile early 2000s followed at [facility] fm practice/ ER with dtca asthma she attributed to allergies self referred to Pulmonary clinic 04/13/2015 p last ov:   02/22/2011 recs Pepcid 20 mg one at bedtime GERD diet / lifestyle    Plan A is to use the symbicort 160 Take 2 puffs first thing in am and then another 2 puffs about 12 hours later and the singulair at night If you can't catch your breath, go to Plan B= ok to use ventolin hfa if needed up to every 4 hours with the goal of less than twice weekly If you still catch your breath Plan C = use the nebulizer up to 4 hours and call us right away for appt to see me or Tammy Nurse Practioner. F/u 4 weeks > no show     04/13/2015 1st [facility] office visit/ Layman Gully  maint rx symbicort 160 2 bid Chief Complaint  Patient presents with   Pulmonary Consult    Self referral Asthma- former pt, last seen 2013  while on maint on symbicort 160 2bid but two er trips both p exp to dogs while living at [employer] house but getting ready to move.  symbicort is 90 dollars/ proair is 45 and uses maybe one every 6 m and used 3 x then to ER  Off symb x 3 days  Overt HB on bcps rec Plan A = Automatic = Symbicort 160 Take 2 puffs first thing in am and then another 2 puffs about 12 hours later                                     Singulair 10 mg each day  Plan B = Backup Only use your albuterol as a rescue medication  Work on maintaining perfect inhaler technique:   Continue pepcid 20 mg at bedtime and as needed during the day GERD (REFLUX)  is an extremely common cause of respiratory symptoms just like yours , many times with no obvious heartburn at all.  Discuss with your gyn alternatives to your birth control  pills which promote more reflux     06/22/2015  f/u ov/Cheyla Duchemin re: preop asthma eval for breast reduciton   Chief Complaint  Patient presents with   Follow-up    Pt states had to go to ED 06/14/15 with asthma flare. Breathing is now back at her normal baseline. She uses albuterol inhaler 1 to 2 x per wk on average.   off med x 2 weeks before attack p exp to smoke from grill > Er as above  Back on symb 2 bid and singulair now Able to sleep ok on 2 pillows s noct resp c/o's rec  continue symb 160 2bid    07/01/17  NP eval for flare off hs h2  Continue your Symbicort >>> 2 puffs in the morning right when you wake up, rinse your mouth out, 12 hours later 2 more puffs, rinse her mouth out after use >>> Continue this daily no matter what Let us restart a daily antihistamine >>> You can do Claritin/loratadine 10 mg daily Fluticasone nasal spray >>> 2  sprays each nostril daily as needed for nasal congestion or allergy symptoms Restart Pepcid 20 mg >>> Take at night  >>> Reviewed literature and discharge paperwork below about appropriate diet for acid reflux management >>> Continue working towards a healthy weight Lab work today >>> TSH and CBC >>> Complete this in the basement of our office today  when leaving this appointment >>>if these are irregular then primary care can manage     10/01/2017  f/u ov/Nettie Wyffels re: asthma maint on symb 160/ singulair and noct h2  Chief Complaint  Patient presents with   Follow-up    Breathing has returned to baseline. She rarely uses her albuterol inhaler.  She c/o swelling in her left ankle and both hands for the past few wks.    Dyspnea:  MMRC1 = can walk nl pace, flat grade, can't hurry or go uphills or steps s sob   Cough: none Sleeping: flat / 1-2 pillows  SABA use: rare 02: none   rec Not change medications for now Please remember to go to the lab department downstairs in the basement  for your tests - we will call you with the results when they are  available.   03/03/2018  f/u ov/Neenah Canter re:  Asthma maint on symb 160 / singulair  Chief Complaint  Patient presents with   Follow-up    sob resolved p predniosone by pcp,   has not not needed neb recently.   Dyspnea:  Housework / flat walking ok , steps are still a problem Cough: none now  Sleeping: on 30 degrees electric bed SABA use: as above, was not using any albuterol until a few weeks ago then flare ? 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  Work on inhaler technique:   Plan B = Backup (to supplement plan A, not to replace it) Only use your albuterol inhaler as a rescue medication Prednisone 10 mg take  4 each am x 2 days,   2 each am x 2 days,  1 each am x 2 days and stop  Please schedule a follow up visit in 3 months but call sooner if needed or go to community wellness center (ask for Dr Delford Field)    .11/30/2020  f/u ov/Kimiyo Carmicheal re: ***   maint on ***  No chief complaint on file.   Dyspnea:  *** Cough: *** Sleeping: *** SABA use: *** 02: *** Covid status:   ***   No obvious day to  day or daytime variability or assoc excess/ purulent  sputum or mucus plugs or hemoptysis or cp or chest tightness, subjective wheeze or overt sinus or hb symptoms.   *** without nocturnal  or early am exacerbation  of respiratory  c/o's or need for noct saba. Also denies any obvious fluctuation of symptoms with weather or environmental changes or other aggravating or alleviating factors except as outlined above   No unusual exposure hx or h/o childhood pna/ asthma or knowledge of premature birth.  Current Allergies, Complete Past Medical History, Past Surgical History, Family History, and Social History were reviewed in [employer] record.  ROS  The following are not active complaints unless bolded Hoarseness, sore throat, dysphagia, dental problems, itching, sneezing,  nasal congestion or discharge of excess mucus or purulent secretions, ear ache,   fever, chills, sweats, unintended wt loss or wt gain, classically pleuritic or exertional cp,  orthopnea pnd or arm/hand swelling  or leg swelling, presyncope, palpitations, abdominal pain, anorexia, nausea, vomiting, diarrhea  or change in bowel habits or change in bladder habits, change in stools or change in urine, dysuria, hematuria,  rash, arthralgias, visual complaints, headache, numbness, weakness or ataxia or problems with walking or coordination,  change in mood or  memory.        No outpatient medications have been marked as taking for the 11/30/20 encounter (Appointment) with Tanda Rockers, MD.               Objective:   Physical Exam   11/30/2020      *** 09/02/2020       260 03/03/2018       274  10/01/2017       260  06/22/2015          245  04/13/15 243 lb 6.4 oz (110.406 kg)  04/03/15 239 lb (108.41 kg)  03/14/15 242 lb 1.6 oz (109.816 kg)      Vital signs reviewed  11/30/2020  - Note at rest 02 sats  ***% on ***   General appearance:    ***                  Assessment & Plan:

## 2020-11-30 NOTE — Patient Instructions (Addendum)
Symbicort 80 Take 2 puffs first thing in am and then another 2 puffs about 12 hours later.     Only use your albuterol as a rescue medication to be used if you can't catch your breath by resting or doing a relaxed purse lip breathing pattern.  - The less you use it, the better it will work when you need it. - Ok to use up to 2 puffs  every 4 hours if you must but call for immediate appointment if use goes up over your usual need - Don't leave home without it !!  (think of it like the spare tire for your car)    Please schedule a follow up visit in 3 months but call sooner if needed

## 2020-11-30 NOTE — Progress Notes (Signed)
Subjective:   Patient ID: [name], female    DOB: 08-19-1980,   MRN: [id number]    Brief patient profile:  40  yobf with asthma since age 40 on maint rx ever since then quit smoking 2004 and no better intially seen in pulmonary clinic by Dr Sung Amabile early 2000s followed at [facility] fm practice/ ER with dtca asthma she attributed to allergies self referred to Pulmonary clinic 04/13/2015 p last ov:   02/22/2011 recs Pepcid 20 mg one at bedtime GERD diet / lifestyle    Plan A is to use the symbicort 160 Take 2 puffs first thing in am and then another 2 puffs about 12 hours later and the singulair at night If you can't catch your breath, go to Plan B= ok to use ventolin hfa if needed up to every 4 hours with the goal of less than twice weekly If you still catch your breath Plan C = use the nebulizer up to 4 hours and call us right away for appt to see me or Tammy Nurse Practioner. F/u 4 weeks > no show     04/13/2015 1st [facility] office visit/ Copelan Maultsby  maint rx symbicort 160 2 bid Chief Complaint  Patient presents with   Pulmonary Consult    Self referral Asthma- former pt, last seen 2013  while on maint on symbicort 160 2bid but two er trips both p exp to dogs while living at [employer] house but getting ready to move.  symbicort is 90 dollars/ proair is 45 and uses maybe one every 6 m and used 3 x then to ER  Off symb x 3 days  Overt HB on bcps rec Plan A = Automatic = Symbicort 160 Take 2 puffs first thing in am and then another 2 puffs about 12 hours later                                     Singulair 10 mg each day  Plan B = Backup Only use your albuterol as a rescue medication  Work on maintaining perfect inhaler technique:   Continue pepcid 20 mg at bedtime and as needed during the day GERD (REFLUX)  is an extremely common cause of respiratory symptoms just like yours , many times with no obvious heartburn at all.  Discuss with your gyn alternatives to your birth control  pills which promote more reflux     06/22/2015  f/u ov/Marquette Piontek re: preop asthma eval for breast reduciton   Chief Complaint  Patient presents with   Follow-up    Pt states had to go to ED 06/14/15 with asthma flare. Breathing is now back at her normal baseline. She uses albuterol inhaler 1 to 2 x per wk on average.   off med x 2 weeks before attack p exp to smoke from grill > Er as above  Back on symb 2 bid and singulair now Able to sleep ok on 2 pillows s noct resp c/o's rec  continue symb 160 2bid    07/01/17  NP eval for flare off hs h2  Continue your Symbicort >>> 2 puffs in the morning right when you wake up, rinse your mouth out, 12 hours later 2 more puffs, rinse her mouth out after use >>> Continue this daily no matter what Let us restart a daily antihistamine >>> You can do Claritin/loratadine 10 mg daily Fluticasone nasal spray >>> 2  sprays each nostril daily as needed for nasal congestion or allergy symptoms Restart Pepcid 20 mg >>> Take at night  >>> Reviewed literature and discharge paperwork below about appropriate diet for acid reflux management >>> Continue working towards a healthy weight Lab work today >>> TSH and CBC >>> Complete this in the basement of our office today  when leaving this appointment >>>if these are irregular then primary care can manage     10/01/2017  f/u ov/Chery Giusto re: asthma maint on symb 160/ singulair and noct h2  Chief Complaint  Patient presents with   Follow-up    Breathing has returned to baseline. She rarely uses her albuterol inhaler.  She c/o swelling in her left ankle and both hands for the past few wks.    Dyspnea:  MMRC1 = can walk nl pace, flat grade, can't hurry or go uphills or steps s sob   Cough: none Sleeping: flat / 1-2 pillows  SABA use: rare 02: none   rec Not change medications for now Please remember to go to the lab department downstairs in the basement  for your tests - we will call you with the results when they are  available.   03/03/2018  f/u ov/Debanhi Blaker re:  Asthma maint on symb 160 / singulair  Chief Complaint  Patient presents with   Follow-up    sob resolved p predniosone by pcp,   has not not needed neb recently.   Dyspnea:  Housework / flat walking ok , steps are still a problem Cough: none now  Sleeping: on 30 degrees electric bed SABA use: as above, was not using any albuterol until a few weeks ago then flare ? 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  Work on inhaler technique:   Plan B = Backup (to supplement plan A, not to replace it) Only use your albuterol inhaler as a rescue medication Prednisone 10 mg take  4 each am x 2 days,   2 each am x 2 days,  1 each am x 2 days and stop  Please schedule a follow up visit in 3 months but call sooner if needed or go to community wellness center (ask for Dr Joya Gaskins)    .11/30/2020  f/u ov/Lyndel Sarate re: asthma maint on symbiocrt 160 2bid   No chief complaint on file.  Dyspnea:  strolling with baby  Cough: none  Sleeping: most the time flat now  SABA use: 1-3 x days 02: none  Covid status:   none    No  obvious day to day or daytime variability or assoc excess/ purulent sputum or mucus plugs or hemoptysis or cp or chest tightness, subjective wheeze or overt sinus or hb symptoms.   Sleeping now without nocturnal  or early am exacerbation  of respiratory  c/o's or need for noct saba. Also denies any obvious fluctuation of symptoms with weather or environmental changes or other aggravating or alleviating factors except as outlined above   No unusual exposure hx or h/o childhood pna  or knowledge of premature birth.  Current Allergies, Complete Past Medical History, Past Surgical History, Family History, and Social History were reviewed in [employer] record.  ROS  The following are not active complaints unless bolded Hoarseness, sore throat, dysphagia, dental problems, itching, sneezing,  nasal congestion or discharge of excess mucus or purulent secretions, ear ache,   fever, chills, sweats, unintended wt loss or wt gain, classically pleuritic or exertional cp,  orthopnea pnd or arm/hand swelling  or leg swelling, presyncope, palpitations, abdominal pain, anorexia, nausea, vomiting, diarrhea  or change in bowel habits or change in bladder habits, change in stools or change in urine, dysuria, hematuria,  rash, arthralgias, visual complaints, headache, numbness, weakness or ataxia or problems with walking or coordination,  change in mood or  memory.        Current Meds  Medication Sig   albuterol (PROVENTIL) (2.5 MG/3ML) 0.083% nebulizer solution Take 3 mLs (2.5 mg total) by nebulization every 6 (six) hours as needed for wheezing or shortness of breath.   budesonide-formoterol (SYMBICORT) 80-4.5 MCG/ACT inhaler Take 2 puffs first thing in am and then another 2 puffs about 12 hours later.   cetirizine (ZYRTEC) 10 MG tablet Take 1 tablet (10 mg total) by mouth daily.   diclofenac Sodium (VOLTAREN) 1 % GEL Apply 2 g topically 4 (four) times daily.   hydrochlorothiazide (HYDRODIURIL)  12.5 MG tablet Take 12.5 mg by mouth daily.   montelukast (SINGULAIR) 10 MG tablet TAKE 1 TABLET BY MOUTH AT BEDTIME   Prenatal Vit-Fe Fumarate-FA (PRENATAL MULTIVITAMIN) TABS tablet Take 1 tablet by mouth daily at 12 noon.   PROAIR HFA 108 (90 Base) MCG/ACT inhaler INHALE 2 PUFFS BY MOUTH EVERY 4 HOURS AS NEEDED FOR WHEEZING AND FOR SHORTNESS OF BREATH               Objective:   Physical Exam   11/30/2020      260 09/02/2020       260 03/03/2018       274  10/01/2017       260  06/22/2015          245  04/13/15 243 lb 6.4 oz (110.406 kg)  04/03/15 239 lb (108.41 kg)  03/14/15 242 lb 1.6 oz (109.816 kg)      Vital signs reviewed  11/30/2020  - Note at rest 02 sats  95% on RA   General appearance:    morbidly obese (by BMI)  amb bf nad   HEENT : pt wearing mask not removed for exam due to covid -19 concerns.    NECK :  without JVD/Nodes/TM/ nl carotid upstrokes bilaterally   LUNGS: no acc muscle use,  Nl contour chest which is clear to A and P bilaterally without cough on insp or exp maneuvers   CV:  RRR  no s3 or murmur or increase in P2, and no edema   ABD:  soft and nontender  with nl inspiratory excursion in the supine position. No bruits or organomegaly appreciated, bowel sounds nl  MS:  Nl gait/ ext warm without deformities, calf tenderness, cyanosis or clubbing No obvious joint restrictions   SKIN: warm and dry without lesions    NEURO:  alert, approp, nl sensorium with  no motor or cerebellar deficits apparent.                   Assessment & Plan:

## 2020-12-05 ENCOUNTER — Ambulatory Visit: Payer: Medicare Other | Admitting: Internal Medicine

## 2020-12-12 ENCOUNTER — Encounter: Payer: Self-pay | Admitting: Internal Medicine

## 2020-12-12 MED ORDER — PREDNISONE 10 MG PO TABS: ORAL_TABLET | ORAL | 0 refills | Status: DC

## 2020-12-12 NOTE — Telephone Encounter (Signed)
Called and spoke with patient who states that when she woke up this morning she was not feeling good. Her chest is tight, productive cough with yellow sputum and headache. She has had to use her rescue inhaler 4-5 hours apart since this morning. Denies fever   Dr. Sherene Sires please advise

## 2020-12-12 NOTE — Telephone Encounter (Signed)
Unless she has had very similar symptoms before like this needs to go to ER   If same with flares in past > Prednisone 10 mg take  4 each am x 2 days,   2 each am x 2 days,  1 each am x 2 days and stop and zpak

## 2020-12-12 NOTE — Telephone Encounter (Signed)
I called and spoke with the pt and notified of response per Dr Sherene Sires  She verbalized understanding  She feels these symptoms are same as what she has had before  Pred sent to [facility]

## 2021-02-06 DIAGNOSIS — I10 Essential (primary) hypertension: Secondary | ICD-10-CM | POA: Diagnosis not present

## 2021-02-13 DIAGNOSIS — I10 Essential (primary) hypertension: Secondary | ICD-10-CM | POA: Diagnosis not present

## 2021-03-13 ENCOUNTER — Ambulatory Visit: Payer: Medicare Other | Admitting: Internal Medicine

## 2021-03-13 NOTE — Progress Notes (Unsigned)
Subjective:   Patient ID: [name], female    DOB: 05-17-1980,   MRN: [id number]    Brief patient profile:  63  yobf with asthma since age 41 on maint rx ever since then quit smoking 2004 and no better intially seen in pulmonary clinic by Dr Alva Garnet early 2000s followed at [facility] fm practice/ ER with dtca asthma she attributed to allergies self referred to Pulmonary clinic 04/13/2015 p last ov:   02/22/2011 recs Pepcid 20 mg one at bedtime GERD diet / lifestyle    Plan A is to use the symbicort 160 Take 2 puffs first thing in am and then another 2 puffs about 12 hours later and the singulair at night If you can't catch your breath, go to Plan B= ok to use ventolin hfa if needed up to every 4 hours with the goal of less than twice weekly If you still catch your breath Plan C = use the nebulizer up to 4 hours and call us right away for appt to see me or [place]. F/u 4 weeks > no show     04/13/2015 1st [place] [facility] office visit/ [name]  maint rx symbicort 160 2 bid Chief Complaint  Patient presents with   Pulmonary Consult    Self referral Asthma- former pt, last seen 2013  while on maint on symbicort 160 2bid but two er trips both p exp to dogs while living at [employer] house but getting ready to move.  symbicort is 90 dollars/ proair is 45 and uses maybe one every 6 m and used 3 x then to ER  Off symb x 3 days  Overt HB on bcps rec Plan A = Automatic = Symbicort 160 Take 2 puffs first thing in am and then another 2 puffs about 12 hours later                                     Singulair 10 mg each day  Plan B = Backup Only use your albuterol as a rescue medication  Work on maintaining perfect inhaler technique:   Continue pepcid 20 mg at bedtime and as needed during the day GERD (REFLUX)  is an extremely common cause of respiratory symptoms just like yours , many times with no obvious heartburn at all.  Discuss with your gyn alternatives to your birth control  pills which promote more reflux     06/22/2015  f/u ov/[name] re: preop asthma eval for breast reduciton   Chief Complaint  Patient presents with   Follow-up    Pt states had to go to ED 06/14/15 with asthma flare. Breathing is now back at her normal baseline. She uses albuterol inhaler 1 to 2 x per wk on average.   off med x 2 weeks before attack p exp to smoke from grill > Er as above  Back on symb 2 bid and singulair now Able to sleep ok on 2 pillows s noct resp c/o's rec  continue symb 160 2bid    07/01/17  NP eval for flare off hs h2  Continue your Symbicort >>> 2 puffs in the morning right when you wake up, rinse your mouth out, 12 hours later 2 more puffs, rinse her mouth out after use >>> Continue this daily no matter what Let us restart a daily antihistamine >>> You can do Claritin/loratadine 10 mg daily Fluticasone nasal spray >>> 2  sprays each nostril daily as needed for nasal congestion or allergy symptoms Restart Pepcid 20 mg >>> Take at night  >>> Reviewed literature and discharge paperwork below about appropriate diet for acid reflux management >>> Continue working towards a healthy weight Lab work today >>> TSH and CBC >>> Complete this in the basement of our office today  when leaving this appointment >>>if these are irregular then primary care can manage     10/01/2017  f/u ov/[name] re: asthma maint on symb 160/ singulair and noct h2  Chief Complaint  Patient presents with   Follow-up    Breathing has returned to baseline. She rarely uses her albuterol inhaler.  She c/o swelling in her left ankle and both hands for the past few wks.    Dyspnea:  MMRC1 = can walk nl pace, flat grade, can't hurry or go uphills or steps s sob   Cough: none Sleeping: flat / 1-2 pillows  SABA use: rare 02: none   rec Not change medications for now Please remember to go to the lab department downstairs in the basement  for your tests - we will call you with the results when they are  available.   03/03/2018  f/u ov/[name] re:  Asthma maint on symb 160 / singulair  Chief Complaint  Patient presents with   Follow-up    sob resolved p predniosone by pcp,   has not not needed neb recently.   Dyspnea:  Housework / flat walking ok , steps are still a problem Cough: none now  Sleeping: on 30 degrees electric bed SABA use: as above, was not using any albuterol until a few weeks ago then flare ? 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  Work on inhaler technique:   Plan B = Backup (to supplement plan A, not to replace it) Only use your albuterol inhaler as a rescue medication Prednisone 10 mg take  4 each am x 2 days,   2 each am x 2 days,  1 each am x 2 days and stop  Please schedule a follow up visit in 3 months but call sooner if needed or go to community wellness center (ask for Dr [name])    11/30/2020  f/u ov/[name] re: asthma maint on symbiocrt 160 2bid   No chief complaint on file.  Dyspnea:  strolling with baby  Cough: none  Sleeping: most the time flat now  SABA use: 1-3 x days 02: none  Covid status:   none   Rec Symbicort 80 Take 2 puffs first thing in am and then another 2 puffs about 12 hours later.   Only use your albuterol as a rescue medication        03/13/2021  f/u ov/[name] re: ***   maint on ***  No chief complaint on file.   Dyspnea:  *** Cough: *** Sleeping: *** SABA use: *** 02: *** Covid status:   ***   No obvious day to day or daytime variability or assoc excess/ purulent sputum or mucus plugs or hemoptysis or cp or chest tightness, subjective wheeze or overt sinus or hb symptoms.   *** without nocturnal  or early am exacerbation  of respiratory  c/o's or need for noct saba. Also denies any obvious fluctuation of symptoms with weather or environmental changes or other aggravating or alleviating factors except as outlined above   No unusual exposure hx or h/o childhood pna/ asthma or knowledge of premature birth.  Current Allergies, Complete Past Medical History, Past Surgical History, Family History, and Social History were reviewed in [employer] record.  ROS  The following are not active complaints unless bolded Hoarseness, sore throat, dysphagia, dental problems, itching, sneezing,  nasal congestion or discharge of excess mucus or purulent secretions, ear ache,   fever, chills, sweats, unintended wt loss or wt gain, classically pleuritic or exertional cp,  orthopnea pnd or arm/hand swelling  or leg swelling, presyncope, palpitations, abdominal pain, anorexia, nausea, vomiting, diarrhea  or change in bowel habits or change in bladder habits, change in stools or change in urine, dysuria, hematuria,  rash, arthralgias, visual complaints, headache, numbness, weakness or ataxia or problems with walking or coordination,  change in mood or  memory.        No outpatient medications have been marked as taking for the 03/13/21 encounter (Appointment) with Tanda Rockers, MD.               Objective:   Physical Exam  wts   03/13/2021        ***   11/30/2020      260 09/02/2020       260 03/03/2018       274  10/01/2017       260  06/22/2015          245  04/13/15 243 lb 6.4 oz (110.406 kg)  04/03/15 239 lb (108.41 kg)  03/14/15 242 lb 1.6 oz (109.816 kg)     Vital signs reviewed  03/13/2021  - Note at rest 02 sats  ***% on ***   General appearance:    ***                 Assessment & Plan:

## 2021-03-28 DIAGNOSIS — I10 Essential (primary) hypertension: Secondary | ICD-10-CM | POA: Diagnosis not present

## 2021-03-28 DIAGNOSIS — Z20822 Contact with and (suspected) exposure to covid-19: Secondary | ICD-10-CM | POA: Diagnosis not present

## 2021-04-07 ENCOUNTER — Other Ambulatory Visit: Payer: Self-pay | Admitting: Family Medicine

## 2021-05-09 ENCOUNTER — Ambulatory Visit: Payer: Medicaid Other | Admitting: Internal Medicine

## 2021-05-10 ENCOUNTER — Ambulatory Visit (INDEPENDENT_AMBULATORY_CARE_PROVIDER_SITE_OTHER): Payer: Medicare Other | Admitting: Internal Medicine

## 2021-05-10 ENCOUNTER — Encounter: Payer: Self-pay | Admitting: Internal Medicine

## 2021-05-10 DIAGNOSIS — J45909 Unspecified asthma, uncomplicated: Secondary | ICD-10-CM

## 2021-05-10 DIAGNOSIS — R0609 Other forms of dyspnea: Secondary | ICD-10-CM | POA: Diagnosis not present

## 2021-05-10 LAB — CBC WITH DIFFERENTIAL/PLATELET
Basophils Absolute: 0.1 10*3/uL (ref 0.0–0.1)
Basophils Relative: 0.8 % (ref 0.0–3.0)
Eosinophils Absolute: 0.3 10*3/uL (ref 0.0–0.7)
Eosinophils Relative: 4.1 % (ref 0.0–5.0)
HCT: 35.6 % — ABNORMAL LOW (ref 36.0–46.0)
Hemoglobin: 11.4 g/dL — ABNORMAL LOW (ref 12.0–15.0)
Lymphocytes Relative: 29.4 % (ref 12.0–46.0)
Lymphs Abs: 2 10*3/uL (ref 0.7–4.0)
MCHC: 32.1 g/dL (ref 30.0–36.0)
MCV: 78.7 fl (ref 78.0–100.0)
Monocytes Absolute: 0.6 10*3/uL (ref 0.1–1.0)
Monocytes Relative: 8.4 % (ref 3.0–12.0)
Neutro Abs: 3.9 10*3/uL (ref 1.4–7.7)
Neutrophils Relative %: 57.3 % (ref 43.0–77.0)
Platelets: 334 10*3/uL (ref 150.0–400.0)
RBC: 4.52 Mil/uL (ref 3.87–5.11)
RDW: 15.4 % (ref 11.5–15.5)
WBC: 6.8 10*3/uL (ref 4.0–10.5)

## 2021-05-10 LAB — BASIC METABOLIC PANEL
BUN: 7 mg/dL (ref 6–23)
CO2: 27 mEq/L (ref 19–32)
Calcium: 9.2 mg/dL (ref 8.4–10.5)
Chloride: 103 mEq/L (ref 96–112)
Creatinine, Ser: 0.7 mg/dL (ref 0.40–1.20)
GFR: 108.43 mL/min (ref >60.00)
Glucose, Bld: 89 mg/dL (ref 70–99)
Potassium: 3.8 mEq/L (ref 3.5–5.1)
Sodium: 138 mEq/L (ref 135–145)

## 2021-05-10 LAB — BRAIN NATRIURETIC PEPTIDE: Pro B Natriuretic peptide (BNP): 59 pg/mL (ref 0.0–100.0)

## 2021-05-10 LAB — TROPONIN I (HIGH SENSITIVITY): High Sens Troponin I: 4 ng/L (ref 2–17)

## 2021-05-10 MED ORDER — BUDESONIDE-FORMOTEROL FUMARATE 160-4.5 MCG/ACT IN AERO: INHALATION_SPRAY | RESPIRATORY_TRACT | 12 refills | Status: DC

## 2021-05-10 MED ORDER — PANTOPRAZOLE SODIUM 40 MG PO TBEC: 40.0000 mg | DELAYED_RELEASE_TABLET | Freq: Every day | ORAL | 2 refills | Status: DC

## 2021-05-10 MED ORDER — PREDNISONE 10 MG PO TABS: ORAL_TABLET | ORAL | 0 refills | Status: DC

## 2021-05-10 MED ORDER — FAMOTIDINE 20 MG PO TABS: ORAL_TABLET | ORAL | 11 refills | Status: DC

## 2021-05-10 MED ORDER — ALBUTEROL SULFATE (2.5 MG/3ML) 0.083% IN NEBU: 2.5000 mg | INHALATION_SOLUTION | Freq: Four times a day (QID) | RESPIRATORY_TRACT | 12 refills | Status: AC | PRN

## 2021-05-10 MED ORDER — BUDESONIDE-FORMOTEROL FUMARATE 80-4.5 MCG/ACT IN AERO: INHALATION_SPRAY | RESPIRATORY_TRACT | 12 refills | Status: DC

## 2021-05-10 NOTE — Progress Notes (Signed)
? ?Subjective:  ? ?Patient ID: [name], female    DOB: December 07, 1980,   MRN: [id number] ? ?  ?Brief patient profile:  ?[age]  yobf with asthma since age 89 on 23maint rx ever since then quit smoking 2004 and no better intially seen in pulmonary clinic by Dr Sung AmabileSimonds early 2000s followed at [facility] fm practice/ ER with dtca asthma she attributed to allergies self referred to Pulmonary clinic 04/13/2015 p last ov: ? ? ?04/13/2015 1st [place] [facility] office visit/ Jessa Stinson  maint rx symbicort 160 2 bid ?Chief Complaint  ?Patient presents with  ? Pulmonary Consult  ?  Self referral Asthma- former pt, last seen 2013  ?while on maint on symbicort 160 2bid but two er trips both p exp to dogs while [facility] but getting ready to move.  ?symbicort is 90 dollars/ proair is 45 and uses maybe one every 6 m and used 3 x then to ER  ?Off symb x 3 days  ?Overt HB on bcps ?rec ?Plan A = Automatic = Symbicort 160 Take 2 puffs first thing in am and then another 2 puffs about 12 hours later ?                                    Singulair 10 mg each day  ?Plan B = Backup ?Only use your albuterol as a rescue medication  ?Work on maintaining perfect inhaler technique:   ?Continue pepcid 20 mg at bedtime and as needed during the day ?GERD diet reviewed, bed blocks rec  ? ?  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 Follow-up  ?  Patient reports that she has had some increased shortness of breath with exertion x 6 months,   ?Dyspnea:  flat surface slow ok ?Cough: worse on bad breathing days  ?Sleeping: on 30 degrees tempurpedic ?SABA use: 1-2 x per day - no longer neb  ?02: none ?Covid status:   vax x none  had it while pregnant summer 2021  ?Rec ?Plan A = Automatic = Always=   Symbicort 160 Take 2 puffs first thing in am and then another 2 puffs about 12 hours later.  ?Work on inhaler technique:   ?Plan B = Backup (to  supplement plan A, not to replace it) ?Only use your albuterol inhaler as a rescue medication ?Prednisone 10 mg take  4 each am x 2 days,   2 each am x 2 days,  1 each am x 2 days and stop  ?Please schedule a follow up visit in 3 months but call sooner if needed or go to community wellness center (ask for Dr Delford FieldWright)  ? ? ?11/30/2020  f/u ov/[name] re: asthma maint on symbiocrt 160 2bid   ?Dyspnea:  strolling with baby  ?Cough: none  ?Sleeping: most the time flat now  ?SABA use: 1-3 x days ?02: none  ?Covid status:   none  ?Rec ?Symbicort 80 Take 2 puffs first thing in am and then another 2 puffs about 12 hours later.  ?Only use your albuterol as a rescue medication ? ? ?05/10/2021  f/u ov/[name] re: asthma maint on symbicort 160 Take 2 puffs first thing in am and then another 2 puffs about 12 hours later with new cp on "new bp med " > pharmacy records  requested  ?Chief Complaint  ?Patient presents with  ? Follow-up  ?  Patient states that she has had more shortness of breath over the last 2 weeks.   ?Dyspnea:  worse x sev weeks  ?Cough: assoc with itching sneezing x sev weeks ?Sleeping: sev spells  2-3 am better p saba  ?SABA use: was couple times a week, now 3-4 x daily  ?02: none  ?Covid status:   never vax  ?New cp x  2-3 days = midline ant chest pressure has waxed and wane since onset ? Worse with exertion s assoc overt HB N or V or diaphoresis. ? ? ?No obvious other patterns day to day or daytime variability or assoc excess/ purulent sputum or mucus plugs or hemoptysis or  subjective wheeze or overt sinus or hb symptoms.  ? ? Also denies any obvious fluctuation of symptoms with weather or environmental changes or other aggravating or alleviating factors except as outlined above  ? ?No unusual exposure hx or h/o childhood pna knowledge of premature birth. ? ?Current Allergies, Complete Past Medical History, Past Surgical History, Family History, and Social History were reviewed in [employer]  record. ? ?ROS  The following are not active complaints unless bolded ?Hoarseness, sore throat, dysphagia, dental problems, itching, sneezing,  nasal congestion or discharge of excess mucus or purulent secretions, ear ache,   fever, chills, sweats, unintended wt loss or wt gain, classically pleuritic or exertional cp,  orthopnea pnd or arm/hand swelling  or leg swelling, presyncope, palpitations, abdominal pain, anorexia, nausea, vomiting, diarrhea  or change in bowel habits or change in bladder habits, change in stools or change in urine, dysuria, hematuria,  rash, arthralgias, visual complaints, headache, numbness, weakness or ataxia or problems with walking or coordination,  change in mood or  memory. ?      ? ?Current Meds  ?Medication Sig  ? albuterol (PROVENTIL) (2.5 MG/3ML) 0.083% nebulizer solution Take 3 mLs (2.5 mg total) by nebulization every 6 (six) hours as needed for wheezing or shortness of breath.  ? budesonide-formoterol (SYMBICORT) 80-4.5 MCG/ACT inhaler Take 2 puffs first thing in am and then another 2 puffs about 12 hours later.  ? cetirizine (ZYRTEC) 10 MG tablet Take 1 tablet (10 mg total) by mouth daily.  ? diclofenac Sodium (VOLTAREN) 1 % GEL Apply 2 g topically 4 (four) times daily.  ? montelukast (SINGULAIR) 10 MG tablet TAKE 1 TABLET BY MOUTH AT BEDTIME  ? PROAIR HFA 108 (90 Base) MCG/ACT inhaler INHALE 2 PUFFS BY MOUTH EVERY 4 HOURS AS NEEDED FOR WHEEZING AND FOR SHORTNESS OF BREATH  ? [DISCONTINUED] albuterol (PROVENTIL) (2.5 MG/3ML) 0.083% nebulizer solution Take 3 mLs (2.5 mg total) by nebulization every 6 (six) hours as needed for wheezing or shortness of breath.  ? [DISCONTINUED] budesonide-formoterol (SYMBICORT) 80-4.5 MCG/ACT inhaler Take 2 puffs first thing in am and then another 2 puffs about 12 hours later.  ?    ? ?  ? ? ? ?   ?Objective:  ? Physical Exam ?  ?Wgs ? ?05/10/2021       261  ?11/30/2020     260 ?09/02/2020       260 ?03/03/2018       274  ?10/01/2017       260   ?06/22/2015          245  ?04/13/15 243 lb 6.4 oz (110.406 kg)  ?04/03/15 239 lb (108.41 kg)  ?03/14/15 242 lb 1.6 oz (109.816 kg)  ?  ?  ?  Vital signs reviewed  05/10/2021  - Note at rest 02 sats  97% on RA  ? ?General appearance:    amb obese bf nad easily confused with details of care. ? ? HEENT : min turbinate edema / oropharynx clear  ? ? ?NECK :  without JVD/Nodes/TM/ nl carotid upstrokes bilaterally ? ? ?LUNGS: no acc muscle use,  Nl contour chest late exp wheezing bilaterally without cough on insp or exp maneuvers ? ? ?CV:  RRR  no s3 or murmur or increase in P2, and no edema  ? ?ABD:  soft and nontender with nl inspiratory excursion in the supine position. No bruits or organomegaly appreciated, bowel sounds nl ? ?MS:  Nl gait/ ext warm without deformities, calf tenderness, cyanosis or clubbing ?No obvious joint restrictions  ? ?SKIN: warm and dry without lesions   ? ?NEURO:  alert, approp, nl sensorium with  no motor or cerebellar deficits apparent.   ?  ? ?  ?EKG p walking 05/10/2021 = NSR, no ischemic changes/ min chest tightness  ? ?  ? Labs ordered/ reviewed:  ? ? ?  Chemistry   ?   ?Component Value Date/Time  ? NA 138 05/10/2021 1227  ? NA 140 06/15/2020 1102  ? K 3.8 05/10/2021 1227  ? CL 103 05/10/2021 1227  ? CO2 27 05/10/2021 1227  ? BUN 7 05/10/2021 1227  ? BUN 14 06/15/2020 1102  ? CREATININE 0.70 05/10/2021 1227  ? CREATININE 0.62 08/25/2014 1152  ?    ?Component Value Date/Time  ? CALCIUM 9.2 05/10/2021 1227  ? ALKPHOS 66 03/04/2017 1404  ? AST 12 03/04/2017 1404  ? ALT 16 03/04/2017 1404  ? BILITOT 0.3 03/04/2017 1404  ?  ? ?  ? ?Lab Results  ?Component Value Date  ? WBC 6.8 05/10/2021  ? HGB 11.4 (L) 05/10/2021  ? HCT 35.6 (L) 05/10/2021  ? MCV 78.7 05/10/2021  ? PLT 334.0 05/10/2021  ?  ? ?Lab Results  ?Component Value Date  ? DDIMER 0.27 05/10/2021  ?  ?   ? ?Lab Results  ?Component Value Date  ? PROBNP 59.0 05/10/2021  ?  ? ?   ?  ? ? ? ?  ?Assessment & Plan:  ? ?

## 2021-05-10 NOTE — Patient Instructions (Signed)
Prednisone 10 mg take  4 each am x 2 days,   2 each am x 2 days,  1 each am x 2 days and stop  ? ?Increase Symbicort to 160 Take 2 puffs first thing in am and then another 2 puffs about 12 hours later.  ?  ?Pantoprazole (protonix) 40 mg   Take  30-60 min before first meal of the day and Pepcid (famotidine)  20 mg after supper until return to office - this is the best way to tell whether stomach acid is contributing to your problem.   ? ?GERD (REFLUX)  is an extremely common cause of respiratory symptoms just like yours , many times with no obvious heartburn at all.  ? ? It can be treated with medication, but also with lifestyle changes including elevation of the head of your bed (ideally with 6 -8inch blocks under the headboard of your bed),  Smoking cessation, avoidance of late meals, excessive alcohol, and avoid fatty foods, chocolate, peppermint, colas, red wine, and acidic juices such as orange juice.  ?NO MINT OR MENTHOL PRODUCTS SO NO COUGH DROPS  ?USE SUGARLESS CANDY INSTEAD (Jolley ranchers or Stover's or Life Savers) or even ice chips will also do - the key is to swallow to prevent all throat clearing. ?NO OIL BASED VITAMINS - use powdered substitutes.  Avoid fish oil when coughing.  ? ?Please remember to go to the lab department   for your tests - we will call you with the results when they are available. ?    ? ?Please schedule a follow up office visit in 6 weeks, call sooner if needed with all medications /inhalers/ solutions in hand so we can verify exactly what you are taking. This includes all medications from all doctors and over the counters  ?

## 2021-05-11 ENCOUNTER — Encounter: Payer: Self-pay | Admitting: Internal Medicine

## 2021-05-11 LAB — D-DIMER, QUANTITATIVE: D-Dimer, Quant: 0.27 mcg/mL FEU (ref <0.50)

## 2021-05-11 LAB — IGE: IgE (Immunoglobulin E), Serum: 65 kU/L (ref <=114)

## 2021-05-11 NOTE — Assessment & Plan Note (Addendum)
Onset in childhood  ?- 11/30/2020  After extensive coaching inhaler device,  effectiveness =    75% > continue symb 160 2bid ?- 05/10/2021  After extensive coaching inhaler device,  effectiveness =    80% with hfa  And flaring on 80 so change to 160 and added gerd rx  ?- 05/10/2021   Walked on RA  x  2  lap(s) =  approx 500  ft  @ slow pace, stopped due to    Sob with mild cp  with lowest 02 sats 98%  ? ?DDX of  difficult airways management almost all start with A and  include Adherence, Ace Inhibitors, Acid Reflux, Active Sinus Disease, Alpha 1 Antitripsin deficiency, Anxiety masquerading as Airways dz,  ABPA,  Allergy(esp in young), Aspiration (esp in elderly), Adverse effects of meds,  Active smoking or vaping, A bunch of PE's (a small clot burden can't cause this syndrome unless there is already severe underlying pulm or vascular dz with poor reserve) plus two Bs  = Bronchiectasis and Beta blocker use..and one C= CHF ? ?Adherence is always the initial "prime suspect" and is a multilayered concern that requires a "trust but verify" approach in every patient - starting with knowing how to use medications, especially inhalers, correctly, keeping up with refills and understanding the fundamental difference between maintenance and prns vs those medications only taken for a very short course and then stopped and not refilled.  ?- see hfa teaching ?- check pharmacy records ?- return with all meds in hand using a trust but verify approach to confirm accurate Medication  Reconciliation The principal here is that until we are certain that the  patients are doing what we've asked, it makes no sense to ask them to do more.  ? ?? Acid (or non-acid) GERD > always difficult to exclude as up to 75% of pts in some series report no assoc GI/ Heartburn symptoms and she has non-cardiac cp suggestive of HB> rec max (24h)  acid suppression and diet restrictions/ reviewed and instructions given in writing>> rec Call if not improving /  to ER if worse  ? ?? Adverse drug effects > pharmacy records requested ? ?? Allergy > double dose of symbiocrt for now and pred x 6 day taper ? ?? A bunch of PE's > D dimer nl - while a normal  or high normal value (seen commonly in the elderly or chronically ill)  may miss small peripheral pe, the clot burden with sob is moderately high and the d dimer  has a very high neg pred value if used in this setting.   ? ?? Chf/ cardiac cp > ekg neg p ex/ nl trop and BNP strongly rule against  ? ?>>>  F/u in 6 weeks, call sooner if needed ? ? ?Each maintenance medication was reviewed in detail including emphasizing most importantly the difference between maintenance and prns and under what circumstances the prns are to be triggered using an action plan format where appropriate. ? ?Total time for H and P, chart review, counseling, reviewing hfa device(s) , directly observing portions of ambulatory 02 saturation study/ and generating customized AVS unique to this office visit / same day charting = 33 min  ?     ?  ?      ?

## 2021-05-30 ENCOUNTER — Other Ambulatory Visit: Payer: Self-pay | Admitting: Internal Medicine

## 2021-05-30 DIAGNOSIS — R0609 Other forms of dyspnea: Secondary | ICD-10-CM

## 2021-06-21 ENCOUNTER — Ambulatory Visit (INDEPENDENT_AMBULATORY_CARE_PROVIDER_SITE_OTHER): Payer: Medicaid Other | Admitting: Internal Medicine

## 2021-06-21 ENCOUNTER — Encounter: Payer: Self-pay | Admitting: Internal Medicine

## 2021-06-21 DIAGNOSIS — J4540 Moderate persistent asthma, uncomplicated: Secondary | ICD-10-CM | POA: Diagnosis not present

## 2021-06-21 MED ORDER — MONTELUKAST SODIUM 10 MG PO TABS: 10.0000 mg | ORAL_TABLET | Freq: Every day | ORAL | 3 refills | Status: DC

## 2021-06-21 NOTE — Assessment & Plan Note (Signed)
Onset age 41   - NO 04/13/2015  = 119 3 d off symbicort - FENO 06/22/2015  107  - FENO 10/01/2017  =   47 - Allergy profile 10/01/2017 >  Eos 0.1/  IgE  100 ragweed, dog> cat roaches  03/03/2018  After extensive coaching inhaler device,  effectiveness =    95%  - Allergy screen 05/10/21  >  Eos 0.3 /  IgE  65  As long as has access to symb 160 All goals of chronic asthma control met including optimal function and elimination of symptoms with minimal need for rescue therapy.  Contingencies discussed in full including contacting this office immediately if not controlling the symptoms using the rule of two's.     F/u can be q 6 h  Referred to [facility] for samples if loses insurance.          Each maintenance medication was reviewed in detail including emphasizing most importantly the difference between maintenance and prns and under what circumstances the prns are to be triggered using an action plan format where appropriate.  Total time for H and P, chart review, counseling, reviewing hfa  device(s) and generating customized AVS unique to this office visit / same day charting = 25 min

## 2021-06-21 NOTE — Progress Notes (Signed)
Subjective:   Patient ID: [name], female    DOB: [birth year],   MRN: [id number]    Brief patient profile:  41  yobf with asthma since age 41 on maint rx ever since then quit smoking 2004 and no better intially seen in pulmonary clinic by Dr Sung Amabile early 2000s followed at [facility] fm practice/ ER with dtca asthma she attributed to allergies self referred to Pulmonary clinic 04/13/2015 p last ov:   04/13/2015 1st [facility] office visit/ [name]  maint rx symbicort 160 2 bid Chief Complaint  Patient presents with   Pulmonary Consult    Self referral Asthma- former pt, last seen 2013  while on maint on symbicort 160 2bid but two er trips both p exp to dogs while living at [employer] house but getting ready to move.  symbicort is 90 dollars/ proair is 45 and uses maybe one every 6 m and used 3 x then to ER  Off symb x 3 days  Overt HB on bcps rec Plan A = Automatic = Symbicort 160 Take 2 puffs first thing in am and then another 2 puffs about 12 hours later                                     Singulair 10 mg each day  Plan B = Backup Only use your albuterol as a rescue medication  Work on maintaining perfect inhaler technique:   Continue pepcid 20 mg at bedtime and as needed during the day GERD diet reviewed, bed blocks rec     09/02/2020  re-establish ov/[name] re: asthma in pt nursing maint on symbicort/singulair s/p IUP 10/2019  @ 270  Chief Complaint  Patient presents with   Follow-up    Patient reports that she has had some increased shortness of breath with exertion x 6 months,   Dyspnea:  flat surface slow ok Cough: worse on bad breathing days  Sleeping: on 30 degrees tempurpedic SABA use: 1-2 x per day - no longer neb  02: none Covid status:   vax x none  had it while pregnant summer 2021  Rec Plan A = Automatic = Always=   Symbicort 160 Take 2 puffs first thing in am and then another 2 puffs about 12 hours later.  Work on inhaler technique:   Plan B = Backup (to  supplement plan A, not to replace it) Only use your albuterol inhaler as a rescue medication Prednisone 10 mg take  4 each am x 2 days,   2 each am x 2 days,  1 each am x 2 days and stop  Please schedule a follow up visit in 3 months but call sooner if needed or go to community wellness center (ask for Dr Delford Field)    11/30/2020  f/u ov/[name] re: asthma maint on symbiocrt 160 2bid   Dyspnea:  strolling with baby  Cough: none  Sleeping: most the time flat now  SABA use: 1-3 x days 02: none  Covid status:   none  Rec Symbicort 80 Take 2 puffs first thing in am and then another 2 puffs about 12 hours later.  Only use your albuterol as a rescue medication   05/10/2021  f/u ov/[name] re: asthma maint on symbicort 160 Take 2 puffs first thing in am and then another 2 puffs about 12 hours later with new cp on "new bp med " > pharmacy records  requested  Chief Complaint  Patient presents with   Follow-up    Patient states that she has had more shortness of breath over the last 2 weeks.   Dyspnea:  worse x sev weeks  Cough: assoc with itching sneezing x sev weeks Sleeping: sev spells  2-3 am better p saba  SABA use: was couple times a week, now 3-4 x daily  02: none  Covid status:   never vax  New cp x  2-3 days = midline ant chest pressure has waxed and wane since onset ? Worse with exertion s assoc overt HB N or V or diaphoresis Rec Prednisone 10 mg take  4 each am x 2 days,   2 each am x 2 days,  1 each am x 2 days and stop  Increase Symbicort to 160 Take 2 puffs first thing in am and then another 2 puffs about 12 hours later.   Pantoprazole (protonix) 40 mg   Take  30-60 min before first meal of the day and Pepcid (famotidine)  20 mg after supper until return to office  GERD diet reviewed, bed blocks rec  Please schedule a follow up office visit in 6 weeks, call sooner if needed with all medications /inhalers/ solutions in hand     06/21/2021  f/u ov/[name] re: asthma   maint on symbicort 160  / singulair and pepcid 20 mg p supper   Chief Complaint  Patient presents with   Follow-up    Pt states she has been doing okay since last visit and denies any complaints.    Dyspnea:  improving  Cough: min mucoid Sleeping: no longer waking up every night  SABA use: one or two x weeksly  02: no  Covid status:   never vax / covid while 3rd trimester   No obvious day to day or daytime variability or assoc excess/ purulent sputum or mucus plugs or hemoptysis or cp or chest tightness, subjective wheeze or overt sinus or hb symptoms.   Sleeping  without nocturnal  or early am exacerbation  of respiratory  c/o's or need for noct saba. Also denies any obvious fluctuation of symptoms with weather or environmental changes or other aggravating or alleviating factors except as outlined above   No unusual exposure hx or h/o childhood pna or knowledge of premature birth.  Current Allergies, Complete Past Medical History, Past Surgical History, Family History, and Social History were reviewed in [employer] record.  ROS  The following are not active complaints unless bolded Hoarseness, sore throat, dysphagia, dental problems, itching, sneezing,  nasal congestion or discharge of excess mucus or purulent secretions, ear ache,   fever, chills, sweats, unintended wt loss or wt gain, classically pleuritic or exertional cp,  orthopnea pnd or arm/hand swelling  or leg swelling, presyncope, palpitations, abdominal pain, anorexia, nausea, vomiting, diarrhea  or change in bowel habits or change in bladder habits, change in stools or change in urine, dysuria, hematuria,  rash, arthralgias, visual complaints, headache, numbness, weakness or ataxia or problems with walking or coordination,  change in mood or  memory.        Current Meds  Medication Sig   albuterol (PROVENTIL) (2.5 MG/3ML) 0.083% nebulizer solution Take 3 mLs (2.5 mg total) by nebulization every 6 (six) hours as needed for  wheezing or shortness of breath.   budesonide-formoterol (SYMBICORT) 160-4.5 MCG/ACT inhaler Take 2 puffs first thing in am and then another 2 puffs about 12 hours later.  cetirizine (ZYRTEC) 10 MG tablet Take 1 tablet (10 mg total) by mouth daily.   diclofenac Sodium (VOLTAREN) 1 % GEL Apply 2 g topically 4 (four) times daily.   famotidine (PEPCID) 20 MG tablet One after supper   montelukast (SINGULAIR) 10 MG tablet TAKE 1 TABLET BY MOUTH AT BEDTIME   pantoprazole (PROTONIX) 40 MG tablet TAKE 1 TABLET (40 MG TOTAL) BY MOUTH DAILY. TAKE 30-60 MIN BEFORE FIRST MEAL OF THE DAY   PROAIR HFA 108 (90 Base) MCG/ACT inhaler INHALE 2 PUFFS BY MOUTH EVERY 4 HOURS AS NEEDED FOR WHEEZING AND FOR SHORTNESS OF BREATH               Objective:   Physical Exam   Wts  06/21/2021         263  05/10/2021       261  11/30/2020     260 09/02/2020       260 03/03/2018       274  10/01/2017       260  06/22/2015          245  04/13/15 243 lb 6.4 oz (110.406 kg)  04/03/15 239 lb (108.41 kg)  03/14/15 242 lb 1.6 oz (109.816 kg)      Vital signs reviewed  06/21/2021  - Note at rest 02 sats  98% on RA   General appearance:    MO (by BMI) bf nad      HEENT : Oropharynx  clear     Nasal turbinates nl   NECK :  without  appent JVD/ palpable Nodes/TM    LUNGS: no acc muscle use,  Nl contour chest which is clear to A and P bilaterally without cough on insp or exp maneuvers   CV:  RRR  no s3 or murmur or increase in P2, and no edema   ABD:  obese soft and nontender with nl inspiratory excursion   MS:  Nl gait/ ext warm without deformities Or obvious joint restrictions  calf tenderness, cyanosis or clubbing    SKIN: warm and dry without lesions    NEURO:  alert, approp, nl sensorium with  no motor or cerebellar deficits apparent.     Assessment & Plan:

## 2021-06-21 NOTE — Patient Instructions (Signed)
Try [place] [facility] [place] for samples   Please schedule a follow up visit in 6  months but call sooner if needed

## 2022-01-22 ENCOUNTER — Ambulatory Visit (INDEPENDENT_AMBULATORY_CARE_PROVIDER_SITE_OTHER): Payer: Medicare Other | Admitting: Internal Medicine

## 2022-01-22 ENCOUNTER — Encounter: Payer: Self-pay | Admitting: Internal Medicine

## 2022-01-22 ENCOUNTER — Other Ambulatory Visit: Payer: Self-pay | Admitting: Family Medicine

## 2022-01-22 VITALS — BP 136/88 | HR 94 | Temp 98.3°F | Ht 66.0 in | Wt 238.6 lb

## 2022-01-22 DIAGNOSIS — J45909 Unspecified asthma, uncomplicated: Secondary | ICD-10-CM

## 2022-01-22 LAB — POCT EXHALED NITRIC OXIDE: FeNO level (ppb): 111

## 2022-01-22 MED ORDER — RABEPRAZOLE SODIUM 20 MG PO TBEC: 20.0000 mg | DELAYED_RELEASE_TABLET | Freq: Every day | ORAL | 11 refills | Status: DC

## 2022-01-22 MED ORDER — PREDNISONE 10 MG PO TABS: ORAL_TABLET | ORAL | 0 refills | Status: DC

## 2022-01-22 MED ORDER — FAMOTIDINE 20 MG PO TABS: ORAL_TABLET | ORAL | 11 refills | Status: DC

## 2022-01-22 NOTE — Patient Instructions (Addendum)
Change pepcid to 20 mg after bfast and supper if you can't get aciphex  Ideally would like you to start:  Aciphex 20 mg Take 30-60 min before first meal of the day and pepcid 20 mg an hour before bed  GERD (REFLUX)  is an extremely common cause of respiratory symptoms just like yours , many times with no obvious heartburn at all.    It can be treated with medication, but also with lifestyle changes including elevation of the head of your bed (ideally with 6 -8inch blocks under the headboard of your bed or at least 10 degrees),  Smoking cessation, avoidance of late meals, excessive alcohol, and avoid fatty foods, chocolate, peppermint, colas, red wine, and acidic juices such as orange juice.  NO MINT OR MENTHOL PRODUCTS SO NO COUGH DROPS  USE SUGARLESS CANDY INSTEAD (Jolley ranchers or Stover's or Life Savers) or even ice chips will also do - the key is to swallow to prevent all throat clearing. NO OIL BASED VITAMINS - use powdered substitutes.  Avoid fish oil when coughing.   Set up follow up with Dr Delfina Redwood next available  Please schedule a follow up visit in 3 months but call sooner if needed - bring your medications   Late add: due to high FEN0 :  Prednisone 10 mg take  4 each am x 2 days,   2 each am x 2 days,  1 each am x 2 days and stop

## 2022-01-22 NOTE — Progress Notes (Unsigned)
Subjective:   Patient ID: [name], female    DOB: Sep 11, 1980,   MRN: [id number]    Brief patient profile:  66  yobf with asthma since age 42 on maint rx ever since then quit smoking 2004 and no better intially seen in pulmonary clinic by Dr Sung Amabile early 2000s followed at [facility] fm practice/ ER with dtca asthma she attributed to allergies self referred to Pulmonary clinic 04/13/2015 p last ov:   04/13/2015 1st [facility] office visit/ Lavren Lewan  maint rx symbicort 160 2 bid Chief Complaint  Patient presents with   Pulmonary Consult    Self referral Asthma- former pt, last seen 2013  while on maint on symbicort 160 2bid but two er trips both p exp to dogs while living at [employer] house but getting ready to move.  symbicort is 90 dollars/ proair is 45 and uses maybe one every 6 m and used 3 x then to ER  Off symb x 3 days  Overt HB on bcps rec Plan A = Automatic = Symbicort 160 Take 2 puffs first thing in am and then another 2 puffs about 12 hours later                                     Singulair 10 mg each day  Plan B = Backup Only use your albuterol as a rescue medication  Work on maintaining perfect inhaler technique:   Continue pepcid 20 mg at bedtime and as needed during the day GERD diet reviewed, bed blocks rec     09/02/2020  re-establish ov/Iban Utz re: asthma in pt nursing maint on symbicort/singulair s/p IUP 10/2019  @ 270  Chief Complaint  Patient presents with   Follow-up    Patient reports that she has had some increased shortness of breath with exertion x 6 months,   Dyspnea:  flat surface slow ok Cough: worse on bad breathing days  Sleeping: on 30 degrees tempurpedic SABA use: 1-2 x per day - no longer neb  02: none Covid status:   vax x none  had it while pregnant summer 2021  Rec Plan A = Automatic = Always=   Symbicort 160 Take 2 puffs first thing in am and then another 2 puffs about 12 hours later.  Work on inhaler technique:   Plan B = Backup (to  supplement plan A, not to replace it) Only use your albuterol inhaler as a rescue medication Prednisone 10 mg take  4 each am x 2 days,   2 each am x 2 days,  1 each am x 2 days and stop  Please schedule a follow up visit in 3 months but call sooner if needed or go to community wellness center (ask for Dr Delford Field)    06/21/2021  f/u ov/Laren Whaling re: asthma   maint on symbicort 160 / singulair and pepcid 20 mg p supper   Chief Complaint  Patient presents with   Follow-up    Pt states she has been doing okay since last visit and denies any complaints.   Dyspnea:  improving  Cough: min mucoid Sleeping: no longer waking up every night  SABA use: one or two x weeksly  02: no  Covid status:   never vax / covid while 3rd trimester Rec Try AZ & Me for samples Please schedule a follow up visit in 6  months but call sooner if needed  01/22/2022  f/u ov/Leronda Lewers re: asthma/ noct cp x years  maint on symbicort 160 / singulair / ? Pepcid 10 only  Chief Complaint  Patient presents with   Follow-up    SOB and waking up with chest pain about 3 times a week.  Sx x 2 1/2 months  Dyspnea:  ? More tired than sob / no ex cp/cp "burning" quality  only happens noct midline s radiation/ s nausea/ pattern comes and goes x years. Cough: varies, esp in am / mucoid / does not wake up prematurely  Sleeping: on side flat bed  SABA use: not used ton day of ov  / rarely need neb  02: none  Covid status:   never vax      No obvious day to day or daytime variability or assoc excess/ purulent sputum or mucus plugs or hemoptysis or  subjective wheeze or overt sinus or hb symptoms.    . Also denies any obvious fluctuation of symptoms with weather or environmental changes or other aggravating or alleviating factors except as outlined above   No unusual exposure hx or h/o childhood pna or knowledge of premature birth.  Current Allergies, Complete Past Medical History, Past Surgical History, Family History, and Social  History were reviewed in [employer] record.  ROS  The following are not active complaints unless bolded Hoarseness, sore throat, dysphagia, dental problems, itching, sneezing,  nasal congestion or discharge of excess mucus or purulent secretions, ear ache,   fever, chills, sweats, unintended wt loss or wt gain, classically pleuritic or exertional cp,  orthopnea pnd or arm/hand swelling  or leg swelling, presyncope, palpitations, abdominal pain, anorexia, nausea, vomiting, diarrhea  or change in bowel habits or change in bladder habits, change in stools or change in urine, dysuria, hematuria,  rash, arthralgias, visual complaints, headache, numbness, weakness or ataxia or problems with walking or coordination,  change in mood or  memory.        Current Meds  Medication Sig   albuterol (PROVENTIL) (2.5 MG/3ML) 0.083% nebulizer solution Take 3 mLs (2.5 mg total) by nebulization every 6 (six) hours as needed for wheezing or shortness of breath.   budesonide-formoterol (SYMBICORT) 160-4.5 MCG/ACT inhaler Take 2 puffs first thing in am and then another 2 puffs about 12 hours later.   cetirizine (ZYRTEC) 10 MG tablet Take 1 tablet (10 mg total) by mouth daily.   diclofenac Sodium (VOLTAREN) 1 % GEL Apply 2 g topically 4 (four) times daily.   famotidine (PEPCID) 20 MG tablet One after supper   montelukast (SINGULAIR) 10 MG tablet Take 1 tablet (10 mg total) by mouth at bedtime.   PROAIR HFA 108 (90 Base) MCG/ACT inhaler INHALE 2 PUFFS BY MOUTH EVERY 4 HOURS AS NEEDED FOR WHEEZING AND FOR SHORTNESS OF BREATH   tirzepatide (MOUNJARO) 7.5 MG/0.5ML Pen Inject 7.5 mg into the skin once a week.   valsartan (DIOVAN) 40 MG tablet Take 40 mg by mouth daily.              Objective:   Physical Exam   Wts  01/22/2022         238  06/21/2021         263  05/10/2021       261  11/30/2020     260 09/02/2020       260 03/03/2018       274  10/01/2017       260  06/22/2015  245   04/13/15 243 lb 6.4 oz (110.406 kg)  04/03/15 239 lb (108.41 kg)  03/14/15 242 lb 1.6 oz (109.816 kg)     Vital signs reviewed  01/22/2022  - Note at rest 02 sats  99% on RA   General appearance:    mod obese amb bf nad    HEENT : Oropharynx  clear        NECK :  without  apparent JVD/ palpable Nodes/TM    LUNGS: no acc muscle use,  Nl contour chest which is clear to A and P bilaterally without cough on insp or exp maneuvers   CV:  RRR  no s3 or murmur or increase in P2, and no edema   ABD:  obese soft and nontender with nl inspiratory excursion in the supine position. No bruits or organomegaly appreciated   MS:  Nl gait/ ext warm without deformities Or obvious joint restrictions  calf tenderness, cyanosis or clubbing    SKIN: warm and dry without lesions    NEURO:  alert, approp, nl sensorium with  no motor or cerebellar deficits apparent.          Assessment & Plan:

## 2022-01-22 NOTE — Telephone Encounter (Signed)
Can you guys clarify with patient who she is seeing for primary care? It appears she established primary care with Dr. Seward Carol at [facility]. If she is seeing a different PCP office, refill should come from there.  If she is planning to continue here, please ask her to schedule an appointment since it's been awhile since she's been seen here  Thanks! Leeanne Rio, MD

## 2022-01-23 ENCOUNTER — Encounter: Payer: Self-pay | Admitting: Internal Medicine

## 2022-01-23 NOTE — Assessment & Plan Note (Signed)
Onset in childhood  - 11/30/2020  After extensive coaching inhaler device,  effectiveness =    75% > continue symb 160 2bid - 05/10/2021  After extensive coaching inhaler device,  effectiveness =    80% with hfa  And flaring on 80 so change to 160 and added gerd rx  - 05/10/2021   Walked on RA  x  2  lap(s) =  approx 500  ft  @ slow pace, stopped due to    Sob with mild cp  with lowest 02 sats 98%  - 01/22/2022  After extensive coaching inhaler device,  effectiveness =    80%  - FENO  01/22/2022  = 111 on symb 160 2bid / singulair > rx pred x 6 d  Mild flare with noct burning suggestive mostly of gerd but likely having some noct asthma/chest tightness as well so rec pred as above and max gerd rx with aciphex as can't take protonix plus pepcid in crease to 20 mg q pm and diet/ HOB elevation          Each maintenance medication was reviewed in detail including emphasizing most importantly the difference between maintenance and prns and under what circumstances the prns are to be triggered using an action plan format where appropriate.  Total time for H and P, chart review, counseling, reviewing hfa/neb device(s) and generating customized AVS unique to this office visit / same day charting = > 30 min for multiple  refractory respiratory  symptoms of uncertain etiology

## 2022-02-07 IMAGING — US US OB < 14 WEEKS - US OB TV
1 series · 15 of 28 positions shown · non-contrast
Comparison: None.

CLINICAL DATA: Pain.

EXAM:
OBSTETRIC <14 WK US AND TRANSVAGINAL OB US
TECHNIQUE: Both transabdominal and transvaginal ultrasound examinations were
performed for complete evaluation of the gestation as well as the
maternal uterus, adnexal regions, and pelvic cul-de-sac.
Transvaginal technique was performed to assess early pregnancy.

[Series 1: us ob < 14 weeks - us ob tv · 15 of 31 slices shown]
[im 1/31]
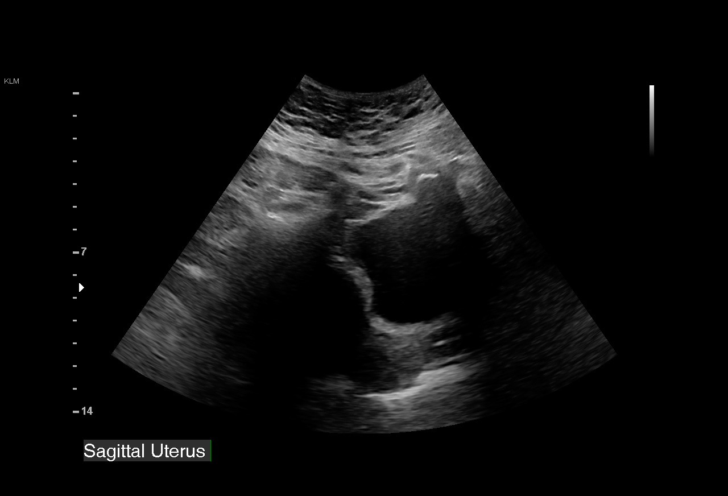
[im 3/31]
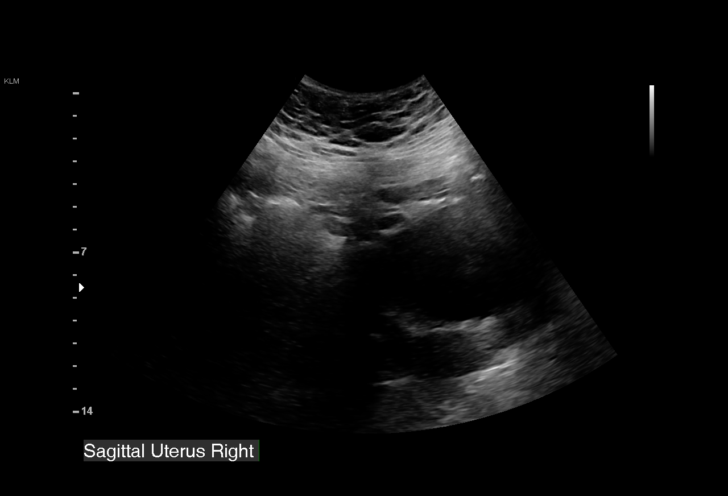
[im 5/31]
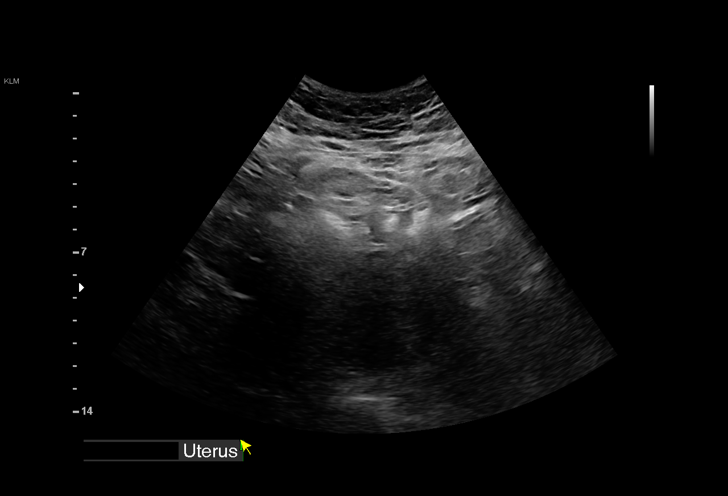
[im 7/31]
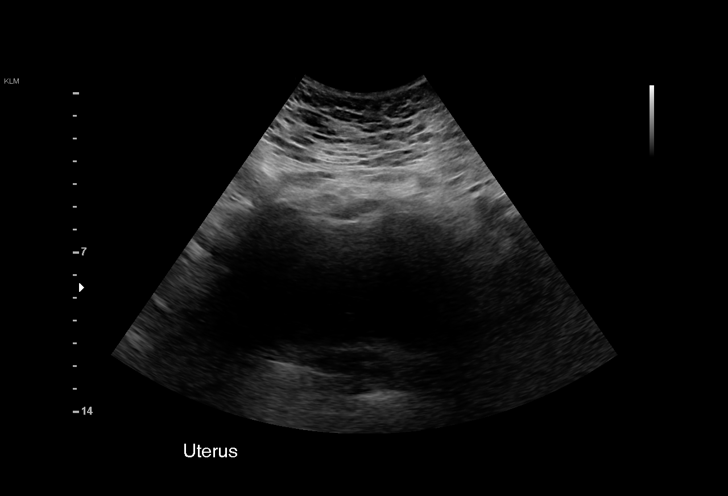
[im 9/31]
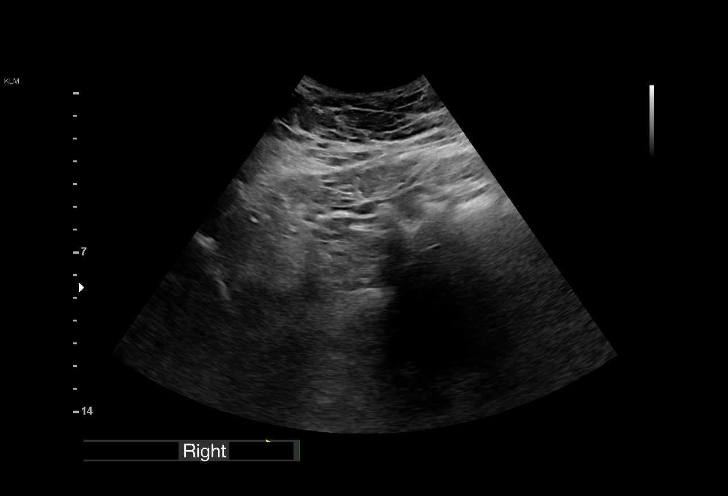
[im 12/31]
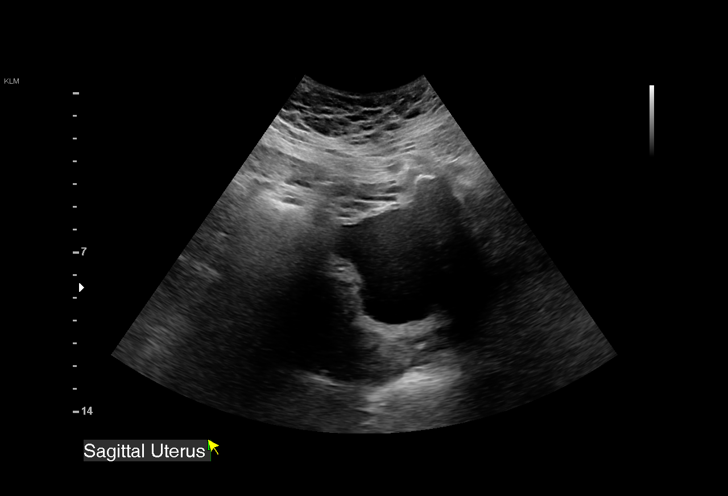
[im 14/31]
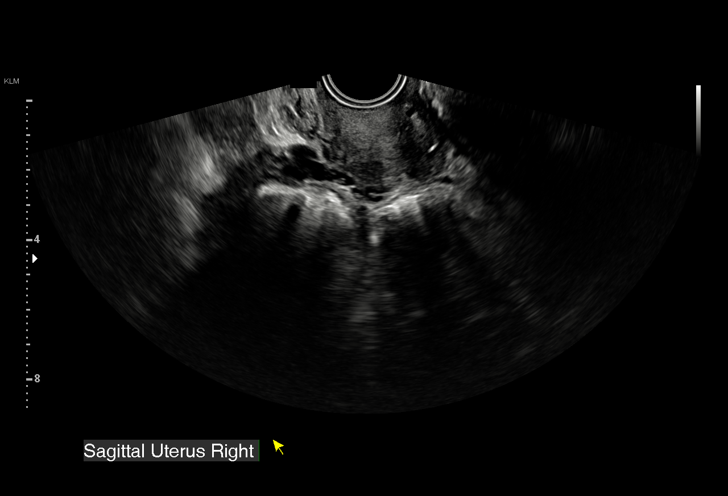
[im 16/31]
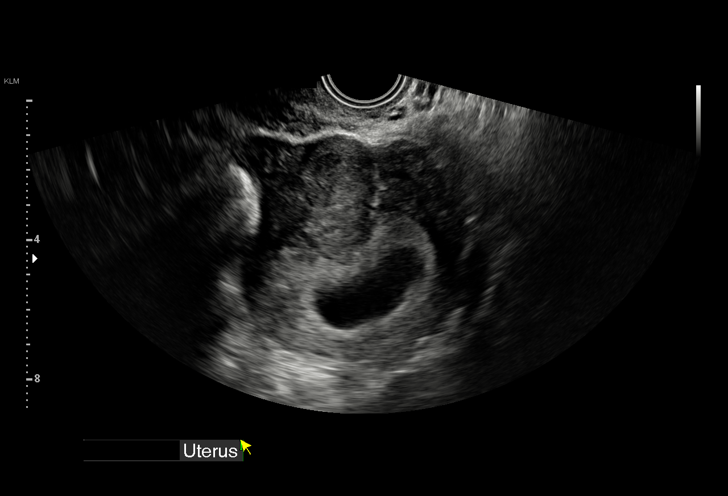
[im 17/31]
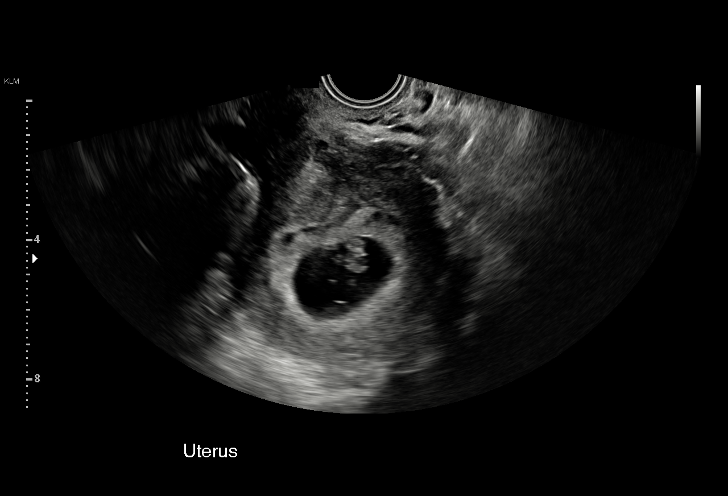
[im 19/31]
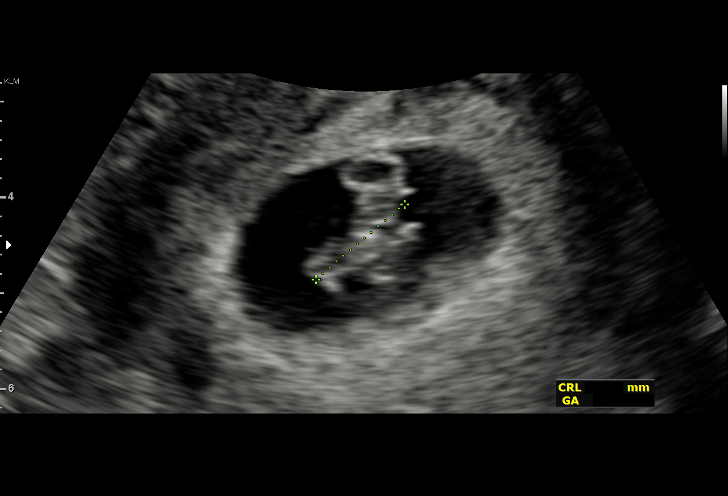
[im 22/31]
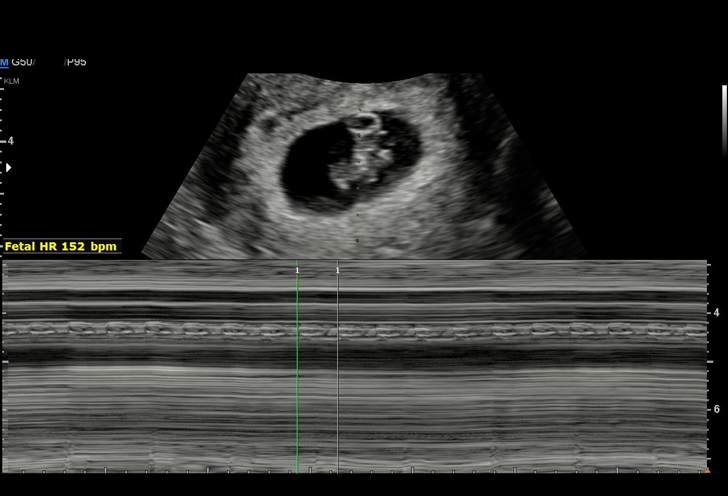
[im 24/31]
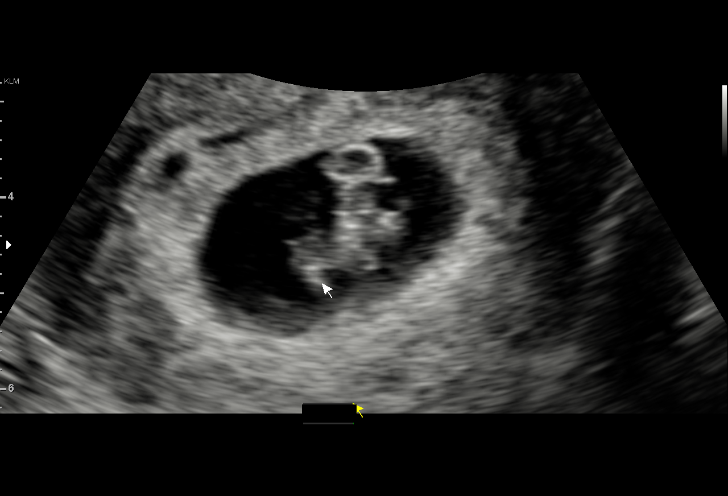
[im 26/31]
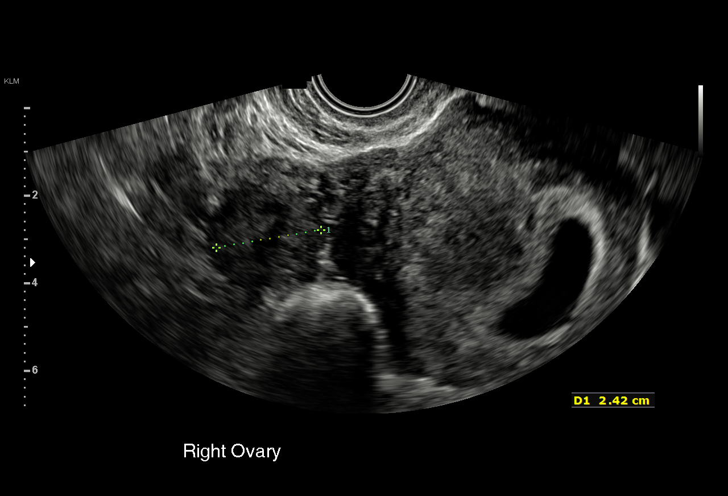
[im 28/31]
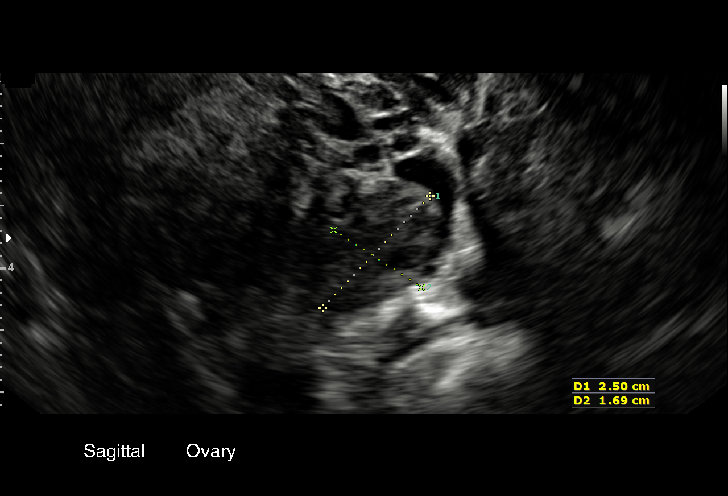
[im 31/31]
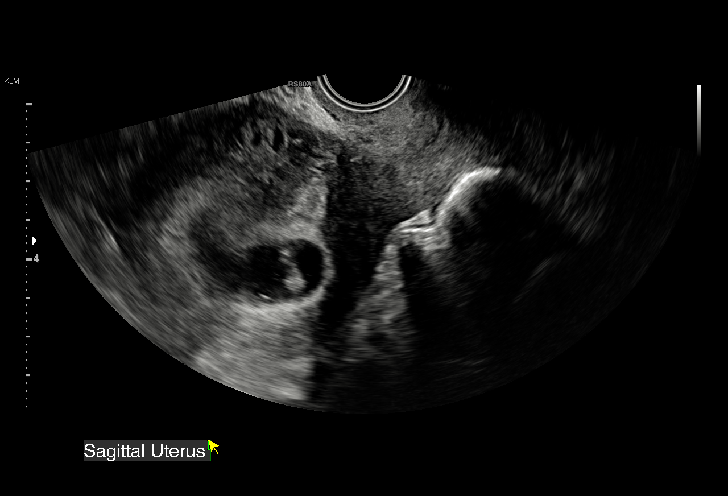

[15 of 28 positions shown; findings below may reference images not displayed]

FINDINGS: Intrauterine gestational sac: Single

Yolk sac:  Visualized.

Embryo:  Visualized.

Cardiac Activity: Visualized.

Heart Rate: 152 bpm

CRL: 13.3 mm   7 w   4 d                  US EDC: 11/13/2019

Subchorionic hemorrhage:  None visualized.

Maternal uterus/adnexae: There is no significant maternal
abnormality. There is a trace amount of pelvic free fluid.
IMPRESSION: Single live IUP at 7 weeks and 4 days as detailed above.

## 2022-03-04 NOTE — Progress Notes (Deleted)
Subjective:   Patient ID: [name], female    DOB: 1980/04/24,   MRN: [id number]    Brief patient profile:  42 yobf with asthma since age 42 on maint rx ever since then quit smoking 2004 and no better intially seen in pulmonary clinic by Dr Alva Garnet early 2000s followed at [facility] fm practice/ ER with dtca asthma she attributed to allergies self referred to Pulmonary clinic 04/13/2015 p last ov:   04/13/2015 1st [facility] office visit/ [name]  maint rx symbicort 160 2 bid Chief Complaint  Patient presents with   Pulmonary Consult    Self referral Asthma- former pt, last seen 2013  while on maint on symbicort 160 2bid but two er trips both p exp to dogs while living at [employer] house but getting ready to move.  symbicort is 90 dollars/ proair is 45 and uses maybe one every 6 m and used 3 x then to ER  Off symb x 3 days  Overt HB on bcps rec Plan A = Automatic = Symbicort 160 Take 2 puffs first thing in am and then another 2 puffs about 12 hours later                                     Singulair 10 mg each day  Plan B = Backup Only use your albuterol as a rescue medication  Work on maintaining perfect inhaler technique:   Continue pepcid 20 mg at bedtime and as needed during the day GERD diet reviewed, bed blocks rec     09/02/2020  re-establish ov/[name] re: asthma in pt nursing maint on symbicort/singulair s/p IUP 10/2019  @ 270  Chief Complaint  Patient presents with   Follow-up    Patient reports that she has had some increased shortness of breath with exertion x 6 months,   Dyspnea:  flat surface slow ok Cough: worse on bad breathing days  Sleeping: on 30 degrees tempurpedic SABA use: 1-2 x per day - no longer neb  02: none Covid status:   vax x none  had it while pregnant summer 2021  Rec Plan A = Automatic = Always=   Symbicort 160 Take 2 puffs first thing in am and then another 2 puffs about 12 hours later.  Work on inhaler technique:   Plan B = Backup (to  supplement plan A, not to replace it) Only use your albuterol inhaler as a rescue medication Prednisone 10 mg take  4 each am x 2 days,   2 each am x 2 days,  1 each am x 2 days and stop  Please schedule a follow up visit in 3 months but call sooner if needed or go to community wellness center (ask for Dr Joya Gaskins)        01/22/2022  f/u ov/[name] re: asthma/ noct cp x years  maint on symbicort 160 / singulair / ? Pepcid 10 only  Chief Complaint  Patient presents with   Follow-up    SOB and waking up with chest pain about 3 times a week.  Sx x 2 1/2 months  Dyspnea:  ? More tired than sob / no ex cp/cp "burning" quality  only happens noct midline s radiation/ s nausea/ pattern comes and goes x years. Cough: varies, esp in am / mucoid / does not wake up prematurely  Sleeping: on side flat bed  SABA use: not used ton  day of ov  / rarely need neb  02: none  Covid status:   never vax  Rec Change pepcid to 20 mg after bfast and supper if you can't get aciphex Aciphex 20 mg Take 30-60 min before first meal of the day and pepcid 20 mg an hour before bed GERD r  Set up follow up with Dr Delfina Redwood next available Late add: due to high FEN0 :  Prednisone 10 mg take  4 each am x 2 days,   2 each am x 2 days,  1 each am x 2 days and stop      03/05/2022  f/u ov/[name] re: ***   maint on ***   meds *** No chief complaint on file.   Dyspnea:  *** Cough: *** Sleeping: *** SABA use: *** 02: *** Covid status:   *** Lung cancer screening :  ***    No obvious day to day or daytime variability or assoc excess/ purulent sputum or mucus plugs or hemoptysis or cp or chest tightness, subjective wheeze or overt sinus or hb symptoms.   *** without nocturnal  or early am exacerbation  of respiratory  c/o's or need for noct saba. Also denies any obvious fluctuation of symptoms with weather or environmental changes or other aggravating or alleviating factors except as outlined above   No unusual exposure hx or  h/o childhood pna/ asthma or knowledge of premature birth.  Current Allergies, Complete Past Medical History, Past Surgical History, Family History, and Social History were reviewed in [employer] record.  ROS  The following are not active complaints unless bolded Hoarseness, sore throat, dysphagia, dental problems, itching, sneezing,  nasal congestion or discharge of excess mucus or purulent secretions, ear ache,   fever, chills, sweats, unintended wt loss or wt gain, classically pleuritic or exertional cp,  orthopnea pnd or arm/hand swelling  or leg swelling, presyncope, palpitations, abdominal pain, anorexia, nausea, vomiting, diarrhea  or change in bowel habits or change in bladder habits, change in stools or change in urine, dysuria, hematuria,  rash, arthralgias, visual complaints, headache, numbness, weakness or ataxia or problems with walking or coordination,  change in mood or  memory.        No outpatient medications have been marked as taking for the 03/05/22 encounter (Appointment) with Tanda Rockers, MD.               Objective:   Physical Exam   Wts  03/05/2022       ***  01/22/2022         238  06/21/2021         263  05/10/2021       261  11/30/2020     260 09/02/2020       260 03/03/2018       274  10/01/2017       260  06/22/2015          245  04/13/15 243 lb 6.4 oz (110.406 kg)  04/03/15 239 lb (108.41 kg)  03/14/15 242 lb 1.6 oz (109.816 kg)    Vital signs reviewed  03/05/2022  - Note at rest 02 sats  ***% on ***   General appearance:    ***        Assessment & Plan:

## 2022-03-05 ENCOUNTER — Ambulatory Visit: Payer: 59 | Admitting: Internal Medicine

## 2022-03-23 ENCOUNTER — Other Ambulatory Visit: Payer: Self-pay | Admitting: Internal Medicine

## 2022-04-18 DIAGNOSIS — Z1231 Encounter for screening mammogram for malignant neoplasm of breast: Secondary | ICD-10-CM | POA: Diagnosis not present

## 2022-05-14 DIAGNOSIS — J40 Bronchitis, not specified as acute or chronic: Secondary | ICD-10-CM | POA: Diagnosis not present

## 2022-05-14 DIAGNOSIS — I10 Essential (primary) hypertension: Secondary | ICD-10-CM | POA: Diagnosis not present

## 2022-06-04 DIAGNOSIS — I10 Essential (primary) hypertension: Secondary | ICD-10-CM | POA: Diagnosis not present

## 2022-06-04 DIAGNOSIS — D649 Anemia, unspecified: Secondary | ICD-10-CM | POA: Diagnosis not present

## 2022-06-04 DIAGNOSIS — R7303 Prediabetes: Secondary | ICD-10-CM | POA: Diagnosis not present

## 2022-06-04 DIAGNOSIS — Z0000 Encounter for general adult medical examination without abnormal findings: Secondary | ICD-10-CM | POA: Diagnosis not present

## 2022-07-07 ENCOUNTER — Other Ambulatory Visit: Payer: Self-pay | Admitting: Internal Medicine

## 2022-07-15 ENCOUNTER — Other Ambulatory Visit: Payer: Self-pay | Admitting: Internal Medicine

## 2022-07-15 DIAGNOSIS — R0609 Other forms of dyspnea: Secondary | ICD-10-CM

## 2022-07-17 DIAGNOSIS — E119 Type 2 diabetes mellitus without complications: Secondary | ICD-10-CM | POA: Diagnosis not present

## 2022-08-03 DIAGNOSIS — I10 Essential (primary) hypertension: Secondary | ICD-10-CM | POA: Diagnosis not present

## 2022-11-12 ENCOUNTER — Ambulatory Visit (INDEPENDENT_AMBULATORY_CARE_PROVIDER_SITE_OTHER): Payer: Medicaid Other | Admitting: Internal Medicine

## 2022-11-12 ENCOUNTER — Encounter: Payer: Self-pay | Admitting: Internal Medicine

## 2022-11-12 VITALS — BP 136/88 | HR 59 | Temp 98.2°F | Ht 66.0 in | Wt 227.6 lb

## 2022-11-12 DIAGNOSIS — J45909 Unspecified asthma, uncomplicated: Secondary | ICD-10-CM | POA: Diagnosis not present

## 2022-11-12 MED ORDER — PREDNISONE 10 MG PO TABS: ORAL_TABLET | ORAL | 0 refills | Status: DC

## 2022-11-12 MED ORDER — AZITHROMYCIN 250 MG PO TABS: ORAL_TABLET | ORAL | 0 refills | Status: DC

## 2022-11-12 NOTE — Progress Notes (Signed)
Subjective:   Patient ID: [name], female    DOB: 10/28/1980,   MRN: [id number]    Brief patient profile:  27  yobf with asthma since age 42 on maint rx ever since then quit smoking 2004 and no better intially seen in pulmonary clinic by Dr Sung Amabile early 2000s followed at [facility] fm practice/ ER with dtca asthma she attributed to allergies self referred to Pulmonary clinic 04/13/2015 p last ov:   04/13/2015 1st [facility] office visit/ Savan Ruta  maint rx symbicort 160 2 bid Chief Complaint  Patient presents with   Pulmonary Consult    Self referral Asthma- former pt, last seen 2013  while on maint on symbicort 160 2bid but two er trips both p exp to dogs while living at [employer] house but getting ready to move.  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  Work on inhaler technique:   Plan B = Backup (to  supplement plan A, not to replace it) Only use your albuterol inhaler as a rescue medication Prednisone 10 mg take  4 each am x 2 days,   2 each am x 2 days,  1 each am x 2 days and stop  Please schedule a follow up visit in 3 months but call sooner if needed or go to community wellness center (ask for Dr Delford Field)    06/21/2021  f/u ov/Jalah Warmuth re: asthma   maint on symbicort 160 / singulair and pepcid 20 mg p supper   Chief Complaint  Patient presents with   Follow-up    Pt states she has been doing okay since last visit and denies any complaints.   Dyspnea:  improving  Cough: min mucoid Sleeping: no longer waking up every night  SABA use: one or two x weeksly  02: no  Covid status:   never vax / covid while 3rd trimester Rec Try AZ & Me for samples Please schedule a follow up visit in 6  months but call sooner if needed  01/22/2022  f/u ov/Yisell Sprunger re: asthma/ noct cp x years  maint on symbicort 160 / singulair / ? Pepcid 10 only  Chief Complaint  Patient presents with   Follow-up    SOB and waking up with chest pain about 3 times a week.  Sx x 2 1/2 months  Dyspnea:  ? More tired than sob / no ex cp/cp "burning" quality  only happens noct midline s radiation/ s nausea/ pattern comes and goes x years. Cough: varies, esp in am / mucoid / does not wake up prematurely  Sleeping: on side flat bed  SABA use: not used ton day of ov  / rarely need neb  02: none  Covid status:   never vax  Rec Change pepcid to 20 mg after bfast and supper if you can't get aciphex Ideally would like you to start: Aciphex 20 mg Take 30-60 min before first meal of the day and pepcid 20 mg an hour before bed. GERD diet reviewed, bed blocks rec  Set up follow up with Dr Nehemiah Settle next available  Please schedule a follow up visit in 3 months but call sooner if needed - bring your medications   Late add: due to high FEN0 :  Prednisone 10 mg take  4 each am x 2 days,   2 each am x 2 days,  1 each am x 2 days and stop     11/12/2022  f/u ov/Johaan Ryser re: asthma   maint on symbicort 160  did not  bring all  meds Chief Complaint  Patient presents with   Nasal Congestion    Patient states she went to [place] in September, and was sick. Pt states she has had congestion ever since. Patient states she has had coughing and wheezing. Patient states she is coughing up yellow mucous    Dyspnea:  still able to do housework despite flare  Cough: throughout day and some noct  Sleeping: 30 degree HObed on side with freq awakening since sep flair with URI  SABA use: twice daily  02: none       No obvious day to day or daytime variability or assoc   mucus plugs or hemoptysis or cp or chest tightness,  or overt sinus or hb symptoms.    Also denies any obvious fluctuation of symptoms with weather or environmental changes or other aggravating or alleviating factors except as outlined above   No unusual exposure hx or h/o childhood pna or knowledge of premature birth.  Current Allergies, Complete Past Medical History, Past Surgical History, Family History, and Social History were reviewed in [employer] record.  ROS  The following are not active complaints unless bolded Hoarseness, sore throat, dysphagia, dental problems, itching, sneezing,  nasal congestion or discharge of excess mucus or purulent secretions, ear ache,   fever, chills, sweats, unintended wt loss or wt gain, classically pleuritic or exertional cp,  orthopnea pnd or arm/hand swelling  or leg swelling, presyncope, palpitations, abdominal pain, anorexia, nausea, vomiting, diarrhea  or change in bowel habits or change in bladder habits, change in stools or change in urine, dysuria, hematuria,  rash, arthralgias, visual complaints, headache, numbness, weakness or ataxia or problems with walking or coordination,  change in mood or  memory.        Current Meds  Medication Sig   albuterol (PROVENTIL) (2.5 MG/3ML) 0.083% nebulizer solution Take  3 mLs (2.5 mg total) by nebulization every 6 (six) hours as needed for wheezing or shortness  of breath.   budesonide-formoterol (SYMBICORT) 160-4.5 MCG/ACT inhaler Take 2 puffs first thing in am and then another 2 puffs about 12 hours later.   cetirizine (ZYRTEC) 10 MG tablet Take 1 tablet (10 mg total) by mouth daily.   diclofenac Sodium (VOLTAREN) 1 % GEL Apply 2 g topically 4 (four) times daily.   famotidine (PEPCID) 20 MG tablet TAKE 1 TABLET BY MOUTH AFTER SUPPER   montelukast (SINGULAIR) 10 MG tablet TAKE 1 TABLET BY MOUTH EVERYDAY AT BEDTIME   predniSONE (DELTASONE) 10 MG tablet Take  4 each am x 2 days,   2 each am x 2 days,  1 each am x 2 days and stop   PROAIR HFA 108 (90 Base) MCG/ACT inhaler INHALE 2 PUFFS BY MOUTH EVERY 4 HOURS AS NEEDED FOR WHEEZING AND FOR SHORTNESS OF BREATH   RABEprazole (ACIPHEX) 20 MG tablet Take 1 tablet (20 mg total) by mouth daily.   tirzepatide (MOUNJARO) 7.5 MG/0.5ML Pen Inject 7.5 mg into the skin once a week.   valsartan (DIOVAN) 40 MG tablet Take 40 mg by mouth daily.                  Objective:   Physical Exam   Wts  11/12/2022     227  01/22/2022         238  06/21/2021         263  05/10/2021       261  11/30/2020     260 09/02/2020       260 03/03/2018       274  10/01/2017       260  06/22/2015          245  04/13/15 243 lb 6.4 oz (110.406 kg)  04/03/15 239 lb (108.41 kg)  03/14/15 242 lb 1.6 oz (109.816 kg)    Vital signs reviewed  11/12/2022  - Note at rest 02 sats  98% on RA   General appearance:    amb bf nad      HEENT : Oropharynx  clear        NECK :  without  apparent JVD/ palpable Nodes/TM    LUNGS: no acc muscle use,  Nl contour chest mid exp wheeze  bilaterally without cough on insp or exp maneuvers   CV:  RRR  no s3 or murmur or increase in P2, and no edema   ABD:  soft and nontender with nl inspiratory excursion in the supine position. No bruits or organomegaly appreciated   MS:  Nl gait/ ext warm without  deformities Or obvious joint restrictions  calf tenderness, cyanosis or clubbing    SKIN: warm and dry without lesions    NEURO:  alert, approp, nl sensorium with  no motor or cerebellar deficits apparent.     Assessment & Plan:

## 2022-11-12 NOTE — Assessment & Plan Note (Signed)
Onset in childhood  - 11/30/2020  After extensive coaching inhaler device,  effectiveness =    75% > continue symb 160 2bid - 05/10/2021  After extensive coaching inhaler device,  effectiveness =    80% with hfa  And flaring on 80 so change to 160 and added gerd rx  - 05/10/2021   Walked on RA  x  2  lap(s) =  approx 500  ft  @ slow pace, stopped due to    Sob with mild cp  with lowest 02 sats 98%   - FENO  01/22/2022  = 111 on symb 160 2bid / singulair > rx pred x 6 d  11/12/2022  After extensive coaching inhaler device,  effectiveness =    80% continue symbicort 160 /singulair  Prednisone 10 mg take  4 each am x 2 days,   2 each am x 2 days,  1 each am x 2 days and stop   Zpak   F/u in 3 m with all meds in hand using a trust but verify approach to confirm accurate Medication  Reconciliation The principal here is that until we are certain that the  patients are doing what we've asked, it makes no sense to ask them to do more.          Each maintenance medication was reviewed in detail including emphasizing most importantly the difference between maintenance and prns and under what circumstances the prns are to be triggered using an action plan format where appropriate.  Total time for H and P, chart review, counseling, reviewing hfa device(s) and generating customized AVS unique to this office visit / same day charting = 20 min

## 2022-11-12 NOTE — Patient Instructions (Signed)
Whenever having cough and congestion:  Pepcid 20 mg twice daily = after bfast and supper  GERD (REFLUX)  is an extremely common cause of respiratory symptoms just like yours , many times with no obvious heartburn at all.    It can be treated with medication, but also with lifestyle changes including elevation of the head of your bed (ideally with 6 -8inch blocks under the headboard of your bed),  Smoking cessation, avoidance of late meals, excessive alcohol, and avoid fatty foods, chocolate, peppermint, colas, red wine, and acidic juices such as orange juice.  NO MINT OR MENTHOL PRODUCTS SO NO COUGH DROPS  USE SUGARLESS CANDY INSTEAD (Jolley ranchers or Stover's or Life Savers) or even ice chips will also do - the key is to swallow to prevent all throat clearing. NO OIL BASED VITAMINS - use powdered substitutes.  Avoid fish oil when coughing.    Prednisone 10 mg take  4 each am x 2 days,   2 each am x 2 days,  1 each am x 2 days and stop   Zpak   Please schedule a follow up visit in 3 months but call sooner if needed  with all medications /inhalers/ solutions in hand so we can verify exactly what you are taking. This includes all medications from all doctors and over the counters

## 2023-08-28 ENCOUNTER — Other Ambulatory Visit: Payer: Self-pay | Admitting: Internal Medicine

## 2023-08-28 DIAGNOSIS — R0609 Other forms of dyspnea: Secondary | ICD-10-CM

## 2023-08-28 NOTE — Telephone Encounter (Signed)
 Copied from CRM [id number]. Topic: Clinical - Medication Refill >> Aug 28, 2023 11:21 AM Celestine FALCON wrote: Medication: budesonide -formoterol  (SYMBICORT ) 160-4.5 MCG/ACT inhaler  montelukast  (SINGULAIR ) 10 MG tablet  famotidine  (PEPCID ) 20 MG tablet  cetirizine  (ZYRTEC ) 10 MG tablet  Has the patient contacted their pharmacy? Yes; pt changed pharmacy as well as needing new prescription with refills. (Agent: If no, request that the patient contact the pharmacy for the refill. If patient does not wish to contact the pharmacy document the reason why and proceed with request.) (Agent: If yes, when and what did the pharmacy advise?)  This is the patient's preferred pharmacy:  [facility] [id number] - [place], [place] - [address] [place] [address] Phone: [phone] Fax: [phone]  Is this the correct pharmacy for this prescription? Yes If no, delete pharmacy and type the correct one.   Has the prescription been filled recently? No  Is the patient out of the medication? Yes  Has the patient been seen for an appointment in the last year OR does the patient have an upcoming appointment? Yes  Can we respond through MyChart? Yes  Agent: Please be advised that Rx refills may take up to 3 business days. We ask that you follow-up with your pharmacy.

## 2023-08-29 MED ORDER — FAMOTIDINE 20 MG PO TABS: ORAL_TABLET | ORAL | 0 refills | Status: AC

## 2023-08-29 MED ORDER — MONTELUKAST SODIUM 10 MG PO TABS: 10.0000 mg | ORAL_TABLET | Freq: Every day | ORAL | 3 refills | Status: AC

## 2023-08-29 MED ORDER — BUDESONIDE-FORMOTEROL FUMARATE 160-4.5 MCG/ACT IN AERO: INHALATION_SPRAY | RESPIRATORY_TRACT | 12 refills | Status: DC

## 2023-10-14 NOTE — Progress Notes (Unsigned)
 Subjective:   Patient ID: [name], female    DOB: [birth year],   MRN: [id number]    Brief patient profile:  43 yobf with asthma since age 43 on maint rx ever since then quit smoking 2004 and no better intially seen in pulmonary clinic by Dr Linard early 2000s followed at [facility] fm practice/ ER with dtca asthma she attributed to allergies self referred to Pulmonary clinic 04/13/2015 p last ov:   04/13/2015 [facility] office visit/ Mildreth Reek  maint rx symbicort  160 2 bid Chief Complaint  Patient presents with   Pulmonary Consult    Self referral Asthma- former pt, last seen 2013  while on maint on symbicort  160 2bid but two er trips both p exp to dogs while living at [employer] house but getting ready to move.  symbicort  is 90 dollars/ proair  is 45 and uses maybe one every 6 m and used 3 x then to ER  Off symb x 3 days  Overt HB on bcps rec Plan A = Automatic = Symbicort  160 Take 2 puffs first thing in am and then another 2 puffs about 12 hours later                                     Singulair  10 mg each day  Plan B = Backup Only use your albuterol  as a rescue medication  Work on maintaining perfect inhaler technique:   Continue pepcid  20 mg at bedtime and as needed during the day GERD diet reviewed, bed blocks rec     09/02/2020  re-establish ov/Nur Rabold re: asthma in pt nursing maint on symbicort /singulair  s/p IUP 10/2019  @ 270  Chief Complaint  Patient presents with   Follow-up    Patient reports that she has had some increased shortness of breath with exertion x 6 months,   Dyspnea:  flat surface slow ok Cough: worse on bad breathing days  Sleeping: on 30 degrees tempurpedic SABA use: 1-2 x per day - no longer neb  02: none Covid status:   vax x none  had it while pregnant summer 2021  Rec Plan A = Automatic = Always=   Symbicort  160 Take 2 puffs first thing in am and then another 2 puffs about 12 hours later.  Work on inhaler technique:   Plan B = Backup (to  supplement plan A, not to replace it) Only use your albuterol  inhaler as a rescue medication Prednisone  10 mg take  4 each am x 2 days,   2 each am x 2 days,  1 each am x 2 days and stop  Please schedule a follow up visit in 3 months but call sooner if needed or go to community wellness center (ask for Dr Brien)        01/22/2022  f/u ov/Michae Grimley re: asthma/ noct cp x years  maint on symbicort  160 / singulair  / ? Pepcid  10 only  Chief Complaint  Patient presents with   Follow-up    SOB and waking up with chest pain about 3 times a week.  Sx x 2 1/2 months  Dyspnea:  ? More tired than sob / no ex cp/cp burning quality  only happens noct midline s radiation/ s nausea/ pattern comes and goes x years. Cough: varies, esp in am / mucoid / does not wake up prematurely  Sleeping: on side flat bed  SABA use: not used  ton day of ov  / rarely need neb  02: none  Covid status:   never vax  Rec Change pepcid  to 20 mg after bfast and supper if you can't get aciphex  Ideally would like you to start: Aciphex  20 mg Take 30-60 min before first meal of the day and pepcid  20 mg an hour before bed. GERD diet reviewed, bed blocks rec  Set up follow up with Dr Rexanne next available  Please schedule a follow up visit in 3 months but call sooner if needed - bring your medications   Late add: due to high FEN0 :  Prednisone  10 mg take  4 each am x 2 days,   2 each am x 2 days,  1 each am x 2 days and stop    11/12/2022  f/u ov/Kaytie Ratcliffe re: asthma   maint on symbicort  160  did not  bring all  meds Chief Complaint  Patient presents with   Nasal Congestion    Patient states she went to [place]  in September, and was sick. Pt states she has had congestion ever since. Patient states she has had coughing and wheezing. Patient states she is coughing up yellow mucous    Dyspnea:  still able to do housework despite flare  Cough: throughout day and some noct  Sleeping: 30 degree HObed on side with freq awakening since sep  flair with URI  SABA use: twice daily  02: none  Rec    10/16/2023  f/u ov/Lavette Yankovich re: asthma  maint on ***  No chief complaint on file.   Dyspnea:  *** Cough: *** Sleeping: *** resp cc  SABA use: *** 02: ***  Lung cancer screening :  ***    No obvious day to day or daytime variability or assoc excess/ purulent sputum or mucus plugs or hemoptysis or cp or chest tightness, subjective wheeze or overt sinus or hb symptoms.    Also denies any obvious fluctuation of symptoms with weather or environmental changes or other aggravating or alleviating factors except as outlined above   No unusual exposure hx or h/o childhood pna/ asthma or knowledge of premature birth.  Current Allergies, Complete Past Medical History, Past Surgical History, Family History, and Social History were reviewed in [employer] record.  ROS  The following are not active complaints unless bolded Hoarseness, sore throat, dysphagia, dental problems, itching, sneezing,  nasal congestion or discharge of excess mucus or purulent secretions, ear ache,   fever, chills, sweats, unintended wt loss or wt gain, classically pleuritic or exertional cp,  orthopnea pnd or arm/hand swelling  or leg swelling, presyncope, palpitations, abdominal pain, anorexia, nausea, vomiting, diarrhea  or change in bowel habits or change in bladder habits, change in stools or change in urine, dysuria, hematuria,  rash, arthralgias, visual complaints, headache, numbness, weakness or ataxia or problems with walking or coordination,  change in mood or  memory.        No outpatient medications have been marked as taking for the 10/16/23 encounter (Appointment) with Darlean Ozell NOVAK, MD.                Objective:   Physical Exam   Wts   10/16/2023       ***  11/12/2022     227  01/22/2022         238  06/21/2021         263  05/10/2021       261  11/30/2020  260 09/02/2020       260 03/03/2018       274  10/01/2017        260  06/22/2015          245  04/13/15 243 lb 6.4 oz (110.406 kg)  04/03/15 239 lb (108.41 kg)  03/14/15 242 lb 1.6 oz (109.816 kg)     Vital signs reviewed  10/16/2023  - No yobf with asthma since age 43 on maint rx ever since then quit smoking 2004 and no better intially seen in pulmonary clinic by Dr Linard early 2000s followed at [facility] fm practice/ ER with dtca asthma she attributed to allergies self referred to Pulmonary clinic 04/13/2015 p last ov:   04/13/2015 [facility] office visit/ Mildreth Reek  maint rx symbicort  160 2 bid Chief Complaint  Patient presents with   Pulmonary Consult    Self referral Asthma- former pt, last seen 2013  while on maint on symbicort  160 2bid but two er trips both p exp to dogs while living at [employer] house but getting ready to move.  symbicort  is 90 dollars/ proair  is 45 and uses maybe one every 6 m and used 3 x then to ER  Off symb x 3 days  Overt HB on bcps rec Plan A = Automatic = Symbicort  160 Take 2 puffs first thing in am and then another 2 puffs about 12 hours later                                     Singulair  10 mg each day  Plan B = Backup Only use your albuterol  as a rescue medication  Work on maintaining perfect inhaler technique:   Continue pepcid  20 mg at bedtime and as needed during the day GERD diet reviewed, bed blocks rec     09/02/2020  re-establish ov/Nur Rabold re: asthma in pt nursing maint on symbicort /singulair  s/p IUP 10/2019  @ 270  Chief Complaint  Patient presents with   Follow-up    Patient reports that she has had some increased shortness of breath with exertion x 6 months,   Dyspnea:  flat surface slow ok Cough: worse on bad breathing days  Sleeping: on 30 degrees tempurpedic SABA use: 1-2 x per day - no longer neb  02: none Covid status:   vax x none  had it while pregnant summer 2021  Rec Plan A = Automatic = Always=   Symbicort  160 Take 2 puffs first thing in am and then another 2 puffs about 12 hours later.  Work on inhaler technique:   Plan B = Backup (to  supplement plan A, not to replace it) Only use your albuterol  inhaler as a rescue medication Prednisone  10 mg take  4 each am x 2 days,   2 each am x 2 days,  1 each am x 2 days and stop  Please schedule a follow up visit in 3 months but call sooner if needed or go to community wellness center (ask for Dr Brien)        01/22/2022  f/u ov/Michae Grimley re: asthma/ noct cp x years  maint on symbicort  160 / singulair  / ? Pepcid  10 only  Chief Complaint  Patient presents with   Follow-up    SOB and waking up with chest pain about 3 times a week.  Sx x 2 1/2 months  Dyspnea:  ? More tired than sob / no ex cp/cp burning quality  only happens noct midline s radiation/ s nausea/ pattern comes and goes x years. Cough: varies, esp in am / mucoid / does not wake up prematurely  Sleeping: on side flat bed  SABA use: not used  ton day of ov  / rarely need neb  02: none  Covid status:   never vax  Rec Change pepcid  to 20 mg after bfast and supper if you can't get aciphex  Ideally would like you to start: Aciphex  20 mg Take 30-60 min before first meal of the day and pepcid  20 mg an hour before bed. GERD diet reviewed, bed blocks rec  Set up follow up with Dr Rexanne next available  Please schedule a follow up visit in 3 months but call sooner if needed - bring your medications   Late add: due to high FEN0 :  Prednisone  10 mg take  4 each am x 2 days,   2 each am x 2 days,  1 each am x 2 days and stop    11/12/2022  f/u ov/Kaytie Ratcliffe re: asthma   maint on symbicort  160  did not  bring all  meds Chief Complaint  Patient presents with   Nasal Congestion    Patient states she went to [place]  in September, and was sick. Pt states she has had congestion ever since. Patient states she has had coughing and wheezing. Patient states she is coughing up yellow mucous    Dyspnea:  still able to do housework despite flare  Cough: throughout day and some noct  Sleeping: 30 degree HObed on side with freq awakening since sep  flair with URI  SABA use: twice daily  02: none  Rec    10/16/2023  f/u ov/Lavette Yankovich re: asthma  maint on ***  No chief complaint on file.   Dyspnea:  *** Cough: *** Sleeping: *** resp cc  SABA use: *** 02: ***  Lung cancer screening :  ***    No obvious day to day or daytime variability or assoc excess/ purulent sputum or mucus plugs or hemoptysis or cp or chest tightness, subjective wheeze or overt sinus or hb symptoms.    Also denies any obvious fluctuation of symptoms with weather or environmental changes or other aggravating or alleviating factors except as outlined above   No unusual exposure hx or h/o childhood pna/ asthma or knowledge of premature birth.  Current Allergies, Complete Past Medical History, Past Surgical History, Family History, and Social History were reviewed in [employer] record.  ROS  The following are not active complaints unless bolded Hoarseness, sore throat, dysphagia, dental problems, itching, sneezing,  nasal congestion or discharge of excess mucus or purulent secretions, ear ache,   fever, chills, sweats, unintended wt loss or wt gain, classically pleuritic or exertional cp,  orthopnea pnd or arm/hand swelling  or leg swelling, presyncope, palpitations, abdominal pain, anorexia, nausea, vomiting, diarrhea  or change in bowel habits or change in bladder habits, change in stools or change in urine, dysuria, hematuria,  rash, arthralgias, visual complaints, headache, numbness, weakness or ataxia or problems with walking or coordination,  change in mood or  memory.        No outpatient medications have been marked as taking for the 10/16/23 encounter (Appointment) with Darlean Ozell NOVAK, MD.                Objective:   Physical Exam   Wts   10/16/2023       ***  11/12/2022     227  01/22/2022         238  06/21/2021         263  05/10/2021       261  11/30/2020  260 09/02/2020       260 03/03/2018       274  10/01/2017        260  06/22/2015          245  04/13/15 243 lb 6.4 oz (110.406 kg)  04/03/15 239 lb (108.41 kg)  03/14/15 242 lb 1.6 oz (109.816 kg)     Vital signs reviewed  10/16/2023  - Note at rest 02 sats  ***% on ***   General appearance:    ***    mid exp wheeze  bilaterally***      Assessment & Plan:

## 2023-10-16 ENCOUNTER — Encounter: Payer: Self-pay | Admitting: Internal Medicine

## 2023-10-16 ENCOUNTER — Ambulatory Visit: Admitting: Internal Medicine

## 2023-10-16 VITALS — BP 124/64 | HR 68 | Temp 98.6°F | Ht 66.0 in | Wt 246.4 lb

## 2023-10-16 DIAGNOSIS — J45909 Unspecified asthma, uncomplicated: Secondary | ICD-10-CM

## 2023-10-16 LAB — NITRIC OXIDE: Nitric Oxide: 52

## 2023-10-16 MED ORDER — BUDESONIDE-FORMOTEROL FUMARATE 80-4.5 MCG/ACT IN AERO: INHALATION_SPRAY | RESPIRATORY_TRACT | 12 refills | Status: AC

## 2023-10-16 NOTE — Assessment & Plan Note (Addendum)
 Onset in childhood  - 11/30/2020  After extensive coaching inhaler device,  effectiveness =    75% > continue symb 160 2bid - 05/10/2021  After extensive coaching inhaler device,  effectiveness =    80% with hfa  And flaring on 80 so change to 160 and added gerd rx  - 05/10/2021   Walked on RA  x  2  lap(s) =  approx 500  ft  @ slow pace, stopped due to    Sob with mild cp  with lowest 02 sats 98%  - 01/22/2022  After extensive coaching inhaler device,  effectiveness =    80%  - FENO  01/22/2022  = 111 on symb 160 2bid / singulair  > rx pred x 6 d   - 10/16/2023  After extensive coaching inhaler device,  effectiveness =    90%  hfa > try symb 80 and allergy  eval   - FENO   10/16/2023   = 52 on symb 160/singulair  and pt wanting to do allergy  eval / ? Get dog   >>>  ok to try the symb 80 with low threshold to immediately start back on the 160 strength /  allergy  eval for another opinion about alternatives to ICS or ways to minimize them  At present: All goals of chronic asthma control met including optimal function and elimination of symptoms with minimal need for rescue therapy.  Contingencies discussed in full including contacting this office immediately if not controlling the symptoms using the rule of two's.     F/u yearly , sooner prn      Each maintenance medication was reviewed in detail including emphasizing most importantly the difference between maintenance and prns and under what circumstances the prns are to be triggered using an action plan format where appropriate.  Total time for H and P, chart review, counseling, reviewing hfa device(s) and generating customized AVS unique to this office visit / same day charting = 32 min

## 2023-10-16 NOTE — Patient Instructions (Signed)
 Ok to try symbicort  80 Take 2 puffs first thing in am and then another 2 puffs about 12 hours later.     If any worse breathing or increased need for albuterol , resume the higher dose of symbicort    My office will be contacting you by phone for referral to allergy    - if you don't hear back from my office within one week please call us  back or notify us  thru MyChart and we'll address it right away.   Please schedule a follow up visit in 12 months but call sooner if needed

## 2024-01-21 ENCOUNTER — Other Ambulatory Visit: Payer: Self-pay

## 2024-01-21 ENCOUNTER — Ambulatory Visit (HOSPITAL_COMMUNITY)
Admission: EM | Admit: 2024-01-21 | Discharge: 2024-01-21 | Disposition: A | Discharge status: Home / Self Care | Payer: Self-pay

## 2024-01-21 ENCOUNTER — Encounter (HOSPITAL_COMMUNITY): Payer: Self-pay

## 2024-01-21 ENCOUNTER — Inpatient Hospital Stay (HOSPITAL_BASED_OUTPATIENT_CLINIC_OR_DEPARTMENT_OTHER)
Admission: EM | Admit: 2024-01-21 | Discharge: 2024-01-23 | DRG: 064 | Disposition: A | Discharge status: Home / Self Care | Payer: MEDICAID | Attending: Family Medicine | Admitting: Family Medicine

## 2024-01-21 ENCOUNTER — Emergency Department (HOSPITAL_BASED_OUTPATIENT_CLINIC_OR_DEPARTMENT_OTHER): Payer: MEDICAID

## 2024-01-21 ENCOUNTER — Encounter (HOSPITAL_BASED_OUTPATIENT_CLINIC_OR_DEPARTMENT_OTHER): Payer: Self-pay | Admitting: Emergency Medicine

## 2024-01-21 DIAGNOSIS — Z87891 Personal history of nicotine dependence: Secondary | ICD-10-CM

## 2024-01-21 DIAGNOSIS — I7774 Dissection of vertebral artery: Secondary | ICD-10-CM | POA: Diagnosis present

## 2024-01-21 DIAGNOSIS — R531 Weakness: Secondary | ICD-10-CM

## 2024-01-21 DIAGNOSIS — Z833 Family history of diabetes mellitus: Secondary | ICD-10-CM

## 2024-01-21 DIAGNOSIS — I639 Cerebral infarction, unspecified: Secondary | ICD-10-CM | POA: Diagnosis present

## 2024-01-21 DIAGNOSIS — Z7951 Long term (current) use of inhaled steroids: Secondary | ICD-10-CM

## 2024-01-21 DIAGNOSIS — Z8249 Family history of ischemic heart disease and other diseases of the circulatory system: Secondary | ICD-10-CM

## 2024-01-21 DIAGNOSIS — I63542 Cerebral infarction due to unspecified occlusion or stenosis of left cerebellar artery: Principal | ICD-10-CM | POA: Diagnosis present

## 2024-01-21 DIAGNOSIS — Z825 Family history of asthma and other chronic lower respiratory diseases: Secondary | ICD-10-CM

## 2024-01-21 DIAGNOSIS — I7770 Dissection of unspecified artery: Secondary | ICD-10-CM | POA: Diagnosis present

## 2024-01-21 DIAGNOSIS — E66812 Obesity, class 2: Secondary | ICD-10-CM | POA: Diagnosis present

## 2024-01-21 DIAGNOSIS — R519 Headache, unspecified: Secondary | ICD-10-CM

## 2024-01-21 DIAGNOSIS — K219 Gastro-esophageal reflux disease without esophagitis: Secondary | ICD-10-CM | POA: Diagnosis present

## 2024-01-21 DIAGNOSIS — E785 Hyperlipidemia, unspecified: Secondary | ICD-10-CM | POA: Diagnosis present

## 2024-01-21 DIAGNOSIS — K279 Peptic ulcer, site unspecified, unspecified as acute or chronic, without hemorrhage or perforation: Secondary | ICD-10-CM | POA: Diagnosis present

## 2024-01-21 DIAGNOSIS — Z79899 Other long term (current) drug therapy: Secondary | ICD-10-CM

## 2024-01-21 DIAGNOSIS — G8194 Hemiplegia, unspecified affecting left nondominant side: Secondary | ICD-10-CM | POA: Diagnosis present

## 2024-01-21 DIAGNOSIS — Z83438 Family history of other disorder of lipoprotein metabolism and other lipidemia: Secondary | ICD-10-CM

## 2024-01-21 DIAGNOSIS — R29701 NIHSS score 1: Secondary | ICD-10-CM | POA: Diagnosis present

## 2024-01-21 DIAGNOSIS — J45909 Unspecified asthma, uncomplicated: Secondary | ICD-10-CM | POA: Diagnosis present

## 2024-01-21 DIAGNOSIS — Z8711 Personal history of peptic ulcer disease: Secondary | ICD-10-CM

## 2024-01-21 DIAGNOSIS — I10 Essential (primary) hypertension: Secondary | ICD-10-CM | POA: Diagnosis present

## 2024-01-21 DIAGNOSIS — R29810 Facial weakness: Secondary | ICD-10-CM | POA: Diagnosis present

## 2024-01-21 DIAGNOSIS — Z888 Allergy status to other drugs, medicaments and biological substances status: Secondary | ICD-10-CM

## 2024-01-21 DIAGNOSIS — Z6837 Body mass index (BMI) 37.0-37.9, adult: Secondary | ICD-10-CM

## 2024-01-21 DIAGNOSIS — Z7982 Long term (current) use of aspirin: Secondary | ICD-10-CM

## 2024-01-21 LAB — DIFFERENTIAL
Abs Immature Granulocytes: 0.02 K/uL (ref 0.00–0.07)
Basophils Absolute: 0.1 K/uL (ref 0.0–0.1)
Basophils Relative: 1 %
Eosinophils Absolute: 0.3 K/uL (ref 0.0–0.5)
Eosinophils Relative: 4 %
Immature Granulocytes: 0 %
Lymphocytes Relative: 23 %
Lymphs Abs: 1.6 K/uL (ref 0.7–4.0)
Monocytes Absolute: 0.4 K/uL (ref 0.1–1.0)
Monocytes Relative: 5 %
Neutro Abs: 4.5 K/uL (ref 1.7–7.7)
Neutrophils Relative %: 67 %

## 2024-01-21 LAB — ETHANOL: Alcohol, Ethyl (B): <15 mg/dL

## 2024-01-21 LAB — CBC
HCT: 35.6 % — ABNORMAL LOW (ref 36.0–46.0)
Hemoglobin: 11.4 g/dL — ABNORMAL LOW (ref 12.0–15.0)
MCH: 25.9 pg — ABNORMAL LOW (ref 26.0–34.0)
MCHC: 32 g/dL (ref 30.0–36.0)
MCV: 80.7 fL (ref 80.0–100.0)
Platelets: 368 K/uL (ref 150–400)
RBC: 4.41 MIL/uL (ref 3.87–5.11)
RDW: 14.8 % (ref 11.5–15.5)
WBC: 6.7 K/uL (ref 4.0–10.5)
nRBC: 0 % (ref 0.0–0.2)

## 2024-01-21 LAB — COMPREHENSIVE METABOLIC PANEL WITH GFR
ALT: 9 U/L (ref 0–44)
AST: 16 U/L (ref 15–41)
Albumin: 4.3 g/dL (ref 3.5–5.0)
Alkaline Phosphatase: 66 U/L (ref 38–126)
Anion gap: 11 (ref 5–15)
BUN: 15 mg/dL (ref 6–20)
CO2: 26 mmol/L (ref 22–32)
Calcium: 9.5 mg/dL (ref 8.9–10.3)
Chloride: 103 mmol/L (ref 98–111)
Creatinine, Ser: 0.85 mg/dL (ref 0.44–1.00)
GFR, Estimated: >60 mL/min
Glucose, Bld: 111 mg/dL — ABNORMAL HIGH (ref 70–99)
Potassium: 3.9 mmol/L (ref 3.5–5.1)
Sodium: 139 mmol/L (ref 135–145)
Total Bilirubin: <0.2 mg/dL (ref 0.0–1.2)
Total Protein: 7.6 g/dL (ref 6.5–8.1)

## 2024-01-21 LAB — URINE DRUG SCREEN
Amphetamines: NEGATIVE
Barbiturates: NEGATIVE
Benzodiazepines: NEGATIVE
Cocaine: NEGATIVE
Fentanyl: NEGATIVE
Methadone Scn, Ur: NEGATIVE
Opiates: NEGATIVE
Tetrahydrocannabinol: NEGATIVE

## 2024-01-21 LAB — CBG MONITORING, ED: Glucose-Capillary: 126 mg/dL — ABNORMAL HIGH (ref 70–99)

## 2024-01-21 LAB — PREGNANCY, URINE: Preg Test, Ur: NEGATIVE

## 2024-01-21 LAB — PROTIME-INR
INR: 1 (ref 0.8–1.2)
Prothrombin Time: 13.2 s (ref 11.4–15.2)

## 2024-01-21 LAB — APTT: aPTT: 30 s (ref 24–36)

## 2024-01-21 MED ORDER — DIPHENHYDRAMINE HCL 25 MG PO CAPS
50.0000 mg | ORAL_CAPSULE | Freq: Once | ORAL | Status: AC
  Administered 2024-01-22: 50 mg via ORAL
  Filled 2024-01-21: qty 2

## 2024-01-21 MED ORDER — DIPHENHYDRAMINE HCL 50 MG/ML IJ SOLN: 50.0000 mg | Freq: Once | INTRAMUSCULAR | Status: AC

## 2024-01-21 MED ORDER — METHYLPREDNISOLONE SODIUM SUCC 40 MG IJ SOLR
40.0000 mg | Freq: Once | INTRAMUSCULAR | Status: AC
  Administered 2024-01-21: 40 mg via INTRAVENOUS
  Filled 2024-01-21: qty 1

## 2024-01-21 MED ORDER — KETOROLAC TROMETHAMINE 15 MG/ML IJ SOLN
15.0000 mg | Freq: Once | INTRAMUSCULAR | Status: AC
  Administered 2024-01-21: 15 mg via INTRAVENOUS
  Filled 2024-01-21: qty 1

## 2024-01-21 MED ORDER — PROCHLORPERAZINE MALEATE 5 MG PO TABS
5.0000 mg | ORAL_TABLET | Freq: Once | ORAL | Status: DC
  Filled 2024-01-21: qty 1

## 2024-01-21 MED ORDER — SODIUM CHLORIDE 0.9% FLUSH
3.0000 mL | Freq: Once | INTRAVENOUS | Status: AC
  Administered 2024-01-21: 3 mL via INTRAVENOUS
  Filled 2024-01-21: qty 3

## 2024-01-21 NOTE — ED Notes (Signed)
 Assumed care of this pt form previous RN at this time.

## 2024-01-21 NOTE — Plan of Care (Signed)
 On-call neurology note  Called by Dr. Patsey at [facility], 44 year old with sudden onset of headache with some left-sided sensory deficits and inconsistent exam.  Needs further imaging for which she is being admitted to the hospitalist.  I would recommend treatment with migraine cocktail as well as MR imaging of the brain without contrast and MR angiogram head and neck and MR brain without contrast since she has contrast allergy .  Due to the fact that there are long wait times to get into the hospital from the freestanding ER-I would recommend CT contrast allergy  protocol medications to be given so that a CT venogram and a CT angiogram head and neck can be done at the outside center to rule out dissection/venous sinus thrombus in case she does not have a bed at [facility] for a prolonged period of time.  Please call neurology if the MRI shows an acute stroke on MRI or if there are concerns for dissection or dural venous sinus thrombus.  Plan discussed with Dr. Patsey and admitting Dr. Sundil   Addendum 01/22/2024 2:24 AM I was called by Dr. Delaney angiogram head and neck and CT venogram of brain completed.  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  No evidence of dural venous sinus thrombus on CT venogram. I recommend loading her with aspirin  and Plavix .  Medicine was ordered but likely not given prior to transferring the patient.  She is currently off the floor and on the way to the hospital. She will need MRI of the brain without contrast.  I would also recommend MR angiogram of the head without contrast and neck with contrast to confirm the findings on the CT angiography. Neurology will see the patient once he arrives at [facility]. This was communicated to Dr. Raford and the admitting Dr. Lee   -- Eligio Lav, MD Neurologist [facility], [facility]

## 2024-01-21 NOTE — ED Triage Notes (Signed)
 Patient states she has had left arm weakness since 8-0 this AM. Patient states she had an excruciating headache. Left side weakness in her left arm and left leg.  Patient states she leans to the left and has nausea and dizziness,

## 2024-01-21 NOTE — ED Triage Notes (Signed)
 [facility]

## 2024-01-21 NOTE — ED Provider Notes (Signed)
 " [facility]    CSN: [id number] Arrival date & time: 01/21/24  1631      History   Chief Complaint Chief Complaint  Patient presents with   Numbness   Headache   Nausea   Dizziness    HPI [name] is a 44 y.o. female.   Patient presents to clinic over concern of a bad headache that woke her up this morning.  Around 8 or 9 AM she noticed left arm weakness and left leg weakness.  She feels a tingling sensation in the left side of her body and has had difficulty ambulating.  She has also been nauseous and dizzy.  The history is provided by the patient and medical records.  Headache Dizziness   Past Medical History:  Diagnosis Date   Asthma     reservations to 2-3 timmes  a year that required ED visits/hospitalizations. Never intubated   GERD (gastroesophageal reflux disease)    Seasonal allergies     Patient Active Problem List   Diagnosis Date Noted   DOE (dyspnea on exertion) 05/10/2021   Hypertension 05/24/2020   Peptic ulcer disease 02/01/2019   Chronic low back pain 09/10/2016   Skin rash 03/27/2011   Asthma 06/22/2010   Morbid obesity with BMI of 40.0-44.9, adult (HCC) 05/24/2006    Past Surgical History:  Procedure Laterality Date   CESAREAN SECTION     CESAREAN SECTION N/A 10/25/2012   Procedure: CESAREAN SECTION;  Surgeon: Rexene PARAS. Rosalva, MD;  Location: [facility];  Service: Obstetrics;  Laterality: N/A;   CESAREAN SECTION N/A 03/05/2014   Procedure: CESAREAN SECTION;  Surgeon: Jon CINDERELLA Rummer, MD;  Location: [facility];  Service: Obstetrics;  Laterality: N/A;   CESAREAN SECTION WITH BILATERAL TUBAL LIGATION N/A 11/06/2019   Procedure: CESAREAN SECTION WITH BILATERAL TUBAL LIGATION;  Surgeon: Armond Cape, MD;  Location: [facility];  Service: Obstetrics;  Laterality: N/A;   CHOLECYSTECTOMY N/A 04/09/2017   Procedure: LAPAROSCOPIC CHOLECYSTECTOMY;  Surgeon: Rubin Calamity, MD;  Location: [facility];  Service: General;  Laterality: N/A;   FRACTURE  SURGERY     left middle finger    OB History     Gravida  8   Para  4   Term  4   Preterm  0   AB  4   Living  4      SAB  2   IAB  2   Ectopic  0   Multiple  0   Live Births  4            Home Medications    Prior to Admission medications  Medication Sig Start Date End Date Taking? Authorizing Provider  albuterol  (PROVENTIL ) (2.5 MG/3ML) 0.083% nebulizer solution Take 3 mLs (2.5 mg total) by nebulization every 6 (six) hours as needed for wheezing or shortness of breath. 05/10/21   Darlean Ozell NOVAK, MD  budesonide -formoterol  (SYMBICORT ) 80-4.5 MCG/ACT inhaler Take 2 puffs first thing in am and then another 2 puffs about 12 hours later. 10/16/23   Darlean Ozell NOVAK, MD  cetirizine  (ZYRTEC ) 10 MG tablet Take 1 tablet (10 mg total) by mouth daily. 07/14/20   Donah Laymon PARAS, MD  diclofenac  Sodium (VOLTAREN ) 1 % GEL Apply 2 g topically 4 (four) times daily. 05/24/20   Donah Laymon PARAS, MD  famotidine  (PEPCID ) 20 MG tablet TAKE 1 TABLET BY MOUTH AFTER SUPPER 08/29/23   Wert, Michael B, MD  montelukast  (SINGULAIR ) 10 MG tablet Take 1 tablet (  10 mg total) by mouth at bedtime. 08/29/23   Darlean Ozell NOVAK, MD  PROAIR  HFA 108 (90 Base) MCG/ACT inhaler INHALE 2 PUFFS BY MOUTH EVERY 4 HOURS AS NEEDED FOR WHEEZING AND FOR SHORTNESS OF BREATH 08/08/20   Donah Laymon PARAS, MD  valsartan (DIOVAN) 40 MG tablet Take 40 mg by mouth daily.    [provider]    Family History Family History  Problem Relation Age of Onset   Hypertension Mother    Allergies Mother    Sleep apnea Father    Hyperlipidemia Father    Allergies Maternal Grandmother    Asthma Maternal Grandmother    Diabetes Maternal Grandmother     Social History Social History[1]   Allergies   Iohexol  and Moxifloxacin   Review of Systems Review of Systems  Per HPI  Physical Exam Triage Vital Signs ED Triage Vitals  Encounter Vitals Group     BP 01/21/24 1638 (!) 170/104     Girls  Systolic BP Percentile --      Girls Diastolic BP Percentile --      Boys Systolic BP Percentile --      Boys Diastolic BP Percentile --      Pulse Rate 01/21/24 1638 69     Resp 01/21/24 1638 14     Temp 01/21/24 1638 98.7 F (37.1 C)     Temp Source 01/21/24 1638 Oral     SpO2 01/21/24 1638 99 %     Weight --      Height --      Head Circumference --      Peak Flow --      Pain Score 01/21/24 1641 8     Pain Loc --      Pain Education --      Exclude from Growth Chart --    No data found.  Updated Vital Signs BP (!) 170/104 (BP Location: Right Arm)   Pulse 69   Temp 98.7 F (37.1 C) (Oral)   Resp 14   SpO2 99%   Visual Acuity Right Eye Distance:   Left Eye Distance:   Bilateral Distance:    Right Eye Near:   Left Eye Near:    Bilateral Near:     Physical Exam   UC Treatments / Results  Labs (all labs ordered are listed, but only abnormal results are displayed) Labs Reviewed - No data to display  EKG   Radiology No results found.  Procedures Procedures (including critical care time)  Medications Ordered in UC Medications - No data to display  Initial Impression / Assessment and Plan / UC Course  I have reviewed the triage vital signs and the nursing notes.  Pertinent labs & imaging results that were available during my care of the patient were reviewed by me and considered in my medical decision making (see chart for details).  Vitals and triage reviewed, patient is hypertensive.  Left grip strength is significantly diminished and staff witnessed abnormal gait.  Discussed briefly limitations of urgent care and that patient would benefit from further advanced evaluation at the nearest emergency department.  Patient will head to [facility] ED via POV immediately.     Final Clinical Impressions(s) / UC Diagnoses   Final diagnoses:  Acute left-sided weakness  Bad headache   Discharge Instructions   None    ED Prescriptions   None    PDMP not  reviewed this encounter.    [1]  Social History Tobacco Use  Smoking status: Former    Current packs/day: 0.00    Average packs/day: 0.3 packs/day for 0.5 years (0.2 ttl pk-yrs)    Types: Cigarettes    Start date: 07/16/2000    Quit date: 01/15/2001    Years since quitting: 23.0   Smokeless tobacco: Never  Vaping Use   Vaping status: Never Used  Substance Use Topics   Alcohol use: No    Comment: occasionaly   Drug use: No     Dreama, Cheyne Boulden  N, FNP 01/21/24 1725  "

## 2024-01-21 NOTE — ED Notes (Signed)
 Pt transported to imaging.

## 2024-01-21 NOTE — ED Notes (Signed)
 Cbg 126 in triage.

## 2024-01-21 NOTE — ED Provider Notes (Addendum)
 " [facility] Provider Note   CSN: [id number] Arrival date & time: 01/21/24  1745     Patient presents with: Headache and Weakness   [name] is a 44 y.o. female.    Headache Associated symptoms: weakness   Weakness Associated symptoms: headaches   Patient to the headache this morning.  States was feeling fine this morning was taking care of her husband who recently had surgery and then developed headache in the front of her head.  States she developed also with left-sided weakness.  Also off balance.  States difficulty moving the left side.  No history of migraines.  Seen at urgent care and sent here.    Past Medical History:  Diagnosis Date   Asthma     reservations to 2-3 timmes  a year that required ED visits/hospitalizations. Never intubated   GERD (gastroesophageal reflux disease)    Seasonal allergies     Prior to Admission medications  Medication Sig Start Date End Date Taking? Authorizing Provider  albuterol  (PROVENTIL ) (2.5 MG/3ML) 0.083% nebulizer solution Take 3 mLs (2.5 mg total) by nebulization every 6 (six) hours as needed for wheezing or shortness of breath. 05/10/21   Darlean Ozell NOVAK, MD  budesonide -formoterol  (SYMBICORT ) 80-4.5 MCG/ACT inhaler Take 2 puffs first thing in am and then another 2 puffs about 12 hours later. 10/16/23   Darlean Ozell NOVAK, MD  cetirizine  (ZYRTEC ) 10 MG tablet Take 1 tablet (10 mg total) by mouth daily. 07/14/20   Donah Laymon PARAS, MD  diclofenac  Sodium (VOLTAREN ) 1 % GEL Apply 2 g topically 4 (four) times daily. 05/24/20   Donah Laymon PARAS, MD  famotidine  (PEPCID ) 20 MG tablet TAKE 1 TABLET BY MOUTH AFTER SUPPER 08/29/23   Wert, Michael B, MD  montelukast  (SINGULAIR ) 10 MG tablet Take 1 tablet (10 mg total) by mouth at bedtime. 08/29/23   Darlean Ozell NOVAK, MD  PROAIR  HFA 108 (90 Base) MCG/ACT inhaler INHALE 2 PUFFS BY MOUTH EVERY 4 HOURS AS NEEDED FOR WHEEZING AND FOR SHORTNESS OF  BREATH 08/08/20   Donah Laymon PARAS, MD  valsartan (DIOVAN) 40 MG tablet Take 40 mg by mouth daily.    [provider]    Allergies: Iohexol  and Moxifloxacin    Review of Systems  Neurological:  Positive for weakness and headaches.    Updated Vital Signs BP (!) 184/95   Pulse 61   Temp 98.6 F (37 C)   Resp 15   Ht 5' 6 (1.676 m)   Wt 104.3 kg   LMP 01/01/2024 (Approximate)   SpO2 99%   Breastfeeding No   BMI 37.12 kg/m   Physical Exam Vitals and nursing note reviewed.  HENT:     Head: Atraumatic.  Eyes:     Extraocular Movements: Extraocular movements intact.     Pupils: Pupils are equal, round, and reactive to light.  Cardiovascular:     Rate and Rhythm: Regular rhythm.  Abdominal:     Tenderness: There is no abdominal tenderness. There is no guarding.  Musculoskeletal:     Cervical back: Neck supple.  Neurological:     Mental Status: She is alert and oriented to person, place, and time.     Comments: Visual fields grossly intact by confrontation.  Somewhat slow to use the left side but finger-nose is intact.  States paresthesias on left side of body including the chest.  May have some mildly decreased strength on the left side but no  drift and may have decreased effort.     (all labs ordered are listed, but only abnormal results are displayed) Labs Reviewed  CBC - Abnormal; Notable for the following components:      Result Value   Hemoglobin 11.4 (*)    HCT 35.6 (*)    MCH 25.9 (*)    All other components within normal limits  COMPREHENSIVE METABOLIC PANEL WITH GFR - Abnormal; Notable for the following components:   Glucose, Bld 111 (*)    All other components within normal limits  PROTIME-INR  APTT  DIFFERENTIAL  ETHANOL  PREGNANCY, URINE  URINE DRUG SCREEN  CBG MONITORING, ED    EKG: None  Radiology: CT HEAD WO CONTRAST Result Date: 01/21/2024 EXAM: CT HEAD WITHOUT CONTRAST 01/21/2024 06:20:46 PM TECHNIQUE: CT of the head was  performed without the administration of intravenous contrast. Automated exposure control, iterative reconstruction, and/or weight based adjustment of the mA/kV was utilized to reduce the radiation dose to as low as reasonably achievable. COMPARISON: None available. CLINICAL HISTORY: Neurological deficit, acute, suspected stroke. Headache, imbalance, and decreased sensation to the left side of the body. FINDINGS: BRAIN AND VENTRICLES: There is no evidence of an acute infarct, intracranial hemorrhage, mass, midline shift, hydrocephalus, or extra-axial fluid collection. Cerebral volume is normal. ORBITS: No acute abnormality. SINUSES: No acute abnormality. SOFT TISSUES AND SKULL: No acute soft tissue abnormality. No skull fracture. IMPRESSION: 1. Negative head CT. Electronically signed by: Dasie Hamburg MD 01/21/2024 06:47 PM EST RP Workstation: [id number]     Procedures   Medications Ordered in the ED  sodium chloride  flush (NS) 0.9 % injection 3 mL (has no administration in time range)  prochlorperazine  (COMPAZINE ) tablet 5 mg (has no administration in time range)  ketorolac  (TORADOL ) 15 MG/ML injection 15 mg (has no administration in time range)                                    Medical Decision Making Amount and/or Complexity of Data Reviewed Labs: ordered. Radiology: ordered.  Risk Prescription drug management. Decision regarding hospitalization.   Patient with headache and acute neurologic deficits.  Somewhat variable exam but potential left-sided weakness and paresthesias.  Differential diagnosis does include complicated migraines.  But also causes such as intracranial hemorrhage and stroke.  Not a TNK candidate due to last normal around 8 this morning.  Not LVO positive on exam.  Will get stat noncontrast head CT.  Does have IV contrast allergy .  Initial head CT reassuring.  Blood work overall reassuring.  Now back into a room.  Still not intervention candidate.  However I think  would benefit from admission to the hospital.  Will treat as migraine now however.  Will discuss with hospitalist.  Discussed with hospitalist and neurologist.  With deficits will also get CTA.  Has dye allergy  but will premedicate.        Final diagnoses:  Acute nonintractable headache, unspecified headache type  Weakness    ED Discharge Orders     None          Patsey Lot, MD 01/21/24 TYRA    Patsey Lot, MD 01/21/24 2313  "

## 2024-01-21 NOTE — Plan of Care (Addendum)
 [facility] to [facility] telemetry bed transfer:  44 year old female past medical history of asthma and former smoker presented to emergency department complaining of headache feeling off balance specifically on the left side with decreased sensation of the left side of the body face arm and leg.   States was feeling fine this morning was taking care of her husband who recently had surgery and then developed headache in the front of her head. States she developed also with left-sided weakness. Also off balance. States difficulty moving the left side. No history of migraines. Seen at urgent care and sent here.   At presentation to ED patient found hypertensive blood pressure 184/95 otherwise hemodynamically stable.  CT head no acute intracranial finding.  Lab work, UDS unremarkable.  Negative pregnancy test.  Normal blood alcohol level.  CMP unremarkable.  CBC unremarkable.  No blood glucose level.  In the ED patient received Toradol , Compazine .  Hospitalist consulted for admission for rule out a stroke versus hemiplegic migraine. Patient has contrast allergy  which is why CTA head and neck unable to obtain.  Requested Dr. Patsey to consult neurology as patient has left-sided decree sensation with headache.  Hospitalist consulted for further evaluation management of hemiplegic migraine vs need to rule out a stroke. Update, Dr. Patsey discussed case with on-call neurology Dr. Voncile recommended to MRI to obtain to rule out a stroke at this time recommended to hold off any antiplatelet therapy until MRI shows an evidence of a stroke. Neurology recommended continue to treat headache with migraine cocktail Toradol /Benadryl /Compazine , can repeat dose in 4 to 6 hours. If ineffective, can try IV valproate 500 mg x 1 or also can try IV Solu-Medrol  500 mg x 1. If the MRI shows a stroke, neurology will see patient for formal consultation. Neurology also recommended do a CT angio head and  neck and CT venogram of the head to make sure she does not have a dissection or a dural venous sinus thrombosis.  Also per neurology: If patient is not get transferred the recommendation is - can initiate the contrast allergy  protocol in which she will get Benadryl  and steroids and can then get the CT studies. And if the patient get transferred to ER to ER the recommendation is following: MR venogram and an MR angiogram head and neck without contrast in addition to MR brain without contrast.   Updated 2:28 AM, CT venogram  showed left V3 vertebral artery which may be due to dissection given paucity. Neurology Dr. Voncile has been updated recommendation following:  Loading the patient with aspirin  and Plavix . She still needs an MRI. The occluded left V3 segment does not really correlate with left-sided weakness or numbness-headache could be from the dissection if this truly is a dissection. Further imaging with MRI brain without contrast and if possible MR angiogram of the head and neck to ensure that this is not artifactual is recommended.  Neurology will see the patient once she arrives to [facility].   Appreciate neurology input.  [facility] will assume care on arrival to accepting facility. Until arrival, care as per EDP. However, [facility] available 24/7 for questions and assistance. Check [email or web address] for on-call coverage. Nursing staff, please call [facility] Admits & Consults System-Wide number under Amion on patient's arrival so appropriate admitting provider can evaluate the pt.   Author: Antasia Haider, MD  [facility]

## 2024-01-21 NOTE — ED Triage Notes (Signed)
 Pt reports waking this AM having severe headache, feeling off balance (listing to L).  LKW 2300 01/20/24; decreased sensation to L side of body (face, arm, leg). No drift noted.

## 2024-01-21 NOTE — ED Triage Notes (Signed)
 Patient is being discharged from the Urgent Care and sent to the Emergency Department via POV . Per [place]  Dreama, NP, patient is in need of higher level of care due to possible stroke. Patient is aware and verbalizes understanding of plan of care.  Vitals:   01/21/24 1638  BP: (!) 170/104  Pulse: 69  Resp: 14  Temp: 98.7 F (37.1 C)  SpO2: 99%

## 2024-01-22 ENCOUNTER — Inpatient Hospital Stay (HOSPITAL_COMMUNITY): Payer: MEDICAID

## 2024-01-22 ENCOUNTER — Other Ambulatory Visit (HOSPITAL_COMMUNITY): Payer: Self-pay

## 2024-01-22 ENCOUNTER — Emergency Department (HOSPITAL_BASED_OUTPATIENT_CLINIC_OR_DEPARTMENT_OTHER): Payer: MEDICAID

## 2024-01-22 DIAGNOSIS — I63542 Cerebral infarction due to unspecified occlusion or stenosis of left cerebellar artery: Secondary | ICD-10-CM

## 2024-01-22 DIAGNOSIS — Z888 Allergy status to other drugs, medicaments and biological substances status: Secondary | ICD-10-CM | POA: Diagnosis not present

## 2024-01-22 DIAGNOSIS — E785 Hyperlipidemia, unspecified: Secondary | ICD-10-CM | POA: Diagnosis present

## 2024-01-22 DIAGNOSIS — Z825 Family history of asthma and other chronic lower respiratory diseases: Secondary | ICD-10-CM | POA: Diagnosis not present

## 2024-01-22 DIAGNOSIS — R29810 Facial weakness: Secondary | ICD-10-CM | POA: Diagnosis present

## 2024-01-22 DIAGNOSIS — I10 Essential (primary) hypertension: Secondary | ICD-10-CM | POA: Diagnosis present

## 2024-01-22 DIAGNOSIS — I639 Cerebral infarction, unspecified: Secondary | ICD-10-CM | POA: Diagnosis present

## 2024-01-22 DIAGNOSIS — I7774 Dissection of vertebral artery: Secondary | ICD-10-CM

## 2024-01-22 DIAGNOSIS — G8194 Hemiplegia, unspecified affecting left nondominant side: Secondary | ICD-10-CM | POA: Diagnosis present

## 2024-01-22 DIAGNOSIS — R531 Weakness: Secondary | ICD-10-CM | POA: Diagnosis present

## 2024-01-22 DIAGNOSIS — Z833 Family history of diabetes mellitus: Secondary | ICD-10-CM | POA: Diagnosis not present

## 2024-01-22 DIAGNOSIS — Z7982 Long term (current) use of aspirin: Secondary | ICD-10-CM | POA: Diagnosis not present

## 2024-01-22 DIAGNOSIS — E66812 Obesity, class 2: Secondary | ICD-10-CM | POA: Diagnosis present

## 2024-01-22 DIAGNOSIS — Z7951 Long term (current) use of inhaled steroids: Secondary | ICD-10-CM | POA: Diagnosis not present

## 2024-01-22 DIAGNOSIS — Z8249 Family history of ischemic heart disease and other diseases of the circulatory system: Secondary | ICD-10-CM | POA: Diagnosis not present

## 2024-01-22 DIAGNOSIS — Z6837 Body mass index (BMI) 37.0-37.9, adult: Secondary | ICD-10-CM | POA: Diagnosis not present

## 2024-01-22 DIAGNOSIS — R29701 NIHSS score 1: Secondary | ICD-10-CM | POA: Diagnosis present

## 2024-01-22 DIAGNOSIS — K219 Gastro-esophageal reflux disease without esophagitis: Secondary | ICD-10-CM | POA: Diagnosis present

## 2024-01-22 DIAGNOSIS — I7770 Dissection of unspecified artery: Secondary | ICD-10-CM | POA: Diagnosis present

## 2024-01-22 DIAGNOSIS — R29700 NIHSS score 0: Secondary | ICD-10-CM

## 2024-01-22 DIAGNOSIS — Z87891 Personal history of nicotine dependence: Secondary | ICD-10-CM | POA: Diagnosis not present

## 2024-01-22 DIAGNOSIS — Z83438 Family history of other disorder of lipoprotein metabolism and other lipidemia: Secondary | ICD-10-CM | POA: Diagnosis not present

## 2024-01-22 DIAGNOSIS — Z8711 Personal history of peptic ulcer disease: Secondary | ICD-10-CM | POA: Diagnosis not present

## 2024-01-22 DIAGNOSIS — J45909 Unspecified asthma, uncomplicated: Secondary | ICD-10-CM | POA: Diagnosis present

## 2024-01-22 DIAGNOSIS — I6389 Other cerebral infarction: Secondary | ICD-10-CM

## 2024-01-22 DIAGNOSIS — R519 Headache, unspecified: Secondary | ICD-10-CM

## 2024-01-22 DIAGNOSIS — Z79899 Other long term (current) drug therapy: Secondary | ICD-10-CM | POA: Diagnosis not present

## 2024-01-22 LAB — ECHOCARDIOGRAM COMPLETE
AR max vel: 3.31 cm2
AV Area VTI: 3.25 cm2
AV Area mean vel: 3.15 cm2
AV Mean grad: 4.5 mmHg
AV Peak grad: 8.5 mmHg
Ao pk vel: 1.46 m/s
Area-P 1/2: 3.21 cm2
Height: 66 in
MV M vel: 1.38 m/s
MV Peak grad: 7.6 mmHg
S' Lateral: 2.8 cm
Weight: 3680 [oz_av]

## 2024-01-22 LAB — CBC
HCT: 37.3 % (ref 36.0–46.0)
Hemoglobin: 12.1 g/dL (ref 12.0–15.0)
MCH: 26.1 pg (ref 26.0–34.0)
MCHC: 32.4 g/dL (ref 30.0–36.0)
MCV: 80.6 fL (ref 80.0–100.0)
Platelets: 384 K/uL (ref 150–400)
RBC: 4.63 MIL/uL (ref 3.87–5.11)
RDW: 14.7 % (ref 11.5–15.5)
WBC: 9.8 K/uL (ref 4.0–10.5)
nRBC: 0 % (ref 0.0–0.2)

## 2024-01-22 LAB — URINE DRUG SCREEN
Amphetamines: NEGATIVE
Barbiturates: NEGATIVE
Benzodiazepines: NEGATIVE
Cocaine: NEGATIVE
Fentanyl: NEGATIVE
Methadone Scn, Ur: NEGATIVE
Opiates: NEGATIVE
Tetrahydrocannabinol: NEGATIVE

## 2024-01-22 LAB — MAGNESIUM: Magnesium: 2.2 mg/dL (ref 1.7–2.4)

## 2024-01-22 LAB — COMPREHENSIVE METABOLIC PANEL WITH GFR
ALT: 10 U/L (ref 0–44)
AST: 15 U/L (ref 15–41)
Albumin: 4.1 g/dL (ref 3.5–5.0)
Alkaline Phosphatase: 60 U/L (ref 38–126)
Anion gap: 12 (ref 5–15)
BUN: 15 mg/dL (ref 6–20)
CO2: 24 mmol/L (ref 22–32)
Calcium: 9.3 mg/dL (ref 8.9–10.3)
Chloride: 101 mmol/L (ref 98–111)
Creatinine, Ser: 0.83 mg/dL (ref 0.44–1.00)
GFR, Estimated: >60 mL/min
Glucose, Bld: 173 mg/dL — ABNORMAL HIGH (ref 70–99)
Potassium: 3.8 mmol/L (ref 3.5–5.1)
Sodium: 136 mmol/L (ref 135–145)
Total Bilirubin: 0.2 mg/dL (ref 0.0–1.2)
Total Protein: 7.6 g/dL (ref 6.5–8.1)

## 2024-01-22 LAB — PHOSPHORUS: Phosphorus: 3.1 mg/dL (ref 2.5–4.6)

## 2024-01-22 LAB — LIPID PANEL
Cholesterol: 181 mg/dL (ref 0–200)
HDL: 72 mg/dL
LDL Cholesterol: 99 mg/dL (ref 0–99)
Total CHOL/HDL Ratio: 2.5 ratio
Triglycerides: 52 mg/dL
VLDL: 10 mg/dL (ref 0–40)

## 2024-01-22 LAB — HEMOGLOBIN A1C
Hgb A1c MFr Bld: 5.5 % (ref 4.8–5.6)
Mean Plasma Glucose: 111.15 mg/dL

## 2024-01-22 LAB — TSH: TSH: 0.427 u[IU]/mL (ref 0.350–4.500)

## 2024-01-22 LAB — GLUCOSE, CAPILLARY: Glucose-Capillary: 101 mg/dL — ABNORMAL HIGH (ref 70–99)

## 2024-01-22 LAB — HIV ANTIBODY (ROUTINE TESTING W REFLEX): HIV Screen 4th Generation wRfx: NONREACTIVE

## 2024-01-22 MED ORDER — ASPIRIN 325 MG PO TABS: 650.0000 mg | ORAL_TABLET | Freq: Once | ORAL | Status: DC

## 2024-01-22 MED ORDER — ASPIRIN 81 MG PO TBEC
81.0000 mg | DELAYED_RELEASE_TABLET | Freq: Every day | ORAL | Status: DC
  Administered 2024-01-22 – 2024-01-23 (×2): 81 mg via ORAL
  Filled 2024-01-22 (×2): qty 1

## 2024-01-22 MED ORDER — ATORVASTATIN CALCIUM 80 MG PO TABS
80.0000 mg | ORAL_TABLET | Freq: Every day | ORAL | Status: DC
  Administered 2024-01-22: 80 mg via ORAL
  Filled 2024-01-22: qty 1

## 2024-01-22 MED ORDER — HYDRALAZINE HCL 20 MG/ML IJ SOLN: 10.0000 mg | INTRAMUSCULAR | Status: DC | PRN

## 2024-01-22 MED ORDER — CLOPIDOGREL BISULFATE 75 MG PO TABS
300.0000 mg | ORAL_TABLET | Freq: Once | ORAL | Status: AC
  Administered 2024-01-22: 300 mg via ORAL
  Filled 2024-01-22: qty 4

## 2024-01-22 MED ORDER — ACETAMINOPHEN 325 MG PO TABS: 650.0000 mg | ORAL_TABLET | Freq: Four times a day (QID) | ORAL | Status: DC | PRN

## 2024-01-22 MED ORDER — HYDROMORPHONE HCL 1 MG/ML IJ SOLN
0.5000 mg | INTRAMUSCULAR | Status: DC | PRN
  Administered 2024-01-22: 1 mg via INTRAVENOUS
  Filled 2024-01-22: qty 1

## 2024-01-22 MED ORDER — SENNOSIDES-DOCUSATE SODIUM 8.6-50 MG PO TABS: 1.0000 | ORAL_TABLET | Freq: Every evening | ORAL | Status: DC | PRN

## 2024-01-22 MED ORDER — ACETAMINOPHEN 325 MG PO TABS
650.0000 mg | ORAL_TABLET | Freq: Four times a day (QID) | ORAL | Status: DC | PRN
  Administered 2024-01-23: 650 mg via ORAL
  Filled 2024-01-22: qty 2

## 2024-01-22 MED ORDER — SENNOSIDES-DOCUSATE SODIUM 8.6-50 MG PO TABS
1.0000 | ORAL_TABLET | Freq: Every day | ORAL | Status: DC
  Administered 2024-01-22: 1 via ORAL
  Filled 2024-01-22: qty 1

## 2024-01-22 MED ORDER — TRAZODONE HCL 50 MG PO TABS: 25.0000 mg | ORAL_TABLET | Freq: Every evening | ORAL | Status: DC | PRN

## 2024-01-22 MED ORDER — ACETAMINOPHEN 650 MG RE SUPP: 650.0000 mg | Freq: Four times a day (QID) | RECTAL | Status: DC | PRN

## 2024-01-22 MED ORDER — CLOPIDOGREL BISULFATE 75 MG PO TABS
75.0000 mg | ORAL_TABLET | Freq: Every day | ORAL | Status: DC
  Administered 2024-01-23: 75 mg via ORAL
  Filled 2024-01-22: qty 1

## 2024-01-22 MED ORDER — FAMOTIDINE 20 MG PO TABS
40.0000 mg | ORAL_TABLET | Freq: Every day | ORAL | Status: DC
  Administered 2024-01-22: 40 mg via ORAL
  Filled 2024-01-22: qty 2

## 2024-01-22 MED ORDER — HEPARIN SODIUM (PORCINE) 5000 UNIT/ML IJ SOLN
5000.0000 [IU] | Freq: Three times a day (TID) | INTRAMUSCULAR | Status: DC
  Administered 2024-01-22 (×3): 5000 [IU] via SUBCUTANEOUS
  Filled 2024-01-22 (×4): qty 1

## 2024-01-22 MED ORDER — KETOROLAC TROMETHAMINE 15 MG/ML IJ SOLN
7.5000 mg | Freq: Four times a day (QID) | INTRAMUSCULAR | Status: DC | PRN
  Administered 2024-01-22: 7.5 mg via INTRAVENOUS
  Filled 2024-01-22: qty 1

## 2024-01-22 MED ORDER — IOHEXOL 350 MG/ML SOLN
75.0000 mL | Freq: Once | INTRAVENOUS | Status: AC | PRN
  Administered 2024-01-22: 75 mL via INTRAVENOUS

## 2024-01-22 MED ORDER — HYDRALAZINE HCL 20 MG/ML IJ SOLN: 10.0000 mg | Freq: Four times a day (QID) | INTRAMUSCULAR | Status: DC | PRN

## 2024-01-22 MED ORDER — ASPIRIN 325 MG PO TABS
325.0000 mg | ORAL_TABLET | Freq: Once | ORAL | Status: AC
  Administered 2024-01-22: 325 mg via ORAL
  Filled 2024-01-22: qty 1

## 2024-01-22 MED ORDER — BISACODYL 5 MG PO TBEC: 5.0000 mg | DELAYED_RELEASE_TABLET | Freq: Every day | ORAL | Status: DC | PRN

## 2024-01-22 MED ORDER — FLEET ENEMA RE ENEM: 1.0000 | ENEMA | Freq: Once | RECTAL | Status: DC | PRN

## 2024-01-22 MED ORDER — ONDANSETRON HCL 4 MG/2ML IJ SOLN: 4.0000 mg | Freq: Four times a day (QID) | INTRAMUSCULAR | Status: DC | PRN

## 2024-01-22 MED ORDER — ONDANSETRON HCL 4 MG PO TABS: 4.0000 mg | ORAL_TABLET | Freq: Four times a day (QID) | ORAL | Status: DC | PRN

## 2024-01-22 MED ORDER — FLUTICASONE FUROATE-VILANTEROL 100-25 MCG/ACT IN AEPB
1.0000 | INHALATION_SPRAY | Freq: Every day | RESPIRATORY_TRACT | Status: DC
  Administered 2024-01-22 – 2024-01-23 (×2): 1 via RESPIRATORY_TRACT
  Filled 2024-01-22: qty 28

## 2024-01-22 MED ORDER — ALBUTEROL SULFATE (2.5 MG/3ML) 0.083% IN NEBU: 2.5000 mg | INHALATION_SOLUTION | Freq: Four times a day (QID) | RESPIRATORY_TRACT | Status: DC | PRN

## 2024-01-22 MED ORDER — STROKE: EARLY STAGES OF RECOVERY BOOK
Freq: Once | Status: AC
  Administered 2024-01-22: 1
  Filled 2024-01-22: qty 1

## 2024-01-22 MED ORDER — SODIUM CHLORIDE 0.9% FLUSH
3.0000 mL | Freq: Two times a day (BID) | INTRAVENOUS | Status: DC
  Administered 2024-01-23: 3 mL via INTRAVENOUS

## 2024-01-22 MED ORDER — MONTELUKAST SODIUM 10 MG PO TABS
10.0000 mg | ORAL_TABLET | Freq: Every day | ORAL | Status: DC
  Administered 2024-01-22: 10 mg via ORAL
  Filled 2024-01-22: qty 1

## 2024-01-22 MED ORDER — SODIUM CHLORIDE 0.9 % IV SOLN: INTRAVENOUS | Status: AC

## 2024-01-22 MED ORDER — OXYCODONE HCL 5 MG PO TABS
5.0000 mg | ORAL_TABLET | ORAL | Status: DC | PRN
  Administered 2024-01-23: 5 mg via ORAL
  Filled 2024-01-22: qty 1

## 2024-01-22 MED ORDER — SODIUM CHLORIDE 0.9% FLUSH
3.0000 mL | Freq: Two times a day (BID) | INTRAVENOUS | Status: DC
  Administered 2024-01-22 – 2024-01-23 (×3): 3 mL via INTRAVENOUS

## 2024-01-22 MED ORDER — ATORVASTATIN CALCIUM 40 MG PO TABS
40.0000 mg | ORAL_TABLET | Freq: Every day | ORAL | Status: DC
  Administered 2024-01-23: 40 mg via ORAL
  Filled 2024-01-22: qty 1

## 2024-01-22 MED ORDER — FAMOTIDINE 20 MG PO TABS
40.0000 mg | ORAL_TABLET | Freq: Two times a day (BID) | ORAL | Status: DC
  Administered 2024-01-22 – 2024-01-23 (×2): 40 mg via ORAL
  Filled 2024-01-22 (×2): qty 2

## 2024-01-22 NOTE — ED Notes (Signed)
 Carehand off was attempted via phone, contact was made with 3w secretary was transferred with no answer, carelink has departed at this time.

## 2024-01-22 NOTE — ED Provider Notes (Signed)
 CT angiogram shows occluded left V3 vertebral artery which may be secondary to dissection.  I have discussed this with Dr. Voncile of neurology service who has reviewed the images and recommends giving initial dose of aspirin  and clopidogrel , which have been ordered.   Raford Lenis, MD 01/22/24 [phone]

## 2024-01-22 NOTE — Assessment & Plan Note (Signed)
 Advised and recommended close follow-up with PCP, with change in lifestyle, increase activity, exercise, weight loss program with PCP

## 2024-01-22 NOTE — Plan of Care (Signed)
  Problem: Education: Goal: Knowledge of General Education information will improve Description: Including pain rating scale, medication(s)/side effects and non-pharmacologic comfort measures Outcome: Progressing   Problem: Health Behavior/Discharge Planning: Goal: Ability to manage health-related needs will improve Outcome: Progressing   Problem: Clinical Measurements: Goal: Ability to maintain clinical measurements within normal limits will improve Outcome: Progressing Goal: Diagnostic test results will improve Outcome: Progressing Goal: Cardiovascular complication will be avoided Outcome: Progressing   Problem: Activity: Goal: Risk for activity intolerance will decrease Outcome: Progressing   

## 2024-01-22 NOTE — Assessment & Plan Note (Signed)
 As needed IV analgesics, Monitor closely for any neurological changes

## 2024-01-22 NOTE — Progress Notes (Signed)
 STROKE TEAM PROGRESS NOTE   SUBJECTIVE (INTERVAL HISTORY) No family is at the bedside.  Overall her condition is completely resolved. She walks independent in her room. She denies any head or neck trauma or manipulation or aggressive neck movement.    OBJECTIVE Temp:  [97.9 F (36.6 C)-98.7 F (37.1 C)] 98.6 F (37 C) (01/07 1145) Pulse Rate:  [54-89] 79 (01/07 1145) Cardiac Rhythm: Normal sinus rhythm (01/07 0710) Resp:  [12-20] 16 (01/07 1145) BP: (152-184)/(78-106) 153/96 (01/07 1145) SpO2:  [94 %-100 %] 100 % (01/07 1145) Weight:  [104.3 kg] 104.3 kg (01/06 1750)  Recent Labs  Lab 01/21/24 1756 01/22/24 0851  GLUCAP 126* 101*   Recent Labs  Lab 01/21/24 1805 01/22/24 0606 01/22/24 0913  NA 139 136  --   K 3.9 3.8  --   CL 103 101  --   CO2 26 24  --   GLUCOSE 111* 173*  --   BUN 15 15  --   CREATININE 0.85 0.83  --   CALCIUM  9.5 9.3  --   MG  --   --  2.2  PHOS  --   --  3.1   Recent Labs  Lab 01/21/24 1805 01/22/24 0606  AST 16 15  ALT 9 10  ALKPHOS 66 60  BILITOT <0.2 0.2  PROT 7.6 7.6  ALBUMIN 4.3 4.1   Recent Labs  Lab 01/21/24 1805 01/22/24 0606  WBC 6.7 9.8  NEUTROABS 4.5  --   HGB 11.4* 12.1  HCT 35.6* 37.3  MCV 80.7 80.6  PLT 368 384   No results for input(s): CKTOTAL, CKMB, CKMBINDEX, TROPONINI in the last 168 hours. Recent Labs    01/21/24 1805  LABPROT 13.2  INR 1.0   No results for input(s): COLORURINE, LABSPEC, PHURINE, GLUCOSEU, HGBUR, BILIRUBINUR, KETONESUR, PROTEINUR, UROBILINOGEN, NITRITE, LEUKOCYTESUR in the last 72 hours.  Invalid input(s): APPERANCEUR     Component Value Date/Time   CHOL 181 01/22/2024 0913   TRIG 52 01/22/2024 0913   HDL 72 01/22/2024 0913   CHOLHDL 2.5 01/22/2024 0913   VLDL 10 01/22/2024 0913   LDLCALC 99 01/22/2024 0913   Lab Results  Component Value Date   HGBA1C 5.5 01/22/2024      Component Value Date/Time   LABOPIA NEGATIVE 01/21/2024 1831    COCAINSCRNUR NEGATIVE 01/21/2024 1831   LABBENZ NEGATIVE 01/21/2024 1831   AMPHETMU NEGATIVE 01/21/2024 1831   THCU NEGATIVE 01/21/2024 1831   LABBARB NEGATIVE 01/21/2024 1831    Recent Labs  Lab 01/21/24 1805  ETH <15    I have personally reviewed the radiological images below and agree with the radiology interpretations.  MR BRAIN WO CONTRAST Result Date: 01/22/2024 EXAM: MRI BRAIN WITHOUT CONTRAST 01/22/2024 04:18:08 AM TECHNIQUE: Multiplanar multisequence MRI of the head/brain was performed without the administration of intravenous contrast. COMPARISON: CT head yesterday, CTA and CT venogram earlier today. CLINICAL HISTORY: 44 year old female. Possible distal left vertebral artery dissection/occlusion, severe headache, off balance. FINDINGS: BRAIN AND VENTRICLES: The flow void of the distal left vertebral artery is abnormal near the craniocervical junction, concordant with CTA appearance (series 10 image 2). Other major vascular flow voids appear preserved. Difficult to exclude punctate diffusion restriction in the left cerebellum on series 5 image 62 (and series 6 image 12), but no associated T2 or FLAIR changes there. No other abnormal diffusion, no other acute infarction identified. Background gray and white matter signal is normal for age. The deep gray nuclei, brainstem, and cerebellum appear negative  except for minimal nonspecific T2 hyperintensity in the left deep cerebellar nuclei on series 10 image 7. No other encephalomalacia, and no chronic intra axial blood products identified. But there is abnormal susceptibility associated with the distal left vertebral artery in the posterior fossa (series 12 images 9 through 13) compatible with thrombosis. No intracranial hemorrhage. No mass. No midline shift. No hydrocephalus. The sella is unremarkable. ORBITS: No abnormality. SINUSES AND MASTOIDS: Paranasal sinuses and mastoids are well aerated. BONES AND SOFT TISSUES: Normal marrow signal. No  acute soft tissue abnormality. Negative visible cervical spine. Grossly normal visible internal auditory structures. IMPRESSION: 1. Abnormal MRI appearance of the Distal left vertebral artery compatible with thrombosis, such as due to dissection. 2. Cannot exclude a punctate acute infarct in the left cerebellum, but No other acute intracranial abnormality is identified. Electronically signed by: Helayne Hurst MD 01/22/2024 04:32 AM EST RP Workstation: [id number]   CT VENOGRAM HEAD Result Date: 01/22/2024 EXAM: CT VENOGRAM WITH CONTRAST 01/22/2024 01:40:52 AM TECHNIQUE: CT venogram of the head/brain was performed with the administration of intravenous contrast. Multiplanar reformatted images are provided for review. MIP images are provided for review. Automated exposure control, iterative reconstruction, and/or weight based adjustment of the mA/kV was utilized to reduce the radiation dose to as low as reasonably achievable. COMPARISON: None available. CLINICAL HISTORY: Dural venous sinus thrombosis suspected FINDINGS: No dural venous sinus thrombosis. No significant stenosis. IMPRESSION: 1. No dural venous sinus thrombosis. Electronically signed by: Gilmore Molt 01/22/2024 02:11 AM EST RP Workstation: [id number]   CT ANGIO HEAD NECK W WO CM Result Date: 01/22/2024 EXAM: CTA HEAD AND NECK WITH AND WITHOUT 01/22/2024 01:40:52 AM TECHNIQUE: CTA of the head and neck was performed with and without the administration of intravenous contrast. Multiplanar 2D and/or 3D reformatted images are provided for review. Automated exposure control, iterative reconstruction, and/or weight based adjustment of the mA/kV was utilized to reduce the radiation dose to as low as reasonably achievable. Stenosis of the internal carotid arteries measured using NASCET criteria. COMPARISON: None available CLINICAL HISTORY: Neuro deficit, acute, stroke suspected Neuro deficit, acute, stroke suspected FINDINGS: AORTIC ARCH AND ARCH VESSELS: No  dissection or arterial injury. No significant stenosis of the brachiocephalic or subclavian arteries. CERVICAL CAROTID ARTERIES: No dissection, arterial injury, or hemodynamically significant stenosis by NASCET criteria. CERVICAL VERTEBRAL ARTERIES: Occluded left v3 vertebral artery. Right vertebral artery is patent without significant stenosis. LUNGS AND MEDIASTINUM: Unremarkable. SOFT TISSUES: No acute abnormality. BONES: No acute abnormality. ANTERIOR CIRCULATION: No significant stenosis of the internal carotid arteries. No significant stenosis of the anterior cerebral arteries. No significant stenosis of the middle cerebral arteries. No aneurysm. POSTERIOR CIRCULATION: No significant stenosis of the posterior cerebral arteries. No significant stenosis of the basilar artery. Left intradural vertebral artery is not opacified. Right vertebral artery is patent without stenosis. No aneurysm. OTHER: No dural venous sinus thrombosis on this non-dedicated study. Findings discussed with Dr. Raford via telephone at 2:04 AM. IMPRESSION: 1. Occluded left v3 vertebral artery which may be due to dissection given paucity of atherosclerosis elsewhere. 2. Otherwise, no significant stenosis. Electronically signed by: Gilmore Molt 01/22/2024 02:07 AM EST RP Workstation: [id number]   CT HEAD WO CONTRAST Result Date: 01/21/2024 EXAM: CT HEAD WITHOUT CONTRAST 01/21/2024 06:20:46 PM TECHNIQUE: CT of the head was performed without the administration of intravenous contrast. Automated exposure control, iterative reconstruction, and/or weight based adjustment of the mA/kV was utilized to reduce the radiation dose to as low as reasonably achievable. COMPARISON: None available. CLINICAL HISTORY:  Neurological deficit, acute, suspected stroke. Headache, imbalance, and decreased sensation to the left side of the body. FINDINGS: BRAIN AND VENTRICLES: There is no evidence of an acute infarct, intracranial hemorrhage, mass, midline shift,  hydrocephalus, or extra-axial fluid collection. Cerebral volume is normal. ORBITS: No acute abnormality. SINUSES: No acute abnormality. SOFT TISSUES AND SKULL: No acute soft tissue abnormality. No skull fracture. IMPRESSION: 1. Negative head CT. Electronically signed by: Dasie Hamburg MD 01/21/2024 06:47 PM EST RP Workstation: [id number]     PHYSICAL EXAM  Temp:  [97.9 F (36.6 C)-98.7 F (37.1 C)] 98.6 F (37 C) (01/07 1145) Pulse Rate:  [54-89] 79 (01/07 1145) Resp:  [12-20] 16 (01/07 1145) BP: (152-184)/(78-106) 153/96 (01/07 1145) SpO2:  [94 %-100 %] 100 % (01/07 1145) Weight:  [104.3 kg] 104.3 kg (01/06 1750)  General - obses, well developed, in no apparent distress.  Ophthalmologic - fundi not visualized due to noncooperation.  Cardiovascular - Regular rhythm and rate.  Mental Status -  Level of arousal and orientation to time, place, and person were intact Language including expression, naming, repetition, comprehension was assessed and found intact. Fund of Knowledge was assessed and was intact.  Cranial Nerves II - XII - II - Visual field intact OU. III, IV, VI - Extraocular movements intact. V - Facial sensation intact bilaterally. VII - Facial movement intact bilaterally. VIII - Hearing & vestibular intact bilaterally. X - Palate elevates symmetrically. XI - Chin turning & shoulder shrug intact bilaterally. XII - Tongue protrusion intact.  Motor Strength - The patients strength was normal in all extremities and pronator drift was absent.  Bulk was normal and fasciculations were absent.   Motor Tone - Muscle tone was assessed at the neck and appendages and was normal.  Reflexes - The patients reflexes were symmetrical in all extremities and she had no pathological reflexes.  Sensory - Light touch, temperature/pinprick were assessed and were symmetrical.    Coordination - The patient had normal movements in the hands and feet with no ataxia or dysmetria.  Tremor  was absent.  Gait and Station - deferred.   ASSESSMENT/PLAN [name] is a 44 y.o. female with history of asthma admitted for headache for 2 days, left-sided numbness weakness and left leaning on walking. No TNK given due to altered window.    Stroke: punctate left cerebellar infarct, etiology likely related to left V3 occlusion, concerning for dissection CT head no acute abnormality. CTA head and neck left V3 occlusion, concerning for dissection CTV negative MRI x 2 showed punctate left cerebellar infarct 2D Echo EF 70 to 75% LDL 99 HgbA1c 5.5 UDS negative Heparin  subcu for VTE prophylaxis No antithrombotic prior to admission, now on aspirin  81 mg daily and clopidogrel  75 mg daily DAPT for 3 months and then aspirin  alone given history of occlusion. Patient counseled to be compliant with her antithrombotic medications Ongoing aggressive stroke risk factor management Repeat CTA in 1 to 2 months to monitor left VA  Hypertension Home meds losartan Stable on high end Okay to resume home meds on discharge Long term BP goal normotensive  Hyperlipidemia Home meds: None LDL 99, goal < 70 Now on Lipitor 40 Continue statin at discharge  Other Stroke Risk Factors Obesity, Body mass index is 37.12 kg/m.   Other Active Problems HA overnight treated with HA cocktail, MRI repeat unchanged  Hospital day # 0  Neurology will sign off. Please call with questions. Pt will follow up with stroke clinic NP at [facility]  in about 4 weeks. Thanks for the consult.   Ary Cummins, MD PhD Stroke Neurology 01/22/2024 12:25 PM    To contact Stroke Continuity provider, please refer to [email or web address]. After hours, contact General Neurology

## 2024-01-22 NOTE — Progress Notes (Signed)
 SLP Cancellation Note  Patient Details Name: LAKECHIA NAY MRN: [id number] DOB: November 27, 1980   Cancelled treatment:        Swallowing evaluation orders received and appreciated. Pt has passed [facility] swallow screen x2. Spoke with nursing and now formal assessment needed.  SLP will defer evaluation and sign off.    Anette FORBES Grippe, MA, CCC-SLP Acute Rehabilitation Services Office: [phone] 01/22/2024, 8:58 AM

## 2024-01-22 NOTE — Progress Notes (Signed)
 Pt states at approx 1445 she started to feel numbness at the midline lips to the left cheek (inside and outer cheeks).  She describes it as feeling like she received a dose of novocaine.  Full neuro assessment done; no other neuro deficits noted at this time

## 2024-01-22 NOTE — H&P (Signed)
 " History and Physical   Patient: [name]                            PCP: Rexanne Ingle, MD                    DOB: [birth year]            DOA: 01/21/2024 FMW:[id number]             DOS: 01/22/2024, 10:17 AM  Rexanne Ingle, MD  Patient coming from:   HOME  I have personally reviewed patient's medical records, in electronic medical records, including:  [place] [facility], and care everywhere.    Chief Complaint:   Chief Complaint  Patient presents with   Headache   Weakness    History of present illness:    [name] is a  44 yr old female with HTN, asthma, seasonal allergies, GERD, PUD,  presents to [facility] with sever headache and left-sided.  Headache started about 2 days ago progressed to transient left-sided weakness and numbness which prompted her to come to the ED.   She reports of no associated double vision, denies any recent illnesses fever chills or congestion.  Denies any upper or lower extremity pain.  Denies any neck pain   CT head unremarkable.  CT angio head and neck concerning for left vertebral artery dissection intradurally.   Transferred for neurological evaluation and further workup.   Upon arrival: Blood pressure (!) 178/106, pulse 63, temperature 98.3 F (36.8 C), RR 16, height  SpO2 100%,   Symptoms almost resolved !  Labs, CBC CMP within normal limits with a glucose of 126, urine drug screen negative, pregnancy test negative alcohol less than 15   MRI appearance of the Distal left vertebral artery compatible with thrombosis, such as due to dissection. Cannot exclude a punctate acute infarct in the left cerebellum, but No other acute intracranial abnormality is identified.   Status post evaluation by neurology Dr. Voncile Gab aspirin  and Plavix  recommended admission for further evaluation of stroke      Patient Denies having: Fever, Chills, Cough, SOB, Chest Pain, Abd pain, N/V/D, dizziness, lightheadedness,  Dysuria, Joint pain, rash,  open wounds    Review of Systems: As per HPI, otherwise 10 point review of systems were negative.   ----------------------------------------------------------------------------------------------------------------------  Allergies[1]  Home MEDs:  Prior to Admission medications  Medication Sig Start Date End Date Taking? Authorizing Provider  albuterol  (PROVENTIL ) (2.5 MG/3ML) 0.083% nebulizer solution Take 3 mLs (2.5 mg total) by nebulization every 6 (six) hours as needed for wheezing or shortness of breath. 05/10/21   Darlean Ozell NOVAK, MD  budesonide -formoterol  (SYMBICORT ) 80-4.5 MCG/ACT inhaler Take 2 puffs first thing in am and then another 2 puffs about 12 hours later. 10/16/23   Darlean Ozell NOVAK, MD  cetirizine  (ZYRTEC ) 10 MG tablet Take 1 tablet (10 mg total) by mouth daily. 07/14/20   Donah Laymon PARAS, MD  diclofenac  Sodium (VOLTAREN ) 1 % GEL Apply 2 g topically 4 (four) times daily. 05/24/20   Donah Laymon PARAS, MD  famotidine  (PEPCID ) 20 MG tablet TAKE 1 TABLET BY MOUTH AFTER SUPPER 08/29/23   Wert, Michael B, MD  montelukast  (SINGULAIR ) 10 MG tablet Take 1 tablet (10 mg total) by mouth at bedtime. 08/29/23   Darlean Ozell NOVAK, MD  PROAIR  HFA 108 (90 Base) MCG/ACT inhaler INHALE 2 PUFFS BY MOUTH EVERY 4 HOURS AS NEEDED  FOR WHEEZING AND FOR SHORTNESS OF BREATH 08/08/20   Donah Laymon PARAS, MD  valsartan (DIOVAN) 40 MG tablet Take 40 mg by mouth daily.    [provider]    PRN MEDs: acetaminophen  **OR** acetaminophen , albuterol , hydrALAZINE , HYDROmorphone  (DILAUDID ) injection, ketorolac , ondansetron  **OR** ondansetron  (ZOFRAN ) IV, oxyCODONE , traZODone   Past Medical History:  Diagnosis Date   Asthma     reservations to 2-3 timmes  a year that required ED visits/hospitalizations. Never intubated   GERD (gastroesophageal reflux disease)    Seasonal allergies     Past Surgical History:  Procedure Laterality Date   CESAREAN SECTION     CESAREAN SECTION N/A 10/25/2012    Procedure: CESAREAN SECTION;  Surgeon: Rexene PARAS. Rosalva, MD;  Location: [facility];  Service: Obstetrics;  Laterality: N/A;   CESAREAN SECTION N/A 03/05/2014   Procedure: CESAREAN SECTION;  Surgeon: Jon CINDERELLA Rummer, MD;  Location: [facility];  Service: Obstetrics;  Laterality: N/A;   CESAREAN SECTION WITH BILATERAL TUBAL LIGATION N/A 11/06/2019   Procedure: CESAREAN SECTION WITH BILATERAL TUBAL LIGATION;  Surgeon: Armond Cape, MD;  Location: [facility];  Service: Obstetrics;  Laterality: N/A;   CHOLECYSTECTOMY N/A 04/09/2017   Procedure: LAPAROSCOPIC CHOLECYSTECTOMY;  Surgeon: Rubin Calamity, MD;  Location: [facility] OR;  Service: General;  Laterality: N/A;   FRACTURE SURGERY     left middle finger     reports that she quit smoking about 23 years ago. Her smoking use included cigarettes. She started smoking about 23 years ago. She has a 0.2 pack-year smoking history. She has never used smokeless tobacco. She reports that she does not drink alcohol and does not use drugs.   Family History  Problem Relation Age of Onset   Hypertension Mother    Allergies Mother    Sleep apnea Father    Hyperlipidemia Father    Allergies Maternal Grandmother    Asthma Maternal Grandmother    Diabetes Maternal Grandmother     Physical Exam:   Vitals:   01/22/24 0052 01/22/24 0200 01/22/24 0258 01/22/24 0752  BP: (!) 152/93 (!) 171/94 (!) 178/106   Pulse: (!) 54 89 63 78  Resp: 20 16 16 16   Temp: 97.9 F (36.6 C)  98.3 F (36.8 C)   TempSrc: Oral  Oral   SpO2: 98% 94% 100% 100%  Weight:      Height:       Constitutional: NAD, calm, comfortable Eyes: PERRL, lids and conjunctivae normal ENMT: Mucous membranes are moist. Posterior pharynx clear of any exudate or lesions.Normal dentition.  Neck: normal, supple, no masses, no thyromegaly Respiratory: clear to auscultation bilaterally, no wheezing, no crackles. Normal respiratory effort. No accessory muscle use.  Cardiovascular: Regular rate and rhythm, no murmurs  / rubs / gallops. No extremity edema. 2+ pedal pulses. No carotid bruits.  Abdomen: no tenderness, no masses palpated. No hepatosplenomegaly. Bowel sounds positive.  Musculoskeletal: no clubbing / cyanosis. No joint deformity upper and lower extremities. Good ROM, no contractures. Normal muscle tone.  Neurologic: CN II-XII grossly intact. Sensation intact, DTR normal. Strength 5/5 in all 4.  Psychiatric: Normal judgment and insight. Alert and oriented x 3. Normal mood.  Skin: no rashes, lesions, ulcers. No induration     Labs on admission:    I have personally reviewed following labs and imaging studies  CBC: Recent Labs  Lab 01/21/24 1805 01/22/24 0606  WBC 6.7 9.8  NEUTROABS 4.5  --   HGB 11.4* 12.1  HCT 35.6* 37.3  MCV 80.7  80.6  PLT 368 384   Basic Metabolic Panel: Recent Labs  Lab 01/21/24 1805 01/22/24 0606  NA 139 136  K 3.9 3.8  CL 103 101  CO2 26 24  GLUCOSE 111* 173*  BUN 15 15  CREATININE 0.85 0.83  CALCIUM  9.5 9.3   GFR: Estimated Creatinine Clearance: 107.7 mL/min (by C-G formula based on SCr of 0.83 mg/dL). Liver Function Tests: Recent Labs  Lab 01/21/24 1805 01/22/24 0606  AST 16 15  ALT 9 10  ALKPHOS 66 60  BILITOT <0.2 0.2  PROT 7.6 7.6  ALBUMIN 4.3 4.1   No results for input(s): LIPASE, AMYLASE in the last 168 hours. No results for input(s): AMMONIA in the last 168 hours. Coagulation Profile: Recent Labs  Lab 01/21/24 1805  INR 1.0    Recent Labs  Lab 01/21/24 1756 01/22/24 0851  GLUCAP 126* 101*    Urine analysis:    Component Value Date/Time   COLORURINE YELLOW 09/08/2019 1148   APPEARANCEUR HAZY (A) 09/08/2019 1148   LABSPEC 1.016 09/08/2019 1148   PHURINE 7.0 09/08/2019 1148   GLUCOSEU NEGATIVE 09/08/2019 1148   HGBUR NEGATIVE 09/08/2019 1148   BILIRUBINUR NEGATIVE 09/08/2019 1148   BILIRUBINUR NEG 04/18/2012 1511   KETONESUR NEGATIVE 09/08/2019 1148   PROTEINUR NEGATIVE 09/08/2019 1148   UROBILINOGEN 0.2  01/03/2014 1748   NITRITE NEGATIVE 09/08/2019 1148   LEUKOCYTESUR TRACE (A) 09/08/2019 1148    Last A1C:  Lab Results  Component Value Date   HGBA1C 5.5 01/22/2024     Radiologic Exams on Admission:   MR BRAIN WO CONTRAST Result Date: 01/22/2024 EXAM: MRI BRAIN WITHOUT CONTRAST 01/22/2024 04:18:08 AM TECHNIQUE: Multiplanar multisequence MRI of the head/brain was performed without the administration of intravenous contrast. COMPARISON: CT head yesterday, CTA and CT venogram earlier today. CLINICAL HISTORY: 44 year old female. Possible distal left vertebral artery dissection/occlusion, severe headache, off balance. FINDINGS: BRAIN AND VENTRICLES: The flow void of the distal left vertebral artery is abnormal near the craniocervical junction, concordant with CTA appearance (series 10 image 2). Other major vascular flow voids appear preserved. Difficult to exclude punctate diffusion restriction in the left cerebellum on series 5 image 62 (and series 6 image 12), but no associated T2 or FLAIR changes there. No other abnormal diffusion, no other acute infarction identified. Background gray and white matter signal is normal for age. The deep gray nuclei, brainstem, and cerebellum appear negative except for minimal nonspecific T2 hyperintensity in the left deep cerebellar nuclei on series 10 image 7. No other encephalomalacia, and no chronic intra axial blood products identified. But there is abnormal susceptibility associated with the distal left vertebral artery in the posterior fossa (series 12 images 9 through 13) compatible with thrombosis. No intracranial hemorrhage. No mass. No midline shift. No hydrocephalus. The sella is unremarkable. ORBITS: No abnormality. SINUSES AND MASTOIDS: Paranasal sinuses and mastoids are well aerated. BONES AND SOFT TISSUES: Normal marrow signal. No acute soft tissue abnormality. Negative visible cervical spine. Grossly normal visible internal auditory structures. IMPRESSION:  1. Abnormal MRI appearance of the Distal left vertebral artery compatible with thrombosis, such as due to dissection. 2. Cannot exclude a punctate acute infarct in the left cerebellum, but No other acute intracranial abnormality is identified. Electronically signed by: Helayne Hurst MD 01/22/2024 04:32 AM EST RP Workstation: [id number]   CT VENOGRAM HEAD Result Date: 01/22/2024 EXAM: CT VENOGRAM WITH CONTRAST 01/22/2024 01:40:52 AM TECHNIQUE: CT venogram of the head/brain was performed with the administration of intravenous contrast. Multiplanar reformatted  images are provided for review. MIP images are provided for review. Automated exposure control, iterative reconstruction, and/or weight based adjustment of the mA/kV was utilized to reduce the radiation dose to as low as reasonably achievable. COMPARISON: None available. CLINICAL HISTORY: Dural venous sinus thrombosis suspected FINDINGS: No dural venous sinus thrombosis. No significant stenosis. IMPRESSION: 1. No dural venous sinus thrombosis. Electronically signed by: Gilmore Molt 01/22/2024 02:11 AM EST RP Workstation: [id number]   CT ANGIO HEAD NECK W WO CM Result Date: 01/22/2024 EXAM: CTA HEAD AND NECK WITH AND WITHOUT 01/22/2024 01:40:52 AM TECHNIQUE: CTA of the head and neck was performed with and without the administration of intravenous contrast. Multiplanar 2D and/or 3D reformatted images are provided for review. Automated exposure control, iterative reconstruction, and/or weight based adjustment of the mA/kV was utilized to reduce the radiation dose to as low as reasonably achievable. Stenosis of the internal carotid arteries measured using NASCET criteria. COMPARISON: None available CLINICAL HISTORY: Neuro deficit, acute, stroke suspected Neuro deficit, acute, stroke suspected FINDINGS: AORTIC ARCH AND ARCH VESSELS: No dissection or arterial injury. No significant stenosis of the brachiocephalic or subclavian arteries. CERVICAL CAROTID ARTERIES:  No dissection, arterial injury, or hemodynamically significant stenosis by NASCET criteria. CERVICAL VERTEBRAL ARTERIES: Occluded left v3 vertebral artery. Right vertebral artery is patent without significant stenosis. LUNGS AND MEDIASTINUM: Unremarkable. SOFT TISSUES: No acute abnormality. BONES: No acute abnormality. ANTERIOR CIRCULATION: No significant stenosis of the internal carotid arteries. No significant stenosis of the anterior cerebral arteries. No significant stenosis of the middle cerebral arteries. No aneurysm. POSTERIOR CIRCULATION: No significant stenosis of the posterior cerebral arteries. No significant stenosis of the basilar artery. Left intradural vertebral artery is not opacified. Right vertebral artery is patent without stenosis. No aneurysm. OTHER: No dural venous sinus thrombosis on this non-dedicated study. Findings discussed with Dr. Raford via telephone at 2:04 AM. IMPRESSION: 1. Occluded left v3 vertebral artery which may be due to dissection given paucity of atherosclerosis elsewhere. 2. Otherwise, no significant stenosis. Electronically signed by: Gilmore Molt 01/22/2024 02:07 AM EST RP Workstation: [id number]   CT HEAD WO CONTRAST Result Date: 01/21/2024 EXAM: CT HEAD WITHOUT CONTRAST 01/21/2024 06:20:46 PM TECHNIQUE: CT of the head was performed without the administration of intravenous contrast. Automated exposure control, iterative reconstruction, and/or weight based adjustment of the mA/kV was utilized to reduce the radiation dose to as low as reasonably achievable. COMPARISON: None available. CLINICAL HISTORY: Neurological deficit, acute, suspected stroke. Headache, imbalance, and decreased sensation to the left side of the body. FINDINGS: BRAIN AND VENTRICLES: There is no evidence of an acute infarct, intracranial hemorrhage, mass, midline shift, hydrocephalus, or extra-axial fluid collection. Cerebral volume is normal. ORBITS: No acute abnormality. SINUSES: No acute  abnormality. SOFT TISSUES AND SKULL: No acute soft tissue abnormality. No skull fracture. IMPRESSION: 1. Negative head CT. Electronically signed by: Dasie Hamburg MD 01/21/2024 06:47 PM EST RP Workstation: [id number]    EKG:   Independently reviewed.  Orders placed or performed during the hospital encounter of 01/21/24   ED EKG   ED EKG   EKG 12-Lead   ---------------------------------------------------------------------------------------------------------------------------------------    Assessment / Plan:   Principal Problem:   Stroke (cerebrum) (HCC) Active Problems:   Artery dissection   Morbid obesity with BMI of 40.0-44.9, adult (HCC)   Peptic ulcer disease   Hypertension   Headache   Assessment and Plan: * Stroke (cerebrum) (HCC) Symptoms resolved  CT/MRI reviewed-appearance of the Distal left vertebral artery compatible with thrombosis, such as due to dissection.-  Cannot exclude a punctate acute infarct in the left cerebellum, but No other acute intracranial abnormality is identified.  - NPO  - PT/OT/speech consulted for evaluate - Neurology consulted, following closely-current recommendation    Aspirin  81 mg plus Plavix  75 mg daily -Will add high-dose statins, - Repeat CTA head and neck 3-4 months - Obtaining 2D echocardiogram-labs A1c, lipid panel, TSH, - Plan for permissive hypertension - Appreciate stroke team follow-up - Continue with neurochecks      Artery dissection CT/MRI head and neck reviewed:  Abnormal MRI appearance of the Distal left vertebral artery compatible with thrombosis, such as due to dissection.  -Continue neurochecks -Appreciate neurology review and recommendations   Headache As needed IV analgesics, Monitor closely for any neurological changes  Hypertension But holding home medication of Diovan for now -Allow for permissive hypertension  Peptic ulcer disease Continue PPI  Morbid obesity with BMI of 40.0-44.9, adult  (HCC) Advised and recommended close follow-up with PCP, with change in lifestyle, increase activity, exercise, weight loss program with PCP            Consults called: Neurology PT/OT/speech/social work -------------------------------------------------------------------------------------------------------------------------------------------- DVT prophylaxis:  heparin  injection 5,000 Units Start: 01/22/24 0815 SCDs Start: 01/22/24 0726 Place and maintain sequential compression device Start: 01/22/24 0329 Place TED hose Start: 01/22/24 0329   Code Status:   Code Status: Full Code   Admission status: Patient will be admitted as Inpatient, with a greater than 2 midnight length of stay. Level of care: Telemetry   Family Communication:  none at bedside  (The above findings and plan of care has been discussed with patient in detail, the patient expressed understanding and agreement of above plan)  --------------------------------------------------------------------------------------------------------------------------------------------------  Disposition Plan:  Anticipated 1-2 days Status is: Inpatient Remains inpatient appropriate because: Needing stroke workup, left vertebral artery dissection evaluation     ----------------------------------------------------------------------------------------------------------------------------------------------------  Time spent:  47  Min.  Was spent seeing and evaluating the patient, reviewing all medical records, drawn plan of care.  SIGNED: Adriana DELENA Grams, MD, FHM. FAAFP. [place] [facility], Pager  (Please use [email or web address] to page/ or secure chat through epic) If 7PM-7AM, please contact night-coverage [email or web address],  01/22/2024, 10:17 AM     [1]  Allergies Allergen Reactions   Iohexol  Hives and Itching        Moxifloxacin Hives and Itching   Other Itching    Pet Dander (dogs, cats)  - causes her asthma to  flare up   "

## 2024-01-22 NOTE — TOC Initial Note (Signed)
 Transition of Care [facility]) - Initial/Assessment Note    Patient Details  Name: [name] MRN: [id number] Date of Birth: 1980/11/22  [facility]) CM/SW Contact:    Andrez JULIANNA George, RN Phone Number: 01/22/2024, 12:02 PM  Clinical Narrative:                 [name] is a  44 yr old female with HTN, asthma, seasonal allergies, GERD, PUD,  presents to [facility] with sever headache and left-sided.  Headache started about 2 days ago progressed to transient left-sided weakness and numbness which prompted her to come to the ED.    Pt is from home with her spouse and children. Someone is with her most of the time.  No DME.  Pt manages her medications and denies any issues. Pt unable to see her PCP due to cost. CM will place [facility] facility appt on her AVS for PCP.  Pt was on medicaid but was cancelled. CM has sent referral to Financial counseling to see if they can re-qualify her.   Awaiting therapy recommendations.  IP Care management following.  Expected Discharge Plan:  (TBD) Barriers to Discharge: Continued Medical Work up   Patient Goals and CMS Choice            Expected Discharge Plan and Services   Discharge Planning Services: CM Consult   Living arrangements for the past 2 months: Apartment                                      Prior Living Arrangements/Services Living arrangements for the past 2 months: Apartment Lives with:: Spouse Patient language and need for interpreter reviewed:: Yes Do you feel safe going back to the place where you live?: Yes        Care giver support system in place?: Yes (comment)   Criminal Activity/Legal Involvement Pertinent to Current Situation/Hospitalization: No - Comment as needed  Activities of Daily Living   ADL Screening (condition at time of admission) Independently performs ADLs?: Yes (appropriate for developmental age) Is the patient deaf or have difficulty hearing?: Yes Does the patient have  difficulty seeing, even when wearing glasses/contacts?: Yes Does the patient have difficulty concentrating, remembering, or making decisions?: Yes  Permission Sought/Granted                  Emotional Assessment Appearance:: Appears stated age Attitude/Demeanor/Rapport: Engaged Affect (typically observed): Accepting Orientation: : Oriented to Self, Oriented to Place, Oriented to  Time, Oriented to Situation   Psych Involvement: No (comment)  Admission diagnosis:  Weakness [R53.1] Headache [R51.9] Acute nonintractable headache, unspecified headache type [R51.9] Patient Active Problem List   Diagnosis Date Noted   Stroke (cerebrum) (HCC) 01/22/2024   Artery dissection 01/22/2024   Headache 01/21/2024   DOE (dyspnea on exertion) 05/10/2021   Hypertension 05/24/2020   Peptic ulcer disease 02/01/2019   Chronic low back pain 09/10/2016   Asthma 06/22/2010   Morbid obesity with BMI of 40.0-44.9, adult (HCC) 05/24/2006   PCP:  Rexanne Ingle, MD Pharmacy:   [facility] [id number] - [address] [place] - [address] [facility] [address] [place] [address] Phone: [phone] Fax: [phone]  [id number] - [place], [place] - [address] AT [facility] [address] [place] [address] Phone: [phone] Fax: [phone]  [id number] - [place], [place] -  [address] [place] [address] Phone: [phone] Fax: [phone]  [id number] [place], [place] - [address] AT [facility] [address] [place] [address] Phone: [phone] Fax: [phone]     Social Drivers of Health (SDOH) Social History: SDOH Screenings   Food Insecurity: No Food Insecurity (01/22/2024)  Housing: Low Risk (01/22/2024)  Transportation Needs: No Transportation Needs (01/22/2024)  Utilities: Not At Risk (01/22/2024)  Tobacco  Use: Medium Risk (01/21/2024)   SDOH Interventions:     Readmission Risk Interventions     No data to display

## 2024-01-22 NOTE — Progress Notes (Signed)
 Same day progress note  I responded to a secure chat that was sent to me prior to my shifts starting.  Patient has reported a headache and left-sided facial numbness since around 2:45 PM.  The bedside team did not notify of this to anybody until about 6:20 PM, which was via a secure chat to me (while I was not on shift) and not to the neurologists that were on shift during the daytime.  Upon my arrival to the hospital and reception of the secure chat, I evaluated the patient.  She had sensory symptoms-diminished sensation on the left face and tongue that she reported subjectively.  Other than that, the examination was unremarkable.  I ordered a stat MRI of the brain which was completed.  The left cerebellar infarct was redemonstrated.  No new areas of stroke are identified on this study. I suspect that the sensory symptoms may be related to the headache.  I will treat her with a migraine cocktail.  I will have the stroke team continue to follow her tomorrow to complete the stroke workup. Preliminary plan was discussed with Dr. Willette, who had also called me around 7:20 PM.  Eligio Lav, MD Neurology

## 2024-01-22 NOTE — Assessment & Plan Note (Signed)
 Left vertebral artery dissection  -possibly associated ischemic infarct in the left cerebellum  - per CT finding -MRI also reviewed -Continue neurochecks -Appreciate neurology review and recommendations

## 2024-01-22 NOTE — Progress Notes (Signed)
 PT Cancellation Note  Patient Details Name: [name] MRN: [id number] DOB: October 26, 1980   Cancelled Treatment:    Reason Eval/Treat Not Completed: PT screened, no needs identified, will sign off; patient reports resolution of weakness and has been up to bathe herself and walk without dizziness or balance issues.  RN confirms.  PT will sign off as no needs identified.    Montie Portal 01/22/2024, 12:58 PM Micheline Portal, PT Acute Rehabilitation Services Office:[phone] 01/22/2024

## 2024-01-22 NOTE — Assessment & Plan Note (Signed)
 Symptoms almost improved   MRI appearance of the Distal left vertebral artery compatible with thrombosis, such as due to dissection. 2. Cannot exclude a punctate acute infarct in the left cerebellum, but No other acute intracranial abnormality is identified. - N.p.o. - PT/OT/speech consulted for evaluate - Neurology consulted, following closely-current recommendation aspirin  81 mg plus Plavix  75 mg daily - Repeat CTA head and neck 3-4 months Obtaining 2D echocardiogram-labs A1c, lipid panel, TSH, Plan for permissive hypertension Appreciate stroke team follow-up

## 2024-01-22 NOTE — Consult Note (Addendum)
 NEUROLOGY CONSULT NOTE   Date of service: January 22, 2024 Patient Name: [name] MRN:  [id number] DOB:  03-18-1980 Chief Complaint: Intractable headache Requesting Provider: Sundil, Subrina, MD  History of Present Illness  [name] is a 44 y.o. female with hx of asthma, seasonal allergies and GERD, presented to freestanding ER [facility] with complaints of headache that has been unremitting for the past 2 days.  She said that she noticed a headache the morning prior to presentation-Monday, which started in the front of her head and did not improve.  Later, the headache became dull and persistent but she also started noticing numbness on the left side of her body.  She also noticed swerving to the left side while walking.  Continued to have the symptoms while in the freestanding ER.  Symptoms resolved upon arrival to [facility]. No history of headache in the past.  No neck manipulation.  No recent injury to the head or trauma to the neck.  Denies any neck pain or pain in the back of her head.  No prior history of similar episodes.  No sick contacts.  No fevers chills.  Continued to have left-sided numbness and weakness in the freestanding ER.  CT head unremarkable.  CT angio head and neck concerning for left vertebral artery dissection intradurally.  Transferred for neurological evaluation and further workup.  LKW: Monday, 01/20/2024 sometime in the morning Modified rankin score: 0 IV Thrombolysis: No-outside the window EVT: No-outside the window NIH stroke scale 0  ROS  Comprehensive ROS performed and pertinent positives documented in HPI    Past History   Past Medical History:  Diagnosis Date   Asthma     reservations to 2-3 timmes  a year that required ED visits/hospitalizations. Never intubated   GERD (gastroesophageal reflux disease)    Seasonal allergies     Past Surgical History:  Procedure Laterality Date   CESAREAN SECTION     CESAREAN  SECTION N/A 10/25/2012   Procedure: CESAREAN SECTION;  Surgeon: Rexene PARAS. Rosalva, MD;  Location: [facility] ORS;  Service: Obstetrics;  Laterality: N/A;   CESAREAN SECTION N/A 03/05/2014   Procedure: CESAREAN SECTION;  Surgeon: Jon CINDERELLA Rummer, MD;  Location: [facility] ORS;  Service: Obstetrics;  Laterality: N/A;   CESAREAN SECTION WITH BILATERAL TUBAL LIGATION N/A 11/06/2019   Procedure: CESAREAN SECTION WITH BILATERAL TUBAL LIGATION;  Surgeon: Armond Cape, MD;  Location: [facility] ORS;  Service: Obstetrics;  Laterality: N/A;   CHOLECYSTECTOMY N/A 04/09/2017   Procedure: LAPAROSCOPIC CHOLECYSTECTOMY;  Surgeon: Rubin Calamity, MD;  Location[facility] OR;  Service: General;  Laterality: N/A;   FRACTURE SURGERY     left middle finger    Family History: Family History  Problem Relation Age of Onset   Hypertension Mother    Allergies Mother    Sleep apnea Father    Hyperlipidemia Father    Allergies Maternal Grandmother    Asthma Maternal Grandmother    Diabetes Maternal Grandmother     Social History  reports that she quit smoking about 23 years ago. Her smoking use included cigarettes. She started smoking about 23 years ago. She has a 0.2 pack-year smoking history. She has never used smokeless tobacco. She reports that she does not drink alcohol and does not use drugs.  Allergies[1]  Medications  Current Medications[2]  Vitals   Vitals:   01/22/24 0000 01/22/24 0052 01/22/24 0200 01/22/24 0258  BP: (!) 152/93 (!) 152/93 (!) 171/94 (!) 178/106  Pulse:  (!) 54 89 63  Resp: 13 20 16 16   Temp:  97.9 F (36.6 C)  98.3 F (36.8 C)  TempSrc:  Oral  Oral  SpO2:  98% 94% 100%  Weight:      Height:        Body mass index is 37.12 kg/m.   Physical Exam  General: Awake alert in no distress HEENT: Normocephalic atraumatic Lungs: Clear Cardiovascular: Regular rate rhythm Abdomen nondistended nontender Neurological exam Awake alert oriented x 3 No aphasia No dysarthria Cranial nerves II to XII  intact Motor examination with no drift in any of the 4 extremities.  Full strength 5/5 in all 4 extremities Sensory examination: Intact to light touch with no extinction Coordination reveals no dysmetria Gait testing deferred   Labs/Imaging/Neurodiagnostic studies   CBC:  Recent Labs  Lab 2024/01/22 1805  WBC 6.7  NEUTROABS 4.5  HGB 11.4*  HCT 35.6*  MCV 80.7  PLT 368   Basic Metabolic Panel:  Lab Results  Component Value Date   NA 139 2024-01-22   K 3.9 January 22, 2024   CO2 26 Jan 22, 2024   GLUCOSE 111 (H) 2024/01/22   BUN 15 January 22, 2024   CREATININE 0.85 Jan 22, 2024   CALCIUM  9.5 01/22/24   GFRNONAA >60 22-Jan-2024   GFRAA >60 08/24/2019   Lipid Panel:  Lab Results  Component Value Date   LDLCALC 98 08/25/2014   Urine Drug Screen:     Component Value Date/Time   LABOPIA NEGATIVE 22-Jan-2024 1831   COCAINSCRNUR NEGATIVE January 22, 2024 1831   LABBENZ NEGATIVE Jan 22, 2024 1831   AMPHETMU NEGATIVE 01-22-24 1831   THCU NEGATIVE 01-22-24 1831   LABBARB NEGATIVE Jan 22, 2024 1831    Alcohol Level     Component Value Date/Time   ETH <15 22-Jan-2024 1805   INR  Lab Results  Component Value Date   INR 1.0 01/22/2024   APTT  Lab Results  Component Value Date   APTT 30 22-Jan-2024   CT Head without contrast(Personally reviewed): Negative head CT  CT angio Head and Neck with contrast(Personally reviewed): Occluded left V3 which may be due to dissection given paucity of atherosclerosis elsewhere.  MRI Brain(Personally reviewed): Abnormal MRI appearance of distal left vertebral artery compatible with thrombosis such as due to dissection.  Cannot exclude a punctate acute infarct in the left cerebellum-but no other acute intracranial abnormality identified.  CT venogram head: No evidence of dural venous sinus thrombus  ASSESSMENT   [name] is a 44 y.o. female with no significant cerebrovascular risk factors, presented for evaluation of sudden onset of  intractable frontal headache and left-sided numbness for over a day.  On examination at the freestanding ER, per report, she had some left-sided numbness and weakness.  She had a head CT, CT angio head and neck and CT venogram at the outside hospital.  Her symptoms resolved after the ED stay.  Upon transfer to [facility], her NIH stroke scale is 0.  She reports significant improvement in symptoms.  She did not report any neck pain or pain in the back of her head which is unusual not to have with vertebral artery dissection. Her CT angiography head and neck showed an occluded left V3 segment of the vertebral artery which might be due to dissection.  MRI of the brain again showed abnormal appearance of the left vertebral artery compatible with thrombosis such as due to dissection and possible acute infarct in the left cerebellum.  Unclear if the headache is connected directly  to the dissection/stroke or not. Denies any trauma history.  Denies any history of neck manipulation. 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  RECOMMENDATIONS  Admit to hospitalist Frequent neurochecks Recommended loading with aspirin  and Plavix  when the CT angio results were made available to me over the phone prior to patient arrival and prior to patient being evaluated by me. Continue aspirin  81+ Plavix  75 Will need repeat CTA head and neck in 3 to 4 months and outpatient neurology follow-up to evaluate for stability of the dissection and resolution of the thrombus. Will complete the stroke workup-obtain 2D echo, A1c, lipid panel. Therapy assessments N.p.o. until cleared by bedside swallow evaluation. Stroke team will follow Plan relayed to Dr. Sundil ______________________________________________________________________    Signed, Eligio Lav, MD [facility]     [1]  Allergies Allergen Reactions   Iohexol  Hives  and Itching        Moxifloxacin Hives and Itching  [2]  Current Facility-Administered Medications:    acetaminophen  (TYLENOL ) tablet 650 mg, 650 mg, Oral, Q6H PRN, Sundil, Subrina, MD   albuterol  (PROVENTIL ) (2.5 MG/3ML) 0.083% nebulizer solution 2.5 mg, 2.5 mg, Inhalation, Q6H PRN, Sundil, Subrina, MD   famotidine  (PEPCID ) tablet 40 mg, 40 mg, Oral, Daily, Sundil, Subrina, MD, 40 mg at 01/22/24 9667   fluticasone  furoate-vilanterol (BREO ELLIPTA ) 100-25 MCG/ACT 1 puff, 1 puff, Inhalation, Daily, Sundil, Subrina, MD   hydrALAZINE  (APRESOLINE ) injection 10 mg, 10 mg, Intravenous, Q6H PRN, Sundil, Subrina, MD   ketorolac  (TORADOL ) 15 MG/ML injection 7.5 mg, 7.5 mg, Intravenous, Q6H PRN, Sundil, Subrina, MD   montelukast  (SINGULAIR ) tablet 10 mg, 10 mg, Oral, QHS, Sundil, Subrina, MD   prochlorperazine  (COMPAZINE ) tablet 5 mg, 5 mg, Oral, Once, Patsey Lot, MD

## 2024-01-22 NOTE — Assessment & Plan Note (Signed)
 But holding home medication of Diovan for now -Allow for permissive hypertension

## 2024-01-22 NOTE — TOC CAGE-AID Note (Signed)
 Transition of Care [facility]) - CAGE-AID Screening   Patient Details  Name: [name] MRN: [id number] Date of Birth: [birth year]  Transition of Care [facility]) CM/SW Contact:    Andrez JULIANNA George, RN Phone Number: 01/22/2024, 11:58 AM   Clinical Narrative: Pt denies alcohol and substance use. Resources not needed   CAGE-AID Screening:    Have You Ever Felt You Ought to Cut Down on Your Drinking or Drug Use?: No Have People Annoyed You By Critizing Your Drinking Or Drug Use?: No Have You Felt Bad Or Guilty About Your Drinking Or Drug Use?: No Have You Ever Had a Drink or Used Drugs First Thing In The Morning to Steady Your Nerves or to Get Rid of a Hangover?: No CAGE-AID Score: 0  Substance Abuse Education Offered: No

## 2024-01-22 NOTE — Hospital Course (Addendum)
 [name] is a  44 yr old female with HTN, asthma, seasonal allergies, GERD, PUD,  presents to [facility] with sever headache and left-sided.  Headache started about 2 days ago progressed to transient left-sided weakness and numbness which prompted her to come to the ED.   She reports of no associated double vision, denies any recent illnesses fever chills or congestion.  Denies any upper or lower extremity pain.  Denies any neck pain   CT head unremarkable.  CT angio head and neck concerning for left vertebral artery dissection intradurally.   Transferred for neurological evaluation and further workup.   Upon arrival: Blood pressure (!) 178/106, pulse 63, temperature 98.3 F (36.8 C), RR 16, height  SpO2 100%,   Symptoms almost resolved !  Labs, CBC CMP within normal limits with a glucose of 126, urine drug screen negative, pregnancy test negative alcohol less than 15   MRI appearance of the Distal left vertebral artery compatible with thrombosis, such as due to dissection. Cannot exclude a punctate acute infarct in the left cerebellum, but No other acute intracranial abnormality is identified.   Status post evaluation by neurology Dr. Voncile  Started aspirin  and Plavix  recommended admission for further evaluation of stroke

## 2024-01-22 NOTE — Progress Notes (Signed)
 OT Cancellation Note  Patient Details Name: MELINA MOSTELLER MRN: [id number] DOB: [birth year]   Cancelled Treatment:    Reason Eval/Treat Not Completed: OT screened, no needs identified, will sign off. Patient / RN reports patient is moving around at a modified independent level around room. OT observed patient mobilize without assist. Patient reports no visual deficits, UE/ LE sensory deficits, coordination deficits, and is back at her baseline. Patient Patient UE strength WFL, patient asked for squeeze ball OT provided.  D'mariea L Kendre Sires 01/22/2024, 3:32 PM D'mariea Kacyn Souder OTD, OTR/L

## 2024-01-22 NOTE — Assessment & Plan Note (Signed)
"  Continue PPI  "

## 2024-01-23 ENCOUNTER — Other Ambulatory Visit (HOSPITAL_COMMUNITY): Payer: Self-pay

## 2024-01-23 DIAGNOSIS — I63212 Cerebral infarction due to unspecified occlusion or stenosis of left vertebral arteries: Secondary | ICD-10-CM

## 2024-01-23 DIAGNOSIS — I7774 Dissection of vertebral artery: Secondary | ICD-10-CM

## 2024-01-23 DIAGNOSIS — E785 Hyperlipidemia, unspecified: Secondary | ICD-10-CM

## 2024-01-23 DIAGNOSIS — I10 Essential (primary) hypertension: Secondary | ICD-10-CM

## 2024-01-23 LAB — BASIC METABOLIC PANEL WITH GFR
Anion gap: 8 (ref 5–15)
BUN: 24 mg/dL — ABNORMAL HIGH (ref 6–20)
CO2: 27 mmol/L (ref 22–32)
Calcium: 8.8 mg/dL — ABNORMAL LOW (ref 8.9–10.3)
Chloride: 104 mmol/L (ref 98–111)
Creatinine, Ser: 1.06 mg/dL — ABNORMAL HIGH (ref 0.44–1.00)
GFR, Estimated: >60 mL/min
Glucose, Bld: 106 mg/dL — ABNORMAL HIGH (ref 70–99)
Potassium: 3.6 mmol/L (ref 3.5–5.1)
Sodium: 139 mmol/L (ref 135–145)

## 2024-01-23 LAB — APTT: aPTT: 31 s (ref 24–36)

## 2024-01-23 MED ORDER — CLOPIDOGREL BISULFATE 75 MG PO TABS
75.0000 mg | ORAL_TABLET | Freq: Every day | ORAL | 0 refills | Status: AC
  Filled 2024-01-23: qty 90, 90d supply, fill #0

## 2024-01-23 MED ORDER — BUTALBITAL-APAP-CAFFEINE 50-325-40 MG PO TABS
1.0000 | ORAL_TABLET | Freq: Two times a day (BID) | ORAL | 0 refills | Status: AC | PRN
  Filled 2024-01-23: qty 10, 5d supply, fill #0

## 2024-01-23 MED ORDER — ATORVASTATIN CALCIUM 40 MG PO TABS
40.0000 mg | ORAL_TABLET | Freq: Every day | ORAL | 0 refills | Status: AC
  Filled 2024-01-23: qty 90, 90d supply, fill #0

## 2024-01-23 MED ORDER — ASPIRIN 81 MG PO TBEC
81.0000 mg | DELAYED_RELEASE_TABLET | Freq: Every day | ORAL | 1 refills | Status: AC
  Filled 2024-01-23: qty 90, 90d supply, fill #0

## 2024-01-23 NOTE — TOC Transition Note (Signed)
 Transition of Care [facility]) - Discharge Note   Patient Details  Name: [name] MRN: [id number] Date of Birth: April 14, 1980  Transition of [facility]) CM/SW Contact:  Andrez JULIANNA George, RN Phone Number: 01/23/2024, 2:32 PM   Clinical Narrative:     Pt is discharging home with self care. Financial counseling to follow up with pt for medicaid.  PCP appt on AVS.  Pt has transportation home.  Final next level of care: Home/Self Care Barriers to Discharge: Inadequate or no insurance, Barriers Unresolved (comment)   Patient Goals and CMS Choice            Discharge Placement                       Discharge Plan and Services Additional resources added to the After Visit Summary for     Discharge Planning Services: CM Consult                                 Social Drivers of Health (SDOH) Interventions SDOH Screenings   Food Insecurity: No Food Insecurity (01/22/2024)  Housing: Low Risk (01/22/2024)  Transportation Needs: No Transportation Needs (01/22/2024)  Utilities: Not At Risk (01/22/2024)  Tobacco Use: Medium Risk (01/21/2024)     Readmission Risk Interventions     No data to display

## 2024-01-23 NOTE — Discharge Summary (Signed)
 Physician Discharge Summary  [name] FMW:[id number] DOB: Mar 21, 1980 DOA: 01/21/2024  PCP: Cynthia Ingle, MD  Admit date: 01/21/2024 Discharge date: 01/23/2024  Time spent: 40 minutes  Recommendations for Outpatient Follow-up:  Follow outpatient CBC/CMP  Follow with neurology outpatient Needs CTA in 1-2 months to follow left vertebral artery Attention to HTN outpatient, adjust regimen as needed   Discharge Diagnoses:  Principal Problem:   Stroke (cerebrum) (HCC) Active Problems:   Artery dissection   Morbid obesity with BMI of 40.0-44.9, adult (HCC)   Peptic ulcer disease   Hypertension   Headache   Discharge Condition: stable  Diet recommendation: heart healthy  Filed Weights   01/21/24 1750  Weight: 104.3 kg    History of present illness:   44 yo with hx HTN, asthma, GERD, peptic ulcer disease who presented with severe headache and L sided facial numbness as well as weakness.  Head imaging showed [name] L cerebellar infarct as well as an occluded L vertebral artery suspected due to dissection.   Now s/p neurology evaluation.  Stable for discharge on DAPT x3 months, then transition to aspirin .  Statin.  Outpatient neurology follow up.    Hospital Course:  Assessment and Plan:  Acute Stroke Left Vertebral Dissection  MRI acute L cerebellar infarct, thrombosed L V3 vertebral artery CT venogram without dural venous sinus thrombosis CTA head/neck with occluded L V3 vertebral artery, possibly due to dissection  Echo with EF 70-75%, no RWMA LDL 99, A1c 5.5, TSH wnl Appreciate neurology assistance.  L cerebellar infarct likely related to L V3 occlusion concerning for dissection.  DAPTx3 months followed by aspirin  alone.  Needs repeat CTA in 1-2 months to monitor L vertebral artery.  Headache In setting of above APAP prn, fioricet  prescribed for short term use  Hypertension Ok to resume BP at discharge  Dyslipidemia Statin  PUD Follow outpatient, not on any  chronic PPI - I don't see any chart hx of endoscopy Follow outpatient  Class II Obesity Body mass index is 37.12 kg/m.     Procedures: Echo IMPRESSIONS     1. Left ventricular ejection fraction, by estimation, is 70 to 75%. The  left ventricle has hyperdynamic function. The left ventricle has no  regional wall motion abnormalities. Left ventricular diastolic parameters  were normal.   2. Right ventricular systolic function is normal. The right ventricular  size is normal.   3. The mitral valve is normal in structure. Trivial mitral valve  regurgitation. No evidence of mitral stenosis.   4. The aortic valve is tricuspid. There is mild calcification of the  aortic valve. Aortic valve regurgitation is not visualized. Aortic valve  sclerosis/calcification is present, without any evidence of aortic  stenosis.   5. The inferior vena cava is normal in size with greater than 50%  respiratory variability, suggesting right atrial pressure of 3 mmHg.   Consultations: neurology  Discharge Exam: Vitals:   01/23/24 0837 01/23/24 1125  BP:  (!) 160/94  Pulse: 71 77  Resp: 18 16  Temp:  98.3 F (36.8 C)  SpO2: 99% 100%   Has residual headache and L facial numbness  General: No acute distress. Cardiovascular: RRR Lungs: unlabored Neurological: Alert and oriented 3. Decreased sensation to light touch in V2/V3 region on L.  Symmetric strength, FNF and heel to shin intact.   Extremities: No clubbing or cyanosis. No edema.  Discharge Instructions   Discharge Instructions     Ambulatory referral to Neurology   Complete by: As  directed    Follow up with stroke clinic NP at [facility] in about 4-6 weeks. Thanks.   Call MD for:  difficulty breathing, headache or visual disturbances   Complete by: As directed    Call MD for:  extreme fatigue   Complete by: As directed    Call MD for:  hives   Complete by: As directed    Call MD for:  persistant dizziness or light-headedness    Complete by: As directed    Call MD for:  persistant nausea and vomiting   Complete by: As directed    Call MD for:  redness, tenderness, or signs of infection (pain, swelling, redness, odor or green/yellow discharge around incision site)   Complete by: As directed    Call MD for:  severe uncontrolled pain   Complete by: As directed    Call MD for:  temperature >100.4   Complete by: As directed    Discharge instructions   Complete by: As directed    You were seen for [name] stroke.  This was related to [name] suspected vertebral dissection.  We're treating you with aspirin  and plavix  for 3 months, then you'll transition to aspirin  alone.  We've started you on lipitor as well.  You'll need [name] repeat head CT in 1-2 months to follow your left vertebral artery.  Resume your home losartan at discharge.  Continued outpatient management of your blood pressure outpatient will be important.    Use tylenol  as needed for your headache.  For headaches not improved with tylenol , you can use fioricet .  Since you're taking aspirin  and plavix , AVOID NSAIDS (ibuprofen , aleve , naproxen , etc).   You'll need to follow with neurology outpatient in 4 weeks.   Return for new, recurrent, or worsening symptoms.  Please ask your PCP to request records from this hospitalization so they know what was done and what the next steps will be.   Increase activity slowly   Complete by: As directed       Allergies as of 01/23/2024       Reactions   Iohexol  Hives, Itching       Moxifloxacin Hives, Itching   Other Itching   Pet Dander (dogs, cats) - causes her asthma to flare up        Medication List     STOP taking these medications    ibuprofen  200 MG tablet Commonly known as: ADVIL        TAKE these medications    aspirin  EC 81 MG tablet Take 1 tablet (81 mg total) by mouth daily. Swallow whole. Start taking on: January 24, 2024   atorvastatin  40 MG tablet Commonly known as: LIPITOR Take 1 tablet  (40 mg total) by mouth daily. Start taking on: January 24, 2024   budesonide -formoterol  80-4.5 MCG/ACT inhaler Commonly known as: Symbicort  Take 2 puffs first thing in am and then another 2 puffs about 12 hours later.   butalbital -acetaminophen -caffeine  50-325-40 MG tablet Commonly known as: FIORICET  Take 1 tablet by mouth 2 (two) times daily as needed for headache. (Use for headaches not improved with tylenol ).  Try to avoid using more than twice per week to avoid rebound headaches.   cetirizine  10 MG tablet Commonly known as: ZYRTEC  Take 1 tablet (10 mg total) by mouth daily.   clopidogrel  75 MG tablet Commonly known as: PLAVIX  Take 1 tablet (75 mg total) by mouth daily. Start taking on: January 24, 2024   diclofenac  Sodium 1 % Gel Commonly known as: Voltaren  Apply  2 g topically 4 (four) times daily.   famotidine  20 MG tablet Commonly known as: PEPCID  TAKE 1 TABLET BY MOUTH AFTER SUPPER   montelukast  10 MG tablet Commonly known as: SINGULAIR  Take 1 tablet (10 mg total) by mouth at bedtime.   multivitamin with minerals Tabs tablet Take 1 tablet by mouth daily.   ProAir  HFA 108 (90 Base) MCG/ACT inhaler Generic drug: albuterol  INHALE 2 PUFFS BY MOUTH EVERY 4 HOURS AS NEEDED FOR WHEEZING AND FOR SHORTNESS OF BREATH   albuterol  (2.5 MG/3ML) 0.083% nebulizer solution Commonly known as: PROVENTIL  Take 3 mLs (2.5 mg total) by nebulization every 6 (six) hours as needed for wheezing or shortness of breath.   valsartan 40 MG tablet Commonly known as: DIOVAN Take 40 mg by mouth daily.       Allergies[1]  Follow-up Information     Health insurance shop Follow up.   Why: call to talk about [employer] Contact information: [phone]        [facility] Follow up on 02/28/2024.   Specialty: Family Medicine Why: Your appointment is at 8:30 am. please arrive early and bring: picture ID, insurance card and current  medications Contact information: [address] [place] [facility] [place]  [address] [phone] Additional information: [address]  [place], [place] [address]        [facility]. Schedule an appointment as soon as possible for Rosangelica Pevehouse visit in 1 month(s).   Specialty: Neurology Why: stroke clinic Contact information: [address] [place]  [address] [phone]                 The results of significant diagnostics from this hospitalization (including imaging, microbiology, ancillary and laboratory) are listed below for reference.    Significant Diagnostic Studies: MR BRAIN WO CONTRAST Result Date: 01/22/2024 EXAM: MRI BRAIN WITHOUT CONTRAST 01/22/2024 08:30:49 PM TECHNIQUE: Multiplanar multisequence MRI of the head/brain was performed without the administration of intravenous contrast. COMPARISON: CT head and CTA earlier today CLINICAL HISTORY: Stroke, follow up FINDINGS: BRAIN AND VENTRICLES: Punctate acute left cerebellar infarct. No intracranial hemorrhage. No mass. No midline shift. No hydrocephalus. The sella is unremarkable. Redemonstrated thrombosed left V3 vertebral artery with abnormal flow void and susceptibility artifact. ORBITS: No significant abnormality. SINUSES AND MASTOIDS: No significant abnormality. BONES AND SOFT TISSUES: Normal marrow signal. No significant soft tissue abnormality. IMPRESSION: 1. Punctate acute left cerebellar infarct. 2. Redemonstrated thrombosed left V3 vertebral artery. Electronically signed by: Gilmore Molt MD 01/22/2024 10:15 PM EST RP Workstation: [id number]   ECHOCARDIOGRAM COMPLETE Result Date: 01/22/2024    ECHOCARDIOGRAM REPORT   Patient Name:   LINNETTE PANELLA Casillas Date of Exam: 01/22/2024 Medical Rec #:  [id number]       Height:       66.0 in Accession #:    [id number]      Weight:       230.0 lb Date of Birth:  1980/02/13       BSA:          2.122 m Patient Age:    43 years         BP:           110/60 mmHg Patient Gender: F               HR:           74 bpm. Exam Location:  Inpatient Procedure: 2D Echo, Cardiac Doppler and Color Doppler (Both Spectral  and Color            Flow Doppler were utilized during procedure). Indications:    Stroke  History:        Patient has no prior history of Echocardiogram examinations.                 Signs/Symptoms:Dyspnea; Risk Factors:Hypertension.  Sonographer:    Sherlean Dubin Referring Phys: [phone] SEYED Conya Ellinwood SHAHMEHDI IMPRESSIONS  1. Left ventricular ejection fraction, by estimation, is 70 to 75%. The left ventricle has hyperdynamic function. The left ventricle has no regional wall motion abnormalities. Left ventricular diastolic parameters were normal.  2. Right ventricular systolic function is normal. The right ventricular size is normal.  3. The mitral valve is normal in structure. Trivial mitral valve regurgitation. No evidence of mitral stenosis.  4. The aortic valve is tricuspid. There is mild calcification of the aortic valve. Aortic valve regurgitation is not visualized. Aortic valve sclerosis/calcification is present, without any evidence of aortic stenosis.  5. The inferior vena cava is normal in size with greater than 50% respiratory variability, suggesting right atrial pressure of 3 mmHg. FINDINGS  Left Ventricle: Left ventricular ejection fraction, by estimation, is 70 to 75%. The left ventricle has hyperdynamic function. The left ventricle has no regional wall motion abnormalities. The left ventricular internal cavity size was normal in size. There is no left ventricular hypertrophy. Left ventricular diastolic parameters were normal. Right Ventricle: The right ventricular size is normal. No increase in right ventricular wall thickness. Right ventricular systolic function is normal. Left Atrium: Left atrial size was normal in size. Right Atrium: Right atrial size was normal in size. Pericardium: There is no evidence of pericardial  effusion. Mitral Valve: The mitral valve is normal in structure. Trivial mitral valve regurgitation. No evidence of mitral valve stenosis. Tricuspid Valve: The tricuspid valve is normal in structure. Tricuspid valve regurgitation is trivial. No evidence of tricuspid stenosis. Aortic Valve: The aortic valve is tricuspid. There is mild calcification of the aortic valve. Aortic valve regurgitation is not visualized. Aortic valve sclerosis/calcification is present, without any evidence of aortic stenosis. Aortic valve mean gradient measures 4.5 mmHg. Aortic valve peak gradient measures 8.5 mmHg. Aortic valve area, by VTI measures 3.25 cm. Pulmonic Valve: The pulmonic valve was normal in structure. Pulmonic valve regurgitation is not visualized. No evidence of pulmonic stenosis. Aorta: The aortic root is normal in size and structure. Venous: The inferior vena cava is normal in size with greater than 50% respiratory variability, suggesting right atrial pressure of 3 mmHg. IAS/Shunts: No atrial level shunt detected by color flow Doppler.  LEFT VENTRICLE PLAX 2D LVIDd:         4.80 cm   Diastology LVIDs:         2.80 cm   LV e' medial:    7.30 cm/s LV PW:         0.80 cm   LV E/e' medial:  11.1 LV IVS:        1.00 cm   LV e' lateral:   12.60 cm/s LVOT diam:     2.10 cm   LV E/e' lateral: 6.4 LV SV:         88 LV SV Index:   42 LVOT Area:     3.46 cm  RIGHT VENTRICLE             IVC RV Basal diam:  4.50 cm     IVC diam: 1.70 cm RV Mid diam:  4.00 cm RV S prime:     19.90 cm/s TAPSE (M-mode): 1.8 cm LEFT ATRIUM             Index        RIGHT ATRIUM           Index LA diam:        4.10 cm 1.93 cm/m   RA Area:     18.90 cm LA Vol (A2C):   60.8 ml 28.65 ml/m  RA Volume:   49.40 ml  23.28 ml/m LA Vol (A4C):   53.9 ml 25.40 ml/m LA Biplane Vol: 56.5 ml 26.62 ml/m  AORTIC VALVE AV Area (Vmax):    3.31 cm AV Area (Vmean):   3.15 cm AV Area (VTI):     3.25 cm AV Vmax:           145.50 cm/s AV Vmean:          97.500 cm/s  AV VTI:            0.272 m AV Peak Grad:      8.5 mmHg AV Mean Grad:      4.5 mmHg LVOT Vmax:         139.00 cm/s LVOT Vmean:        88.650 cm/s LVOT VTI:          0.255 m LVOT/AV VTI ratio: 0.94  AORTA Ao Root diam: 3.00 cm Ao Asc diam:  2.60 cm MITRAL VALVE               TRICUSPID VALVE MV Area (PHT): 3.21 cm    TR Peak grad:   4.8 mmHg MV Decel Time: 236 msec    TR Vmax:        109.00 cm/s MR Peak grad: 7.6 mmHg MR Vmax:      138.00 cm/s  SHUNTS MV E velocity: 81.00 cm/s  Systemic VTI:  0.26 m MV Cyan Moultrie velocity: 85.30 cm/s  Systemic Diam: 2.10 cm MV E/Keelynn Furgerson ratio:  0.95 Toribio Fuel MD Electronically signed by Toribio Fuel MD Signature Date/Time: 01/22/2024/3:04:34 PM    Final    MR BRAIN WO CONTRAST Result Date: 01/22/2024 EXAM: MRI BRAIN WITHOUT CONTRAST 01/22/2024 04:18:08 AM TECHNIQUE: Multiplanar multisequence MRI of the head/brain was performed without the administration of intravenous contrast. COMPARISON: CT head yesterday, CTA and CT venogram earlier today. CLINICAL HISTORY: 44 year old female. Possible distal left vertebral artery dissection/occlusion, severe headache, off balance. FINDINGS: BRAIN AND VENTRICLES: The flow void of the distal left vertebral artery is abnormal near the craniocervical junction, concordant with CTA appearance (series 10 image 2). Other major vascular flow voids appear preserved. Difficult to exclude punctate diffusion restriction in the left cerebellum on series 5 image 62 (and series 6 image 12), but no associated T2 or FLAIR changes there. No other abnormal diffusion, no other acute infarction identified. Background gray and white matter signal is normal for age. The deep gray nuclei, brainstem, and cerebellum appear negative except for minimal nonspecific T2 hyperintensity in the left deep cerebellar nuclei on series 10 image 7. No other encephalomalacia, and no chronic intra axial blood products identified. But there is abnormal susceptibility associated with the distal  left vertebral artery in the posterior fossa (series 12 images 9 through 13) compatible with thrombosis. No intracranial hemorrhage. No mass. No midline shift. No hydrocephalus. The sella is unremarkable. ORBITS: No abnormality. SINUSES AND MASTOIDS: Paranasal sinuses and mastoids are well aerated. BONES AND SOFT TISSUES: Normal marrow signal. No acute soft  tissue abnormality. Negative visible cervical spine. Grossly normal visible internal auditory structures. IMPRESSION: 1. Abnormal MRI appearance of the Distal left vertebral artery compatible with thrombosis, such as due to dissection. 2. Cannot exclude Darlis Wragg punctate acute infarct in the left cerebellum, but No other acute intracranial abnormality is identified. Electronically signed by: Helayne Hurst MD 01/22/2024 04:32 AM EST RP Workstation: [id number]   CT VENOGRAM HEAD Result Date: 01/22/2024 EXAM: CT VENOGRAM WITH CONTRAST 01/22/2024 01:40:52 AM TECHNIQUE: CT venogram of the head/brain was performed with the administration of intravenous contrast. Multiplanar reformatted images are provided for review. MIP images are provided for review. Automated exposure control, iterative reconstruction, and/or weight based adjustment of the mA/kV was utilized to reduce the radiation dose to as low as reasonably achievable. COMPARISON: None available. CLINICAL HISTORY: Dural venous sinus thrombosis suspected FINDINGS: No dural venous sinus thrombosis. No significant stenosis. IMPRESSION: 1. No dural venous sinus thrombosis. Electronically signed by: Gilmore Molt 01/22/2024 02:11 AM EST RP Workstation: [id number]   CT ANGIO HEAD NECK W WO CM Result Date: 01/22/2024 EXAM: CTA HEAD AND NECK WITH AND WITHOUT 01/22/2024 01:40:52 AM TECHNIQUE: CTA of the head and neck was performed with and without the administration of intravenous contrast. Multiplanar 2D and/or 3D reformatted images are provided for review. Automated exposure control, iterative reconstruction, and/or weight  based adjustment of the mA/kV was utilized to reduce the radiation dose to as low as reasonably achievable. Stenosis of the internal carotid arteries measured using NASCET criteria. COMPARISON: None available CLINICAL HISTORY: Neuro deficit, acute, stroke suspected Neuro deficit, acute, stroke suspected FINDINGS: AORTIC ARCH AND ARCH VESSELS: No dissection or arterial injury. No significant stenosis of the brachiocephalic or subclavian arteries. CERVICAL CAROTID ARTERIES: No dissection, arterial injury, or hemodynamically significant stenosis by NASCET criteria. CERVICAL VERTEBRAL ARTERIES: Occluded left v3 vertebral artery. Right vertebral artery is patent without significant stenosis. LUNGS AND MEDIASTINUM: Unremarkable. SOFT TISSUES: No acute abnormality. BONES: No acute abnormality. ANTERIOR CIRCULATION: No significant stenosis of the internal carotid arteries. No significant stenosis of the anterior cerebral arteries. No significant stenosis of the middle cerebral arteries. No aneurysm. POSTERIOR CIRCULATION: No significant stenosis of the posterior cerebral arteries. No significant stenosis of the basilar artery. Left intradural vertebral artery is not opacified. Right vertebral artery is patent without stenosis. No aneurysm. OTHER: No dural venous sinus thrombosis on this non-dedicated study. Findings discussed with Dr. Raford via telephone at 2:04 AM. IMPRESSION: 1. Occluded left v3 vertebral artery which may be due to dissection given paucity of atherosclerosis elsewhere. 2. Otherwise, no significant stenosis. Electronically signed by: Gilmore Molt 01/22/2024 02:07 AM EST RP Workstation: [id number]   CT HEAD WO CONTRAST Result Date: 01/21/2024 EXAM: CT HEAD WITHOUT CONTRAST 01/21/2024 06:20:46 PM TECHNIQUE: CT of the head was performed without the administration of intravenous contrast. Automated exposure control, iterative reconstruction, and/or weight based adjustment of the mA/kV was utilized to  reduce the radiation dose to as low as reasonably achievable. COMPARISON: None available. CLINICAL HISTORY: Neurological deficit, acute, suspected stroke. Headache, imbalance, and decreased sensation to the left side of the body. FINDINGS: BRAIN AND VENTRICLES: There is no evidence of an acute infarct, intracranial hemorrhage, mass, midline shift, hydrocephalus, or extra-axial fluid collection. Cerebral volume is normal. ORBITS: No acute abnormality. SINUSES: No acute abnormality. SOFT TISSUES AND SKULL: No acute soft tissue abnormality. No skull fracture. IMPRESSION: 1. Negative head CT. Electronically signed by: Dasie Hamburg MD 01/21/2024 06:47 PM EST RP Workstation: [id number]    Microbiology: No results found for this  or any previous visit (from the past 240 hours).   Labs: Basic Metabolic Panel: Recent Labs  Lab 01/21/24 1805 01/22/24 0606 01/22/24 0913 01/23/24 0238  NA 139 136  --  139  K 3.9 3.8  --  3.6  CL 103 101  --  104  CO2 26 24  --  27  GLUCOSE 111* 173*  --  106*  BUN 15 15  --  24*  CREATININE 0.85 0.83  --  1.06*  CALCIUM  9.5 9.3  --  8.8*  MG  --   --  2.2  --   PHOS  --   --  3.1  --    Liver Function Tests: Recent Labs  Lab 01/21/24 1805 01/22/24 0606  AST 16 15  ALT 9 10  ALKPHOS 66 60  BILITOT <0.2 0.2  PROT 7.6 7.6  ALBUMIN 4.3 4.1   No results for input(s): LIPASE, AMYLASE in the last 168 hours. No results for input(s): AMMONIA in the last 168 hours. CBC: Recent Labs  Lab 01/21/24 1805 01/22/24 0606  WBC 6.7 9.8  NEUTROABS 4.5  --   HGB 11.4* 12.1  HCT 35.6* 37.3  MCV 80.7 80.6  PLT 368 384   Cardiac Enzymes: No results for input(s): CKTOTAL, CKMB, CKMBINDEX, TROPONINI in the last 168 hours. BNP: BNP (last 3 results) No results for input(s): BNP in the last 8760 hours.  ProBNP (last 3 results) No results for input(s): PROBNP in the last 8760 hours.  CBG: Recent Labs  Lab 01/21/24 1756 01/22/24 0851  GLUCAP  126* 101*       Signed:  Meliton Monte MD.  [facility] 01/23/2024, 2:35 PM       [1]  Allergies Allergen Reactions   Iohexol  Hives and Itching        Moxifloxacin Hives and Itching   Other Itching    Pet Dander (dogs, cats)  - causes her asthma to flare up

## 2024-01-24 ENCOUNTER — Other Ambulatory Visit: Payer: Self-pay

## 2024-01-24 ENCOUNTER — Emergency Department (HOSPITAL_COMMUNITY): Payer: MEDICAID

## 2024-01-24 ENCOUNTER — Emergency Department (HOSPITAL_COMMUNITY)
Admission: EM | Admit: 2024-01-24 | Discharge: 2024-01-24 | Disposition: A | Discharge status: Home / Self Care | Payer: MEDICAID | Attending: Emergency Medicine | Admitting: Emergency Medicine

## 2024-01-24 ENCOUNTER — Other Ambulatory Visit (HOSPITAL_COMMUNITY): Payer: Self-pay

## 2024-01-24 ENCOUNTER — Telehealth: Payer: Self-pay | Admitting: Internal Medicine

## 2024-01-24 DIAGNOSIS — Z7982 Long term (current) use of aspirin: Secondary | ICD-10-CM | POA: Insufficient documentation

## 2024-01-24 DIAGNOSIS — E876 Hypokalemia: Secondary | ICD-10-CM | POA: Insufficient documentation

## 2024-01-24 DIAGNOSIS — J45909 Unspecified asthma, uncomplicated: Secondary | ICD-10-CM | POA: Insufficient documentation

## 2024-01-24 DIAGNOSIS — R519 Headache, unspecified: Secondary | ICD-10-CM | POA: Insufficient documentation

## 2024-01-24 DIAGNOSIS — Z7901 Long term (current) use of anticoagulants: Secondary | ICD-10-CM | POA: Insufficient documentation

## 2024-01-24 LAB — CBC WITH DIFFERENTIAL/PLATELET
Abs Immature Granulocytes: 0.02 K/uL (ref 0.00–0.07)
Basophils Absolute: 0.1 K/uL (ref 0.0–0.1)
Basophils Relative: 1 %
Eosinophils Absolute: 0.1 K/uL (ref 0.0–0.5)
Eosinophils Relative: 1 %
HCT: 41.1 % (ref 36.0–46.0)
Hemoglobin: 12.9 g/dL (ref 12.0–15.0)
Immature Granulocytes: 0 %
Lymphocytes Relative: 24 %
Lymphs Abs: 1.9 K/uL (ref 0.7–4.0)
MCH: 25.5 pg — ABNORMAL LOW (ref 26.0–34.0)
MCHC: 31.4 g/dL (ref 30.0–36.0)
MCV: 81.4 fL (ref 80.0–100.0)
Monocytes Absolute: 0.6 K/uL (ref 0.1–1.0)
Monocytes Relative: 7 %
Neutro Abs: 5.5 K/uL (ref 1.7–7.7)
Neutrophils Relative %: 67 %
Platelets: 394 K/uL (ref 150–400)
RBC: 5.05 MIL/uL (ref 3.87–5.11)
RDW: 14.9 % (ref 11.5–15.5)
WBC: 8.1 K/uL (ref 4.0–10.5)
nRBC: 0 % (ref 0.0–0.2)

## 2024-01-24 LAB — COMPREHENSIVE METABOLIC PANEL WITH GFR
ALT: 19 U/L (ref 0–44)
AST: 20 U/L (ref 15–41)
Albumin: 4.4 g/dL (ref 3.5–5.0)
Alkaline Phosphatase: 68 U/L (ref 38–126)
Anion gap: 11 (ref 5–15)
BUN: 13 mg/dL (ref 6–20)
CO2: 26 mmol/L (ref 22–32)
Calcium: 9.8 mg/dL (ref 8.9–10.3)
Chloride: 100 mmol/L (ref 98–111)
Creatinine, Ser: 0.94 mg/dL (ref 0.44–1.00)
GFR, Estimated: >60 mL/min
Glucose, Bld: 107 mg/dL — ABNORMAL HIGH (ref 70–99)
Potassium: 3.3 mmol/L — ABNORMAL LOW (ref 3.5–5.1)
Sodium: 137 mmol/L (ref 135–145)
Total Bilirubin: 0.3 mg/dL (ref 0.0–1.2)
Total Protein: 8.6 g/dL — ABNORMAL HIGH (ref 6.5–8.1)

## 2024-01-24 LAB — MAGNESIUM: Magnesium: 2.2 mg/dL (ref 1.7–2.4)

## 2024-01-24 MED ORDER — HYDROCHLOROTHIAZIDE 12.5 MG PO TABS
25.0000 mg | ORAL_TABLET | Freq: Once | ORAL | Status: AC
  Administered 2024-01-24: 25 mg via ORAL
  Filled 2024-01-24 (×2): qty 2

## 2024-01-24 MED ORDER — LABETALOL HCL 5 MG/ML IV SOLN
10.0000 mg | Freq: Once | INTRAVENOUS | Status: AC
  Administered 2024-01-24: 10 mg via INTRAVENOUS
  Filled 2024-01-24: qty 4

## 2024-01-24 MED ORDER — POTASSIUM CHLORIDE CRYS ER 20 MEQ PO TBCR
40.0000 meq | EXTENDED_RELEASE_TABLET | Freq: Once | ORAL | Status: AC
  Administered 2024-01-24: 40 meq via ORAL
  Filled 2024-01-24: qty 2

## 2024-01-24 MED ORDER — BUTALBITAL-APAP-CAFFEINE 50-325-40 MG PO TABS: 1.0000 | ORAL_TABLET | Freq: Once | ORAL | Status: DC

## 2024-01-24 MED ORDER — HYDROCHLOROTHIAZIDE 25 MG PO TABS
25.0000 mg | ORAL_TABLET | Freq: Every day | ORAL | 0 refills | Status: AC
  Filled 2024-01-24: qty 30, 30d supply, fill #0

## 2024-01-24 MED ORDER — GABAPENTIN 300 MG PO CAPS
300.0000 mg | ORAL_CAPSULE | Freq: Once | ORAL | Status: AC
  Administered 2024-01-24: 300 mg via ORAL
  Filled 2024-01-24: qty 1

## 2024-01-24 MED ORDER — CLOPIDOGREL BISULFATE 75 MG PO TABS
75.0000 mg | ORAL_TABLET | Freq: Once | ORAL | Status: AC
  Administered 2024-01-24: 75 mg via ORAL
  Filled 2024-01-24: qty 1

## 2024-01-24 MED ORDER — METOCLOPRAMIDE HCL 5 MG/ML IJ SOLN
10.0000 mg | Freq: Once | INTRAMUSCULAR | Status: AC
  Administered 2024-01-24: 10 mg via INTRAVENOUS
  Filled 2024-01-24: qty 2

## 2024-01-24 MED ORDER — GABAPENTIN 300 MG PO CAPS
300.0000 mg | ORAL_CAPSULE | Freq: Three times a day (TID) | ORAL | 0 refills | Status: AC
  Filled 2024-01-24: qty 42, 14d supply, fill #0

## 2024-01-24 MED ORDER — GABAPENTIN 300 MG PO CAPS: 300.0000 mg | ORAL_CAPSULE | Freq: Three times a day (TID) | ORAL | 0 refills | Status: DC

## 2024-01-24 NOTE — ED Triage Notes (Signed)
 Pt was Dc'd yesterday after being dx with a mild stroke, pt reports today she has had an one sided HA, and hypertensive at home 200/100

## 2024-01-24 NOTE — Telephone Encounter (Signed)
 Unable to reach patient to schedule this appt. ( Tried 3 times)  Closing this order

## 2024-01-24 NOTE — ED Provider Notes (Signed)
 " [place] [facility] AT [facility] Provider Note  CSN: [id number] Arrival date & time: 01/24/24 1143  Chief Complaint(s) Headache and Hypertension  HPI [name] is a 44 y.o. female this is a 44 year old female here today for headache and hypertension.  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  She states that she is taking her valsartan at home, she has not yet started taking her clopidogrel  because she says that she was not supposed to start it till Monday.  Past Medical History Past Medical History:  Diagnosis Date   Asthma     reservations to 2-3 timmes  a year that required ED visits/hospitalizations. Never intubated   GERD (gastroesophageal reflux disease)    Seasonal allergies    Patient Active Problem List   Diagnosis Date Noted   Vertebral artery dissection 01/23/2024   Stroke (cerebrum) (HCC) 01/22/2024   Artery dissection 01/22/2024   Headache 01/21/2024   DOE (dyspnea on exertion) 05/10/2021   Hypertension 05/24/2020   Peptic ulcer disease 02/01/2019   Chronic low back pain 09/10/2016   Asthma 06/22/2010   Morbid obesity with BMI of 40.0-44.9, adult (HCC) 05/24/2006   Home Medication(s) Prior to Admission medications  Medication Sig Start Date End Date Taking? Authorizing Provider  gabapentin  (NEURONTIN ) 300 MG capsule Take 1 capsule (300 mg total) by mouth 3 (three) times daily. 01/24/24 02/07/24 Yes Mannie Pac T, DO  albuterol  (PROVENTIL ) (2.5 MG/3ML) 0.083% nebulizer solution Take 3 mLs (2.5 mg total) by nebulization every 6 (six) hours as needed for wheezing or shortness of breath. 05/10/21   Darlean Ozell NOVAK, MD  aspirin  EC 81 MG tablet Take 1 tablet (81 mg total) by mouth daily. Swallow whole. 01/24/24 07/22/24  Perri DELENA Meliton Mickey., MD  atorvastatin  (LIPITOR) 40 MG tablet Take 1 tablet (40 mg total) by mouth daily. 01/24/24 04/23/24  Perri DELENA Meliton Mickey., MD  budesonide -formoterol  (SYMBICORT ) 80-4.5 MCG/ACT inhaler Take 2 puffs first thing in am and then another 2 puffs about 12 hours later. 10/16/23   Darlean Ozell NOVAK, MD  butalbital -acetaminophen -caffeine  (FIORICET ) 50-325-40 MG tablet Take 1 tablet by mouth 2 (two) times daily as needed for headache. (Use for headaches not improved with tylenol ).  Try to avoid using more than twice per week to avoid rebound headaches. 01/23/24   Perri DELENA Meliton Mickey., MD  cetirizine  (ZYRTEC ) 10 MG tablet Take 1 tablet (10 mg total) by mouth daily. 07/14/20   Donah Laymon PARAS, MD  clopidogrel  (PLAVIX ) 75 MG tablet Take 1 tablet (75 mg total) by mouth daily. 01/24/24 04/23/24  Perri DELENA Meliton Mickey., MD  diclofenac  Sodium (VOLTAREN ) 1 % GEL Apply 2 g topically 4 (four) times daily. Patient not taking: Reported on 01/22/2024 05/24/20   Donah Laymon PARAS, MD  famotidine  (PEPCID ) 20 MG tablet TAKE 1 TABLET BY MOUTH AFTER SUPPER 08/29/23   Wert, Michael B, MD  montelukast  (SINGULAIR ) 10 MG tablet Take 1 tablet (10 mg total) by mouth at bedtime. 08/29/23   Wert, Michael B, MD  Multiple Vitamin (MULTIVITAMIN WITH MINERALS) TABS tablet Take 1 tablet by mouth daily.    [provider]  PROAIR  HFA 108 (90 Base) MCG/ACT inhaler INHALE 2 PUFFS BY MOUTH EVERY 4 HOURS AS NEEDED FOR WHEEZING AND FOR SHORTNESS OF BREATH 08/08/20   Donah Laymon PARAS, MD  valsartan (DIOVAN) 40 MG tablet Take 40 mg by mouth daily.    [provider]                                                                                                                                    Past Surgical History Past Surgical History:  Procedure Laterality Date   CESAREAN SECTION     CESAREAN SECTION N/A 10/25/2012   Procedure: CESAREAN SECTION;  Surgeon: Rexene PARAS. Rosalva, MD;  Location: [facility] ORS;  Service: Obstetrics;  Laterality: N/A;   CESAREAN SECTION N/A 03/05/2014   Procedure: CESAREAN SECTION;  Surgeon: Jon CINDERELLA Rummer, MD;  Location: [facility]  ORS;  Service: Obstetrics;  Laterality: N/A;   CESAREAN SECTION WITH BILATERAL TUBAL LIGATION N/A 11/06/2019   Procedure: CESAREAN SECTION WITH BILATERAL TUBAL LIGATION;  Surgeon: Armond Cape, MD;  Location: [facility] LD ORS;  Service: Obstetrics;  Laterality: N/A;   CHOLECYSTECTOMY N/A 04/09/2017   Procedure: LAPAROSCOPIC CHOLECYSTECTOMY;  Surgeon: Rubin Calamity, MD;  Location: [facility] OR;  Service: General;  Laterality: N/A;   FRACTURE SURGERY     left middle finger   Family History Family History  Problem Relation Age of Onset   Hypertension Mother    Allergies Mother    Sleep apnea Father    Hyperlipidemia Father    Allergies Maternal Grandmother    Asthma Maternal Grandmother    Diabetes Maternal Grandmother     Social History Social History[1] Allergies Iohexol , Moxifloxacin, and Other  Review of Systems Review of Systems  Vital Signs  I have reviewed the triage vital signs BP (!) 194/113 (BP Location: Left Arm)   Pulse 75   Temp 98.5 F (36.9 C) (Oral)   Resp 15   LMP 01/01/2024 (Approximate)   SpO2 100%   Physical Exam  ED Results and Treatments Labs (all labs ordered are listed, but only abnormal results are displayed) Labs Reviewed  COMPREHENSIVE METABOLIC PANEL WITH GFR - Abnormal; Notable for the following components:      Result Value   Potassium 3.3 (*)    Glucose, Bld 107 (*)    Total Protein 8.6 (*)    All other components within normal limits  CBC WITH DIFFERENTIAL/PLATELET - Abnormal; Notable for the following components:   MCH 25.5 (*)    All other components within normal limits  MAGNESIUM   Radiology CT Head Wo Contrast Result Date: 01/24/2024 EXAM: CT HEAD WITHOUT CONTRAST 01/24/2024 12:43:09 PM TECHNIQUE: CT of the head was performed without the administration of intravenous contrast. Automated exposure control, iterative  reconstruction, and/or weight based adjustment of the mA/kV was utilized to reduce the radiation dose to as low as reasonably achievable. COMPARISON: 01/21/2024 CLINICAL HISTORY: Headache, increasing frequency or severity FINDINGS: BRAIN AND VENTRICLES: No acute hemorrhage. No evidence of acute infarct. No hydrocephalus. No extra-axial collection. No mass effect or midline shift. ORBITS: No acute abnormality. SINUSES: No acute abnormality. SOFT TISSUES AND SKULL: No acute soft tissue abnormality. No skull fracture. IMPRESSION: 1. No acute intracranial abnormality. Electronically signed by: Donnice Mania MD MD 01/24/2024 01:18 PM EST RP Workstation: [id number]    Pertinent labs & imaging results that were available during my care of the patient were reviewed by me and considered in my medical decision making (see MDM for details).  Medications Ordered in ED Medications  clopidogrel  (PLAVIX ) tablet 75 mg (has no administration in time range)  gabapentin  (NEURONTIN ) capsule 300 mg (300 mg Oral Given 01/24/24 1318)  metoCLOPramide  (REGLAN ) injection 10 mg (10 mg Intravenous Given 01/24/24 1321)  labetalol  (NORMODYNE ) injection 10 mg (10 mg Intravenous Given 01/24/24 1321)  hydrochlorothiazide  (HYDRODIURIL ) tablet 25 mg (25 mg Oral Given 01/24/24 1341)                                                                                                                                     Procedures Procedures  (including critical care time)  Medical Decision Making / ED Course   This patient presents to the ED for concern of headache, this involves an extensive number of treatment options, and is a complaint that carries with it a high risk of complications and morbidity.  The differential diagnosis includes CVA, intracranial dissection, coronary dissection, recent CVA.  MDM: 44 year old female, recently admitted for dissection, discharged.  Here today for continued pain.  Patient without any new neurological  deficits, CT imaging done at triage that was negative.  Patient hypertensive, was able to get her blood pressure control with 1 dose of IV medications followed by some p.o. medications.  Treated her headache with Fioricet , Reglan , gabapentin .  Symptoms were significantly improved.  Patient states she feels much better.  She states that she is so thankful that her headache has resolved.  Will provide patient with prescription for gabapentin .  She had not started taking her Plavix  yet, will provide that with her here today.  Counseled the patient on the importance of taking all of her prescribed medications daily.   Additional history obtained:  -External records from outside source obtained and reviewed including: Chart review including previous notes, labs, imaging, consultation notes   Lab Tests: -I ordered, reviewed, and interpreted labs.   The pertinent results include:   Labs Reviewed  COMPREHENSIVE METABOLIC PANEL WITH GFR - Abnormal; Notable for the following components:  Result Value   Potassium 3.3 (*)    Glucose, Bld 107 (*)    Total Protein 8.6 (*)    All other components within normal limits  CBC WITH DIFFERENTIAL/PLATELET - Abnormal; Notable for the following components:   MCH 25.5 (*)    All other components within normal limits  MAGNESIUM        Imaging Studies ordered: I ordered imaging studies including CT head I independently visualized and interpreted imaging. I agree with the radiologist interpretation   Medicines ordered and prescription drug management: Meds ordered this encounter  Medications   gabapentin  (NEURONTIN ) capsule 300 mg   metoCLOPramide  (REGLAN ) injection 10 mg   labetalol  (NORMODYNE ) injection 10 mg   hydrochlorothiazide  (HYDRODIURIL ) tablet 25 mg   clopidogrel  (PLAVIX ) tablet 75 mg   gabapentin  (NEURONTIN ) 300 MG capsule    Sig: Take 1 capsule (300 mg total) by mouth 3 (three) times daily.    Dispense:  42 capsule    Refill:  0     -I have reviewed the patients home medicines and have made adjustments as needed   Cardiac Monitoring: The patient was maintained on a cardiac monitor.  I personally viewed and interpreted the cardiac monitored which showed an underlying rhythm of: Normal sinus rhythm  Social Determinants of Health:  Factors impacting patients care include: Lack of access to primary care, medication noncompliance   Reevaluation: After the interventions noted above, I reevaluated the patient and found that they have :improved  Co morbidities that complicate the patient evaluation  Past Medical History:  Diagnosis Date   Asthma     reservations to 2-3 timmes  a year that required ED visits/hospitalizations. Never intubated   GERD (gastroesophageal reflux disease)    Seasonal allergies       Dispostion: I considered admission for this patient, however given her reassuring workup, resolution of symptoms she is appropriate for discharge.     Final Clinical Impression(s) / ED Diagnoses Final diagnoses:  Bad headache     @PCDICTATION @      [1]  Social History Tobacco Use   Smoking status: Former    Current packs/day: 0.00    Average packs/day: 0.3 packs/day for 0.5 years (0.2 ttl pk-yrs)    Types: Cigarettes    Start date: 07/16/2000    Quit date: 01/15/2001    Years since quitting: 23.0   Smokeless tobacco: Never  Vaping Use   Vaping status: Never Used  Substance Use Topics   Alcohol use: No    Comment: occasionaly   Drug use: No     Mannie Pac T, DO 01/24/24 1532  "

## 2024-01-24 NOTE — ED Provider Triage Note (Signed)
 Emergency Medicine Provider Triage Evaluation Note  [name] , a 45 y.o. female  was evaluated in triage.  Pt complains of headache that continues since workup at ED yesterday. States that she was diagnosed with mini-stroke. CTA head neck showing possible vertebral artery dissection from yesterday. Tylenol  did help relieve the pain.  GCS 15. Speech is goal oriented. No deficits appreciated to CN III-XII; symmetric eyebrow raise, no facial drooping, tongue midline. Patient has equal grip strength bilaterally with 5/5 strength against resistance in all major muscle groups bilaterally. Sensation to light touch intact. Patient moves extremities without ataxia.   Review of Systems  Positive:  Negative:   Physical Exam  BP (!) 194/113 (BP Location: Left Arm)   Pulse 75   Temp 98.5 F (36.9 C) (Oral)   Resp 15   LMP 01/01/2024 (Approximate)   SpO2 100%  Gen:   Awake, no distress   Resp:  Normal effort  MSK:   Moves extremities without difficulty  Other:    Medical Decision Making  Medically screening exam initiated at 12:11 PM.  Appropriate orders placed.  Man Bonneau Pousson was informed that the remainder of the evaluation will be completed by another provider, this initial triage assessment does not replace that evaluation, and the importance of remaining in the ED until their evaluation is complete.     Hoy Fraction F, [place] 01/24/24 1214

## 2024-01-24 NOTE — Discharge Instructions (Addendum)
 It is very important you take your medications as prescribed each day.  In particular, you need to take your aspirin  and Plavix  each day to help prevent blood clots.    Please take your blood pressure medications each day to prevent future stroke.  I have also sent you an additional medication called hydrochlorothiazide  that I would like you to take each day.    I have also sent you a medicine called gabapentin  that you may take for headache.  You can take 300 mg 3 times per day.  Follow-up with your neurologist and your PCP.  Return to the emergency room if you develop worsening headache that does not improve with the medications you have been prescribed, trip with your vision, difficulty walking, difficulty speaking.

## 2024-01-27 ENCOUNTER — Telehealth: Payer: Self-pay

## 2024-01-27 DIAGNOSIS — I63212 Cerebral infarction due to unspecified occlusion or stenosis of left vertebral arteries: Secondary | ICD-10-CM

## 2024-01-27 NOTE — Transitions of Care (Post Inpatient/ED Visit) (Signed)
 Stroke Discharge Follow-up   01/27/2024 Name:  [name] MRN:  [id number] DOB:  [birth year]  Subjective: [name] is a 44 y.o. year old female who is a primary care patient of Pcp, No An Emmi alert was received indicating patient responded to questions: Questions/problems with meds?. I reached out by phone to follow up on the alert and spoke to Patient. Discussed red flag. She states she knows when and how to take medications now.  She did ask about when to take atorvastatin .  Advised that most people take it in the evening but can be taken at anytime that works for her.  She verbalized understanding.  Discussed follow up appointment.  She has an appointment to establish care on 02/11/24.   Care Management Interventions: Reviewed red flag.  Outreach to patient and addressed red flag.  Reviewed medication and SDOH.  Reviewed stroke symptoms.    Follow up plan: No further intervention required.   [name] J. Arthella Headings RN, MSN [facility], [facility] RN Care Manager Direct Dial: [phone]  Fax: [phone] Website: [email or web address]

## 2024-01-27 NOTE — Patient Instructions (Signed)
 Visit Information  Thank you for taking time to visit with me today. Please don't hesitate to contact me if I can be of assistance to you   Stroke Symptoms Sudden numbness or weakness in the face, arm, or leg, especially on one side of the body. Sudden confusion, trouble speaking, or difficulty understanding speech. Sudden trouble seeing in one or both eyes. Sudden trouble walking, dizziness, loss of balance, or lack of coordination. Sudden severe headache with no known cause. Call 9-1-1 right away if you have any of these symptoms.   Patient verbalizes understanding of instructions and care plan provided today and agrees to view in MyChart. Active MyChart status and patient understanding of how to access instructions and care plan via MyChart confirmed with patient.     The patient has been provided with contact information for the care management team and has been advised to call with any health related questions or concerns.   Please call the care guide team at [phone] if you need to cancel or reschedule your appointment.   Please call the Suicide and Crisis Lifeline: 988 if you are experiencing a Mental Health or Behavioral Health Crisis or need someone to talk to.  Kallyn Demarcus J. Smith Mcnicholas RN, MSN [facility], [facility] RN Care Manager Direct Dial: [phone]  Fax: [phone] Website: [email or web address]

## 2024-01-30 ENCOUNTER — Telehealth (INDEPENDENT_AMBULATORY_CARE_PROVIDER_SITE_OTHER): Payer: Self-pay | Admitting: Primary Care

## 2024-01-30 NOTE — Telephone Encounter (Signed)
 Spoke to pt about upcoming appt.. Will be present

## 2024-02-07 ENCOUNTER — Telehealth: Payer: Self-pay | Admitting: Primary Care

## 2024-02-07 NOTE — Telephone Encounter (Signed)
 Spoke to pt about upcoming appt.. Will be present

## 2024-02-10 ENCOUNTER — Telehealth: Payer: Self-pay

## 2024-02-10 ENCOUNTER — Telehealth: Payer: Self-pay | Admitting: Primary Care

## 2024-02-10 NOTE — Telephone Encounter (Signed)
 Due to inclement weather, our office will be closed tomorrow.02/11/2024 Well reach out to reschedule your appointment. Thank you. [phone] If the pt calls back please resch appt

## 2024-02-10 NOTE — Telephone Encounter (Signed)
 Contacted pt to reschedule appt due to office opening at 10 pt didn't answer no vm set up    If pt calls back please reschedule appt

## 2024-02-11 ENCOUNTER — Ambulatory Visit (INDEPENDENT_AMBULATORY_CARE_PROVIDER_SITE_OTHER): Payer: Self-pay | Admitting: Primary Care

## 2024-02-11 ENCOUNTER — Telehealth (INDEPENDENT_AMBULATORY_CARE_PROVIDER_SITE_OTHER): Payer: Self-pay | Admitting: Primary Care

## 2024-02-11 NOTE — Telephone Encounter (Signed)
 Called pt to sch appt. Pt did not answer and LVM.

## 2024-02-14 NOTE — Telephone Encounter (Signed)
 Kellen please reschedule pt appointment and respond to MyChart message

## 2024-02-28 ENCOUNTER — Ambulatory Visit (INDEPENDENT_AMBULATORY_CARE_PROVIDER_SITE_OTHER): Payer: Self-pay | Admitting: Primary Care

## 2024-03-03 ENCOUNTER — Ambulatory Visit: Payer: Self-pay | Admitting: Primary Care

## 2024-05-12 ENCOUNTER — Ambulatory Visit: Payer: Self-pay | Admitting: Neurology
